# Patient Record
Sex: Male | Born: 1955 | Race: White | Hispanic: No | Smoking: Current every day smoker
Health system: Southern US, Community
[De-identification: ages and names within clinical notes are randomized; demographics above are authoritative.]

## PROBLEM LIST (undated history)

## (undated) DIAGNOSIS — G4730 Sleep apnea, unspecified: Secondary | ICD-10-CM

## (undated) DIAGNOSIS — J449 Chronic obstructive pulmonary disease, unspecified: Secondary | ICD-10-CM

## (undated) DIAGNOSIS — J45909 Unspecified asthma, uncomplicated: Secondary | ICD-10-CM

## (undated) DIAGNOSIS — R011 Cardiac murmur, unspecified: Secondary | ICD-10-CM

## (undated) DIAGNOSIS — I10 Essential (primary) hypertension: Secondary | ICD-10-CM

## (undated) HISTORY — PX: COLONOSCOPY: SHX174

## (undated) HISTORY — PX: KNEE SURGERY: SHX244

## (undated) HISTORY — PX: JOINT REPLACEMENT: SHX530

---

## 2004-12-03 ENCOUNTER — Ambulatory Visit: Payer: Self-pay | Admitting: Occupational Therapy

## 2006-08-26 ENCOUNTER — Encounter: Payer: Self-pay | Admitting: Nurse Practitioner

## 2006-09-12 ENCOUNTER — Encounter: Payer: Self-pay | Admitting: Nurse Practitioner

## 2007-03-01 ENCOUNTER — Ambulatory Visit: Payer: Self-pay | Admitting: Unknown Physician Specialty

## 2007-03-01 ENCOUNTER — Other Ambulatory Visit: Payer: Self-pay

## 2007-03-09 ENCOUNTER — Inpatient Hospital Stay: Payer: Self-pay | Admitting: Unknown Physician Specialty

## 2007-03-25 ENCOUNTER — Encounter: Payer: Self-pay | Admitting: Unknown Physician Specialty

## 2007-04-13 ENCOUNTER — Encounter: Payer: Self-pay | Admitting: Unknown Physician Specialty

## 2007-05-14 ENCOUNTER — Encounter: Payer: Self-pay | Admitting: Unknown Physician Specialty

## 2007-06-14 ENCOUNTER — Encounter: Payer: Self-pay | Admitting: Unknown Physician Specialty

## 2020-10-07 ENCOUNTER — Other Ambulatory Visit: Payer: Self-pay

## 2020-10-07 ENCOUNTER — Observation Stay (HOSPITAL_COMMUNITY)
Admission: EM | Admit: 2020-10-07 | Discharge: 2020-10-08 | Disposition: A | Discharge status: Home / Self Care | Payer: BC Managed Care – PPO | Attending: Internal Medicine | Admitting: Internal Medicine

## 2020-10-07 ENCOUNTER — Emergency Department (HOSPITAL_COMMUNITY): Payer: BC Managed Care – PPO

## 2020-10-07 ENCOUNTER — Encounter (HOSPITAL_COMMUNITY): Payer: Self-pay

## 2020-10-07 DIAGNOSIS — J441 Chronic obstructive pulmonary disease with (acute) exacerbation: Secondary | ICD-10-CM | POA: Diagnosis not present

## 2020-10-07 DIAGNOSIS — J9601 Acute respiratory failure with hypoxia: Secondary | ICD-10-CM | POA: Diagnosis not present

## 2020-10-07 DIAGNOSIS — J45909 Unspecified asthma, uncomplicated: Secondary | ICD-10-CM | POA: Insufficient documentation

## 2020-10-07 DIAGNOSIS — F17210 Nicotine dependence, cigarettes, uncomplicated: Secondary | ICD-10-CM | POA: Insufficient documentation

## 2020-10-07 DIAGNOSIS — J40 Bronchitis, not specified as acute or chronic: Secondary | ICD-10-CM

## 2020-10-07 DIAGNOSIS — J209 Acute bronchitis, unspecified: Secondary | ICD-10-CM

## 2020-10-07 DIAGNOSIS — I82409 Acute embolism and thrombosis of unspecified deep veins of unspecified lower extremity: Secondary | ICD-10-CM | POA: Diagnosis not present

## 2020-10-07 DIAGNOSIS — Z79899 Other long term (current) drug therapy: Secondary | ICD-10-CM | POA: Diagnosis not present

## 2020-10-07 DIAGNOSIS — R059 Cough, unspecified: Secondary | ICD-10-CM | POA: Diagnosis present

## 2020-10-07 DIAGNOSIS — J449 Chronic obstructive pulmonary disease, unspecified: Secondary | ICD-10-CM | POA: Diagnosis present

## 2020-10-07 DIAGNOSIS — J440 Chronic obstructive pulmonary disease with acute lower respiratory infection: Secondary | ICD-10-CM

## 2020-10-07 DIAGNOSIS — I2510 Atherosclerotic heart disease of native coronary artery without angina pectoris: Secondary | ICD-10-CM | POA: Diagnosis not present

## 2020-10-07 DIAGNOSIS — I10 Essential (primary) hypertension: Secondary | ICD-10-CM | POA: Insufficient documentation

## 2020-10-07 DIAGNOSIS — Z20822 Contact with and (suspected) exposure to covid-19: Secondary | ICD-10-CM | POA: Insufficient documentation

## 2020-10-07 HISTORY — DX: Unspecified asthma, uncomplicated: J45.909

## 2020-10-07 HISTORY — DX: Chronic obstructive pulmonary disease, unspecified: J44.9

## 2020-10-07 LAB — BASIC METABOLIC PANEL
Anion gap: 11 (ref 5–15)
BUN: 16 mg/dL (ref 8–23)
CO2: 26 mmol/L (ref 22–32)
Calcium: 9.3 mg/dL (ref 8.9–10.3)
Chloride: 100 mmol/L (ref 98–111)
Creatinine, Ser: 1.16 mg/dL (ref 0.61–1.24)
GFR, Estimated: >60 mL/min (ref >60)
Glucose, Bld: 106 mg/dL — ABNORMAL HIGH (ref 70–99)
Potassium: 4.6 mmol/L (ref 3.5–5.1)
Sodium: 137 mmol/L (ref 135–145)

## 2020-10-07 LAB — CBC WITH DIFFERENTIAL/PLATELET
Abs Immature Granulocytes: 0.15 10*3/uL — ABNORMAL HIGH (ref 0.00–0.07)
Basophils Absolute: 0.1 10*3/uL (ref 0.0–0.1)
Basophils Relative: 0 %
Eosinophils Absolute: 0 10*3/uL (ref 0.0–0.5)
Eosinophils Relative: 0 %
HCT: 43 % (ref 39.0–52.0)
Hemoglobin: 14.1 g/dL (ref 13.0–17.0)
Immature Granulocytes: 1 %
Lymphocytes Relative: 10 %
Lymphs Abs: 2 10*3/uL (ref 0.7–4.0)
MCH: 32.1 pg (ref 26.0–34.0)
MCHC: 32.8 g/dL (ref 30.0–36.0)
MCV: 97.9 fL (ref 80.0–100.0)
Monocytes Absolute: 2.3 10*3/uL — ABNORMAL HIGH (ref 0.1–1.0)
Monocytes Relative: 11 %
Neutro Abs: 15.7 10*3/uL — ABNORMAL HIGH (ref 1.7–7.7)
Neutrophils Relative %: 78 %
Platelets: 425 10*3/uL — ABNORMAL HIGH (ref 150–400)
RBC: 4.39 MIL/uL (ref 4.22–5.81)
RDW: 12.8 % (ref 11.5–15.5)
WBC: 20.2 10*3/uL — ABNORMAL HIGH (ref 4.0–10.5)
nRBC: 0 % (ref 0.0–0.2)

## 2020-10-07 LAB — RESP PANEL BY RT-PCR (FLU A&B, COVID) ARPGX2
Influenza A by PCR: NEGATIVE
Influenza B by PCR: NEGATIVE
SARS Coronavirus 2 by RT PCR: NEGATIVE

## 2020-10-07 LAB — TROPONIN I (HIGH SENSITIVITY)
Troponin I (High Sensitivity): 11 ng/L (ref <18)
Troponin I (High Sensitivity): 11 ng/L (ref <18)

## 2020-10-07 MED ORDER — GUAIFENESIN ER 600 MG PO TB12
1200.0000 mg | ORAL_TABLET | Freq: Two times a day (BID) | ORAL | Status: DC
  Administered 2020-10-07 – 2020-10-08 (×2): 1200 mg via ORAL
  Filled 2020-10-07 (×2): qty 2

## 2020-10-07 MED ORDER — BENAZEPRIL HCL 10 MG PO TABS
40.0000 mg | ORAL_TABLET | Freq: Every day | ORAL | Status: DC
  Administered 2020-10-08: 40 mg via ORAL
  Filled 2020-10-07 (×2): qty 2
  Filled 2020-10-07: qty 4
  Filled 2020-10-07: qty 2

## 2020-10-07 MED ORDER — HYDROCODONE-ACETAMINOPHEN 5-325 MG PO TABS
2.0000 | ORAL_TABLET | Freq: Four times a day (QID) | ORAL | Status: DC | PRN
  Administered 2020-10-07: 2 via ORAL
  Filled 2020-10-07: qty 2

## 2020-10-07 MED ORDER — ALBUTEROL SULFATE HFA 108 (90 BASE) MCG/ACT IN AERS
2.0000 | INHALATION_SPRAY | Freq: Once | RESPIRATORY_TRACT | Status: AC
  Administered 2020-10-07: 2 via RESPIRATORY_TRACT
  Filled 2020-10-07: qty 6.7

## 2020-10-07 MED ORDER — DIAZEPAM 2 MG PO TABS: 2.0000 mg | ORAL_TABLET | Freq: Two times a day (BID) | ORAL | Status: DC | PRN

## 2020-10-07 MED ORDER — PREDNISONE 20 MG PO TABS
40.0000 mg | ORAL_TABLET | Freq: Every day | ORAL | Status: DC
  Administered 2020-10-08: 40 mg via ORAL
  Filled 2020-10-07: qty 2

## 2020-10-07 MED ORDER — ROPINIROLE HCL 0.5 MG PO TABS: 0.5000 mg | ORAL_TABLET | Freq: Every day | ORAL | Status: DC

## 2020-10-07 MED ORDER — IPRATROPIUM-ALBUTEROL 0.5-2.5 (3) MG/3ML IN SOLN
3.0000 mL | Freq: Four times a day (QID) | RESPIRATORY_TRACT | Status: DC
  Administered 2020-10-08: 3 mL via RESPIRATORY_TRACT
  Filled 2020-10-07: qty 3

## 2020-10-07 MED ORDER — ALBUTEROL SULFATE (2.5 MG/3ML) 0.083% IN NEBU: 2.5000 mg | INHALATION_SOLUTION | RESPIRATORY_TRACT | Status: DC | PRN

## 2020-10-07 MED ORDER — DILTIAZEM HCL ER COATED BEADS 300 MG PO CP24
300.0000 mg | ORAL_CAPSULE | Freq: Every day | ORAL | Status: DC
  Administered 2020-10-08: 300 mg via ORAL
  Filled 2020-10-07 (×3): qty 1

## 2020-10-07 MED ORDER — METHYLPREDNISOLONE SODIUM SUCC 125 MG IJ SOLR
125.0000 mg | Freq: Once | INTRAMUSCULAR | Status: AC
  Administered 2020-10-07: 125 mg via INTRAVENOUS
  Filled 2020-10-07: qty 2

## 2020-10-07 MED ORDER — ATORVASTATIN CALCIUM 40 MG PO TABS
40.0000 mg | ORAL_TABLET | Freq: Every day | ORAL | Status: DC
  Administered 2020-10-08: 40 mg via ORAL
  Filled 2020-10-07: qty 1

## 2020-10-07 MED ORDER — NAPROXEN SODIUM 220 MG PO TABS: 440.0000 mg | ORAL_TABLET | Freq: Every day | ORAL | Status: DC | PRN

## 2020-10-07 MED ORDER — HYDROCHLOROTHIAZIDE 25 MG PO TABS
25.0000 mg | ORAL_TABLET | Freq: Every day | ORAL | Status: DC
  Administered 2020-10-08: 25 mg via ORAL
  Filled 2020-10-07: qty 1

## 2020-10-07 MED ORDER — IPRATROPIUM-ALBUTEROL 0.5-2.5 (3) MG/3ML IN SOLN
3.0000 mL | Freq: Once | RESPIRATORY_TRACT | Status: AC
  Administered 2020-10-07: 3 mL via RESPIRATORY_TRACT
  Filled 2020-10-07: qty 3

## 2020-10-07 MED ORDER — SODIUM CHLORIDE 0.9 % IV SOLN: 2.0000 g | INTRAVENOUS | Status: DC

## 2020-10-07 MED ORDER — SODIUM CHLORIDE 0.9 % IV SOLN
1.0000 g | Freq: Once | INTRAVENOUS | Status: DC
  Administered 2020-10-07: 1 g via INTRAVENOUS
  Filled 2020-10-07: qty 10

## 2020-10-07 MED ORDER — ENOXAPARIN SODIUM 60 MG/0.6ML ~~LOC~~ SOLN
50.0000 mg | SUBCUTANEOUS | Status: DC
  Administered 2020-10-07: 50 mg via SUBCUTANEOUS
  Filled 2020-10-07: qty 0.6

## 2020-10-07 MED ORDER — HYDROCORTISONE 2.5 % EX CREA
TOPICAL_CREAM | Freq: Two times a day (BID) | CUTANEOUS | Status: DC
  Filled 2020-10-07: qty 30

## 2020-10-07 MED ORDER — IOHEXOL 350 MG/ML SOLN
75.0000 mL | Freq: Once | INTRAVENOUS | Status: AC | PRN
  Administered 2020-10-07: 100 mL via INTRAVENOUS

## 2020-10-07 MED ORDER — CYCLOBENZAPRINE HCL 10 MG PO TABS: 5.0000 mg | ORAL_TABLET | Freq: Three times a day (TID) | ORAL | Status: DC | PRN

## 2020-10-07 MED ORDER — SODIUM CHLORIDE 0.9 % IV SOLN: 500.0000 mg | INTRAVENOUS | Status: DC

## 2020-10-07 MED ORDER — AZITHROMYCIN 250 MG PO TABS: 500.0000 mg | ORAL_TABLET | Freq: Every day | ORAL | Status: DC

## 2020-10-07 MED ORDER — FLUTICASONE FUROATE-VILANTEROL 100-25 MCG/INH IN AEPB
1.0000 | INHALATION_SPRAY | Freq: Every day | RESPIRATORY_TRACT | Status: DC
  Administered 2020-10-08: 1 via RESPIRATORY_TRACT
  Filled 2020-10-07: qty 28

## 2020-10-07 MED ORDER — SODIUM CHLORIDE 0.9 % IV SOLN
500.0000 mg | INTRAVENOUS | Status: DC
  Administered 2020-10-07: 500 mg via INTRAVENOUS
  Filled 2020-10-07: qty 500

## 2020-10-07 MED ORDER — TRAZODONE HCL 50 MG PO TABS: 50.0000 mg | ORAL_TABLET | Freq: Every day | ORAL | Status: DC

## 2020-10-07 MED ORDER — NAPROXEN 250 MG PO TABS: 500.0000 mg | ORAL_TABLET | Freq: Every day | ORAL | Status: DC | PRN

## 2020-10-07 MED ORDER — ROPINIROLE HCL 0.25 MG PO TABS
0.2500 mg | ORAL_TABLET | Freq: Every day | ORAL | Status: DC
  Administered 2020-10-07: 0.25 mg via ORAL
  Filled 2020-10-07: qty 1

## 2020-10-07 MED ORDER — DOXAZOSIN MESYLATE 2 MG PO TABS
2.0000 mg | ORAL_TABLET | Freq: Every day | ORAL | Status: DC
  Administered 2020-10-08: 2 mg via ORAL
  Filled 2020-10-07: qty 1

## 2020-10-07 MED ORDER — HYDRALAZINE HCL 25 MG PO TABS
25.0000 mg | ORAL_TABLET | Freq: Four times a day (QID) | ORAL | Status: DC | PRN
Start: 2020-10-07 — End: 2020-10-08

## 2020-10-07 NOTE — ED Notes (Signed)
Bed marked ready  Call for report  "that bed hasn't been approved yet" And person hung up

## 2020-10-07 NOTE — ED Provider Notes (Signed)
[facility] Provider Note   CSN: [id number] Arrival date & time: 10/07/20  1001     History Chief Complaint  Patient presents with  . Cough    David GamerDonald L Mian is a 64 y.o. male with past history significant for COPD who presents for evaluation of cough and feeling unwell.  Symptoms began on Wednesday.  States this was also when he got his Covid vaccine booster.  Has had productive cough.  He feels short of breath when he coughs.  Generalized myalgias.  Denies headache, lightheadedness, dizziness, chest pain, hemoptysis abdominal pain, diarrhea, dysuria, lateral leg swelling, redness or warmth.  Denies additional aggravating or alleviating factors.  History obtained from patient and past medical records.  No interpreter used  HPI     Past Medical History:  Diagnosis Date  . Asthma   . COPD (chronic obstructive pulmonary disease) [facility](HCC)     Patient Active Problem List   Diagnosis Date Noted  . COPD with acute bronchitis (HCC) 10/07/2020  . COPD (chronic obstructive pulmonary disease) (HCC) 10/07/2020  . Bronchitis   . Acute respiratory failure with hypoxia (HCC)     History reviewed. No pertinent surgical history.     History reviewed. No pertinent family history.  Social History   Tobacco Use  . Smoking status: Current Every Day Smoker    Packs/day: 2.00    Types: Cigarettes  . Smokeless tobacco: Never Used  Vaping Use  . Vaping Use: Never used  Substance Use Topics  . Alcohol use: Not Currently  . Drug use: Never    Home Medications Prior to Admission medications   Medication Sig Start Date End Date Taking? Authorizing Provider  albuterol (VENTOLIN HFA) 108 (90 Base) MCG/ACT inhaler Inhale 2 puffs into the lungs every 6 (six) hours as needed for shortness of breath.   Yes [provider]  atorvastatin (LIPITOR) 40 MG tablet Take 40 mg by mouth daily.   Yes [provider]  benazepril (LOTENSIN) 40 MG tablet Take 40 mg  by mouth daily. 09/18/20  Yes [provider]  budesonide-formoterol (SYMBICORT) 160-4.5 MCG/ACT inhaler Inhale 2 puffs into the lungs daily as needed (short of breath).   Yes [provider]  cyclobenzaprine (FLEXERIL) 10 MG tablet Take 10 mg by mouth 3 (three) times daily. 09/17/20  Yes [provider]  diltiazem (CARDIZEM CD) 300 MG 24 hr capsule Take 300 mg by mouth daily. 09/18/20  Yes [provider]  hydrochlorothiazide (HYDRODIURIL) 25 MG tablet Take 25 mg by mouth daily.   Yes [provider]  HYDROcodone-acetaminophen (NORCO/VICODIN) 5-325 MG tablet Take 2 tablets by mouth every 6 (six) hours as needed for moderate pain or severe pain. 08/17/20  Yes [provider]  hydrocortisone 2.5 % cream  03/09/18  Yes [provider]  naproxen sodium (ALEVE) 220 MG tablet Take 440 mg by mouth daily as needed.   Yes [provider]  rOPINIRole (REQUIP) 0.25 MG tablet Take 0.75 mg by mouth at bedtime. 09/18/20  Yes [provider]  terazosin (HYTRIN) 10 MG capsule Take 10 mg by mouth daily. 09/26/20  Yes [provider]  traZODone (DESYREL) 50 MG tablet Take 50 mg by mouth at bedtime. 09/26/20  Yes [provider]  Vitamin D, Ergocalciferol, (DRISDOL) 1.25 MG (50000 UNIT) CAPS capsule Take 50,000 Units by mouth 2 (two) times a week. 06/02/20  Yes [provider]  furosemide (LASIX) 20 MG tablet     [provider]  Allergies    Motrin [ibuprofen]  Review of Systems   Review of Systems  Constitutional: Positive for activity change, appetite change and fatigue.  HENT: Negative.   Respiratory: Positive for cough and shortness of breath. Negative for apnea, choking, chest tightness, wheezing and stridor.   Cardiovascular: Negative.   Gastrointestinal: Negative.   Genitourinary: Negative.   Musculoskeletal: Negative.   Skin: Negative.   Neurological: Negative.   All other systems  reviewed and are negative.   Physical Exam Updated Vital Signs BP (!) 164/94 (BP Location: Right Arm)   Pulse 93   Temp 100.1 F (37.8 C) (Oral)   Resp 20   Ht 5' 10.5" (1.791 m)   Wt 106.6 kg   SpO2 94%   BMI 33.24 kg/m   Physical Exam Vitals and nursing note reviewed.  Constitutional:      General: He is not in acute distress.    Appearance: He is not ill-appearing, toxic-appearing or diaphoretic.  HENT:     Head: Normocephalic and atraumatic.     Jaw: There is normal jaw occlusion.     Right Ear: Tympanic membrane, ear canal and external ear normal. There is no impacted cerumen. No hemotympanum. Tympanic membrane is not injected, scarred, perforated, erythematous, retracted or bulging.     Left Ear: Tympanic membrane, ear canal and external ear normal. There is no impacted cerumen. No hemotympanum. Tympanic membrane is not injected, scarred, perforated, erythematous, retracted or bulging.     Ears:     Comments: No Mastoid tenderness.    Nose:     Comments: Clear rhinorrhea and congestion to bilateral nares.  No sinus tenderness.    Mouth/Throat:     Comments: Posterior oropharynx clear.  Mucous membranes moist.  Tonsils without erythema or exudate.  Uvula midline without deviation.  No evidence of PTA or RPA.  No drooling, dysphasia or trismus.  Phonation normal. Neck:     Trachea: Trachea and phonation normal.     Comments: No Neck stiffness or neck rigidity.  No meningismus.  No cervical lymphadenopathy. Cardiovascular:     Rate and Rhythm: Normal rate.     Pulses: Normal pulses.          Radial pulses are 2+ on the right side and 2+ on the left side.     Heart sounds: Normal heart sounds.     Comments: No murmurs rubs or gallops. Pulmonary:     Effort: Pulmonary effort is normal. No accessory muscle usage.     Breath sounds: Wheezing and rhonchi present.     Comments: Coarse breath sounds bilaterally with rhonchi and wheeze no accessory muscle usage.  Able speak in  full sentences. Chest:     Comments: Equal rise and fall to chest wall Abdominal:     Comments: Soft, nontender without rebound or guarding.  No CVA tenderness.  Musculoskeletal:     Cervical back: Full passive range of motion without pain and normal range of motion.     Comments: Moves all 4 extremities without difficulty.  Lower extremities without edema, erythema or warmth.  Skin:    Comments: Brisk capillary refill.  No rashes or lesions.  Neurological:     Mental Status: He is alert.     Comments: Ambulatory in department without difficulty.  Cranial nerves II through XII grossly intact.  No facial droop.  No aphasia.     ED Results / Procedures / Treatments   Labs (all labs ordered are listed, but only abnormal  results are displayed) Labs Reviewed  CBC WITH DIFFERENTIAL/PLATELET - Abnormal; Notable for the following components:      Result Value   WBC 20.2 (*)    Platelets 425 (*)    Neutro Abs 15.7 (*)    Monocytes Absolute 2.3 (*)    Abs Immature Granulocytes 0.15 (*)    All other components within normal limits  BASIC METABOLIC PANEL - Abnormal; Notable for the following components:   Glucose, Bld 106 (*)    All other components within normal limits  RESP PANEL BY RT-PCR (FLU A&B, COVID) ARPGX2  EXPECTORATED SPUTUM ASSESSMENT W REFEX TO RESP CULTURE  BLOOD GAS, VENOUS  BASIC METABOLIC PANEL  CBC  LEGIONELLA PNEUMOPHILA SEROGP 1 UR AG  MYCOPLASMA PNEUMONIAE ANTIBODY, IGM  STREP PNEUMONIAE URINARY ANTIGEN  RAPID URINE DRUG SCREEN, HOSP PERFORMED  TROPONIN I (HIGH SENSITIVITY)  TROPONIN I (HIGH SENSITIVITY)    EKG EKG Interpretation  Date/Time:  Sunday October 07 2020 14:52:30 EST Ventricular Rate:  103 PR Interval:    QRS Duration: 94 QT Interval:  359 QTC Calculation: 425 R Axis:   60 Text Interpretation: Sinus tachycardia Multiple premature complexes, vent & supraven Anteroseptal infarct, old Borderline repolarization abnormality Confirmed by Kennis Carina [phone]) on 10/07/2020 3:00:00 PM   Radiology CT Angio Chest PE W/Cm &/Or Wo Cm  Result Date: 10/07/2020 CLINICAL DATA:  Cough, COPD EXAM: CT ANGIOGRAPHY CHEST WITH CONTRAST TECHNIQUE: Multidetector CT imaging of the chest was performed using the standard protocol during bolus administration of intravenous contrast. Multiplanar CT image reconstructions and MIPs were obtained to evaluate the vascular anatomy. CONTRAST:  OMNIPAQUE IOHEXOL 350 MG/ML SOLN COMPARISON:  None. FINDINGS: Cardiovascular: Examination for pulmonary embolism is somewhat limited by breath motion artifact throughout. Within this limitation, no evidence of pulmonary embolism to the proximal segmental pulmonary arterial level. Normal heart size. Three-vessel coronary artery calcifications. No pericardial effusion. Aortic atherosclerosis. Mediastinum/Nodes: No enlarged mediastinal, hilar, or axillary lymph nodes. Thyroid gland, trachea, and esophagus demonstrate no significant findings. Lungs/Pleura: Examination of the lungs is generally limited by breath motion artifact. Diffuse bilateral bronchial wall thickening. No pleural effusion or pneumothorax. Upper Abdomen: No acute abnormality. Musculoskeletal: No chest wall abnormality. No acute or significant osseous findings. Review of the MIP images confirms the above findings. IMPRESSION: 1. Examination for pulmonary embolism is somewhat limited by breath motion artifact throughout. Within this limitation, no evidence of pulmonary embolism to the proximal segmental pulmonary arterial level. 2. Diffuse bilateral bronchial wall thickening, consistent with nonspecific infectious or inflammatory bronchitis. 3. Coronary artery disease. Aortic Atherosclerosis (ICD10-I70.0). Electronically Signed   By: Lauralyn Primes M.D.   On: 10/07/2020 16:38   DG Chest Portable 1 View  Result Date: 10/07/2020 CLINICAL DATA:  Cough following COVID booster EXAM: PORTABLE CHEST 1 VIEW COMPARISON:   03/01/2007 FINDINGS: Cardiac shadow is within normal limits. Changes of prior granulomatous disease are seen. Lungs are well aerated without focal infiltrate or sizable effusion. No bony abnormality is seen. IMPRESSION: No active disease. Electronically Signed   By: Alcide Clever M.D.   On: 10/07/2020 13:35   Procedures Procedures (including critical care time)  Medications Ordered in ED Medications  azithromycin (ZITHROMAX) 500 mg in sodium chloride 0.9 % 250 mL IVPB (0 mg Intravenous Stopped 10/07/20 1946)  benazepril (LOTENSIN) tablet 40 mg (40 mg Oral Not Given 10/07/20 1829)  diazepam (VALIUM) tablet 2 mg (has no administration in time range)  cyclobenzaprine (FLEXERIL) tablet 5 mg (has no administration in time range)  doxazosin (CARDURA) tablet 2 mg (2 mg Oral Not Given 10/07/20 2102)  traZODone (DESYREL) tablet 50 mg (has no administration in time range)  atorvastatin (LIPITOR) tablet 40 mg (40 mg Oral Not Given 10/07/20 1830)  hydrochlorothiazide (HYDRODIURIL) tablet 25 mg (25 mg Oral Not Given 10/07/20 1830)  predniSONE (DELTASONE) tablet 40 mg (has no administration in time range)  fluticasone furoate-vilanterol (BREO ELLIPTA) 100-25 MCG/INH 1 puff (has no administration in time range)  ipratropium-albuterol (DUONEB) 0.5-2.5 (3) MG/3ML nebulizer solution 3 mL (has no administration in time range)  albuterol (PROVENTIL) (2.5 MG/3ML) 0.083% nebulizer solution 2.5 mg (has no administration in time range)  enoxaparin (LOVENOX) injection 50 mg (50 mg Subcutaneous Given 10/07/20 1912)  hydrALAZINE (APRESOLINE) tablet 25 mg (has no administration in time range)  guaiFENesin (MUCINEX) 12 hr tablet 1,200 mg (has no administration in time range)  cefTRIAXone (ROCEPHIN) 2 g in sodium chloride 0.9 % 100 mL IVPB (has no administration in time range)  rOPINIRole (REQUIP) tablet 0.25 mg (has no administration in time range)  HYDROcodone-acetaminophen (NORCO/VICODIN) 5-325 MG per tablet 2 tablet  (has no administration in time range)  naproxen sodium (ALEVE) tablet 440 mg (has no administration in time range)  diltiazem (CARDIZEM CD) 24 hr capsule 300 mg (has no administration in time range)  hydrocortisone 2.5 % cream (has no administration in time range)  albuterol (VENTOLIN HFA) 108 (90 Base) MCG/ACT inhaler 2 puff (2 puffs Inhalation Given 10/07/20 1459)  methylPREDNISolone sodium succinate (SOLU-MEDROL) 125 mg/2 mL injection 125 mg (125 mg Intravenous Given 10/07/20 1504)  iohexol (OMNIPAQUE) 350 MG/ML injection 75 mL (100 mLs Intravenous Contrast Given 10/07/20 1623)  ipratropium-albuterol (DUONEB) 0.5-2.5 (3) MG/3ML nebulizer solution 3 mL (3 mLs Nebulization Given 10/07/20 1744)    ED Course  I have reviewed the triage vital signs and the nursing notes.  Pertinent labs & imaging results that were available during my care of the patient were reviewed by me and considered in my medical decision making (see chart for details).  Patient presents for evaluation after feeling unwell after getting Covid booster on Wednesday.  On arrival he is afebrile, nonseptic, non-ill-appearing.  Coarse breath sounds with wheeze and rhonchi.  Heart clear.  Abdomen soft, nontender.  Appears well-hydrated.  No unilateral leg swelling, redness or warmth.  Does have remote history of tobacco use, history of COPD.  Plan on labs, imaging and reassess.  Will give steroids, albuterol  Labs and imaging personally reviewed and interpreted:  COVID, Flu negative Chest x-ray without any infiltrates, cardiomegaly, pulmonary edema, pneumothorax CBC with leukocytosis at 20.2 Trop 11>>11 BMP mild hyperglycemia 106 however no additional electrolyte, renal or liver abnormality  Patient reassessed.  Patient dropped to 86% on room air while ambulating short distances.  Has increasing dyspnea.  This is despite steroids as well as multiple albuterol treatments.  Placed DuoNeb orders as Covid is negative.  Given he does  have a white count of 20 and CT scan of his chest does show possible infectious bronchitis will start on antibiotics.  Discussed with patient family in room admission for continued management.  They are agreeable to this  CONSULT with Dr. Chipper Herb with [facility] who will evaluate patient for admission.  The patient appears reasonably stabilized for admission considering the current resources, flow, and capabilities available in the ED at this time, and I doubt any other [facility] requiring further screening and/or treatment in the ED prior to admission.     MDM Rules/Calculators/A&P  [facility] was evaluated in Emergency Department on 10/07/2020 for the symptoms described in the history of present illness. He was evaluated in the context of the global COVID-19 pandemic, which necessitated consideration that the patient might be at risk for infection with the SARS-CoV-2 virus that causes COVID-19. Institutional protocols and algorithms that pertain to the evaluation of patients at risk for COVID-19 are in a state of rapid change based on information released by regulatory bodies including the CDC and federal and state organizations. These policies and algorithms were followed during the patient's care in the ED. Final Clinical Impression(s) / ED Diagnoses Final diagnoses:  Bronchitis  Acute respiratory failure with hypoxia [facility])    Rx / DC Orders ED Discharge Orders    None       Michie Molnar A, PA-C 10/07/20 2144    Vanetta Mulders, MD 10/20/20 2354

## 2020-10-07 NOTE — ED Notes (Signed)
Cough since Weds after having covid vaccine

## 2020-10-07 NOTE — ED Notes (Signed)
Call to [facility] , Almira Coaster, RN  She reports that she will request that this pt be next assigned a bed since it has been requested that pt be moved to Zone A for hours due to FT acuity, covid patients in most rooms and inability to have pt moved to zone a in spite of repeated request

## 2020-10-07 NOTE — ED Notes (Signed)
Pt on O2/2L  He is not on O2 at home   Daughter at bedside

## 2020-10-07 NOTE — ED Notes (Signed)
Report to Bree, RN 

## 2020-10-07 NOTE — ED Notes (Signed)
Call to resp   No answer

## 2020-10-07 NOTE — ED Triage Notes (Signed)
Pt to er, pt states that he got his booster shot on Monday, states that since then he has felt poorly, states that he started having a cough wednesday.

## 2020-10-07 NOTE — ED Notes (Signed)
No meds evidently verified by pharm at this time   Awaiting 1800 meds

## 2020-10-07 NOTE — H&P (Signed)
History and Physical    David Callahan ZOX:[id number] DOB: Jul 12, 1956 DOA: 10/07/2020  PCP: [facility] (Confirm with patient/family/NH records and if not entered, this has to be entered at [facility] point of entry) Patient coming from: Home  I have personally briefly reviewed patient's old medical records in [facility]  Chief Complaint: Wheezing, shortness of breath  HPI: David Callahan is a 64 y.o. male with medical history significant of COPD/asthma, HTN, HLD, BPH, restless leg syndrome, anxiety, presented with increasing shortness of breath.  Patient went to have posterior injection of Covid vaccine on Monday, and then started on Wednesday, he started to feel increasing shortness breath and wheezing.  Occasional whitish sputum coughed up, no fever chills.  No chest pains.  Went to see his PCP on Wednesday, who did outpatient chest x-ray and patient was told negative for pneumonia, and patient sent home without further intervention.  Patient been using more more albuterol pump for last 2 days with minimum help.  Denied any sick contact, other family member healthy.  Also patient reported he quit smoke " cold [place]" 2 weeks ago. ED Course: Chest x-ray negative, CT angiogram negative for PE but diffuse bronchitis-like changes. WBC 20. Review of Systems: As per HPI otherwise 14 point review of systems negative.    Past Medical History:  Diagnosis Date  . Asthma   . COPD (chronic obstructive pulmonary disease) (HCC)     History reviewed. No pertinent surgical history.   reports that he has been smoking cigarettes. He has been smoking about 2.00 packs per day. He has never used smokeless tobacco. He reports previous alcohol use. He reports that he does not use drugs.  Allergies  Allergen Reactions  . Motrin [Ibuprofen] Swelling    History reviewed. No pertinent family history.   Prior to Admission medications   Not on File    Physical Exam: Vitals:    10/07/20 1600 10/07/20 1615 10/07/20 1630 10/07/20 1744  BP: (!) 159/100  (!) 195/101   Pulse: (!) 105 90 96   Resp: 16 (!) 25 19   Temp:      TempSrc:      SpO2: (!) 83% 92% 91% 90%  Weight:      Height:        Constitutional: NAD, calm, comfortable Vitals:   10/07/20 1600 10/07/20 1615 10/07/20 1630 10/07/20 1744  BP: (!) 159/100  (!) 195/101   Pulse: (!) 105 90 96   Resp: 16 (!) 25 19   Temp:      TempSrc:      SpO2: (!) 83% 92% 91% 90%  Weight:      Height:       Eyes: PERRL, lids and conjunctivae normal ENMT: Mucous membranes are moist. Posterior pharynx clear of any exudate or lesions.Normal dentition.  Neck: normal, supple, no masses, no thyromegaly Respiratory: Congested breathing sound bilaterally, diffused wheezing and occasional crackles.  Increasing respiratory effort, talking in broken sentences. No accessory muscle use.  Cardiovascular: Regular rate and rhythm, no murmurs / rubs / gallops. No extremity edema. 2+ pedal pulses. No carotid bruits.  Abdomen: no tenderness, no masses palpated. No hepatosplenomegaly. Bowel sounds positive.  Musculoskeletal: no clubbing / cyanosis. No joint deformity upper and lower extremities. Good ROM, no contractures. Normal muscle tone.  Skin: no rashes, lesions, ulcers. No induration Neurologic: CN 2-12 grossly intact. Sensation intact, DTR normal. Strength 5/5 in all 4.  Psychiatric: Normal judgment and insight. Alert and  oriented x 3. Normal mood.     Labs on Admission: I have personally reviewed following labs and imaging studies  CBC: Recent Labs  Lab 10/07/20 1430  WBC 20.2*  NEUTROABS 15.7*  HGB 14.1  HCT 43.0  MCV 97.9  PLT 425*   Basic Metabolic Panel: Recent Labs  Lab 10/07/20 1430  NA 137  K 4.6  CL 100  CO2 26  GLUCOSE 106*  BUN 16  CREATININE 1.16  CALCIUM 9.3   GFR: Estimated Creatinine Clearance: 80.4 mL/min (by C-G formula based on SCr of 1.16 mg/dL). Liver Function Tests: No results for  input(s): AST, ALT, ALKPHOS, BILITOT, PROT, ALBUMIN in the last 168 hours. No results for input(s): LIPASE, AMYLASE in the last 168 hours. No results for input(s): AMMONIA in the last 168 hours. Coagulation Profile: No results for input(s): INR, PROTIME in the last 168 hours. Cardiac Enzymes: No results for input(s): CKTOTAL, CKMB, CKMBINDEX, TROPONINI in the last 168 hours. BNP (last 3 results) No results for input(s): PROBNP in the last 8760 hours. HbA1C: No results for input(s): HGBA1C in the last 72 hours. CBG: No results for input(s): GLUCAP in the last 168 hours. Lipid Profile: No results for input(s): CHOL, HDL, LDLCALC, TRIG, CHOLHDL, LDLDIRECT in the last 72 hours. Thyroid Function Tests: No results for input(s): TSH, T4TOTAL, FREET4, T3FREE, THYROIDAB in the last 72 hours. Anemia Panel: No results for input(s): VITAMINB12, FOLATE, FERRITIN, TIBC, IRON, RETICCTPCT in the last 72 hours. Urine analysis: No results found for: COLORURINE, APPEARANCEUR, LABSPEC, PHURINE, GLUCOSEU, HGBUR, BILIRUBINUR, KETONESUR, PROTEINUR, UROBILINOGEN, NITRITE, LEUKOCYTESUR  Radiological Exams on Admission: CT Angio Chest PE W/Cm &/Or Wo Cm  Result Date: 10/07/2020 CLINICAL DATA:  Cough, COPD EXAM: CT ANGIOGRAPHY CHEST WITH CONTRAST TECHNIQUE: Multidetector CT imaging of the chest was performed using the standard protocol during bolus administration of intravenous contrast. Multiplanar CT image reconstructions and MIPs were obtained to evaluate the vascular anatomy. CONTRAST:  OMNIPAQUE IOHEXOL 350 MG/ML SOLN COMPARISON:  None. FINDINGS: Cardiovascular: Examination for pulmonary embolism is somewhat limited by breath motion artifact throughout. Within this limitation, no evidence of pulmonary embolism to the proximal segmental pulmonary arterial level. Normal heart size. Three-vessel coronary artery calcifications. No pericardial effusion. Aortic atherosclerosis. Mediastinum/Nodes: No enlarged  mediastinal, hilar, or axillary lymph nodes. Thyroid gland, trachea, and esophagus demonstrate no significant findings. Lungs/Pleura: Examination of the lungs is generally limited by breath motion artifact. Diffuse bilateral bronchial wall thickening. No pleural effusion or pneumothorax. Upper Abdomen: No acute abnormality. Musculoskeletal: No chest wall abnormality. No acute or significant osseous findings. Review of the MIP images confirms the above findings. IMPRESSION: 1. Examination for pulmonary embolism is somewhat limited by breath motion artifact throughout. Within this limitation, no evidence of pulmonary embolism to the proximal segmental pulmonary arterial level. 2. Diffuse bilateral bronchial wall thickening, consistent with nonspecific infectious or inflammatory bronchitis. 3. Coronary artery disease. Aortic Atherosclerosis (ICD10-I70.0). Electronically Signed   By: Lauralyn Primes M.D.   On: 10/07/2020 16:38   DG Chest Portable 1 View  Result Date: 10/07/2020 CLINICAL DATA:  Cough following COVID booster EXAM: PORTABLE CHEST 1 VIEW COMPARISON:  03/01/2007 FINDINGS: Cardiac shadow is within normal limits. Changes of prior granulomatous disease are seen. Lungs are well aerated without focal infiltrate or sizable effusion. No bony abnormality is seen. IMPRESSION: No active disease. Electronically Signed   By: Alcide Clever M.D.   On: 10/07/2020 13:35    EKG: Independently reviewed.  Sinus with frequent PACs  Assessment/Plan Active  Problems:   COPD with acute bronchitis (HCC)   COPD (chronic obstructive pulmonary disease) (HCC)  (please populate well all problems here in Problem List. (For example, if patient is on BP meds at home and you resume or decide to hold them, it is a problem that needs to be her. Same for CAD, COPD, HLD and so on)  CAP -Start ceftriaxone and Zithromax -Repeat CBC tomorrow, check strep antigen, mycoplasma and Legionella.  Acute COPD exacerbation -Antibiotics as  above -Inhaled steroid and short course p.o. steroid -Continue home breathing meds, plus DuoNeb and Advair -Outpatient lung function test.  Outpatient sleep study  Acute hypoxic respite failure -Secondary to CAP and COPD exacerbation -Repeat ambulatory pulse ox tomorrow if stable consider discharge home on p.o. antibiotics and additional breathing meds.  Uncontrolled hypertension -Restart home BP meds -As needed hydralazine -UDS  CAD -Accidental finding on CT angiogram today showed multiple calcification of coronary arteries, recommend outpatient stress test.  Depression/anxiety -Cut down Valium dosage, continue trazodone  DVT prophylaxis: Lovenox  code Status: Full code Family Communication: Daughter at bedside Disposition Plan: Expect less than 2 midnight hospital stay, once oxygenation stabilized, patient can be discharged home. Consults called: None Admission status: MedSurg observation   Emeline General MD [facility] Pager [phone]  10/07/2020, 6:19 PM

## 2020-10-07 NOTE — ED Notes (Signed)
Pt ambulated in hall   sats 86-88 percent RA while ambulating   Pt with increasing dyspnea

## 2020-10-07 NOTE — ED Notes (Signed)
To Rad 

## 2020-10-08 DIAGNOSIS — J449 Chronic obstructive pulmonary disease, unspecified: Secondary | ICD-10-CM | POA: Diagnosis not present

## 2020-10-08 LAB — BLOOD GAS, VENOUS
Acid-Base Excess: 2.6 mmol/L — ABNORMAL HIGH (ref 0.0–2.0)
Bicarbonate: 26.1 mmol/L (ref 20.0–28.0)
FIO2: 28
O2 Saturation: 88.6 %
Patient temperature: 37.8
pCO2, Ven: 46.8 mmHg (ref 44.0–60.0)
pH, Ven: 7.384 (ref 7.250–7.430)
pO2, Ven: 65.2 mmHg — ABNORMAL HIGH (ref 32.0–45.0)

## 2020-10-08 LAB — CBC
HCT: 39.3 % (ref 39.0–52.0)
Hemoglobin: 12.8 g/dL — ABNORMAL LOW (ref 13.0–17.0)
MCH: 32.1 pg (ref 26.0–34.0)
MCHC: 32.6 g/dL (ref 30.0–36.0)
MCV: 98.5 fL (ref 80.0–100.0)
Platelets: 435 10*3/uL — ABNORMAL HIGH (ref 150–400)
RBC: 3.99 MIL/uL — ABNORMAL LOW (ref 4.22–5.81)
RDW: 12.7 % (ref 11.5–15.5)
WBC: 17.5 10*3/uL — ABNORMAL HIGH (ref 4.0–10.5)
nRBC: 0 % (ref 0.0–0.2)

## 2020-10-08 LAB — BASIC METABOLIC PANEL
Anion gap: 11 (ref 5–15)
BUN: 20 mg/dL (ref 8–23)
CO2: 26 mmol/L (ref 22–32)
Calcium: 9.4 mg/dL (ref 8.9–10.3)
Chloride: 101 mmol/L (ref 98–111)
Creatinine, Ser: 1.11 mg/dL (ref 0.61–1.24)
GFR, Estimated: >60 mL/min (ref >60)
Glucose, Bld: 160 mg/dL — ABNORMAL HIGH (ref 70–99)
Potassium: 3.7 mmol/L (ref 3.5–5.1)
Sodium: 138 mmol/L (ref 135–145)

## 2020-10-08 MED ORDER — PREDNISONE 20 MG PO TABS: 40.0000 mg | ORAL_TABLET | Freq: Every day | ORAL | 0 refills | Status: AC

## 2020-10-08 MED ORDER — COMBIVENT RESPIMAT 20-100 MCG/ACT IN AERS: 1.0000 | INHALATION_SPRAY | Freq: Four times a day (QID) | RESPIRATORY_TRACT | 1 refills | Status: DC | PRN

## 2020-10-08 MED ORDER — IPRATROPIUM-ALBUTEROL 0.5-2.5 (3) MG/3ML IN SOLN
3.0000 mL | Freq: Two times a day (BID) | RESPIRATORY_TRACT | Status: DC
  Administered 2020-10-08: 3 mL via RESPIRATORY_TRACT
  Filled 2020-10-08: qty 3

## 2020-10-08 MED ORDER — GUAIFENESIN ER 600 MG PO TB12: 1200.0000 mg | ORAL_TABLET | Freq: Two times a day (BID) | ORAL | 0 refills | Status: AC

## 2020-10-08 MED ORDER — CEFDINIR 300 MG PO CAPS: 300.0000 mg | ORAL_CAPSULE | Freq: Two times a day (BID) | ORAL | 0 refills | Status: AC

## 2020-10-08 MED ORDER — AZITHROMYCIN 250 MG PO TABS: ORAL_TABLET | ORAL | 0 refills | Status: AC

## 2020-10-08 NOTE — Progress Notes (Signed)
Nsg Discharge Note  Admit Date:  10/07/2020 Discharge date: 10/08/2020   David Callahan to be D/C'd Home per MD order.  AVS completed.  Copy for chart, and copy for patient signed, and dated. Patient/caregiver able to verbalize understanding. IV removed. Discharge paper work given and reviewed with patient. Patient had no questions. Work note given. Patient stable upon discharge patient.   Discharge Medication: Allergies as of 10/08/2020      Reactions   Motrin [ibuprofen] Swelling      Medication List    TAKE these medications   albuterol 108 (90 Base) MCG/ACT inhaler Commonly known as: VENTOLIN HFA Inhale 2 puffs into the lungs every 6 (six) hours as needed for shortness of breath.   atorvastatin 40 MG tablet Commonly known as: LIPITOR Take 40 mg by mouth daily.   azithromycin 250 MG tablet Commonly known as: Zithromax Z-Pak Take 2 tablets (500 mg) on  Day 1,  followed by 1 tablet (250 mg) once daily on Days 2 through 5.   benazepril 40 MG tablet Commonly known as: LOTENSIN Take 40 mg by mouth daily.   budesonide-formoterol 160-4.5 MCG/ACT inhaler Commonly known as: SYMBICORT Inhale 2 puffs into the lungs daily as needed (short of breath).   cefdinir 300 MG capsule Commonly known as: OMNICEF Take 1 capsule (300 mg total) by mouth 2 (two) times daily for 5 days.   Combivent Respimat 20-100 MCG/ACT Aers respimat Generic drug: Ipratropium-Albuterol Inhale 1 puff into the lungs every 6 (six) hours as needed for wheezing.   cyclobenzaprine 10 MG tablet Commonly known as: FLEXERIL Take 10 mg by mouth 3 (three) times daily.   diltiazem 300 MG 24 hr capsule Commonly known as: CARDIZEM CD Take 300 mg by mouth daily.   furosemide 20 MG tablet Commonly known as: LASIX   guaiFENesin 600 MG 12 hr tablet Commonly known as: MUCINEX Take 2 tablets (1,200 mg total) by mouth 2 (two) times daily for 5 days.   hydrochlorothiazide 25 MG tablet Commonly known as:  HYDRODIURIL Take 25 mg by mouth daily.   HYDROcodone-acetaminophen 5-325 MG tablet Commonly known as: NORCO/VICODIN Take 2 tablets by mouth every 6 (six) hours as needed for moderate pain or severe pain.   hydrocortisone 2.5 % cream   naproxen sodium 220 MG tablet Commonly known as: ALEVE Take 440 mg by mouth daily as needed.   predniSONE 20 MG tablet Commonly known as: DELTASONE Take 2 tablets (40 mg total) by mouth daily with breakfast for 5 days. Start taking on: October 09, 2020   rOPINIRole 0.25 MG tablet Commonly known as: REQUIP Take 0.75 mg by mouth at bedtime.   terazosin 10 MG capsule Commonly known as: HYTRIN Take 10 mg by mouth daily.   traZODone 50 MG tablet Commonly known as: DESYREL Take 50 mg by mouth at bedtime.   Vitamin D (Ergocalciferol) 1.25 MG (50000 UNIT) Caps capsule Commonly known as: DRISDOL Take 50,000 Units by mouth 2 (two) times a week.       Discharge Assessment: Vitals:   10/08/20 0931 10/08/20 1016  BP: 138/74 139/80  Pulse: 80 94  Resp:  18  Temp:  (!) 97.5 F (36.4 C)  SpO2:  92%   Skin clean, dry and intact without evidence of skin break down, no evidence of skin tears noted. IV catheter discontinued intact. Site without signs and symptoms of complications - no redness or edema noted at insertion site, patient denies c/o pain - only slight tenderness at site.  Dressing with slight pressure applied.  D/c Instructions-Education: Discharge instructions given to patient/family with verbalized understanding. D/c education completed with patient/family including follow up instructions, medication list, d/c activities limitations if indicated, with other d/c instructions as indicated by MD - patient able to verbalize understanding, all questions fully answered. Patient instructed to return to ED, call 911, or call MD for any changes in condition.  Patient escorted via WC, and D/C home via private auto.  Lonn Georgia, RN 10/08/2020  11:25 AM

## 2020-10-08 NOTE — Discharge Summary (Addendum)
Physician Discharge Summary  David Callahan YTK:[id number] DOB: [birth year] DOA: 10/07/2020  PCP: [facility]  Admit date: 10/07/2020  Discharge date: 10/08/2020  Admitted From:Home  Disposition:  Home  Recommendations for Outpatient Follow-up:  1. Follow up with PCP in 1-2 weeks 2. Follow-up with pulmonology outpatient is recommended to evaluate COPD 3. Remain on prednisone as prescribed for several more days as well as antibiotics with azithromycin and Omnicef for treatment of pneumonia 4. Use home inhaler as needed for shortness of breath or wheezing  Home Health: None  Equipment/Devices: None  Discharge Condition:Stable  CODE STATUS: Full  Diet recommendation: Heart Healthy  Brief/Interim Summary: Per HPI: David Callahan is a 64 y.o. male with medical history significant of COPD/asthma, HTN, HLD, BPH, restless leg syndrome, anxiety, presented with increasing shortness of breath.  Patient went to have posterior injection of Covid vaccine on Monday, and then started on Wednesday, he started to feel increasing shortness breath and wheezing.  Occasional whitish sputum coughed up, no fever chills.  No chest pains.  Went to see his PCP on Wednesday, who did outpatient chest x-ray and patient was told negative for pneumonia, and patient sent home without further intervention.  Patient been using more more albuterol pump for last 2 days with minimum help.  Denied any sick contact, other family member healthy.  Also patient reported he quit smoke " cold [place]" 2 weeks ago.  -Patient was admitted with acute COPD exacerbation in the setting of community-acquired pneumonia and was started on breathing treatments as well as steroids and Rocephin and Zithromax.  He is doing much better this morning and denies any further shortness of breath, wheezing, or coughing.  He is stable for discharge and would like to go home today.  He has been ambulated and does not appear  to require any home oxygen.  He will remain on medications as prescribed below and follow-up with pulmonology in the near future as recommended.  Discharge Diagnoses:  Active Problems:   COPD with acute bronchitis (HCC)   COPD (chronic obstructive pulmonary disease) (HCC)  Principal discharge diagnosis: Acute COPD exacerbation in the setting of community-acquired pneumonia.  Discharge Instructions  Discharge Instructions    Ambulatory referral to Pulmonology   Complete by: As directed    Reason for referral: Asthma/COPD   Diet - low sodium heart healthy   Complete by: As directed    Increase activity slowly   Complete by: As directed      Allergies as of 10/08/2020      Reactions   Motrin [ibuprofen] Swelling      Medication List    TAKE these medications   albuterol 108 (90 Base) MCG/ACT inhaler Commonly known as: VENTOLIN HFA Inhale 2 puffs into the lungs every 6 (six) hours as needed for shortness of breath.   atorvastatin 40 MG tablet Commonly known as: LIPITOR Take 40 mg by mouth daily.   azithromycin 250 MG tablet Commonly known as: Zithromax Z-Pak Take 2 tablets (500 mg) on  Day 1,  followed by 1 tablet (250 mg) once daily on Days 2 through 5.   benazepril 40 MG tablet Commonly known as: LOTENSIN Take 40 mg by mouth daily.   budesonide-formoterol 160-4.5 MCG/ACT inhaler Commonly known as: SYMBICORT Inhale 2 puffs into the lungs daily as needed (short of breath).   cefdinir 300 MG capsule Commonly known as: OMNICEF Take 1 capsule (300 mg total) by mouth 2 (two) times daily for 5  days.   Combivent Respimat 20-100 MCG/ACT Aers respimat Generic drug: Ipratropium-Albuterol Inhale 1 puff into the lungs every 6 (six) hours as needed for wheezing.   cyclobenzaprine 10 MG tablet Commonly known as: FLEXERIL Take 10 mg by mouth 3 (three) times daily.   diltiazem 300 MG 24 hr capsule Commonly known as: CARDIZEM CD Take 300 mg by mouth daily.   furosemide 20  MG tablet Commonly known as: LASIX   guaiFENesin 600 MG 12 hr tablet Commonly known as: MUCINEX Take 2 tablets (1,200 mg total) by mouth 2 (two) times daily for 5 days.   hydrochlorothiazide 25 MG tablet Commonly known as: HYDRODIURIL Take 25 mg by mouth daily.   HYDROcodone-acetaminophen 5-325 MG tablet Commonly known as: NORCO/VICODIN Take 2 tablets by mouth every 6 (six) hours as needed for moderate pain or severe pain.   hydrocortisone 2.5 % cream   naproxen sodium 220 MG tablet Commonly known as: ALEVE Take 440 mg by mouth daily as needed.   predniSONE 20 MG tablet Commonly known as: DELTASONE Take 2 tablets (40 mg total) by mouth daily with breakfast for 5 days. Start taking on: October 09, 2020   rOPINIRole 0.25 MG tablet Commonly known as: REQUIP Take 0.75 mg by mouth at bedtime.   terazosin 10 MG capsule Commonly known as: HYTRIN Take 10 mg by mouth daily.   traZODone 50 MG tablet Commonly known as: DESYREL Take 50 mg by mouth at bedtime.   Vitamin D (Ergocalciferol) 1.25 MG (50000 UNIT) Caps capsule Commonly known as: DRISDOL Take 50,000 Units by mouth 2 (two) times a week.       Follow-up Information    [facility] Follow up in 1 week(s).   Contact information: [address] [place] [address] [phone]              Allergies  Allergen Reactions  . Motrin [Ibuprofen] Swelling    Consultations:  None   Procedures/Studies: CT Angio Chest PE W/Cm &/Or Wo Cm  Result Date: 10/07/2020 CLINICAL DATA:  Cough, COPD EXAM: CT ANGIOGRAPHY CHEST WITH CONTRAST TECHNIQUE: Multidetector CT imaging of the chest was performed using the standard protocol during bolus administration of intravenous contrast. Multiplanar CT image reconstructions and MIPs were obtained to evaluate the vascular anatomy. CONTRAST:  OMNIPAQUE IOHEXOL 350 MG/ML SOLN COMPARISON:  None. FINDINGS: Cardiovascular: Examination for pulmonary  embolism is somewhat limited by breath motion artifact throughout. Within this limitation, no evidence of pulmonary embolism to the proximal segmental pulmonary arterial level. Normal heart size. Three-vessel coronary artery calcifications. No pericardial effusion. Aortic atherosclerosis. Mediastinum/Nodes: No enlarged mediastinal, hilar, or axillary lymph nodes. Thyroid gland, trachea, and esophagus demonstrate no significant findings. Lungs/Pleura: Examination of the lungs is generally limited by breath motion artifact. Diffuse bilateral bronchial wall thickening. No pleural effusion or pneumothorax. Upper Abdomen: No acute abnormality. Musculoskeletal: No chest wall abnormality. No acute or significant osseous findings. Review of the MIP images confirms the above findings. IMPRESSION: 1. Examination for pulmonary embolism is somewhat limited by breath motion artifact throughout. Within this limitation, no evidence of pulmonary embolism to the proximal segmental pulmonary arterial level. 2. Diffuse bilateral bronchial wall thickening, consistent with nonspecific infectious or inflammatory bronchitis. 3. Coronary artery disease. Aortic Atherosclerosis (ICD10-I70.0). Electronically Signed   By: Lauralyn Primes M.D.   On: 10/07/2020 16:38   DG Chest Portable 1 View  Result Date: 10/07/2020 CLINICAL DATA:  Cough following COVID booster EXAM: PORTABLE CHEST 1 VIEW COMPARISON:  03/01/2007 FINDINGS: Cardiac shadow is within normal limits. Changes of prior granulomatous disease are seen. Lungs are well aerated without focal infiltrate or sizable effusion. No bony abnormality is seen. IMPRESSION: No active disease. Electronically Signed   By: Alcide Clever M.D.   On: 10/07/2020 13:35     Discharge Exam: Vitals:   10/08/20 0931 10/08/20 1016  BP: 138/74 139/80  Pulse: 80 94  Resp:  18  Temp:  (!) 97.5 F (36.4 C)  SpO2:  92%   Vitals:   10/08/20 0806 10/08/20 0922 10/08/20 0931 10/08/20 1016  BP:   138/74  139/80  Pulse:   80 94  Resp:    18  Temp:    (!) 97.5 F (36.4 C)  TempSrc:    Oral  SpO2: (!) 89% 91%  92%  Weight:      Height:        General: Pt is alert, awake, not in acute distress Cardiovascular: RRR, S1/S2 +, no rubs, no gallops Respiratory: CTA bilaterally, no wheezing, no rhonchi Abdominal: Soft, NT, ND, bowel sounds + Extremities: no edema, no cyanosis    The results of significant diagnostics from this hospitalization (including imaging, microbiology, ancillary and laboratory) are listed below for reference.     Microbiology: Recent Results (from the past 240 hour(s))  Resp Panel by RT-PCR (Flu A&B, Covid) Nasopharyngeal Swab     Status: None   Collection Time: 10/07/20 11:17 AM   Specimen: Nasopharyngeal Swab; Nasopharyngeal(NP) swabs in vial transport medium  Result Value Ref Range Status   SARS Coronavirus 2 by RT PCR NEGATIVE NEGATIVE Final    Comment: (NOTE) SARS-CoV-2 target nucleic acids are NOT DETECTED.  The SARS-CoV-2 RNA is generally detectable in upper respiratory specimens during the acute phase of infection. The lowest concentration of SARS-CoV-2 viral copies this assay can detect is 138 copies/mL. A negative result does not preclude SARS-Cov-2 infection and should not be used as the sole basis for treatment or other patient management decisions. A negative result may occur with  improper specimen collection/handling, submission of specimen other than nasopharyngeal swab, presence of viral mutation(s) within the areas targeted by this assay, and inadequate number of viral copies(<138 copies/mL). A negative result must be combined with clinical observations, patient history, and epidemiological information. The expected result is Negative.  Fact Sheet for Patients:  BloggerCourse.com  Fact Sheet for Healthcare Providers:  SeriousBroker.it  This test is no t yet approved or cleared by the  [place] FDA and  has been authorized for detection and/or diagnosis of SARS-CoV-2 by FDA under an Emergency Use Authorization (EUA). This EUA will remain  in effect (meaning this test can be used) for the duration of the COVID-19 declaration under Section 564(b)(1) of the Act, 21 U.S.C.section 360bbb-3(b)(1), unless the authorization is terminated  or revoked sooner.       Influenza A by PCR NEGATIVE NEGATIVE Final   Influenza B by PCR NEGATIVE NEGATIVE Final    Comment: (NOTE) The Xpert Xpress SARS-CoV-2/FLU/RSV plus assay is intended as an aid in the diagnosis of influenza from Nasopharyngeal swab specimens and should not be used as a sole basis for treatment. Nasal washings and aspirates are unacceptable for Xpert Xpress SARS-CoV-2/FLU/RSV testing.  Fact Sheet for Patients: BloggerCourse.com  Fact Sheet for Healthcare Providers: SeriousBroker.it  This test is not yet approved or cleared by the [place] FDA and has been authorized for detection and/or diagnosis of SARS-CoV-2 by FDA under an Emergency Use Authorization (EUA). This  EUA will remain in effect (meaning this test can be used) for the duration of the COVID-19 declaration under Section 564(b)(1) of the Act, 21 U.S.C. section 360bbb-3(b)(1), unless the authorization is terminated or revoked.  Performed at [facility], [address] [place], [place] [id number]      Labs: BNP (last 3 results) No results for input(s): BNP in the last 8760 hours. Basic Metabolic Panel: Recent Labs  Lab 10/07/20 1430 10/08/20 0411  NA 137 138  K 4.6 3.7  CL 100 101  CO2 26 26  GLUCOSE 106* 160*  BUN 16 20  CREATININE 1.16 1.11  CALCIUM 9.3 9.4   Liver Function Tests: No results for input(s): AST, ALT, ALKPHOS, BILITOT, PROT, ALBUMIN in the last 168 hours. No results for input(s): LIPASE, AMYLASE in the last 168 hours. No results for input(s): AMMONIA in the last  168 hours. CBC: Recent Labs  Lab 10/07/20 1430 10/08/20 0411  WBC 20.2* 17.5*  NEUTROABS 15.7*  --   HGB 14.1 12.8*  HCT 43.0 39.3  MCV 97.9 98.5  PLT 425* 435*   Cardiac Enzymes: No results for input(s): CKTOTAL, CKMB, CKMBINDEX, TROPONINI in the last 168 hours. BNP: Invalid input(s): POCBNP CBG: No results for input(s): GLUCAP in the last 168 hours. D-Dimer No results for input(s): DDIMER in the last 72 hours. Hgb A1c No results for input(s): HGBA1C in the last 72 hours. Lipid Profile No results for input(s): CHOL, HDL, LDLCALC, TRIG, CHOLHDL, LDLDIRECT in the last 72 hours. Thyroid function studies No results for input(s): TSH, T4TOTAL, T3FREE, THYROIDAB in the last 72 hours.  Invalid input(s): FREET3 Anemia work up No results for input(s): VITAMINB12, FOLATE, FERRITIN, TIBC, IRON, RETICCTPCT in the last 72 hours. Urinalysis No results found for: COLORURINE, APPEARANCEUR, LABSPEC, PHURINE, GLUCOSEU, HGBUR, BILIRUBINUR, KETONESUR, PROTEINUR, UROBILINOGEN, NITRITE, LEUKOCYTESUR Sepsis Labs Invalid input(s): PROCALCITONIN,  WBC,  LACTICIDVEN Microbiology Recent Results (from the past 240 hour(s))  Resp Panel by RT-PCR (Flu A&B, Covid) Nasopharyngeal Swab     Status: None   Collection Time: 10/07/20 11:17 AM   Specimen: Nasopharyngeal Swab; Nasopharyngeal(NP) swabs in vial transport medium  Result Value Ref Range Status   SARS Coronavirus 2 by RT PCR NEGATIVE NEGATIVE Final    Comment: (NOTE) SARS-CoV-2 target nucleic acids are NOT DETECTED.  The SARS-CoV-2 RNA is generally detectable in upper respiratory specimens during the acute phase of infection. The lowest concentration of SARS-CoV-2 viral copies this assay can detect is 138 copies/mL. A negative result does not preclude SARS-Cov-2 infection and should not be used as the sole basis for treatment or other patient management decisions. A negative result may occur with  improper specimen collection/handling,  submission of specimen other than nasopharyngeal swab, presence of viral mutation(s) within the areas targeted by this assay, and inadequate number of viral copies(<138 copies/mL). A negative result must be combined with clinical observations, patient history, and epidemiological information. The expected result is Negative.  Fact Sheet for Patients:  BloggerCourse.comhttps://www.fda.gov/media/152166/download  Fact Sheet for Healthcare Providers:  SeriousBroker.ithttps://www.fda.gov/media/152162/download  This test is no t yet approved or cleared by the [place] FDA and  has been authorized for detection and/or diagnosis of SARS-CoV-2 by FDA under an Emergency Use Authorization (EUA). This EUA will remain  in effect (meaning this test can be used) for the duration of the COVID-19 declaration under Section 564(b)(1) of the Act, 21 U.S.C.section 360bbb-3(b)(1), unless the authorization is terminated  or revoked sooner.       Influenza A by  PCR NEGATIVE NEGATIVE Final   Influenza B by PCR NEGATIVE NEGATIVE Final    Comment: (NOTE) The Xpert Xpress SARS-CoV-2/FLU/RSV plus assay is intended as an aid in the diagnosis of influenza from Nasopharyngeal swab specimens and should not be used as a sole basis for treatment. Nasal washings and aspirates are unacceptable for Xpert Xpress SARS-CoV-2/FLU/RSV testing.  Fact Sheet for Patients: BloggerCourse.com  Fact Sheet for Healthcare Providers: SeriousBroker.it  This test is not yet approved or cleared by the [place] FDA and has been authorized for detection and/or diagnosis of SARS-CoV-2 by FDA under an Emergency Use Authorization (EUA). This EUA will remain in effect (meaning this test can be used) for the duration of the COVID-19 declaration under Section 564(b)(1) of the Act, 21 U.S.C. section 360bbb-3(b)(1), unless the authorization is terminated or revoked.  Performed at [facility], [address], [place], [place] [address]      Time coordinating discharge: 35 minutes  SIGNED:   Erick Blinks, DO [facility] 10/08/2020, 11:10 AM  If 7PM-7AM, please contact night-coverage [email or web address]

## 2020-10-08 NOTE — Progress Notes (Signed)
OT Cancellation Note  Patient Details Name: David Callahan MRN: [id number] DOB: 1955-12-05   Cancelled Treatment:    Reason Eval/Treat Not Completed: OT screened, no needs identified, will sign off. Pt performing ADLs and mobility tasks independently. No further OT services required at this time.   Ezra Sites, OTR/L  [phone] 10/08/2020, 8:18 AM

## 2020-10-08 NOTE — Progress Notes (Signed)
SATURATION QUALIFICATIONS: (This note is used to comply with regulatory documentation for home oxygen)  Patient Saturations on Room Air at Rest = 94%  Patient Saturations on Room Air while Ambulating = 93%  Patient Saturations on 0 Liters of oxygen while Ambulating = 93%  Please briefly explain why patient needs home oxygen: 

## 2020-10-08 NOTE — Evaluation (Signed)
Physical Therapy Evaluation Patient Details Name: David Callahan MRN: [id number] DOB: [birth year] Today's Date: 10/08/2020   History of Present Illness  David Callahan is a 64 y.o. male with medical history significant of COPD/asthma, HTN, HLD, BPH, restless leg syndrome, anxiety, presented with increasing shortness of breath.  Patient went to have posterior injection of Covid vaccine on Monday, and then started on Wednesday, he started to feel increasing shortness breath and wheezing.  Occasional whitish sputum coughed up, no fever chills.  No chest pains.  Went to see his PCP on Wednesday, who did outpatient chest x-ray and patient was told negative for pneumonia, and patient sent home without further intervention.  Patient been using more more albuterol pump for last 2 days with minimum help.  Denied any sick contact, other family member healthy.  Also patient reported he quit smoke " cold [place]" 2 weeks ago.    Clinical Impression  Patient functioning near baseline for functional mobility and gait, demonstrates good return for ambulation in room and hallway without loss of balance while on room air with SpO2 at 94%.  Plan:  Patient discharged from physical therapy to care of nursing for ambulation daily as tolerated for length of stay.     Follow Up Recommendations No PT follow up    Equipment Recommendations  None recommended by PT    Recommendations for Other Services       Precautions / Restrictions Precautions Precautions: None Restrictions Weight Bearing Restrictions: No      Mobility  Bed Mobility Overal bed mobility: Independent                  Transfers Overall transfer level: Independent                  Ambulation/Gait Ambulation/Gait assistance: Modified independent (Device/Increase time) Gait Distance (Feet): 200 Feet   Gait Pattern/deviations: WFL(Within Functional Limits) Gait velocity: slightly decreased   General Gait Details:  demonstrates good return for ambulation in room/hallway without loss of balance while on room air with SpO2 at 94%  Stairs            Wheelchair Mobility    Modified Rankin (Stroke Patients Only)       Balance Overall balance assessment: No apparent balance deficits (not formally assessed)                                           Pertinent Vitals/Pain Pain Assessment: No/denies pain    Home Living Family/patient expects to be discharged to:: Private residence Living Arrangements: Children Available Help at Discharge: Family;Available PRN/intermittently Type of Home: House Home Access: Stairs to enter Entrance Stairs-Rails: Right;Left Entrance Stairs-Number of Steps: 3-4 Home Layout: One level;Laundry or work area in [employer]: None      Prior Function Level of Independence: Independent         Comments: Tourist information centre manager, drives, works     Higher education careers adviser        Extremity/Trunk Assessment   Upper Extremity Assessment Upper Extremity Assessment: Defer to OT evaluation    Lower Extremity Assessment Lower Extremity Assessment: Overall WFL for tasks assessed    Cervical / Trunk Assessment Cervical / Trunk Assessment: Normal  Communication   Communication: No difficulties  Cognition Arousal/Alertness: Awake/alert Behavior During Therapy: WFL for tasks assessed/performed Overall Cognitive Status: Within Functional Limits for tasks assessed  General Comments      Exercises     Assessment/Plan    PT Assessment Patent does not need any further PT services  PT Problem List         PT Treatment Interventions      PT Goals (Current goals can be found in the Care Plan section)  Acute Rehab PT Goals Patient Stated Goal: return home PT Goal Formulation: With patient Time For Goal Achievement: 10/08/20 Potential to Achieve Goals: Good    Frequency      Barriers to discharge        Co-evaluation               AM-PAC PT "6 Clicks" Mobility  Outcome Measure Help needed turning from your back to your side while in a flat bed without using bedrails?: None Help needed moving from lying on your back to sitting on the side of a flat bed without using bedrails?: None Help needed moving to and from a bed to a chair (including a wheelchair)?: None Help needed standing up from a chair using your arms (e.g., wheelchair or bedside chair)?: None Help needed to walk in hospital room?: None Help needed climbing 3-5 steps with a railing? : None 6 Click Score: 24    End of Session   Activity Tolerance: Patient tolerated treatment well Patient left: in bed;with call bell/phone within reach Nurse Communication: Mobility status PT Visit Diagnosis: Unsteadiness on feet (R26.81);Other abnormalities of gait and mobility (R26.89);Muscle weakness (generalized) (M62.81)    Time: [id number] PT Time Calculation (min) (ACUTE ONLY): 22 min   Charges:   PT Evaluation $PT Eval Moderate Complexity: 1 Mod PT Treatments $Therapeutic Activity: 8-22 mins        9:57 AM, 10/08/20 Ocie Bob, MPT Physical Therapist with [facility] [phone] office [phone] mobile phone

## 2020-10-09 LAB — MYCOPLASMA PNEUMONIAE ANTIBODY, IGM: Mycoplasma pneumo IgM: <770 U/mL (ref 0–769)

## 2022-03-06 ENCOUNTER — Encounter (HOSPITAL_COMMUNITY): Payer: BC Managed Care – PPO | Admitting: Hematology

## 2022-03-24 NOTE — Progress Notes (Signed)
David Callahan, [place] [address]   CLINIC:  Medical Oncology/Hematology  CONSULT NOTE  Patient Care Team: [facility] as PCP - General  CHIEF COMPLAINTS/PURPOSE OF CONSULTATION:  Evaluation of leukocytosis  HISTORY OF PRESENTING ILLNESS:  David Callahan 66 y.o. male is here at the request of his primary care provider (FNP Angelina Ok) for evaluation of leukocytosis.  Labs sent by PCP as follows: (07/16/2021): Normal CBC without leukocytosis (WBC 8.9) or thrombocytosis (10/24/2021): WBC 13.1, ANC 9.2, monocytes 1.0 (12/26/2021): WBC 13.6, ANC 9.1, monocytes 1.1 (01/28/2022): WBC 15.1, ANC 10.1, monocytes 1.0 + platelets 470  Previous labs available in electronic medical record significant for leukocytosis and thrombocytosis in December 2021, although this occurred during hospitalization for COPD exacerbation and in the setting of steroid use. Patient reports that he has had white blood cells that "go up and down" for the past 10+ years.  He was hospitalized [facility]) with pneumonia in January 2023, reports that he was on a steroid taper through early February 2023.  He reports that he continues to have ongoing productive cough (brown sputum) and "left lung pain" with coughing and breathing.  He has been on Trelegy (inhaled steroid) for the past 2 months, prior to that was taking Symbicort.  He denies any hydrocortisone cream or other steroids. He is smoking 0.5 PPD cigarettes, was previously smoking 2+ PPD cigarettes but cut back ever since he had pneumonia in January 2023. He reports degenerative arthritis, but denies any rheumatoid arthritis, lupus, connective tissue disorder, or autoimmune disease.  He denies any B symptoms such as fever, chills, night sweats, unintentional weight loss. Denies any nausea, vomiting, or diarrhea. No new masses or lymphadenopathy per his  report. Patient reports appetite  at 100% and energy level at 25% (which is lower than usual, although he does have some chronic fatigue). He is maintaining stable weight at this time.  Past medical history significant for COPD, tobacco use, prediabetes, hyperlipidemia, hypertension, restless legs, chronic pain, insomnia, and osteoarthritis.  Patient is partially retired, working part-time.  He previously worked as a Administrator.  He smokes 0.5 PPD cigarettes, but previously smoked 2+ PPD cigarettes.  He reports that he drinks a few shots of moonshine once or twice a week.  He denies any current drug use, but reports that in the early 1980s he did "locker room, preludes, quaaludes, black beauties, and yellow jackets" (butyl nitrate, phenmetrazine, methaqualone, amphetamine, pentobarbital).  He reports that his family medical history is largely unknown since he grew up in foster care.    MEDICAL HISTORY:  Past Medical History:  Diagnosis Date   Asthma    COPD (chronic obstructive pulmonary disease) ([place])     SURGICAL HISTORY: No past surgical history on file.  SOCIAL HISTORY: Social History   Socioeconomic History   Marital status: Married    Spouse name: Not on file   Number of children: Not on file   Years of education: Not on file   Highest education level: Not on file  Occupational History   Not on file  Tobacco Use   Smoking status: Every Day    Packs/day: 2.00    Types: Cigarettes   Smokeless tobacco: Never  Vaping Use   Vaping Use: Never used  Substance and Sexual Activity   Alcohol use: Not Currently   Drug use: Never   Sexual activity: Not on file  Other Topics Concern  Not on file  Social History Narrative   Not on file   Social Determinants of Health   Financial Resource Strain: Not on file  Food Insecurity: Not on file  Transportation Needs: Not on file  Physical Activity: Not on file  Stress: Not on file  Social Connections: Not on file  Intimate Partner Violence: Not  on file    FAMILY HISTORY: No family history on file.  ALLERGIES:  is allergic to motrin [ibuprofen].  MEDICATIONS:  Current Outpatient Medications  Medication Sig Dispense Refill   albuterol (VENTOLIN HFA) 108 (90 Base) MCG/ACT inhaler Inhale 2 puffs into the lungs every 6 (six) hours as needed for shortness of breath.     atorvastatin (LIPITOR) 40 MG tablet Take 40 mg by mouth daily.     benazepril (LOTENSIN) 40 MG tablet Take 40 mg by mouth daily.     budesonide-formoterol (SYMBICORT) 160-4.5 MCG/ACT inhaler Inhale 2 puffs into the lungs daily as needed (short of breath).     cyclobenzaprine (FLEXERIL) 10 MG tablet Take 10 mg by mouth 3 (three) times daily.     diltiazem (CARDIZEM CD) 300 MG 24 hr capsule Take 300 mg by mouth daily.     furosemide (LASIX) 20 MG tablet  (Patient not taking: No sig reported)     hydrochlorothiazide (HYDRODIURIL) 25 MG tablet Take 25 mg by mouth daily.     HYDROcodone-acetaminophen (NORCO/VICODIN) 5-325 MG tablet Take 2 tablets by mouth every 6 (six) hours as needed for moderate pain or severe pain.     hydrocortisone 2.5 % cream      Ipratropium-Albuterol (COMBIVENT RESPIMAT) 20-100 MCG/ACT AERS respimat Inhale 1 puff into the lungs every 6 (six) hours as needed for wheezing. 4 g 1   naproxen sodium (ALEVE) 220 MG tablet Take 440 mg by mouth daily as needed.     rOPINIRole (REQUIP) 0.25 MG tablet Take 0.75 mg by mouth at bedtime.     terazosin (HYTRIN) 10 MG capsule Take 10 mg by mouth daily.     traZODone (DESYREL) 50 MG tablet Take 50 mg by mouth at bedtime.     Vitamin D, Ergocalciferol, (DRISDOL) 1.25 MG (50000 UNIT) CAPS capsule Take 50,000 Units by mouth 2 (two) times a week.     No current facility-administered medications for this visit.    REVIEW OF SYSTEMS:   Review of Systems  Constitutional:  Positive for fatigue. Negative for appetite change, chills, diaphoresis, fever and unexpected weight change.  HENT:   Negative for lump/mass and  nosebleeds.   Eyes:  Negative for eye problems.  Respiratory:  Positive for cough and shortness of breath. Negative for hemoptysis.   Cardiovascular:  Negative for chest pain, leg swelling and palpitations.  Gastrointestinal:  Negative for abdominal pain, blood in stool, constipation, diarrhea, nausea and vomiting.  Genitourinary:  Negative for hematuria.   Musculoskeletal:  Positive for arthralgias (knees).  Skin: Negative.   Neurological:  Positive for numbness (fingers). Negative for dizziness, headaches and light-headedness.  Hematological:  Does not bruise/bleed easily.      PHYSICAL EXAMINATION: ECOG PERFORMANCE STATUS: 1 - Symptomatic but completely ambulatory  There were no vitals filed for this visit. There were no vitals filed for this visit.  Physical Exam Constitutional:      Appearance: Normal appearance. He is obese.  HENT:     Head: Normocephalic and atraumatic.     Mouth/Throat:     Mouth: Mucous membranes are moist.  Eyes:     Extraocular  Movements: Extraocular movements intact.     Pupils: Pupils are equal, round, and reactive to light.  Cardiovascular:     Rate and Rhythm: Normal rate and regular rhythm.     Pulses: Normal pulses.     Heart sounds: Normal heart sounds.  Pulmonary:     Effort: Pulmonary effort is normal.     Breath sounds: Decreased air movement present. Examination of the left-middle field reveals decreased breath sounds. Examination of the left-lower field reveals decreased breath sounds. Decreased breath sounds present.  Abdominal:     General: Bowel sounds are normal.     Palpations: Abdomen is soft.     Tenderness: There is no abdominal tenderness.  Musculoskeletal:        General: No swelling.     Right lower leg: No edema.     Left lower leg: No edema.  Lymphadenopathy:     Cervical: No cervical adenopathy.  Skin:    General: Skin is warm and dry.  Neurological:     General: No focal deficit present.     Mental Status: He is  alert and oriented to person, place, and time.  Psychiatric:        Mood and Affect: Mood normal.        Behavior: Behavior normal.       LABORATORY DATA:  I have reviewed the data as listed No results found for this or any previous visit (from the past 2160 hour(s)).  RADIOGRAPHIC STUDIES: I have personally reviewed the radiological images as listed and agreed with the findings in the report. No results found.  ASSESSMENT & PLAN: 1.  Leukocytosis - Patient is seen at the request of his primary care provider (FNP Angelina Ok) for evaluation of leukocytosis. - Labs sent by PCP as follows: (07/16/2021): Normal CBC without leukocytosis (WBC 8.9) or thrombocytosis (10/24/2021): WBC 13.1, ANC 9.2, monocytes 1.0 (12/26/2021): WBC 13.6, ANC 9.1, monocytes 1.1 (01/28/2022): WBC 15.1, ANC 10.1, monocytes 1.0 + platelets 470 - History of intermittent leukocytosis for 10 years, per patient report.  Previous labs available in electronic medical record significant for leukocytosis and thrombocytosis in December 2021, which occurred during hospitalization for COPD exacerbation and in the setting of steroid use. - Hospitalized [facility]) with pneumonia in January 2023, reports that he was on a steroid taper through early February 2023.  Continues to have ongoing productive cough (brown sputum) and "left lung pain" with coughing and breathing. - He has been on Trelegy (inhaled steroid) for the past 2 months, prior to that was taking Symbicort.  He denies any hydrocortisone cream or other steroids. - He is smoking 0.5 PPD cigarettes, was previously smoking 2+ PPD cigarettes but cut back ever since he had pneumonia in January 2023. - He reports degenerative arthritis, but denies any rheumatoid arthritis, lupus, connective tissue disorder, or autoimmune disease. - No B symptoms such as fever, chills, night sweats, unintentional weight loss. - No lymphadenopathy on exam. - PLAN: Differential  diagnosis favors reactive leukocytosis in the setting of tobacco use, use of inhaled steroids, and recurrent lung infections. - However, due to upward trend of WBC and platelets, we will rule out MPN with BCR/ABL FISH and JAK2 with reflex. - Will refer to pulmonology for ongoing lung issues. - Labs today.  Office visit in 3 weeks to discuss results.  2.  Other history - PMH:  COPD, tobacco use, prediabetes, hyperlipidemia, hypertension, restless legs, chronic pain, insomnia, and osteoarthritis. - SOCIAL: Partially retired, working part-time.  Previously worked as a Administrator. - SUBSTANCE: Smokes 0.5 PPD cigarettes, but previously smoked 2+ PPD cigarettes.  Drinks a few shots of moonshine once or twice a week.  He denies any current drug use, but reports that in the early 1980s he did "locker room, preludes, quaaludes, black beauties, and yellow jackets" (butyl nitrate, phenmetrazine, methaqualone, amphetamine, pentobarbital). - FAMILY: Family medical history is largely unknown since he grew up in foster care.   PLAN SUMMARY & DISPOSITION: Labs today RTC in 3 weeks to discuss results Referral to pulmonology  All questions were answered. The patient knows to call the clinic with any problems, questions or concerns.   Medical decision making: Moderate  Time spent on visit: I spent 25 minutes counseling the patient face to face. The total time spent in the appointment was 40 minutes and more than 50% was on counseling.  I, Tarri Abernethy PA-C, have seen this patient in conjunction with Dr. Derek Jack.  Greater than 50% of visit was performed by Dr. Delton Coombes.   Harriett Rush, PA-C 03/25/2022 10:27 AM  DR. Haru Shaff: I have independently evaluated this patient and formulated my assessment and plan.  I agree with HPI, assessment and plan written by Casey Burkitt, PA-C.  Mr. Centrella is being evaluated for intermittent leukocytosis of 10 years duration.   He also reported significant weight loss in the last 6 months.  Current active smoker.  He is on 2 different steroid inhalers but not on any oral systemic steroids.  Evaluation of white count shows predominantly neutrophilic leukocytosis.  Will evaluate for myeloproliferative disorders by checking BCR/ABL and JAK2 V6 71F mutations.  Will consider doing lung cancer screening CT of the chest.  If he continues to lose weight, will also consider imaging of abdomen and pelvis.  He will follow-up with [place] in 3 weeks.

## 2022-03-25 ENCOUNTER — Inpatient Hospital Stay (HOSPITAL_COMMUNITY): Payer: Medicare PPO | Attending: Hematology | Admitting: Hematology

## 2022-03-25 ENCOUNTER — Inpatient Hospital Stay (HOSPITAL_COMMUNITY): Payer: Medicare PPO

## 2022-03-25 ENCOUNTER — Encounter (HOSPITAL_COMMUNITY): Payer: Self-pay | Admitting: Hematology

## 2022-03-25 VITALS — BP 157/77 | HR 74 | Temp 98.1°F | Resp 18 | Ht 70.0 in | Wt 196.0 lb

## 2022-03-25 DIAGNOSIS — J449 Chronic obstructive pulmonary disease, unspecified: Secondary | ICD-10-CM | POA: Insufficient documentation

## 2022-03-25 DIAGNOSIS — G2581 Restless legs syndrome: Secondary | ICD-10-CM | POA: Insufficient documentation

## 2022-03-25 DIAGNOSIS — F17210 Nicotine dependence, cigarettes, uncomplicated: Secondary | ICD-10-CM | POA: Insufficient documentation

## 2022-03-25 DIAGNOSIS — M1990 Unspecified osteoarthritis, unspecified site: Secondary | ICD-10-CM | POA: Insufficient documentation

## 2022-03-25 DIAGNOSIS — D72829 Elevated white blood cell count, unspecified: Secondary | ICD-10-CM | POA: Diagnosis present

## 2022-03-25 DIAGNOSIS — D72825 Bandemia: Secondary | ICD-10-CM

## 2022-03-25 DIAGNOSIS — G8929 Other chronic pain: Secondary | ICD-10-CM | POA: Insufficient documentation

## 2022-03-25 DIAGNOSIS — I10 Essential (primary) hypertension: Secondary | ICD-10-CM | POA: Diagnosis not present

## 2022-03-25 DIAGNOSIS — E785 Hyperlipidemia, unspecified: Secondary | ICD-10-CM | POA: Diagnosis not present

## 2022-03-25 LAB — CBC WITH DIFFERENTIAL/PLATELET
Abs Immature Granulocytes: 0.07 10*3/uL (ref 0.00–0.07)
Basophils Absolute: 0.1 10*3/uL (ref 0.0–0.1)
Basophils Relative: 0 %
Eosinophils Absolute: 0.3 10*3/uL (ref 0.0–0.5)
Eosinophils Relative: 2 %
HCT: 36.6 % — ABNORMAL LOW (ref 39.0–52.0)
Hemoglobin: 11.6 g/dL — ABNORMAL LOW (ref 13.0–17.0)
Immature Granulocytes: 0 %
Lymphocytes Relative: 14 %
Lymphs Abs: 2.2 10*3/uL (ref 0.7–4.0)
MCH: 28.1 pg (ref 26.0–34.0)
MCHC: 31.7 g/dL (ref 30.0–36.0)
MCV: 88.6 fL (ref 80.0–100.0)
Monocytes Absolute: 1.1 10*3/uL — ABNORMAL HIGH (ref 0.1–1.0)
Monocytes Relative: 7 %
Neutro Abs: 12.1 10*3/uL — ABNORMAL HIGH (ref 1.7–7.7)
Neutrophils Relative %: 77 %
Platelets: 549 10*3/uL — ABNORMAL HIGH (ref 150–400)
RBC: 4.13 MIL/uL — ABNORMAL LOW (ref 4.22–5.81)
RDW: 14.5 % (ref 11.5–15.5)
WBC: 15.9 10*3/uL — ABNORMAL HIGH (ref 4.0–10.5)
nRBC: 0 % (ref 0.0–0.2)

## 2022-03-25 LAB — C-REACTIVE PROTEIN: CRP: 16 mg/dL — ABNORMAL HIGH (ref <1.0)

## 2022-03-25 LAB — SEDIMENTATION RATE: Sed Rate: 67 mm/hr — ABNORMAL HIGH (ref 0–16)

## 2022-03-25 NOTE — Patient Instructions (Signed)
[facility] at [facility] Discharge Instructions  You were seen today by Dr. Ellin Saba & Rojelio Brenner PA-C for your elevated white blood cells.  We do not yet know the cause of your white blood cells, but suspected it may be due to underlying lung disease and tobacco use.  We will check labs today to make sure you do not have any signs of underlying bone marrow or blood condition such as leukemia, although this is considered less likely.  We will also refer you to see a lung doctor (pulmonologist) for further treatment of your COPD and pneumonia.  LABS: Check labs today before leaving the hospital  OTHER: Referral to pulmonology (Dr. Vassie Loll)  MEDICATIONS: No changes to home medications  FOLLOW-UP APPOINTMENT: Office visit in 3 weeks to discuss results   Thank you for choosing  Cancer Center at [facility] to provide your oncology and hematology care.  To afford each patient quality time with our provider, please arrive at least 15 minutes before your scheduled appointment time.   If you have a lab appointment with the Cancer Center please come in thru the Main Entrance and check in at the main information desk.  You need to re-schedule your appointment should you arrive 10 or more minutes late.  We strive to give you quality time with our providers, and arriving late affects you and other patients whose appointments are after yours.  Also, if you no show three or more times for appointments you may be dismissed from the clinic at the providers discretion.     Again, thank you for choosing [facility].  Our hope is that these requests will decrease the amount of time that you wait before being seen by our physicians.       _____________________________________________________________  Should you have questions after your visit to [facility], please contact our office at [phone] and follow the prompts.  Our office  hours are 8:00 a.m. and 4:30 p.m. Monday - Friday.  Please note that voicemails left after 4:00 p.m. may not be returned until the following business day.  We are closed weekends and major holidays.  You do have access to a nurse 24-7, just call the main number to the clinic [phone] and do not press any options, hold on the line and a nurse will answer the phone.    For prescription refill requests, have your pharmacy contact our office and allow 72 hours.    Due to Covid, you will need to wear a mask upon entering the hospital. If you do not have a mask, a mask will be given to you at the Main Entrance upon arrival. For doctor visits, patients may have 1 support person age 65 or older with them. For treatment visits, patients can not have anyone with them due to social distancing guidelines and our immunocompromised population.

## 2022-03-28 LAB — BCR-ABL1 FISH
Cells Analyzed: 200
Cells Counted: 200

## 2022-04-02 LAB — JAK2 V617F RFX CALR/MPL/E12-15

## 2022-04-02 LAB — CALR +MPL + E12-E15  (REFLEX)

## 2022-04-16 NOTE — Progress Notes (Unsigned)
[address] [place], [place] [address]   CLINIC:  Medical Oncology/Hematology  PCP:  [facility] [address] [place] [address] [phone]   REASON FOR VISIT:  Follow-up for leukocytosis  PRIOR THERAPY: None  CURRENT THERAPY: Under work-up  INTERVAL HISTORY:  David Callahan 66 y.o. male returns for routine follow-up of leukocytosis.  He was seen for initial consultation by Dr. Delton Coombes on David Abernethy PA-C on 03/25/2022.  At today's visit, he reports feeling well.  He denies any changes in his symptoms or baseline health status since his initial visit 3 weeks ago.  He reports that his lung issues are much better.  He reportedly had a an extreme coughing fit 2 weeks ago and coughed up a peanut.  Since that time, he has had decreased cough, improved breathing, and resolution of his chest pain.  He continues to take inhaled steroids for his COPD.  Most recent oral steroids were February 2023 following his hospitalization for pneumonia.  He continues to smoke 0.5 PPD cigarettes, has cut back from 2 PPD cigarettes.  He denies any B symptoms such as fever, chills, night sweats.  No new masses or lymphadenopathy per his report.  He reports that he had lost about 60 pounds since January 2023.  Weight has been stable since his last visit, and he reports that he is starting to regain some weight and is "eating like a pig."  He has 75% energy and 100% appetite. He endorses that he is maintaining a stable weight.   REVIEW OF SYSTEMS:  Review of Systems  Constitutional:  Negative for appetite change, chills, diaphoresis, fatigue, fever and unexpected weight change.  HENT:   Negative for lump/mass and nosebleeds.   Eyes:  Negative for eye problems.  Respiratory:  Positive for cough and shortness of breath. Negative for hemoptysis.   Cardiovascular:  Negative for chest pain, leg swelling and palpitations.  Gastrointestinal:   Negative for abdominal pain, blood in stool, constipation, diarrhea, nausea and vomiting.  Genitourinary:  Negative for hematuria.   Musculoskeletal:  Positive for arthralgias.  Skin: Negative.   Neurological:  Negative for dizziness, headaches and light-headedness.  Hematological:  Does not bruise/bleed easily.      PAST MEDICAL/SURGICAL HISTORY:  Past Medical History:  Diagnosis Date   Asthma    COPD (chronic obstructive pulmonary disease) ([place])    No past surgical history on file.   SOCIAL HISTORY:  Social History   Socioeconomic History   Marital status: Married    Spouse name: Not on file   Number of children: Not on file   Years of education: Not on file   Highest education level: Not on file  Occupational History   Not on file  Tobacco Use   Smoking status: Every Day    Packs/day: 0.50    Types: Cigarettes   Smokeless tobacco: Never  Vaping Use   Vaping Use: Never used  Substance and Sexual Activity   Alcohol use: Not Currently   Drug use: Never   Sexual activity: Not on file  Other Topics Concern   Not on file  Social History Narrative   Not on file   Social Determinants of Health   Financial Resource Strain: Not on file  Food Insecurity: Not on file  Transportation Needs: Not on file  Physical Activity: Not on file  Stress: Not on file  Social Connections: Not on file  Intimate Partner Violence: Not on file  FAMILY HISTORY:  No family history on file.  CURRENT MEDICATIONS:  Outpatient Encounter Medications as of 04/17/2022  Medication Sig Note   albuterol (VENTOLIN HFA) 108 (90 Base) MCG/ACT inhaler Inhale 2 puffs into the lungs every 6 (six) hours as needed for shortness of breath.    atorvastatin (LIPITOR) 40 MG tablet Take 40 mg by mouth daily.    benazepril (LOTENSIN) 40 MG tablet Take 40 mg by mouth daily.    budesonide-formoterol (SYMBICORT) 160-4.5 MCG/ACT inhaler Inhale 2 puffs into the lungs daily as needed (short of breath).     cyclobenzaprine (FLEXERIL) 10 MG tablet Take 10 mg by mouth 3 (three) times daily.    diltiazem (CARDIZEM CD) 300 MG 24 hr capsule Take 300 mg by mouth daily.    furosemide (LASIX) 20 MG tablet     hydrochlorothiazide (HYDRODIURIL) 25 MG tablet Take 25 mg by mouth daily.    HYDROcodone-acetaminophen (NORCO/VICODIN) 5-325 MG tablet Take 2 tablets by mouth every 6 (six) hours as needed for moderate pain or severe pain.    hydrocortisone 2.5 % cream     Ipratropium-Albuterol (COMBIVENT RESPIMAT) 20-100 MCG/ACT AERS respimat Inhale 1 puff into the lungs every 6 (six) hours as needed for wheezing.    naproxen sodium (ALEVE) 220 MG tablet Take 440 mg by mouth daily as needed.    rOPINIRole (REQUIP) 0.25 MG tablet Take 0.75 mg by mouth at bedtime.    terazosin (HYTRIN) 10 MG capsule Take 10 mg by mouth daily.    traZODone (DESYREL) 50 MG tablet Take 50 mg by mouth at bedtime.    TRELEGY ELLIPTA 200-62.5-25 MCG/ACT AEPB Inhale 1 puff into the lungs daily.    Vitamin D, Ergocalciferol, (DRISDOL) 1.25 MG (50000 UNIT) CAPS capsule Take 50,000 Units by mouth 2 (two) times a week. 10/07/2020: Needs refill   No facility-administered encounter medications on file as of 04/17/2022.    ALLERGIES:  Allergies  Allergen Reactions   Motrin [Ibuprofen] Swelling    Anti-inflammatory analgesics- cause anaphylaxis     PHYSICAL EXAM:  ECOG PERFORMANCE STATUS: 0 - Asymptomatic  There were no vitals filed for this visit. There were no vitals filed for this visit. Physical Exam Constitutional:      Appearance: Normal appearance. He is obese.  HENT:     Head: Normocephalic and atraumatic.     Mouth/Throat:     Mouth: Mucous membranes are moist.  Eyes:     Extraocular Movements: Extraocular movements intact.     Pupils: Pupils are equal, round, and reactive to light.  Cardiovascular:     Rate and Rhythm: Normal rate and regular rhythm.     Pulses: Normal pulses.     Heart sounds: Normal heart sounds.   Pulmonary:     Effort: Pulmonary effort is normal.     Breath sounds: Normal breath sounds.  Abdominal:     General: Bowel sounds are normal.     Palpations: Abdomen is soft.     Tenderness: There is no abdominal tenderness.  Musculoskeletal:        General: No swelling.     Right lower leg: No edema.     Left lower leg: No edema.  Lymphadenopathy:     Cervical: No cervical adenopathy.  Skin:    General: Skin is warm and dry.  Neurological:     General: No focal deficit present.     Mental Status: He is alert and oriented to person, place, and time.  Psychiatric:  Mood and Affect: Mood normal.        Behavior: Behavior normal.      LABORATORY DATA:  I have reviewed the labs as listed.  CBC    Component Value Date/Time   WBC 15.9 (H) 03/25/2022 1006   RBC 4.13 (L) 03/25/2022 1006   HGB 11.6 (L) 03/25/2022 1006   HCT 36.6 (L) 03/25/2022 1006   PLT 549 (H) 03/25/2022 1006   MCV 88.6 03/25/2022 1006   MCH 28.1 03/25/2022 1006   MCHC 31.7 03/25/2022 1006   RDW 14.5 03/25/2022 1006   LYMPHSABS 2.2 03/25/2022 1006   MONOABS 1.1 (H) 03/25/2022 1006   EOSABS 0.3 03/25/2022 1006   BASOSABS 0.1 03/25/2022 1006      Latest Ref Rng & Units 10/08/2020    4:11 AM 10/07/2020    2:30 PM  CMP  Glucose 70 - 99 mg/dL 160  106   BUN 8 - 23 mg/dL 20  16   Creatinine 0.61 - 1.24 mg/dL 1.11  1.16   Sodium 135 - 145 mmol/L 138  137   Potassium 3.5 - 5.1 mmol/L 3.7  4.6   Chloride 98 - 111 mmol/L 101  100   CO2 22 - 32 mmol/L 26  26   Calcium 8.9 - 10.3 mg/dL 9.4  9.3     DIAGNOSTIC IMAGING:  I have independently reviewed the relevant imaging and discussed with the patient.  ASSESSMENT & PLAN: 1.  Leukocytosis & thrombocytosis - Patient is seen at the request of his primary care provider (FNP David Callahan) for evaluation of leukocytosis. - Labs sent by PCP as follows: (07/16/2021): Normal CBC without leukocytosis (WBC 8.9) or thrombocytosis (10/24/2021): WBC 13.1,  ANC 9.2, monocytes 1.0 (12/26/2021): WBC 13.6, ANC 9.1, monocytes 1.1 (01/28/2022): WBC 15.1, ANC 10.1, monocytes 1.0 + platelets 470 - History of intermittent leukocytosis for 10 years, per patient report.  Previous labs available in electronic medical record significant for leukocytosis and thrombocytosis in December 2021, which occurred during hospitalization for COPD exacerbation and in the setting of steroid use. - Hospitalized [facility]) with pneumonia in January 2023, reports that he was on a steroid taper through early February 2023.  He continued to have ongoing productive cough (brown sputum) and "left lung pain" with coughing and breathing for several months, which resolved after he "coughed up a peanut" in June 2023 - He has been on Trelegy (inhaled steroid) for the past 2 months, prior to that was taking Symbicort.  He denies any hydrocortisone cream or other steroids. - He is smoking 0.5 PPD cigarettes, was previously smoking 2+ PPD cigarettes but cut back ever since he had pneumonia in January 2023.   - He reports degenerative arthritis, but denies any rheumatoid arthritis, lupus, connective tissue disorder, or autoimmune disease. - No B symptoms such as fever, chills, night sweats. - No lymphadenopathy on exam. - Hematology work-up (03/24/2022): Negative BCR/ABL FISH. Negative JAK2 with reflex to CALR, MPL, and E12-15 Elevated CRP 16.0 with ESR 67 - Most recent CBC (03/25/2022): WBC 15.9/ANC 12.1/monocytes 1.1, platelets 549, Hgb 11.6/MCV 88.6. - Differential diagnosis favors reactive leukocytosis in the setting of recurrent lung infections, chronic tobacco use, and inhaled steroids. - PLAN: We will repeat CBC with RTC in 6 months.  If no worsening issues, we will plan to discharge from clinic at that time. - Continue following with pulmonology for ongoing lung issues.  2.  Weight loss - Patient reports that he lost significant weight after his hospitalization  for  pneumonia in January 2023, reportedly dropped from 242 pounds down to 180 pounds.  He is starting to regain weight, with weight today 194 pounds. - He reports good appetite.  Weight today is 194 pounds, down 1-1/2 pounds in the past 2 weeks. - PLAN:  We will see him for follow-up in 6 months.  If stable, we will discharge from clinic at that time.  3.  Tobacco abuse - Currently smoking 0.5 PPD cigarettes for the past 6 months.  Prior to that he was smoking 2-3 PPD for the past 50 years. - This patient meets criteria for low-dose CT lung cancer screening (age 31-80 with a 20+ pack year history,  current everyday smoker /OR/ quit < 15 years ago, no current signs or symptoms of lung cancer) - The shared decision making visit discussion included risks and benefits of screening, potential for follow-up, diagnostic testing for abnormal scans, potential for false positive tests, overdiagnosis, discussion about total radiation exposure - Patient stated willingness to undergo diagnostics and treatment as needed - Patient was counseled on smoking cessation to decrease the  risk of lung cancer, pulmonary disease, heart disease, and stroke - Patient has been referred to Lung Cancer Screening Nurse Coordinator for further scheduling of LDCT and for further resources regarding free nicotine replacement therapy and information about smoking cessation classes  - PLAN: We will order LDCT scan and schedule patient for initial LCS imaging study.  4.  Other history - PMH:  COPD, tobacco use, prediabetes, hyperlipidemia, hypertension, restless legs, chronic pain, insomnia, and osteoarthritis. - SOCIAL: Partially retired, working part-time.  Previously worked as a Administrator. - SUBSTANCE: Smokes 0.5 PPD cigarettes, but previously smoked 2+ PPD cigarettes.  Drinks a few shots of moonshine once or twice a week.  He denies any current drug use, but reports that in the early 1980s he did "locker room, preludes,  quaaludes, black beauties, and yellow jackets" (butyl nitrate, phenmetrazine, methaqualone, amphetamine, pentobarbital). - FAMILY: Family medical history is largely unknown since he grew up in foster care.   PLAN SUMMARY & DISPOSITION: LDCT scan of chest with referral to nurse coordinator for annual LCS program Same-day CBC and office visit in 6 months  All questions were answered. The patient knows to call the clinic with any problems, questions or concerns.  Medical decision making: Moderate  Time spent on visit: I spent 20 minutes counseling the patient face to face. The total time spent in the appointment was 30 minutes and more than 50% was on counseling.   Harriett Rush, PA-C  04/17/2022 10:43 AM

## 2022-04-17 ENCOUNTER — Inpatient Hospital Stay (HOSPITAL_COMMUNITY): Payer: Medicare PPO | Attending: Hematology | Admitting: Physician Assistant

## 2022-04-17 VITALS — Ht 70.0 in | Wt 194.2 lb

## 2022-04-17 DIAGNOSIS — D72825 Bandemia: Secondary | ICD-10-CM | POA: Diagnosis not present

## 2022-04-17 DIAGNOSIS — F17210 Nicotine dependence, cigarettes, uncomplicated: Secondary | ICD-10-CM | POA: Insufficient documentation

## 2022-04-17 DIAGNOSIS — G4700 Insomnia, unspecified: Secondary | ICD-10-CM | POA: Insufficient documentation

## 2022-04-17 DIAGNOSIS — D72829 Elevated white blood cell count, unspecified: Secondary | ICD-10-CM | POA: Insufficient documentation

## 2022-04-17 DIAGNOSIS — Z87891 Personal history of nicotine dependence: Secondary | ICD-10-CM

## 2022-04-17 DIAGNOSIS — E785 Hyperlipidemia, unspecified: Secondary | ICD-10-CM | POA: Diagnosis not present

## 2022-04-17 DIAGNOSIS — J449 Chronic obstructive pulmonary disease, unspecified: Secondary | ICD-10-CM | POA: Insufficient documentation

## 2022-04-17 DIAGNOSIS — G8929 Other chronic pain: Secondary | ICD-10-CM | POA: Insufficient documentation

## 2022-04-17 DIAGNOSIS — R634 Abnormal weight loss: Secondary | ICD-10-CM | POA: Insufficient documentation

## 2022-04-17 DIAGNOSIS — M1990 Unspecified osteoarthritis, unspecified site: Secondary | ICD-10-CM | POA: Insufficient documentation

## 2022-04-17 DIAGNOSIS — I10 Essential (primary) hypertension: Secondary | ICD-10-CM | POA: Insufficient documentation

## 2022-04-17 DIAGNOSIS — G2581 Restless legs syndrome: Secondary | ICD-10-CM | POA: Insufficient documentation

## 2022-04-17 NOTE — Patient Instructions (Signed)
[facility] Discharge Instructions  You were seen today by Rojelio Brenner PA-C for your elevated white blood cells.  As we discussed, your blood test did not show any signs of blood cancer or genetic mutations causing high white blood cells.  I suspect that your white blood cells were elevated related to your recent lung issues and use of steroid inhalers.  We will repeat your labs again in 6 months, and if you do not have any major changes we will discharge you from the clinic at that time.  Due to your history of tobacco use, we will schedule you for a screening CT scan of your chest, which would be repeated annually to screen for lung cancer.  Please continue to work on smoking cessation!  FOLLOW-UP APPOINTMENT: Office visit in 6 months, same day as labs   Thank you for choosing [facility] to provide your oncology and hematology care.  To afford each patient quality time with our provider, please arrive at least 15 minutes before your scheduled appointment time.   If you have a lab appointment with the Cancer Center please come in thru the Main Entrance and check in at the main information desk.  You need to re-schedule your appointment should you arrive 10 or more minutes late.  We strive to give you quality time with our providers, and arriving late affects you and other patients whose appointments are after yours.  Also, if you no show three or more times for appointments you may be dismissed from the clinic at the providers discretion.     Again, thank you for choosing [facility].  Our hope is that these requests will decrease the amount of time that you wait before being seen by our physicians.       _____________________________________________________________  Should you have questions after your visit to [facility], please contact our office at [phone] and follow the  prompts.  Our office hours are 8:00 a.m. and 4:30 p.m. Monday - Friday.  Please note that voicemails left after 4:00 p.m. may not be returned until the following business day.  We are closed weekends and major holidays.  You do have access to a nurse 24-7, just call the main number to the clinic [phone] and do not press any options, hold on the line and a nurse will answer the phone.    For prescription refill requests, have your pharmacy contact our office and allow 72 hours.    Due to Covid, you will need to wear a mask upon entering the hospital. If you do not have a mask, a mask will be given to you at the Main Entrance upon arrival. For doctor visits, patients may have 1 support person age 41 or older with them. For treatment visits, patients can not have anyone with them due to social distancing guidelines and our immunocompromised population.

## 2022-05-15 ENCOUNTER — Ambulatory Visit (HOSPITAL_COMMUNITY): Admission: RE | Admit: 2022-05-15 | Payer: Medicare PPO | Source: Ambulatory Visit

## 2022-06-04 ENCOUNTER — Inpatient Hospital Stay (HOSPITAL_COMMUNITY)
Admission: EM | Admit: 2022-06-04 | Discharge: 2022-06-07 | DRG: 194 | Disposition: A | Discharge status: Home / Self Care | Payer: Medicare PPO | Attending: Internal Medicine | Admitting: Internal Medicine

## 2022-06-04 ENCOUNTER — Other Ambulatory Visit: Payer: Self-pay

## 2022-06-04 ENCOUNTER — Encounter (HOSPITAL_COMMUNITY): Payer: Self-pay | Admitting: Emergency Medicine

## 2022-06-04 ENCOUNTER — Emergency Department (HOSPITAL_COMMUNITY): Payer: Medicare PPO

## 2022-06-04 DIAGNOSIS — F17210 Nicotine dependence, cigarettes, uncomplicated: Secondary | ICD-10-CM | POA: Diagnosis present

## 2022-06-04 DIAGNOSIS — D75839 Thrombocytosis, unspecified: Secondary | ICD-10-CM | POA: Diagnosis present

## 2022-06-04 DIAGNOSIS — E871 Hypo-osmolality and hyponatremia: Secondary | ICD-10-CM | POA: Diagnosis present

## 2022-06-04 DIAGNOSIS — Z7951 Long term (current) use of inhaled steroids: Secondary | ICD-10-CM

## 2022-06-04 DIAGNOSIS — N179 Acute kidney failure, unspecified: Secondary | ICD-10-CM | POA: Diagnosis present

## 2022-06-04 DIAGNOSIS — I10 Essential (primary) hypertension: Secondary | ICD-10-CM | POA: Diagnosis present

## 2022-06-04 DIAGNOSIS — G2581 Restless legs syndrome: Secondary | ICD-10-CM | POA: Diagnosis present

## 2022-06-04 DIAGNOSIS — J189 Pneumonia, unspecified organism: Principal | ICD-10-CM | POA: Diagnosis present

## 2022-06-04 DIAGNOSIS — J449 Chronic obstructive pulmonary disease, unspecified: Secondary | ICD-10-CM | POA: Diagnosis present

## 2022-06-04 DIAGNOSIS — E782 Mixed hyperlipidemia: Secondary | ICD-10-CM

## 2022-06-04 DIAGNOSIS — D72829 Elevated white blood cell count, unspecified: Secondary | ICD-10-CM | POA: Diagnosis present

## 2022-06-04 DIAGNOSIS — R739 Hyperglycemia, unspecified: Secondary | ICD-10-CM

## 2022-06-04 DIAGNOSIS — E876 Hypokalemia: Secondary | ICD-10-CM | POA: Diagnosis present

## 2022-06-04 DIAGNOSIS — E872 Acidosis, unspecified: Secondary | ICD-10-CM | POA: Diagnosis present

## 2022-06-04 DIAGNOSIS — R0602 Shortness of breath: Secondary | ICD-10-CM | POA: Diagnosis present

## 2022-06-04 DIAGNOSIS — R748 Abnormal levels of other serum enzymes: Secondary | ICD-10-CM | POA: Diagnosis present

## 2022-06-04 DIAGNOSIS — Z886 Allergy status to analgesic agent status: Secondary | ICD-10-CM | POA: Diagnosis not present

## 2022-06-04 DIAGNOSIS — Z79899 Other long term (current) drug therapy: Secondary | ICD-10-CM | POA: Diagnosis not present

## 2022-06-04 DIAGNOSIS — A419 Sepsis, unspecified organism: Secondary | ICD-10-CM

## 2022-06-04 LAB — CBC WITH DIFFERENTIAL/PLATELET
Abs Immature Granulocytes: 0 10*3/uL (ref 0.00–0.07)
Basophils Absolute: 0 10*3/uL (ref 0.0–0.1)
Basophils Relative: 0 %
Eosinophils Absolute: 0 10*3/uL (ref 0.0–0.5)
Eosinophils Relative: 0 %
HCT: 35.1 % — ABNORMAL LOW (ref 39.0–52.0)
Hemoglobin: 11.6 g/dL — ABNORMAL LOW (ref 13.0–17.0)
Lymphocytes Relative: 2 %
Lymphs Abs: 0.6 10*3/uL — ABNORMAL LOW (ref 0.7–4.0)
MCH: 28.4 pg (ref 26.0–34.0)
MCHC: 33 g/dL (ref 30.0–36.0)
MCV: 85.8 fL (ref 80.0–100.0)
Monocytes Absolute: 0.3 10*3/uL (ref 0.1–1.0)
Monocytes Relative: 1 %
Neutro Abs: 28.3 10*3/uL — ABNORMAL HIGH (ref 1.7–7.7)
Neutrophils Relative %: 97 %
Platelets: 622 10*3/uL — ABNORMAL HIGH (ref 150–400)
RBC: 4.09 MIL/uL — ABNORMAL LOW (ref 4.22–5.81)
RDW: 17.7 % — ABNORMAL HIGH (ref 11.5–15.5)
WBC: 29.2 10*3/uL — ABNORMAL HIGH (ref 4.0–10.5)
nRBC: 0 % (ref 0.0–0.2)

## 2022-06-04 LAB — BASIC METABOLIC PANEL
Anion gap: 13 (ref 5–15)
BUN: 38 mg/dL — ABNORMAL HIGH (ref 8–23)
CO2: 28 mmol/L (ref 22–32)
Calcium: 8.6 mg/dL — ABNORMAL LOW (ref 8.9–10.3)
Chloride: 93 mmol/L — ABNORMAL LOW (ref 98–111)
Creatinine, Ser: 2.05 mg/dL — ABNORMAL HIGH (ref 0.61–1.24)
GFR, Estimated: 35 mL/min — ABNORMAL LOW (ref >60)
Glucose, Bld: 232 mg/dL — ABNORMAL HIGH (ref 70–99)
Potassium: 2.7 mmol/L — CL (ref 3.5–5.1)
Sodium: 134 mmol/L — ABNORMAL LOW (ref 135–145)

## 2022-06-04 LAB — PROTIME-INR
INR: 1.4 — ABNORMAL HIGH (ref 0.8–1.2)
Prothrombin Time: 17 seconds — ABNORMAL HIGH (ref 11.4–15.2)

## 2022-06-04 LAB — BLOOD GAS, VENOUS
Acid-Base Excess: 4.8 mmol/L — ABNORMAL HIGH (ref 0.0–2.0)
Bicarbonate: 31.6 mmol/L — ABNORMAL HIGH (ref 20.0–28.0)
Drawn by: 27160
FIO2: 28 %
O2 Saturation: 30.9 %
Patient temperature: 37.1
pCO2, Ven: 56 mmHg (ref 44–60)
pH, Ven: 7.36 (ref 7.25–7.43)
pO2, Ven: <31 mmHg — CL (ref 32–45)

## 2022-06-04 LAB — LACTIC ACID, PLASMA
Lactic Acid, Venous: 2.3 mmol/L (ref 0.5–1.9)
Lactic Acid, Venous: 1.8 mmol/L (ref 0.5–1.9)

## 2022-06-04 MED ORDER — LACTATED RINGERS IV BOLUS
500.0000 mL | Freq: Once | INTRAVENOUS | Status: AC
  Administered 2022-06-04: 500 mL via INTRAVENOUS

## 2022-06-04 MED ORDER — POTASSIUM CHLORIDE 10 MEQ/100ML IV SOLN
10.0000 meq | INTRAVENOUS | Status: AC
  Administered 2022-06-04 (×4): 10 meq via INTRAVENOUS
  Filled 2022-06-04 (×4): qty 100

## 2022-06-04 MED ORDER — SODIUM CHLORIDE 0.9 % IV SOLN
2.0000 g | INTRAVENOUS | Status: DC
  Administered 2022-06-04 – 2022-06-06 (×3): 2 g via INTRAVENOUS
  Filled 2022-06-04 (×3): qty 20

## 2022-06-04 MED ORDER — SODIUM CHLORIDE 0.9 % IV SOLN
500.0000 mg | INTRAVENOUS | Status: DC
  Administered 2022-06-04 – 2022-06-06 (×3): 500 mg via INTRAVENOUS
  Filled 2022-06-04 (×3): qty 5

## 2022-06-04 NOTE — ED Notes (Signed)
ED Provider at bedside. 

## 2022-06-04 NOTE — ED Notes (Addendum)
HR irregular  40s-70s Pt placed on 02 2l [place] in triage.

## 2022-06-04 NOTE — H&P (Incomplete)
History and Physical    Patient: David Callahan LPF:[id number] DOB: 08/27/1956 DOA: 06/04/2022 DOS: the patient was seen and examined on 06/05/2022 PCP: Patient, No Pcp Per  Patient coming from: Home  Chief Complaint:  Chief Complaint  Patient presents with   Cough   Shortness of Breath   HPI: ELIJAH PHOMMACHANH is a 66 y.o. male with medical history significant of COPD, hyperlipidemia, hypertension, RLS who presents to the emergency department after being sent from PCPs office.  Patient complains of 4-day onset of worsening shortness of breath which was associated with productive cough of clear phlegm.  He denies fever, chest pain, nausea or vomiting.  He went to [facility] due to cough and chest congestion.  COVID and flu test done was negative and patient was treated with steroids for possible acute exacerbation of COPD.  Chest x-ray done was concerning for infectious process/malignancy and was referred to ED for further evaluation.  ED Course:  In the emergency department, he was initially tachypneic and intermittently bradycardic, but other vital signs were within normal range.  Work-up in the ED showed leukocytosis, normocytic anemia, thrombocytosis, hyponatremia, hypokalemia, hyperglycemia, BUN/creatinine 30/2.05 (baseline creatinine at 1.1-1.2).  Lactic acid 2.3 > 1.8.  Blood culture pending CT chest without contrast showed moderate loculated left pleural effusion. Airspace disease in the left lower lobe concerning for pneumonia. Lingular opacity likely reflects atelectasis. Patient was treated with IV ceftriaxone and azithromycin, IV hydration was provided, potassium was replenished.  Hospitalist was asked to admit patient for further evaluation and management.   Review of Systems: Review of systems as noted in the HPI. All other systems reviewed and are negative.   Past Medical History:  Diagnosis Date   Asthma    COPD (chronic obstructive pulmonary disease) (HCC)     Past Surgical History:  Procedure Laterality Date   KNEE SURGERY      Social History:  reports that he has been smoking cigarettes. He has been smoking an average of .5 packs per day. He has never used smokeless tobacco. He reports that he does not currently use alcohol. He reports that he does not use drugs.   Allergies  Allergen Reactions   Motrin [Ibuprofen] Swelling    Anti-inflammatory analgesics- cause anaphylaxis    History reviewed. No pertinent family history.   Prior to Admission medications   Medication Sig Start Date End Date Taking? Authorizing Provider  albuterol (VENTOLIN HFA) 108 (90 Base) MCG/ACT inhaler Inhale 2 puffs into the lungs every 6 (six) hours as needed for shortness of breath.    [provider]  atorvastatin (LIPITOR) 40 MG tablet Take 40 mg by mouth daily.    [provider]  benazepril (LOTENSIN) 40 MG tablet Take 40 mg by mouth daily. 09/18/20   [provider]  budesonide-formoterol (SYMBICORT) 160-4.5 MCG/ACT inhaler Inhale 2 puffs into the lungs daily as needed (short of breath).    [provider]  cyclobenzaprine (FLEXERIL) 10 MG tablet Take 10 mg by mouth 3 (three) times daily. 09/17/20   [provider]  diltiazem (CARDIZEM CD) 300 MG 24 hr capsule Take 300 mg by mouth daily. 09/18/20   [provider]  furosemide (LASIX) 20 MG tablet     [provider]  hydrochlorothiazide (HYDRODIURIL) 25 MG tablet Take 25 mg by mouth daily.    [provider]  HYDROcodone-acetaminophen (NORCO/VICODIN) 5-325 MG tablet Take 2 tablets by mouth every 6 (six) hours as needed for moderate pain  or severe pain. 08/17/20   [provider]  hydrocortisone 2.5 % cream  03/09/18   [provider]  Ipratropium-Albuterol (COMBIVENT RESPIMAT) 20-100 MCG/ACT AERS respimat Inhale 1 puff into the lungs every 6 (six) hours as needed for wheezing. 10/08/20   Manuella Ghazi, Pratik D, DO  naproxen sodium  (ALEVE) 220 MG tablet Take 440 mg by mouth daily as needed.    [provider]  rOPINIRole (REQUIP) 0.25 MG tablet Take 0.75 mg by mouth at bedtime. 09/18/20   [provider]  terazosin (HYTRIN) 10 MG capsule Take 10 mg by mouth daily. 09/26/20   [provider]  traZODone (DESYREL) 50 MG tablet Take 50 mg by mouth at bedtime. 09/26/20   [provider]  Donnal Debar 200-62.5-25 MCG/ACT AEPB Inhale 1 puff into the lungs daily. 01/03/22   [provider]  Vitamin D, Ergocalciferol, (DRISDOL) 1.25 MG (50000 UNIT) CAPS capsule Take 50,000 Units by mouth 2 (two) times a week. 06/02/20   [provider]    Physical Exam: BP 133/84   Pulse 60   Temp 98.7 F (37.1 C) (Oral)   Resp 16   Ht 5\' 10"  (1.778 m)   Wt 86.2 kg   SpO2 100%   BMI 27.26 kg/m   General: 66 y.o. year-old male well developed well nourished in no acute distress.  Alert and oriented x3. HEENT: NCAT, EOMI Neck: Supple, trachea medial Cardiovascular: Intermittent bradycardia.  Irregular rate and rhythm with no rubs or gallops.  No thyromegaly or JVD noted.  No lower extremity edema. 2/4 pulses in all 4 extremities. Respiratory: Tachypnea.  Decreased breath sounds bilaterally with faint expiratory wheezes.  Abdomen: Soft, nontender nondistended with normal bowel sounds x4 quadrants. Muskuloskeletal: No cyanosis, clubbing or edema noted bilaterally Neuro: CN II-XII intact, strength 5/5 x 4, sensation, reflexes intact Skin: No ulcerative lesions noted or rashes Psychiatry: Judgement and insight appear normal. Mood is appropriate for condition and setting          Labs on Admission:  Basic Metabolic Panel: Recent Labs  Lab 06/04/22 1915  NA 134*  K 2.7*  CL 93*  CO2 28  GLUCOSE 232*  BUN 38*  CREATININE 2.05*  CALCIUM 8.6*   Liver Function Tests: No results for input(s): "AST", "ALT", "ALKPHOS", "BILITOT", "PROT", "ALBUMIN" in the last 168 hours. No results  for input(s): "LIPASE", "AMYLASE" in the last 168 hours. No results for input(s): "AMMONIA" in the last 168 hours. CBC: Recent Labs  Lab 06/04/22 1915  WBC 29.2*  NEUTROABS 28.3*  HGB 11.6*  HCT 35.1*  MCV 85.8  PLT 622*   Cardiac Enzymes: No results for input(s): "CKTOTAL", "CKMB", "CKMBINDEX", "TROPONINI" in the last 168 hours.  BNP (last 3 results) No results for input(s): "BNP" in the last 8760 hours.  ProBNP (last 3 results) No results for input(s): "PROBNP" in the last 8760 hours.  CBG: No results for input(s): "GLUCAP" in the last 168 hours.  Radiological Exams on Admission: CT Chest Wo Contrast  Result Date: 06/04/2022 CLINICAL DATA:  Pneumonia, complication suspected, xray done EXAM: CT CHEST WITHOUT CONTRAST TECHNIQUE: Multidetector CT imaging of the chest was performed following the standard protocol without IV contrast. RADIATION DOSE REDUCTION: This exam was performed according to the departmental dose-optimization program which includes automated exposure control, adjustment of the mA and/or kV according to patient size and/or use of iterative reconstruction technique. COMPARISON:  12/10/2021. FINDINGS: Cardiovascular: Moderate coronary artery and aortic calcifications. Heart is normal size. Aorta  is normal caliber. Mediastinum/Nodes: No mediastinal, hilar, or axillary adenopathy. Trachea and esophagus are unremarkable. Thyroid unremarkable. Lungs/Pleura: Moderate loculated left pleural effusion. Airspace disease in the left lower lobe and to a lesser extent lingula which could reflect atelectasis or pneumonia. Right lung clear. Upper Abdomen: Calcifications in the liver and spleen compatible with old granulomatous disease. Musculoskeletal: Chest wall soft tissues are unremarkable. No acute bony abnormality. IMPRESSION: Moderate loculated left pleural effusion. Airspace disease in the left lower lobe concerning for pneumonia. Lingular opacity likely reflects atelectasis. Old  granulomatous disease. Coronary artery disease. Aortic Atherosclerosis (ICD10-I70.0). Electronically Signed   By: Charlett Nose M.D.   On: 06/04/2022 21:54    EKG: I independently viewed the EKG done and my findings are as followed: Possible ectopic atrial rhythm with undetermined rhythm irregularity at rate of 90 bpm  Assessment/Plan Present on Admission:  CAP (community acquired pneumonia)  COPD (chronic obstructive pulmonary disease) (HCC)  Leukocytosis  Principal Problem:   CAP (community acquired pneumonia) Active Problems:   COPD (chronic obstructive pulmonary disease) (HCC)   Leukocytosis   Thrombocytosis   AKI (acute kidney injury) (HCC)   Hyperglycemia   Hyponatremia   Hypokalemia   Restless leg syndrome   Mixed hyperlipidemia   CAP POA CT was suggestive of left lower lobe pneumonia Patient was started on ceftriaxone and azithromycin, we shall continue same at this time with plan to de-escalate/discontinue based on blood culture, sputum culture, urine Legionella, strep pneumo and procalcitonin Continue Tylenol as needed Continue Mucinex, incentive spirometry, flutter valve   Left-sided loculated pleural effusion CT chest without contrast showed moderate loculated left pleural effusion IR will be consulted for possible thoracentesis in the morning  Acute kidney injury BUN/creatinine 30/2.05 (baseline creatinine at 1.1-1.2) Renally adjust medications, avoid nephrotoxic agents/dehydration/hypotension  Lactic acidosis-resolved Lactic acid 2.3 > 1.8  Hyponatremia Na 134, IV hydration was provided  Hypokalemia K+ is 2.7 K+ will be replenished Please monitor for AM K+ for further replenishmemnt  Chronic leukocytosis/thrombocytosis WBC 29.2 stable Platelets 622, stable  Hyperglycemia Blood sugar at 232, no history of T2DM Hemoglobin A1c will be checked  RLS Continue ropinirole  COPD Continue albuterol, Combivent, Dulera  Essential hypertension Continue  Cardizem, benazepril  Mixed hyperlipidemia Continue Lipitor  DVT prophylaxis: SCDs (consider starting chemoprophylaxis s/p possible thoracentesis)  Code Status: Full code  Consults: None  Family Communication: None at bedside  Severity of Illness: The appropriate patient status for this patient is INPATIENT. Inpatient status is judged to be reasonable and necessary in order to provide the required intensity of service to ensure the patient's safety. The patient's presenting symptoms, physical exam findings, and initial radiographic and laboratory data in the context of their chronic comorbidities is felt to place them at high risk for further clinical deterioration. Furthermore, it is not anticipated that the patient will be medically stable for discharge from the hospital within 2 midnights of admission.   * I certify that at the point of admission it is my clinical judgment that the patient will require inpatient hospital care spanning beyond 2 midnights from the point of admission due to high intensity of service, high risk for further deterioration and high frequency of surveillance required.*  Author: Frankey Shown, DO 06/05/2022 3:48 AM  For on call review [email or web address].

## 2022-06-04 NOTE — ED Triage Notes (Addendum)
Cough/sob x 4 days. Decreased to left [place]/rubs to right Pyle noted with ausculation. Mild labored breathing noted. Sent from [facility] Unable to complete long sentences. Pt c/o left side and back pain. Diaphoretic in triage Covid and flu test today was negative. CXRAY stated left opacity may represent acute infectious process. EDp aware

## 2022-06-04 NOTE — ED Provider Notes (Addendum)
[facility] EMERGENCY DEPARTMENT Provider Note   CSN: [id number] Arrival date & time: 06/04/22  1812     History  Chief Complaint  Patient presents with   Cough   Shortness of Breath    David Callahan is a 66 y.o. male w/ COPD who is sent from clinic p/w by personal vehicle for SOB, cough.   Patient presents with a daughter at bedside complaining of cough and shortness of breath for 4 days that is worsening.  No history of similar.  Denies any chest pain, palpitations, abdominal pain, nausea vomiting diarrhea constipation, or fever but does state that he was diaphoretic earlier.  Patient was seen today at [facility] for cough, chest congestion. Had negative covid/flu swabs. Was given steroids for possible COPD exacerbation but CXR "concerning for infectious process or malignancy" and was sent to ED for further evaluation.  No history of MI/stent placement or malignancy.  Does not take any anticoagulation.    Cough Associated symptoms: shortness of breath   Shortness of Breath Associated symptoms: cough        Home Medications Prior to Admission medications   Medication Sig Start Date End Date Taking? Authorizing Provider  albuterol (VENTOLIN HFA) 108 (90 Base) MCG/ACT inhaler Inhale 2 puffs into the lungs every 6 (six) hours as needed for shortness of breath.    [provider]  atorvastatin (LIPITOR) 40 MG tablet Take 40 mg by mouth daily.    [provider]  benazepril (LOTENSIN) 40 MG tablet Take 40 mg by mouth daily. 09/18/20   [provider]  budesonide-formoterol (SYMBICORT) 160-4.5 MCG/ACT inhaler Inhale 2 puffs into the lungs daily as needed (short of breath).    [provider]  cyclobenzaprine (FLEXERIL) 10 MG tablet Take 10 mg by mouth 3 (three) times daily. 09/17/20   [provider]  diltiazem (CARDIZEM CD) 300 MG 24 hr capsule Take 300 mg by mouth daily. 09/18/20   [provider]  furosemide  (LASIX) 20 MG tablet     [provider]  hydrochlorothiazide (HYDRODIURIL) 25 MG tablet Take 25 mg by mouth daily.    [provider]  HYDROcodone-acetaminophen (NORCO/VICODIN) 5-325 MG tablet Take 2 tablets by mouth every 6 (six) hours as needed for moderate pain or severe pain. 08/17/20   [provider]  hydrocortisone 2.5 % cream  03/09/18   [provider]  Ipratropium-Albuterol (COMBIVENT RESPIMAT) 20-100 MCG/ACT AERS respimat Inhale 1 puff into the lungs every 6 (six) hours as needed for wheezing. 10/08/20   Sherryll Burger, Pratik D, DO  naproxen sodium (ALEVE) 220 MG tablet Take 440 mg by mouth daily as needed.    [provider]  rOPINIRole (REQUIP) 0.25 MG tablet Take 0.75 mg by mouth at bedtime. 09/18/20   [provider]  terazosin (HYTRIN) 10 MG capsule Take 10 mg by mouth daily. 09/26/20   [provider]  traZODone (DESYREL) 50 MG tablet Take 50 mg by mouth at bedtime. 09/26/20   [provider]  Dwyane Luo 200-62.5-25 MCG/ACT AEPB Inhale 1 puff into the lungs daily. 01/03/22   [provider]  Vitamin D, Ergocalciferol, (DRISDOL) 1.25 MG (50000 UNIT) CAPS capsule Take 50,000 Units by mouth 2 (two) times a week. 06/02/20   [provider]      Allergies    Motrin [ibuprofen]    Review of Systems   Review of Systems  Respiratory:  Positive for cough and shortness of breath.  Physical Exam Updated Vital Signs BP 118/71   Pulse (!) 28   Temp 98.7 F (37.1 C) (Oral)   Resp (!) 22   Ht 5\' 10"  (1.778 m)   Wt 86.2 kg   SpO2 94%   BMI 27.26 kg/m  Physical Exam General: Alert male, lying in bed, older than stated age HEENT: Sclera anicteric, MMM, trachea midline, no JVD, poor dentition. Cardiology: tachycardic irregular rhythm, no murmurs/rubs/gallops. BL radial and DP pulses equal bilaterally.  Resp: Normal respiratory effort, slightly tachypneic. Expiratory wheezing on R [place]. Poor air  movement in L [place].   Abd: Soft, non-tender, non-distended. No rebound tenderness or guarding.  GU: Deferred. MSK: No peripheral edema or signs of trauma. Extremities without deformity or TTP. No cyanosis or clubbing. Skin: warm, dry. No rashes or lesions. Neuro: A&Ox4, CNs II-XII grossly intact. MAEs. Sensation grossly intact.  Psych: Normal mood and affect.   ED Results / Procedures / Treatments   Labs (all labs ordered are listed, but only abnormal results are displayed) Labs Reviewed  CBC WITH DIFFERENTIAL/PLATELET - Abnormal; Notable for the following components:      Result Value   WBC 29.2 (*)    RBC 4.09 (*)    Hemoglobin 11.6 (*)    HCT 35.1 (*)    RDW 17.7 (*)    Platelets 622 (*)    Neutro Abs 28.3 (*)    Lymphs Abs 0.6 (*)    All other components within normal limits  BASIC METABOLIC PANEL - Abnormal; Notable for the following components:   Sodium 134 (*)    Potassium 2.7 (*)    Chloride 93 (*)    Glucose, Bld 232 (*)    BUN 38 (*)    Creatinine, Ser 2.05 (*)    Calcium 8.6 (*)    GFR, Estimated 35 (*)    All other components within normal limits  LACTIC ACID, PLASMA - Abnormal; Notable for the following components:   Lactic Acid, Venous 2.3 (*)    All other components within normal limits  PROTIME-INR - Abnormal; Notable for the following components:   Prothrombin Time 17.0 (*)    INR 1.4 (*)    All other components within normal limits  BLOOD GAS, VENOUS - Abnormal; Notable for the following components:   pO2, Ven <31 (*)    Bicarbonate 31.6 (*)    Acid-Base Excess 4.8 (*)    All other components within normal limits  CULTURE, BLOOD (ROUTINE X 2)  CULTURE, BLOOD (ROUTINE X 2)  LACTIC ACID, PLASMA    EKG EKG Interpretation  Date/Time:  Wednesday June 04 2022 18:47:22 EDT Ventricular Rate:  90 PR Interval:  152 QRS Duration: 92 QT Interval:  350 QTC Calculation: 428 R Axis:   62 Text Interpretation: Unusual P axis, possible ectopic atrial  rhythm with undetermined rhythm irregularity Nonspecific ST abnormality Abnormal ECG When compared with ECG of 07-Oct-2020 14:52, PREVIOUS ECG IS PRESENT Confirmed by 09-Oct-2020 [phone]) on 06/04/2022 10:34:34 PM    Radiology CT Chest Wo Contrast  Result Date: 06/04/2022 CLINICAL DATA:  Pneumonia, complication suspected, xray done EXAM: CT CHEST WITHOUT CONTRAST TECHNIQUE: Multidetector CT imaging of the chest was performed following the standard protocol without IV contrast. RADIATION DOSE REDUCTION: This exam was performed according to the departmental dose-optimization program which includes automated exposure control, adjustment of the mA and/or kV according to patient size and/or use of iterative reconstruction technique. COMPARISON:  12/10/2021. FINDINGS: Cardiovascular: Moderate coronary artery and aortic calcifications.  Heart is normal size. Aorta is normal caliber. Mediastinum/Nodes: No mediastinal, hilar, or axillary adenopathy. Trachea and esophagus are unremarkable. Thyroid unremarkable. Lungs/Pleura: Moderate loculated left pleural effusion. Airspace disease in the left lower lobe and to a lesser extent lingula which could reflect atelectasis or pneumonia. Right lung clear. Upper Abdomen: Calcifications in the liver and spleen compatible with old granulomatous disease. Musculoskeletal: Chest wall soft tissues are unremarkable. No acute bony abnormality. IMPRESSION: Moderate loculated left pleural effusion. Airspace disease in the left lower lobe concerning for pneumonia. Lingular opacity likely reflects atelectasis. Old granulomatous disease. Coronary artery disease. Aortic Atherosclerosis (ICD10-I70.0). Electronically Signed   By: Charlett Nose M.D.   On: 06/04/2022 21:54    Procedures .Critical Care  Performed by: Loetta Rough, MD Authorized by: Loetta Rough, MD   Critical care provider statement:    Critical care time (minutes):  30   Critical care was necessary to treat or  prevent imminent or life-threatening deterioration of the following conditions:  Sepsis   Critical care was time spent personally by me on the following activities:  Development of treatment plan with patient or surrogate, discussions with consultants, evaluation of patient's response to treatment, examination of patient, ordering and review of laboratory studies, ordering and review of radiographic studies, ordering and performing treatments and interventions, pulse oximetry, re-evaluation of patient's condition and review of old charts   Care discussed with: admitting provider       Medications Ordered in ED Medications  cefTRIAXone (ROCEPHIN) 2 g in sodium chloride 0.9 % 100 mL IVPB (0 g Intravenous Stopped 06/04/22 2146)  azithromycin (ZITHROMAX) 500 mg in sodium chloride 0.9 % 250 mL IVPB (0 mg Intravenous Stopped 06/04/22 2146)  potassium chloride 10 mEq in 100 mL IVPB (10 mEq Intravenous New Bag/Given 06/04/22 2134)  lactated ringers bolus 500 mL (500 mLs Intravenous New Bag/Given 06/04/22 2145)    ED Course/ Medical Decision Making/ A&P                          Medical Decision Making Amount and/or Complexity of Data Reviewed Labs: ordered. Decision-making details documented in ED Course. Radiology: ordered. Decision-making details documented in ED Course.  Risk Prescription drug management. Decision regarding hospitalization.   Ddx for acute dyspnea is COPD exacerbation, pneumonia, pleural effusion, empyema. Poor air movement noted on L but CXR from clinic did not demonstrate e/o PTX.  Consider ACS or arrhythmia though patient denies CP and in s/o resp auscultation findings, etiology of symptoms more likely to be respiratory in nature. EKG does not demonstrate any STEs/Ds or e/o acute ischemia.    Clinical Course as of 06/04/22 2238  Wed Jun 04, 2022  2011 Potassium(!!): 2.7 Hypokalemia, will replete.  [HN]  2012 WBC(!): 29.2 Has received steroids recently but significant  leukocytosis [HN]  2012 Creatinine(!): 2.05 Acute AKI [HN]  2012 NEUT#(!): 28.3 Left shift [HN]  2112 pO2, Ven(!!): <31 On O2 satting 97%  [HN]  2112 Lactic Acid, Venous(!!): 2.3 Receiving fluid resuscitation [HN]  2113 Blood cultures drawn [HN]  2113 Receiving abx  [HN]  2216 CT Chest Wo Contrast Moderate loculated left pleural effusion. Airspace disease in the left lower lobe concerning for pneumonia. Lingular opacity likely reflects atelectasis.  Old granulomatous disease.  Coronary artery disease.  Aortic Atherosclerosis (ICD10-I70.0).   [HN]    Clinical Course User Index [HN] Loetta Rough, MD    Lab w/u significant for WBC 29.2 w/ left shift,  Hgb 11.6. K 2.7, repleting, with Cr 2.05 and lactate 2.3, treating with fluid resuscitation. Patient intermittently tachycardic to >100 bpm, stable on room air satting 94%. CT demonstrates LLL PNA w/ loculated parapneumonic effusion. Patient with sepsis likely from respiratory source, given ceftriaxone and azithromycin IV. Will need diagnostic thoracentesis in the AM. Consulted to hospitalist and admitted. HDS at time of admission.  Dispo: Admit         Final Clinical Impression(s) / ED Diagnoses Final diagnoses:  Community acquired pneumonia of left lung, unspecified part of lung  Parapneumonic effusion  Sepsis with acute organ dysfunction without septic shock, due to unspecified organism, unspecified type (HCC)  AKI (acute kidney injury) (HCC)  Hypokalemia    Rx / DC Orders ED Discharge Orders     None         Loetta Rough, MD 06/04/22 2238    Loetta Rough, MD 06/04/22 [phone]

## 2022-06-05 ENCOUNTER — Encounter (HOSPITAL_COMMUNITY): Payer: Self-pay | Admitting: Internal Medicine

## 2022-06-05 ENCOUNTER — Inpatient Hospital Stay (HOSPITAL_COMMUNITY): Payer: Medicare PPO

## 2022-06-05 DIAGNOSIS — G2581 Restless legs syndrome: Secondary | ICD-10-CM

## 2022-06-05 DIAGNOSIS — N179 Acute kidney failure, unspecified: Secondary | ICD-10-CM

## 2022-06-05 DIAGNOSIS — E876 Hypokalemia: Secondary | ICD-10-CM | POA: Diagnosis not present

## 2022-06-05 DIAGNOSIS — J189 Pneumonia, unspecified organism: Secondary | ICD-10-CM | POA: Diagnosis not present

## 2022-06-05 DIAGNOSIS — R739 Hyperglycemia, unspecified: Secondary | ICD-10-CM

## 2022-06-05 DIAGNOSIS — E782 Mixed hyperlipidemia: Secondary | ICD-10-CM

## 2022-06-05 DIAGNOSIS — D75839 Thrombocytosis, unspecified: Secondary | ICD-10-CM

## 2022-06-05 DIAGNOSIS — E871 Hypo-osmolality and hyponatremia: Secondary | ICD-10-CM

## 2022-06-05 DIAGNOSIS — J449 Chronic obstructive pulmonary disease, unspecified: Secondary | ICD-10-CM | POA: Diagnosis not present

## 2022-06-05 LAB — LACTATE DEHYDROGENASE, PLEURAL OR PERITONEAL FLUID: LD, Fluid: 612 U/L — ABNORMAL HIGH (ref 3–23)

## 2022-06-05 LAB — BODY FLUID CELL COUNT WITH DIFFERENTIAL
Eos, Fluid: 0 %
Lymphs, Fluid: 20 %
Monocyte-Macrophage-Serous Fluid: 13 % — ABNORMAL LOW (ref 50–90)
Neutrophil Count, Fluid: 67 % — ABNORMAL HIGH (ref 0–25)
Total Nucleated Cell Count, Fluid: 1319 cu mm — ABNORMAL HIGH (ref 0–1000)

## 2022-06-05 LAB — PROTEIN, PLEURAL OR PERITONEAL FLUID: Total protein, fluid: 4.9 g/dL

## 2022-06-05 LAB — COMPREHENSIVE METABOLIC PANEL
ALT: 24 U/L (ref 0–44)
AST: 35 U/L (ref 15–41)
Albumin: 2.2 g/dL — ABNORMAL LOW (ref 3.5–5.0)
Alkaline Phosphatase: 83 U/L (ref 38–126)
Anion gap: 9 (ref 5–15)
BUN: 35 mg/dL — ABNORMAL HIGH (ref 8–23)
CO2: 27 mmol/L (ref 22–32)
Calcium: 8.5 mg/dL — ABNORMAL LOW (ref 8.9–10.3)
Chloride: 98 mmol/L (ref 98–111)
Creatinine, Ser: 1.72 mg/dL — ABNORMAL HIGH (ref 0.61–1.24)
GFR, Estimated: 44 mL/min — ABNORMAL LOW (ref >60)
Glucose, Bld: 150 mg/dL — ABNORMAL HIGH (ref 70–99)
Potassium: 3.7 mmol/L (ref 3.5–5.1)
Sodium: 134 mmol/L — ABNORMAL LOW (ref 135–145)
Total Bilirubin: 0.8 mg/dL (ref 0.3–1.2)
Total Protein: 6.6 g/dL (ref 6.5–8.1)

## 2022-06-05 LAB — CBC
HCT: 32 % — ABNORMAL LOW (ref 39.0–52.0)
Hemoglobin: 10.5 g/dL — ABNORMAL LOW (ref 13.0–17.0)
MCH: 28.7 pg (ref 26.0–34.0)
MCHC: 32.8 g/dL (ref 30.0–36.0)
MCV: 87.4 fL (ref 80.0–100.0)
Platelets: 538 10*3/uL — ABNORMAL HIGH (ref 150–400)
RBC: 3.66 MIL/uL — ABNORMAL LOW (ref 4.22–5.81)
RDW: 17.7 % — ABNORMAL HIGH (ref 11.5–15.5)
WBC: 28 10*3/uL — ABNORMAL HIGH (ref 4.0–10.5)
nRBC: 0 % (ref 0.0–0.2)

## 2022-06-05 LAB — GRAM STAIN

## 2022-06-05 LAB — GLUCOSE, PLEURAL OR PERITONEAL FLUID: Glucose, Fluid: 128 mg/dL

## 2022-06-05 LAB — STREP PNEUMONIAE URINARY ANTIGEN: Strep Pneumo Urinary Antigen: NEGATIVE

## 2022-06-05 LAB — HEMOGLOBIN A1C
Hgb A1c MFr Bld: 5.4 % (ref 4.8–5.6)
Mean Plasma Glucose: 108.28 mg/dL

## 2022-06-05 LAB — HIV ANTIBODY (ROUTINE TESTING W REFLEX): HIV Screen 4th Generation wRfx: NONREACTIVE

## 2022-06-05 MED ORDER — DM-GUAIFENESIN ER 30-600 MG PO TB12
1.0000 | ORAL_TABLET | Freq: Two times a day (BID) | ORAL | Status: DC
  Administered 2022-06-05 – 2022-06-07 (×5): 1 via ORAL
  Filled 2022-06-05 (×5): qty 1

## 2022-06-05 MED ORDER — LIDOCAINE HCL (PF) 2 % IJ SOLN
INTRAMUSCULAR | Status: AC
  Filled 2022-06-05: qty 10

## 2022-06-05 MED ORDER — ROPINIROLE HCL 0.25 MG PO TABS
0.7500 mg | ORAL_TABLET | Freq: Every day | ORAL | Status: DC
  Administered 2022-06-05 – 2022-06-06 (×2): 0.75 mg via ORAL
  Filled 2022-06-05 (×2): qty 3

## 2022-06-05 MED ORDER — BENAZEPRIL HCL 20 MG PO TABS
40.0000 mg | ORAL_TABLET | Freq: Every day | ORAL | Status: DC
  Administered 2022-06-05 – 2022-06-07 (×3): 40 mg via ORAL
  Filled 2022-06-05 (×3): qty 2

## 2022-06-05 MED ORDER — ACETAMINOPHEN 325 MG PO TABS
650.0000 mg | ORAL_TABLET | Freq: Four times a day (QID) | ORAL | Status: DC | PRN
  Administered 2022-06-06: 650 mg via ORAL
  Filled 2022-06-05: qty 2

## 2022-06-05 MED ORDER — IPRATROPIUM-ALBUTEROL 20-100 MCG/ACT IN AERS: 1.0000 | INHALATION_SPRAY | Freq: Four times a day (QID) | RESPIRATORY_TRACT | Status: DC | PRN

## 2022-06-05 MED ORDER — DILTIAZEM HCL ER COATED BEADS 180 MG PO CP24
300.0000 mg | ORAL_CAPSULE | Freq: Every day | ORAL | Status: DC
  Administered 2022-06-05 – 2022-06-07 (×3): 300 mg via ORAL
  Filled 2022-06-05 (×3): qty 1

## 2022-06-05 MED ORDER — ATORVASTATIN CALCIUM 40 MG PO TABS
40.0000 mg | ORAL_TABLET | Freq: Every day | ORAL | Status: DC
  Administered 2022-06-05 – 2022-06-07 (×3): 40 mg via ORAL
  Filled 2022-06-05 (×3): qty 1

## 2022-06-05 MED ORDER — ALBUTEROL SULFATE HFA 108 (90 BASE) MCG/ACT IN AERS: 2.0000 | INHALATION_SPRAY | Freq: Four times a day (QID) | RESPIRATORY_TRACT | Status: DC | PRN

## 2022-06-05 MED ORDER — ENOXAPARIN SODIUM 40 MG/0.4ML IJ SOSY: 40.0000 mg | PREFILLED_SYRINGE | INTRAMUSCULAR | Status: DC

## 2022-06-05 MED ORDER — MOMETASONE FURO-FORMOTEROL FUM 200-5 MCG/ACT IN AERO
2.0000 | INHALATION_SPRAY | Freq: Two times a day (BID) | RESPIRATORY_TRACT | Status: DC
  Administered 2022-06-05 – 2022-06-07 (×5): 2 via RESPIRATORY_TRACT
  Filled 2022-06-05: qty 8.8

## 2022-06-05 MED ORDER — ACETAMINOPHEN 650 MG RE SUPP: 650.0000 mg | Freq: Four times a day (QID) | RECTAL | Status: DC | PRN

## 2022-06-05 NOTE — Progress Notes (Signed)
PROGRESS NOTE    David Callahan  WUJ:[id number] DOB: March 17, 1956 DOA: 06/04/2022 PCP: Patient, No Pcp Per    Chief Complaint  Patient presents with   Cough   Shortness of Breath    Brief Narrative:  ELL TISO is a 66 y.o. male with medical history significant of COPD, hyperlipidemia, hypertension, RLS who presents to the emergency department after being sent from PCPs office.  Patient complains of 4-day onset of worsening shortness of breath which was associated with productive cough of clear phlegm.  He denies fever, chest pain, nausea or vomiting.  He went to [facility] due to cough and chest congestion.  COVID and flu test done was negative and patient was treated with steroids for possible acute exacerbation of COPD.  Chest x-ray done was concerning for infectious process/malignancy and was referred to ED for further evaluation.  CT chest without contrast showed moderate loculated left pleural effusion. Airspace disease in the left lower lobe concerning for pneumonia. Lingular opacity likely reflects atelectasis. Patient was treated with IV ceftriaxone and azithromycin, IV hydration was provided, potassium was replenished.  Hospitalist was asked to admit patient for further evaluation and management.   Assessment & Plan:   Principal Problem:   CAP (community acquired pneumonia) Active Problems:   COPD (chronic obstructive pulmonary disease) (HCC)   Leukocytosis   Thrombocytosis   AKI (acute kidney injury) (HCC)   Hyperglycemia   Hyponatremia   Hypokalemia   Restless leg syndrome   Mixed hyperlipidemia   Community-acquired pneumonia present on admission with associated left-sided loculated pleural effusion. CT of the chest without contrast shows moderate loculated left pleural effusion. Patient was started on IV Rocephin and Zithromax. Follow blood cultures, sputum cultures.  Urine Legionella and strep pneumonia antigen are pending. IR consulted for  possible thoracentesis today. Lactic acid has normalized, mild improvement in leukocytosis.    Acute kidney injury Patient's baseline creatinine around 1.2 admitted with a creatinine of 2.05 After adequate hydration creatinine has improved to 1.7.   Normocytic anemia Continue to follow.   Thrombocytosis  probably secondary to infection.    Hypokalemia Replaced.   COPD No wheezing heard on exam continue with bronchodilators as needed.   Essential hypertension  blood pressure parameters appear to be optimal  continue with Cardizem and benazepril.   Restless leg syndrome On Requip .   Hyperlipidemia Continue with Lipitor.   DVT prophylaxis: (Lovenox Code Status: (Full code) Family Communication: (none at bedside.  Disposition:   Status is: Inpatient Remains inpatient appropriate because: IV antibiotics.    Level of care: Med-Surg Consultants:  None.   Procedures: CT chest.   Antimicrobials: IV antibiotics for pneumonia.    Subjective: Coughing , some relief since yesterday.   Objective: Vitals:   06/05/22 0803 06/05/22 0900 06/05/22 0916 06/05/22 0920  BP:  129/83 (!) 147/89 (!) 155/71  Pulse:  70 97 68  Resp:  18 (!) 21 17  Temp:      TempSrc:      SpO2: 98% 97% 99% 99%  Weight:      Height:        Intake/Output Summary (Last 24 hours) at 06/05/2022 0928 Last data filed at 06/05/2022 0212 Gross per 24 hour  Intake 1250 ml  Output --  Net 1250 ml   Filed Weights   06/04/22 1831  Weight: 86.2 kg    Examination:  General exam: Appears calm and comfortable  Respiratory system: diminished air entry at bases. On  2 lit of [place] oxygen.  Cardiovascular system: S1 & S2 heard, RRR. No JVD, No pedal edema. Gastrointestinal system: Abdomen is nondistended, soft and nontender. Normal bowel sounds heard. Central nervous system: Alert and oriented. No focal neurological deficits. Extremities: Symmetric 5 x 5 power. Skin: No rashes, lesions or  ulcers Psychiatry: Mood & affect appropriate.     Data Reviewed: I have personally reviewed following labs and imaging studies  CBC: Recent Labs  Lab 06/04/22 1915 06/05/22 0503  WBC 29.2* 28.0*  NEUTROABS 28.3*  --   HGB 11.6* 10.5*  HCT 35.1* 32.0*  MCV 85.8 87.4  PLT 622* 538*    Basic Metabolic Panel: Recent Labs  Lab 06/04/22 1915 06/05/22 0503  NA 134* 134*  K 2.7* 3.7  CL 93* 98  CO2 28 27  GLUCOSE 232* 150*  BUN 38* 35*  CREATININE 2.05* 1.72*  CALCIUM 8.6* 8.5*    GFR: Estimated Creatinine Clearance: 44.2 mL/min (A) (by C-G formula based on SCr of 1.72 mg/dL (H)).  Liver Function Tests: Recent Labs  Lab 06/05/22 0503  AST 35  ALT 24  ALKPHOS 83  BILITOT 0.8  PROT 6.6  ALBUMIN 2.2*    CBG: No results for input(s): "GLUCAP" in the last 168 hours.   Recent Results (from the past 240 hour(s))  Blood culture (routine x 2)     Status: None (Preliminary result)   Collection Time: 06/04/22  8:15 PM   Specimen: BLOOD  Result Value Ref Range Status   Specimen Description BLOOD RIGHT ANTECUBITAL  Final   Special Requests   Final    BOTTLES DRAWN AEROBIC AND ANAEROBIC Blood Culture results may not be optimal due to an excessive volume of blood received in culture bottles   Culture   Final    NO GROWTH < 12 HOURS Performed at [facility], [address], [place], [place] [address]    Report Status PENDING  Incomplete  Blood culture (routine x 2)     Status: None (Preliminary result)   Collection Time: 06/04/22  8:32 PM   Specimen: BLOOD  Result Value Ref Range Status   Specimen Description BLOOD RIGHT ANTECUBITAL  Final   Special Requests   Final    BOTTLES DRAWN AEROBIC AND ANAEROBIC Blood Culture results may not be optimal due to an excessive volume of blood received in culture bottles   Culture   Final    NO GROWTH < 12 HOURS Performed at [facility], [address], [place], [place] [address]    Report Status PENDING  Incomplete          Radiology Studies: CT Chest Wo Contrast  Result Date: 06/04/2022 CLINICAL DATA:  Pneumonia, complication suspected, xray done EXAM: CT CHEST WITHOUT CONTRAST TECHNIQUE: Multidetector CT imaging of the chest was performed following the standard protocol without IV contrast. RADIATION DOSE REDUCTION: This exam was performed according to the departmental dose-optimization program which includes automated exposure control, adjustment of the mA and/or kV according to patient size and/or use of iterative reconstruction technique. COMPARISON:  12/10/2021. FINDINGS: Cardiovascular: Moderate coronary artery and aortic calcifications. Heart is normal size. Aorta is normal caliber. Mediastinum/Nodes: No mediastinal, hilar, or axillary adenopathy. Trachea and esophagus are unremarkable. Thyroid unremarkable. Lungs/Pleura: Moderate loculated left pleural effusion. Airspace disease in the left lower lobe and to a lesser extent lingula which could reflect atelectasis or pneumonia. Right lung clear. Upper Abdomen: Calcifications in the liver and spleen compatible with old granulomatous disease. Musculoskeletal: Chest wall soft  tissues are unremarkable. No acute bony abnormality. IMPRESSION: Moderate loculated left pleural effusion. Airspace disease in the left lower lobe concerning for pneumonia. Lingular opacity likely reflects atelectasis. Old granulomatous disease. Coronary artery disease. Aortic Atherosclerosis (ICD10-I70.0). Electronically Signed   By: Charlett Nose M.D.   On: 06/04/2022 21:54        Scheduled Meds:  atorvastatin  40 mg Oral Daily   benazepril  40 mg Oral Daily   dextromethorphan-guaiFENesin  1 tablet Oral BID   diltiazem  300 mg Oral Daily   mometasone-formoterol  2 puff Inhalation BID   rOPINIRole  0.75 mg Oral QHS   Continuous Infusions:  azithromycin Stopped (06/04/22 2146)   cefTRIAXone (ROCEPHIN)  IV Stopped (06/04/22 2146)     LOS: 1 day    Time spent: 39 minutes.     Kathlen Mody, MD [facility]   To contact the attending provider between 7A-7P or the covering provider during after hours 7P-7A, please log into the web site [email or web address] and access using universal [place] password for that web site. If you do not have the password, please call the hospital operator.  06/05/2022, 9:28 AM

## 2022-06-05 NOTE — TOC Progression Note (Signed)
  Transition of Care [facility]) Screening Note   Patient Details  Name: David Callahan Date of Birth: Apr 05, 1956   Transition of Care [facility]) CM/SW Contact:    Elliot Gault, LCSW Phone Number: 06/05/2022, 12:09 PM    Transition of Care Department [facility]) has reviewed patient and no TOC needs have been identified at this time. We will continue to monitor patient advancement through interdisciplinary progression rounds. If new patient transition needs arise, please place a TOC consult.

## 2022-06-05 NOTE — Procedures (Signed)
   US guided Left thoracentesis Loculated effusion  650 cc yellow fluid Sent for labs per MD  Tolerated well EBL: None  Post CXR:  No PTX per Dr Tyron Russell

## 2022-06-05 NOTE — Progress Notes (Signed)
PT tolerated left sided thoracentesis procedure well today and 650 mL of clear yellow pleural fluid removed and sent to lab for processing. After post chest xray completed and read by radiologist pt transported back to ED bed assignment via stretcher with no acute distress noted. PT verbalized understanding of post procedure instructions.

## 2022-06-06 DIAGNOSIS — E876 Hypokalemia: Secondary | ICD-10-CM | POA: Diagnosis not present

## 2022-06-06 DIAGNOSIS — J189 Pneumonia, unspecified organism: Secondary | ICD-10-CM | POA: Diagnosis not present

## 2022-06-06 DIAGNOSIS — J449 Chronic obstructive pulmonary disease, unspecified: Secondary | ICD-10-CM | POA: Diagnosis not present

## 2022-06-06 DIAGNOSIS — N179 Acute kidney failure, unspecified: Secondary | ICD-10-CM | POA: Diagnosis not present

## 2022-06-06 LAB — COMPREHENSIVE METABOLIC PANEL
ALT: 73 U/L — ABNORMAL HIGH (ref 0–44)
AST: 149 U/L — ABNORMAL HIGH (ref 15–41)
Albumin: 2 g/dL — ABNORMAL LOW (ref 3.5–5.0)
Alkaline Phosphatase: 79 U/L (ref 38–126)
Anion gap: 11 (ref 5–15)
BUN: 30 mg/dL — ABNORMAL HIGH (ref 8–23)
CO2: 28 mmol/L (ref 22–32)
Calcium: 8.4 mg/dL — ABNORMAL LOW (ref 8.9–10.3)
Chloride: 100 mmol/L (ref 98–111)
Creatinine, Ser: 1.31 mg/dL — ABNORMAL HIGH (ref 0.61–1.24)
GFR, Estimated: >60 mL/min (ref >60)
Glucose, Bld: 180 mg/dL — ABNORMAL HIGH (ref 70–99)
Potassium: 3.1 mmol/L — ABNORMAL LOW (ref 3.5–5.1)
Sodium: 139 mmol/L (ref 135–145)
Total Bilirubin: 0.6 mg/dL (ref 0.3–1.2)
Total Protein: 6.2 g/dL — ABNORMAL LOW (ref 6.5–8.1)

## 2022-06-06 LAB — CBC WITH DIFFERENTIAL/PLATELET
Abs Immature Granulocytes: 0.4 10*3/uL — ABNORMAL HIGH (ref 0.00–0.07)
Basophils Absolute: 0.1 10*3/uL (ref 0.0–0.1)
Basophils Relative: 0 %
Eosinophils Absolute: 0 10*3/uL (ref 0.0–0.5)
Eosinophils Relative: 0 %
HCT: 30.6 % — ABNORMAL LOW (ref 39.0–52.0)
Hemoglobin: 9.9 g/dL — ABNORMAL LOW (ref 13.0–17.0)
Immature Granulocytes: 2 %
Lymphocytes Relative: 9 %
Lymphs Abs: 2.4 10*3/uL (ref 0.7–4.0)
MCH: 28.6 pg (ref 26.0–34.0)
MCHC: 32.4 g/dL (ref 30.0–36.0)
MCV: 88.4 fL (ref 80.0–100.0)
Monocytes Absolute: 1.6 10*3/uL — ABNORMAL HIGH (ref 0.1–1.0)
Monocytes Relative: 6 %
Neutro Abs: 23.1 10*3/uL — ABNORMAL HIGH (ref 1.7–7.7)
Neutrophils Relative %: 83 %
Platelets: 554 10*3/uL — ABNORMAL HIGH (ref 150–400)
RBC: 3.46 MIL/uL — ABNORMAL LOW (ref 4.22–5.81)
RDW: 17.8 % — ABNORMAL HIGH (ref 11.5–15.5)
WBC: 27.6 10*3/uL — ABNORMAL HIGH (ref 4.0–10.5)
nRBC: 0 % (ref 0.0–0.2)

## 2022-06-06 LAB — LACTATE DEHYDROGENASE: LDH: 198 U/L — ABNORMAL HIGH (ref 98–192)

## 2022-06-06 LAB — LEGIONELLA PNEUMOPHILA SEROGP 1 UR AG: L. pneumophila Serogp 1 Ur Ag: NEGATIVE

## 2022-06-06 MED ORDER — POTASSIUM CHLORIDE CRYS ER 20 MEQ PO TBCR
40.0000 meq | EXTENDED_RELEASE_TABLET | Freq: Once | ORAL | Status: AC
  Administered 2022-06-06: 40 meq via ORAL
  Filled 2022-06-06: qty 2

## 2022-06-06 NOTE — Care Management Important Message (Signed)
Important Message  Patient Details  Name: David Callahan MRN: [id number] Date of Birth: 1956-10-07   Medicare Important Message Given:  Yes     Corey Harold 06/06/2022, 11:35 AM

## 2022-06-06 NOTE — Progress Notes (Signed)
PROGRESS NOTE    David Callahan  PJK:[id number] DOB: 08-24-1956 DOA: 06/04/2022 PCP: Patient, No Pcp Per    Chief Complaint  Patient presents with   Cough   Shortness of Breath    Brief Narrative:  David Callahan is a 66 y.o. male with medical history significant of COPD, hyperlipidemia, hypertension, RLS who presents to the emergency department after being sent from PCPs office.  Patient complains of 4-day onset of worsening shortness of breath which was associated with productive cough of clear phlegm.  He denies fever, chest pain, nausea or vomiting.  He went to [facility] due to cough and chest congestion.  COVID and flu test done was negative and patient was treated with steroids for possible acute exacerbation of COPD.  Chest x-ray done was concerning for infectious process/malignancy and was referred to ED for further evaluation.  CT chest without contrast showed moderate loculated left pleural effusion. Airspace disease in the left lower lobe concerning for pneumonia. Lingular opacity likely reflects atelectasis. Patient was treated with IV ceftriaxone and azithromycin, IV hydration was provided, potassium was replenished.  Hospitalist was asked to admit patient for further evaluation and management.   Assessment & Plan:   Principal Problem:   CAP (community acquired pneumonia) Active Problems:   COPD (chronic obstructive pulmonary disease) (HCC)   Leukocytosis   Thrombocytosis   AKI (acute kidney injury) (HCC)   Hyperglycemia   Hyponatremia   Hypokalemia   Restless leg syndrome   Mixed hyperlipidemia   Community-acquired pneumonia present on admission with associated left-sided loculated pleural effusion. CT of the chest without contrast shows moderate loculated left pleural effusion. Patient was started on IV Rocephin and Zithromax. Follow blood cultures, sputum cultures.  Urine Legionella pending.  and strep pneumonia antigen  is negative.  IR  consulted for  thoracentesis and 650 ml fluid taken out. Its exudative by Light's Criteria.  Lactic acid has normalized, mild improvement in leukocytosis. Leukocytosis is improving.     Acute kidney injury Patient's baseline creatinine around 1.2 admitted with a creatinine of 2.05 After adequate hydration creatinine has improved to 1.7 to 1.3.   Normocytic anemia Continue to follow.   Thrombocytosis  probably secondary to infection.    Hypokalemia Replaced. Recheck in am.    COPD No wheezing heard on exam continue with bronchodilators as needed. No wheezing or rhonchi.   Essential hypertension  blood pressure parameters appear to be optimal  continue with Cardizem and benazepril.   Restless leg syndrome On Requip .   Hyperlipidemia Continue with Lipitor.   DVT prophylaxis: (Lovenox Code Status: (Full code) Family Communication: (none at bedside.  Disposition:   Status is: Inpatient Remains inpatient appropriate because: IV antibiotics.    Level of care: Telemetry Consultants:  None.   Procedures: CT chest.   Antimicrobials: IV antibiotics for pneumonia.    Subjective: Improving, but not ready for discharge.   Objective: Vitals:   06/06/22 0620 06/06/22 0804 06/06/22 0926 06/06/22 1218  BP: (!) 144/77 129/60  115/63  Pulse: (!) 54   (!) 59  Resp: 17   18  Temp: 97.9 F (36.6 C)   97.9 F (36.6 C)  TempSrc: Oral   Oral  SpO2: 98%  97% 96%  Weight:      Height:        Intake/Output Summary (Last 24 hours) at 06/06/2022 1232 Last data filed at 06/06/2022 0900 Gross per 24 hour  Intake 360 ml  Output --  Net 360 ml    Filed Weights   06/04/22 1831  Weight: 86.2 kg    Examination:  General exam: Appears calm and comfortable  Respiratory system: Clear to auscultation. Respiratory effort normal. Cardiovascular system: S1 & S2 heard, RRR. No JVD, murmurs, rubs, gallops or clicks. No pedal edema. Gastrointestinal system: Abdomen is  nondistended, soft and nontender. No organomegaly or masses felt. Normal bowel sounds heard. Central nervous system: Alert and oriented. No focal neurological deficits. Extremities: Symmetric 5 x 5 power. Skin: No rashes, lesions or ulcers Psychiatry: Judgement and insight appear normal. Mood & affect appropriate.     Data Reviewed: I have personally reviewed following labs and imaging studies  CBC: Recent Labs  Lab 06/04/22 1915 06/05/22 0503 06/06/22 1200  WBC 29.2* 28.0* 27.6*  NEUTROABS 28.3*  --  PENDING  HGB 11.6* 10.5* 9.9*  HCT 35.1* 32.0* 30.6*  MCV 85.8 87.4 88.4  PLT 622* 538* 554*     Basic Metabolic Panel: Recent Labs  Lab 06/04/22 1915 06/05/22 0503 06/06/22 1200  NA 134* 134* 139  K 2.7* 3.7 3.1*  CL 93* 98 100  CO2 28 27 28   GLUCOSE 232* 150* 180*  BUN 38* 35* 30*  CREATININE 2.05* 1.72* 1.31*  CALCIUM 8.6* 8.5* 8.4*     GFR: Estimated Creatinine Clearance: 58 mL/min (A) (by C-G formula based on SCr of 1.31 mg/dL (H)).  Liver Function Tests: Recent Labs  Lab 06/05/22 0503 06/06/22 1200  AST 35 149*  ALT 24 73*  ALKPHOS 83 79  BILITOT 0.8 0.6  PROT 6.6 6.2*  ALBUMIN 2.2* 2.0*     CBG: No results for input(s): "GLUCAP" in the last 168 hours.   Recent Results (from the past 240 hour(s))  Blood culture (routine x 2)     Status: None (Preliminary result)   Collection Time: 06/04/22  8:15 PM   Specimen: BLOOD  Result Value Ref Range Status   Specimen Description BLOOD RIGHT ANTECUBITAL  Final   Special Requests   Final    BOTTLES DRAWN AEROBIC AND ANAEROBIC Blood Culture results may not be optimal due to an excessive volume of blood received in culture bottles   Culture   Final    NO GROWTH 2 DAYS Performed at [facility], [address], [place], [place]    Report Status PENDING  Incomplete  Blood culture (routine x 2)     Status: None (Preliminary result)   Collection Time: 06/04/22  8:32 PM   Specimen: BLOOD  Result  Value Ref Range Status   Specimen Description BLOOD RIGHT ANTECUBITAL  Final   Special Requests   Final    BOTTLES DRAWN AEROBIC AND ANAEROBIC Blood Culture results may not be optimal due to an excessive volume of blood received in culture bottles   Culture   Final    NO GROWTH 2 DAYS Performed at [facility], [address], [place], [place]    Report Status PENDING  Incomplete  Gram stain     Status: None   Collection Time: 06/05/22  9:20 AM   Specimen: Pleura  Result Value Ref Range Status   Specimen Description PLEURAL LEFT  Final   Special Requests NONE  Final   Gram Stain   Final    NO ORGANISMS SEEN WBC PRESENT,BOTH PMN AND MONONUCLEAR CYTOSPIN SMEAR Performed at [facility], [address], [place], [place]    Report Status 06/05/2022 FINAL  Final  Culture, body fluid w  Gram Stain-bottle     Status: None (Preliminary result)   Collection Time: 06/05/22  9:20 AM   Specimen: Pleura  Result Value Ref Range Status   Specimen Description PLEURAL LEFT  Final   Special Requests BOTTLES DRAWN AEROBIC AND ANAEROBIC 10CC  Final   Culture   Final    NO GROWTH < 24 HOURS Performed at [facility], [address] [place], [place] [address]    Report Status PENDING  Incomplete         Radiology Studies: DG Chest 1 View  Result Date: 06/05/2022 CLINICAL DATA:  Partially loculated LEFT pleural effusion post thoracentesis EXAM: CHEST  1 VIEW COMPARISON:  12/10/2021 FINDINGS: Normal heart size, mediastinal contours, and pulmonary vascularity. Atherosclerotic calcification aorta. Small to moderate LEFT pleural effusion and basilar atelectasis. No pneumothorax following thoracentesis. RIGHT lung clear. Advanced RIGHT glenohumeral degenerative changes. IMPRESSION: No pneumothorax following LEFT thoracentesis. Small to moderate LEFT pleural effusion and basilar atelectasis. Aortic Atherosclerosis (ICD10-I70.0). Electronically Signed   By: Ulyses Southward M.D.   On:  06/05/2022 09:59   US THORACENTESIS ASP PLEURAL SPACE W/IMG GUIDE  Result Date: 06/05/2022 INDICATION: Left pleural effusion EXAM: ULTRASOUND GUIDED LEFT THORACENTESIS MEDICATIONS: 9 cc 1% lidocaine. COMPLICATIONS: None immediate. PROCEDURE: An ultrasound guided thoracentesis was thoroughly discussed with the patient and questions answered. The benefits, risks, alternatives and complications were also discussed. The patient understands and wishes to proceed with the procedure. Written consent was obtained. Ultrasound was performed to localize and mark an adequate pocket of fluid in the Left chest. The area was then prepped and draped in the normal sterile fashion. 1% Lidocaine was used for local anesthesia. Under ultrasound guidance a Yueh catheter was introduced. Thoracentesis was performed. The catheter was removed and a dressing applied. FINDINGS: A total of approximately 650 cc of yellow fluid was removed. Samples were sent to the laboratory as requested by the clinical team. IMPRESSION: Successful ultrasound guided left thoracentesis yielding 650 cc of pleural fluid. Post CXR: No PTX per Dr Tyron Russell Read by Robet Leu [facility] Electronically Signed   By: Ulyses Southward M.D.   On: 06/05/2022 09:52   CT Chest Wo Contrast  Result Date: 06/04/2022 CLINICAL DATA:  Pneumonia, complication suspected, xray done EXAM: CT CHEST WITHOUT CONTRAST TECHNIQUE: Multidetector CT imaging of the chest was performed following the standard protocol without IV contrast. RADIATION DOSE REDUCTION: This exam was performed according to the departmental dose-optimization program which includes automated exposure control, adjustment of the mA and/or kV according to patient size and/or use of iterative reconstruction technique. COMPARISON:  12/10/2021. FINDINGS: Cardiovascular: Moderate coronary artery and aortic calcifications. Heart is normal size. Aorta is normal caliber. Mediastinum/Nodes: No mediastinal, hilar, or axillary  adenopathy. Trachea and esophagus are unremarkable. Thyroid unremarkable. Lungs/Pleura: Moderate loculated left pleural effusion. Airspace disease in the left lower lobe and to a lesser extent lingula which could reflect atelectasis or pneumonia. Right lung clear. Upper Abdomen: Calcifications in the liver and spleen compatible with old granulomatous disease. Musculoskeletal: Chest wall soft tissues are unremarkable. No acute bony abnormality. IMPRESSION: Moderate loculated left pleural effusion. Airspace disease in the left lower lobe concerning for pneumonia. Lingular opacity likely reflects atelectasis. Old granulomatous disease. Coronary artery disease. Aortic Atherosclerosis (ICD10-I70.0). Electronically Signed   By: Charlett Nose M.D.   On: 06/04/2022 21:54        Scheduled Meds:  atorvastatin  40 mg Oral Daily   benazepril  40 mg Oral Daily   dextromethorphan-guaiFENesin  1 tablet  Oral BID   diltiazem  300 mg Oral Daily   mometasone-formoterol  2 puff Inhalation BID   potassium chloride  40 mEq Oral Once   rOPINIRole  0.75 mg Oral QHS   Continuous Infusions:  azithromycin 500 mg (06/05/22 2205)   cefTRIAXone (ROCEPHIN)  IV 2 g (06/05/22 2125)     LOS: 2 days    Time spent: 39 minutes.    Kathlen Mody, MD [facility]   To contact the attending provider between 7A-7P or the covering provider during after hours 7P-7A, please log into the web site [email or web address] and access using universal [place] password for that web site. If you do not have the password, please call the hospital operator.  06/06/2022, 12:32 PM

## 2022-06-07 DIAGNOSIS — N179 Acute kidney failure, unspecified: Secondary | ICD-10-CM | POA: Diagnosis not present

## 2022-06-07 DIAGNOSIS — J449 Chronic obstructive pulmonary disease, unspecified: Secondary | ICD-10-CM | POA: Diagnosis not present

## 2022-06-07 DIAGNOSIS — J189 Pneumonia, unspecified organism: Secondary | ICD-10-CM | POA: Diagnosis not present

## 2022-06-07 DIAGNOSIS — E876 Hypokalemia: Secondary | ICD-10-CM | POA: Diagnosis not present

## 2022-06-07 LAB — BASIC METABOLIC PANEL
Anion gap: 8 (ref 5–15)
BUN: 27 mg/dL — ABNORMAL HIGH (ref 8–23)
CO2: 27 mmol/L (ref 22–32)
Calcium: 8.3 mg/dL — ABNORMAL LOW (ref 8.9–10.3)
Chloride: 102 mmol/L (ref 98–111)
Creatinine, Ser: 1.27 mg/dL — ABNORMAL HIGH (ref 0.61–1.24)
GFR, Estimated: >60 mL/min (ref >60)
Glucose, Bld: 102 mg/dL — ABNORMAL HIGH (ref 70–99)
Potassium: 3.4 mmol/L — ABNORMAL LOW (ref 3.5–5.1)
Sodium: 137 mmol/L (ref 135–145)

## 2022-06-07 LAB — CBC WITH DIFFERENTIAL/PLATELET
Abs Immature Granulocytes: 0.28 10*3/uL — ABNORMAL HIGH (ref 0.00–0.07)
Basophils Absolute: 0.1 10*3/uL (ref 0.0–0.1)
Basophils Relative: 0 %
Eosinophils Absolute: 0 10*3/uL (ref 0.0–0.5)
Eosinophils Relative: 0 %
HCT: 31.6 % — ABNORMAL LOW (ref 39.0–52.0)
Hemoglobin: 10 g/dL — ABNORMAL LOW (ref 13.0–17.0)
Immature Granulocytes: 1 %
Lymphocytes Relative: 14 %
Lymphs Abs: 3.3 10*3/uL (ref 0.7–4.0)
MCH: 28.4 pg (ref 26.0–34.0)
MCHC: 31.6 g/dL (ref 30.0–36.0)
MCV: 89.8 fL (ref 80.0–100.0)
Monocytes Absolute: 1.9 10*3/uL — ABNORMAL HIGH (ref 0.1–1.0)
Monocytes Relative: 8 %
Neutro Abs: 17.9 10*3/uL — ABNORMAL HIGH (ref 1.7–7.7)
Neutrophils Relative %: 77 %
Platelets: 591 10*3/uL — ABNORMAL HIGH (ref 150–400)
RBC: 3.52 MIL/uL — ABNORMAL LOW (ref 4.22–5.81)
RDW: 18.4 % — ABNORMAL HIGH (ref 11.5–15.5)
WBC: 23.4 10*3/uL — ABNORMAL HIGH (ref 4.0–10.5)
nRBC: 0 % (ref 0.0–0.2)

## 2022-06-07 MED ORDER — AMOXICILLIN-POT CLAVULANATE 875-125 MG PO TABS: 1.0000 | ORAL_TABLET | Freq: Two times a day (BID) | ORAL | 0 refills | Status: AC

## 2022-06-07 MED ORDER — DM-GUAIFENESIN ER 30-600 MG PO TB12: 1.0000 | ORAL_TABLET | Freq: Two times a day (BID) | ORAL | 1 refills | Status: DC

## 2022-06-07 MED ORDER — POTASSIUM CHLORIDE CRYS ER 20 MEQ PO TBCR
40.0000 meq | EXTENDED_RELEASE_TABLET | Freq: Once | ORAL | Status: AC
  Administered 2022-06-07: 40 meq via ORAL
  Filled 2022-06-07: qty 2

## 2022-06-07 NOTE — Progress Notes (Signed)
Explained discharge paperwork and medications to be picked up from pharmacy. Patient understood instructions and did not have any questions. Walked patient to discharge area.

## 2022-06-09 LAB — CULTURE, BLOOD (ROUTINE X 2)
Culture: NO GROWTH
Culture: NO GROWTH

## 2022-06-09 LAB — CYTOLOGY - NON PAP

## 2022-06-09 NOTE — Discharge Summary (Signed)
Physician Discharge Summary   Patient: David Callahan MRN: [id number] DOB: 1956-08-26  Admit date:     06/04/2022  Discharge date: 06/07/2022  Discharge Physician: Kathlen Mody   PCP: Patient, No Pcp Per   Recommendations at discharge:  Please follow up with PCP in one week.  Please follow up with a repeat CXR in 4 weeks.   Discharge Diagnoses: Principal Problem:   CAP (community acquired pneumonia) Active Problems:   COPD (chronic obstructive pulmonary disease) (HCC)   Leukocytosis   Thrombocytosis   AKI (acute kidney injury) (HCC)   Hyperglycemia   Hyponatremia   Hypokalemia   Restless leg syndrome   Mixed hyperlipidemia    Hospital Course: David Callahan is a 66 y.o. male with medical history significant of COPD, hyperlipidemia, hypertension, RLS who presents to the emergency department after being sent from PCPs office.  Patient complains of 4-day onset of worsening shortness of breath which was associated with productive cough of clear phlegm.  He denies fever, chest pain, nausea or vomiting.  He went to [facility] due to cough and chest congestion.  COVID and flu test done was negative and patient was treated with steroids for possible acute exacerbation of COPD.  Chest x-ray done was concerning for infectious process/malignancy and was referred to ED for further evaluation.   CT chest without contrast showed moderate loculated left pleural effusion. Airspace disease in the left lower lobe concerning for pneumonia. Lingular opacity likely reflects atelectasis. Patient was treated with IV ceftriaxone and azithromycin, IV hydration was provided, potassium was replenished.  Hospitalist was asked to admit patient for further evaluation and management  Assessment and Plan: Community-acquired pneumonia present on admission with associated left-sided loculated pleural effusion. CT of the chest without contrast shows moderate loculated left pleural effusion. Patient  was started on IV Rocephin and Zithromax. Follow blood cultures, sputum cultures.  Urine Legionella negative. and strep pneumonia antigen  is negative.  Recommend follow up with PCP with a repeat CXR and follow the results of the thoracentesis.  IR consulted for  thoracentesis and 650 ml fluid taken out. Its exudative by Light's Criteria.  Lactic acid has normalized, mild improvement in leukocytosis. Leukocytosis is improving.        Acute kidney injury Patient's baseline creatinine around 1.2 admitted with a creatinine of 2.05 After adequate hydration creatinine has improved to 1.7 to 1.3.     Normocytic anemia Continue to follow.     Thrombocytosis  probably secondary to infection.       Hypokalemia Replaced.      COPD No wheezing heard on exam continue with bronchodilators as needed. No wheezing or rhonchi.    Essential hypertension  blood pressure parameters appear to be optimal  continue with Cardizem and benazepril.     Restless leg syndrome On Requip .    Hyperlipidemia Continue with Lipitor.  Mildly elevated liver enzymes  Recheck liver enzymes in one week.      Consultants: none.  Procedures performed: thoracentesis.   Disposition: Home Diet recommendation:  Discharge Diet Orders (From admission, onward)     Start     Ordered   06/07/22 0000  Diet - low sodium heart healthy        06/07/22 0909           Regular diet DISCHARGE MEDICATION: Allergies as of 06/07/2022       Reactions   Motrin [ibuprofen] Swelling   Anti-inflammatory analgesics- cause anaphylaxis  Medication List     TAKE these medications    albuterol 108 (90 Base) MCG/ACT inhaler Commonly known as: VENTOLIN HFA Inhale 2 puffs into the lungs every 6 (six) hours as needed for shortness of breath.   amoxicillin-clavulanate 875-125 MG tablet Commonly known as: AUGMENTIN Take 1 tablet by mouth 2 (two) times daily for 5 days.   atorvastatin 40 MG  tablet Commonly known as: LIPITOR Take 40 mg by mouth daily.   benazepril 40 MG tablet Commonly known as: LOTENSIN Take 40 mg by mouth daily.   budesonide-formoterol 160-4.5 MCG/ACT inhaler Commonly known as: SYMBICORT Inhale 2 puffs into the lungs daily as needed (short of breath).   Combivent Respimat 20-100 MCG/ACT Aers respimat Generic drug: Ipratropium-Albuterol Inhale 1 puff into the lungs every 6 (six) hours as needed for wheezing.   cyclobenzaprine 10 MG tablet Commonly known as: FLEXERIL Take 10 mg by mouth at bedtime.   dextromethorphan-guaiFENesin 30-600 MG 12hr tablet Commonly known as: MUCINEX DM Take 1 tablet by mouth 2 (two) times daily.   diltiazem 300 MG 24 hr capsule Commonly known as: CARDIZEM CD Take 300 mg by mouth daily.   hydrochlorothiazide 25 MG tablet Commonly known as: HYDRODIURIL Take 25 mg by mouth daily.   HYDROcodone-acetaminophen 5-325 MG tablet Commonly known as: NORCO/VICODIN Take 2 tablets by mouth every 6 (six) hours as needed for moderate pain or severe pain.   rOPINIRole 0.25 MG tablet Commonly known as: REQUIP Take 0.75 mg by mouth at bedtime.   traZODone 50 MG tablet Commonly known as: DESYREL Take 50 mg by mouth at bedtime.   Trelegy Ellipta 200-62.5-25 MCG/ACT Aepb Generic drug: Fluticasone-Umeclidin-Vilant Inhale 1 puff into the lungs daily.        Discharge Exam: Filed Weights   06/04/22 1831  Weight: 86.2 kg   General exam: Appears calm and comfortable  Respiratory system: Clear to auscultation. Respiratory effort normal. Cardiovascular system: S1 & S2 heard, RRR. No JVD, murmurs, rubs, gallops or clicks. No pedal edema. Gastrointestinal system: Abdomen is nondistended, soft and nontender. No organomegaly or masses felt. Normal bowel sounds heard. Central nervous system: Alert and oriented. No focal neurological deficits. Extremities: Symmetric 5 x 5 power. Skin: No rashes, lesions or ulcers Psychiatry:  Judgement and insight appear normal. Mood & affect appropriate.    Condition at discharge: fair  The results of significant diagnostics from this hospitalization (including imaging, microbiology, ancillary and laboratory) are listed below for reference.   Imaging Studies: DG Chest 1 View  Result Date: 06/05/2022 CLINICAL DATA:  Partially loculated LEFT pleural effusion post thoracentesis EXAM: CHEST  1 VIEW COMPARISON:  12/10/2021 FINDINGS: Normal heart size, mediastinal contours, and pulmonary vascularity. Atherosclerotic calcification aorta. Small to moderate LEFT pleural effusion and basilar atelectasis. No pneumothorax following thoracentesis. RIGHT lung clear. Advanced RIGHT glenohumeral degenerative changes. IMPRESSION: No pneumothorax following LEFT thoracentesis. Small to moderate LEFT pleural effusion and basilar atelectasis. Aortic Atherosclerosis (ICD10-I70.0). Electronically Signed   By: Ulyses Southward M.D.   On: 06/05/2022 09:59   US THORACENTESIS ASP PLEURAL SPACE W/IMG GUIDE  Result Date: 06/05/2022 INDICATION: Left pleural effusion EXAM: ULTRASOUND GUIDED LEFT THORACENTESIS MEDICATIONS: 9 cc 1% lidocaine. COMPLICATIONS: None immediate. PROCEDURE: An ultrasound guided thoracentesis was thoroughly discussed with the patient and questions answered. The benefits, risks, alternatives and complications were also discussed. The patient understands and wishes to proceed with the procedure. Written consent was obtained. Ultrasound was performed to localize and mark an adequate pocket of fluid in the Left chest.  The area was then prepped and draped in the normal sterile fashion. 1% Lidocaine was used for local anesthesia. Under ultrasound guidance a Yueh catheter was introduced. Thoracentesis was performed. The catheter was removed and a dressing applied. FINDINGS: A total of approximately 650 cc of yellow fluid was removed. Samples were sent to the laboratory as requested by the clinical team.  IMPRESSION: Successful ultrasound guided left thoracentesis yielding 650 cc of pleural fluid. Post CXR: No PTX per Dr Tyron Russell Read by Robet Leu [facility] Electronically Signed   By: Ulyses Southward M.D.   On: 06/05/2022 09:52   CT Chest Wo Contrast  Result Date: 06/04/2022 CLINICAL DATA:  Pneumonia, complication suspected, xray done EXAM: CT CHEST WITHOUT CONTRAST TECHNIQUE: Multidetector CT imaging of the chest was performed following the standard protocol without IV contrast. RADIATION DOSE REDUCTION: This exam was performed according to the departmental dose-optimization program which includes automated exposure control, adjustment of the mA and/or kV according to patient size and/or use of iterative reconstruction technique. COMPARISON:  12/10/2021. FINDINGS: Cardiovascular: Moderate coronary artery and aortic calcifications. Heart is normal size. Aorta is normal caliber. Mediastinum/Nodes: No mediastinal, hilar, or axillary adenopathy. Trachea and esophagus are unremarkable. Thyroid unremarkable. Lungs/Pleura: Moderate loculated left pleural effusion. Airspace disease in the left lower lobe and to a lesser extent lingula which could reflect atelectasis or pneumonia. Right lung clear. Upper Abdomen: Calcifications in the liver and spleen compatible with old granulomatous disease. Musculoskeletal: Chest wall soft tissues are unremarkable. No acute bony abnormality. IMPRESSION: Moderate loculated left pleural effusion. Airspace disease in the left lower lobe concerning for pneumonia. Lingular opacity likely reflects atelectasis. Old granulomatous disease. Coronary artery disease. Aortic Atherosclerosis (ICD10-I70.0). Electronically Signed   By: Charlett Nose M.D.   On: 06/04/2022 21:54    Microbiology: Results for orders placed or performed during the hospital encounter of 06/04/22  Blood culture (routine x 2)     Status: None (Preliminary result)   Collection Time: 06/04/22  8:15 PM   Specimen: BLOOD  Result  Value Ref Range Status   Specimen Description BLOOD RIGHT ANTECUBITAL  Final   Special Requests   Final    BOTTLES DRAWN AEROBIC AND ANAEROBIC Blood Culture results may not be optimal due to an excessive volume of blood received in culture bottles   Culture   Final    NO GROWTH 4 DAYS Performed at [facility], [address], [place], [place] [address]    Report Status PENDING  Incomplete  Blood culture (routine x 2)     Status: None (Preliminary result)   Collection Time: 06/04/22  8:32 PM   Specimen: BLOOD  Result Value Ref Range Status   Specimen Description BLOOD RIGHT ANTECUBITAL  Final   Special Requests   Final    BOTTLES DRAWN AEROBIC AND ANAEROBIC Blood Culture results may not be optimal due to an excessive volume of blood received in culture bottles   Culture   Final    NO GROWTH 4 DAYS Performed at [facility], [address], [place], [place] [address]    Report Status PENDING  Incomplete  Gram stain     Status: None   Collection Time: 06/05/22  9:20 AM   Specimen: Pleura  Result Value Ref Range Status   Specimen Description PLEURAL LEFT  Final   Special Requests NONE  Final   Gram Stain   Final    NO ORGANISMS SEEN WBC PRESENT,BOTH PMN AND MONONUCLEAR CYTOSPIN SMEAR Performed at [facility],  [address], [place], [place] [address]    Report Status 06/05/2022 FINAL  Final  Culture, body fluid w Gram Stain-bottle     Status: None (Preliminary result)   Collection Time: 06/05/22  9:20 AM   Specimen: Pleura  Result Value Ref Range Status   Specimen Description PLEURAL LEFT  Final   Special Requests BOTTLES DRAWN AEROBIC AND ANAEROBIC 10CC  Final   Culture   Final    NO GROWTH 3 DAYS Performed at [facility], [address] [place], [place] [address]    Report Status PENDING  Incomplete    Labs: CBC: Recent Labs  Lab 06/04/22 1915 06/05/22 0503 06/06/22 1200 06/07/22 0419  WBC 29.2* 28.0* 27.6* 23.4*  NEUTROABS 28.3*  --  23.1* 17.9*  HGB 11.6* 10.5*  9.9* 10.0*  HCT 35.1* 32.0* 30.6* 31.6*  MCV 85.8 87.4 88.4 89.8  PLT 622* 538* 554* 591*   Basic Metabolic Panel: Recent Labs  Lab 06/04/22 1915 06/05/22 0503 06/06/22 1200 06/07/22 0419  NA 134* 134* 139 137  K 2.7* 3.7 3.1* 3.4*  CL 93* 98 100 102  CO2 28 27 28 27   GLUCOSE 232* 150* 180* 102*  BUN 38* 35* 30* 27*  CREATININE 2.05* 1.72* 1.31* 1.27*  CALCIUM 8.6* 8.5* 8.4* 8.3*   Liver Function Tests: Recent Labs  Lab 06/05/22 0503 06/06/22 1200  AST 35 149*  ALT 24 73*  ALKPHOS 83 79  BILITOT 0.8 0.6  PROT 6.6 6.2*  ALBUMIN 2.2* 2.0*   CBG: No results for input(s): "GLUCAP" in the last 168 hours.  Discharge time spent: 40  minutes.   Signed: 06/08/22, MD [facility] 06/09/2022

## 2022-06-10 LAB — CULTURE, BODY FLUID W GRAM STAIN -BOTTLE: Culture: NO GROWTH

## 2022-06-11 LAB — MISC LABCORP TEST (SEND OUT): Labcorp test code: 9985

## 2022-06-14 ENCOUNTER — Other Ambulatory Visit: Payer: Self-pay

## 2022-06-14 ENCOUNTER — Encounter (HOSPITAL_COMMUNITY): Payer: Self-pay

## 2022-06-14 ENCOUNTER — Inpatient Hospital Stay (HOSPITAL_COMMUNITY)
Admission: EM | Admit: 2022-06-14 | Discharge: 2022-06-23 | DRG: 177 | Disposition: A | Discharge status: Home / Self Care | Payer: Medicare PPO | Attending: Family Medicine | Admitting: Family Medicine

## 2022-06-14 ENCOUNTER — Emergency Department (HOSPITAL_COMMUNITY): Payer: Medicare PPO

## 2022-06-14 DIAGNOSIS — Z8249 Family history of ischemic heart disease and other diseases of the circulatory system: Secondary | ICD-10-CM

## 2022-06-14 DIAGNOSIS — D509 Iron deficiency anemia, unspecified: Secondary | ICD-10-CM | POA: Diagnosis present

## 2022-06-14 DIAGNOSIS — A419 Sepsis, unspecified organism: Principal | ICD-10-CM

## 2022-06-14 DIAGNOSIS — J918 Pleural effusion in other conditions classified elsewhere: Secondary | ICD-10-CM | POA: Diagnosis present

## 2022-06-14 DIAGNOSIS — J869 Pyothorax without fistula: Secondary | ICD-10-CM | POA: Diagnosis present

## 2022-06-14 DIAGNOSIS — F17210 Nicotine dependence, cigarettes, uncomplicated: Secondary | ICD-10-CM | POA: Diagnosis present

## 2022-06-14 DIAGNOSIS — E860 Dehydration: Secondary | ICD-10-CM | POA: Diagnosis present

## 2022-06-14 DIAGNOSIS — J189 Pneumonia, unspecified organism: Secondary | ICD-10-CM | POA: Diagnosis present

## 2022-06-14 DIAGNOSIS — Z7951 Long term (current) use of inhaled steroids: Secondary | ICD-10-CM | POA: Diagnosis not present

## 2022-06-14 DIAGNOSIS — I959 Hypotension, unspecified: Secondary | ICD-10-CM | POA: Diagnosis present

## 2022-06-14 DIAGNOSIS — Z8701 Personal history of pneumonia (recurrent): Secondary | ICD-10-CM

## 2022-06-14 DIAGNOSIS — D638 Anemia in other chronic diseases classified elsewhere: Secondary | ICD-10-CM | POA: Diagnosis present

## 2022-06-14 DIAGNOSIS — E872 Acidosis, unspecified: Secondary | ICD-10-CM | POA: Diagnosis present

## 2022-06-14 DIAGNOSIS — E785 Hyperlipidemia, unspecified: Secondary | ICD-10-CM | POA: Diagnosis present

## 2022-06-14 DIAGNOSIS — D72825 Bandemia: Secondary | ICD-10-CM | POA: Diagnosis not present

## 2022-06-14 DIAGNOSIS — N179 Acute kidney failure, unspecified: Secondary | ICD-10-CM | POA: Diagnosis present

## 2022-06-14 DIAGNOSIS — J15212 Pneumonia due to Methicillin resistant Staphylococcus aureus: Principal | ICD-10-CM | POA: Diagnosis present

## 2022-06-14 DIAGNOSIS — Z716 Tobacco abuse counseling: Secondary | ICD-10-CM | POA: Diagnosis not present

## 2022-06-14 DIAGNOSIS — Z886 Allergy status to analgesic agent status: Secondary | ICD-10-CM

## 2022-06-14 DIAGNOSIS — J449 Chronic obstructive pulmonary disease, unspecified: Secondary | ICD-10-CM | POA: Diagnosis not present

## 2022-06-14 DIAGNOSIS — J440 Chronic obstructive pulmonary disease with acute lower respiratory infection: Secondary | ICD-10-CM | POA: Diagnosis present

## 2022-06-14 DIAGNOSIS — F17200 Nicotine dependence, unspecified, uncomplicated: Secondary | ICD-10-CM | POA: Diagnosis not present

## 2022-06-14 DIAGNOSIS — D75839 Thrombocytosis, unspecified: Secondary | ICD-10-CM | POA: Diagnosis present

## 2022-06-14 DIAGNOSIS — G2581 Restless legs syndrome: Secondary | ICD-10-CM | POA: Diagnosis present

## 2022-06-14 DIAGNOSIS — J90 Pleural effusion, not elsewhere classified: Secondary | ICD-10-CM | POA: Diagnosis present

## 2022-06-14 DIAGNOSIS — Z79899 Other long term (current) drug therapy: Secondary | ICD-10-CM | POA: Diagnosis not present

## 2022-06-14 DIAGNOSIS — Z87892 Personal history of anaphylaxis: Secondary | ICD-10-CM | POA: Diagnosis not present

## 2022-06-14 DIAGNOSIS — Z720 Tobacco use: Secondary | ICD-10-CM | POA: Diagnosis not present

## 2022-06-14 DIAGNOSIS — Z20822 Contact with and (suspected) exposure to covid-19: Secondary | ICD-10-CM | POA: Diagnosis present

## 2022-06-14 DIAGNOSIS — D72829 Elevated white blood cell count, unspecified: Secondary | ICD-10-CM | POA: Diagnosis not present

## 2022-06-14 DIAGNOSIS — I10 Essential (primary) hypertension: Secondary | ICD-10-CM | POA: Diagnosis present

## 2022-06-14 DIAGNOSIS — B9562 Methicillin resistant Staphylococcus aureus infection as the cause of diseases classified elsewhere: Secondary | ICD-10-CM | POA: Diagnosis present

## 2022-06-14 LAB — CBC WITH DIFFERENTIAL/PLATELET
Abs Immature Granulocytes: 0.39 10*3/uL — ABNORMAL HIGH (ref 0.00–0.07)
Basophils Absolute: 0.1 10*3/uL (ref 0.0–0.1)
Basophils Relative: 0 %
Eosinophils Absolute: 0 10*3/uL (ref 0.0–0.5)
Eosinophils Relative: 0 %
HCT: 29.6 % — ABNORMAL LOW (ref 39.0–52.0)
Hemoglobin: 9.5 g/dL — ABNORMAL LOW (ref 13.0–17.0)
Immature Granulocytes: 2 %
Lymphocytes Relative: 8 %
Lymphs Abs: 2 10*3/uL (ref 0.7–4.0)
MCH: 28.7 pg (ref 26.0–34.0)
MCHC: 32.1 g/dL (ref 30.0–36.0)
MCV: 89.4 fL (ref 80.0–100.0)
Monocytes Absolute: 2.2 10*3/uL — ABNORMAL HIGH (ref 0.1–1.0)
Monocytes Relative: 9 %
Neutro Abs: 20.1 10*3/uL — ABNORMAL HIGH (ref 1.7–7.7)
Neutrophils Relative %: 81 %
Platelets: 670 10*3/uL — ABNORMAL HIGH (ref 150–400)
RBC: 3.31 MIL/uL — ABNORMAL LOW (ref 4.22–5.81)
RDW: 18.4 % — ABNORMAL HIGH (ref 11.5–15.5)
WBC: 24.8 10*3/uL — ABNORMAL HIGH (ref 4.0–10.5)
nRBC: 0 % (ref 0.0–0.2)

## 2022-06-14 LAB — URINALYSIS, ROUTINE W REFLEX MICROSCOPIC
Bacteria, UA: NONE SEEN
Bilirubin Urine: NEGATIVE
Glucose, UA: NEGATIVE mg/dL
Ketones, ur: NEGATIVE mg/dL
Leukocytes,Ua: NEGATIVE
Nitrite: NEGATIVE
Protein, ur: NEGATIVE mg/dL
Specific Gravity, Urine: 1.01 (ref 1.005–1.030)
pH: 6 (ref 5.0–8.0)

## 2022-06-14 LAB — STREP PNEUMONIAE URINARY ANTIGEN: Strep Pneumo Urinary Antigen: NEGATIVE

## 2022-06-14 LAB — COMPREHENSIVE METABOLIC PANEL
ALT: 28 U/L (ref 0–44)
AST: 25 U/L (ref 15–41)
Albumin: 2.2 g/dL — ABNORMAL LOW (ref 3.5–5.0)
Alkaline Phosphatase: 85 U/L (ref 38–126)
Anion gap: 12 (ref 5–15)
BUN: 30 mg/dL — ABNORMAL HIGH (ref 8–23)
CO2: 25 mmol/L (ref 22–32)
Calcium: 8.2 mg/dL — ABNORMAL LOW (ref 8.9–10.3)
Chloride: 99 mmol/L (ref 98–111)
Creatinine, Ser: 2.4 mg/dL — ABNORMAL HIGH (ref 0.61–1.24)
GFR, Estimated: 29 mL/min — ABNORMAL LOW (ref >60)
Glucose, Bld: 118 mg/dL — ABNORMAL HIGH (ref 70–99)
Potassium: 3.6 mmol/L (ref 3.5–5.1)
Sodium: 136 mmol/L (ref 135–145)
Total Bilirubin: 1 mg/dL (ref 0.3–1.2)
Total Protein: 6.5 g/dL (ref 6.5–8.1)

## 2022-06-14 LAB — LIPASE, BLOOD: Lipase: 22 U/L (ref 11–51)

## 2022-06-14 LAB — LACTIC ACID, PLASMA
Lactic Acid, Venous: 2.5 mmol/L (ref 0.5–1.9)
Lactic Acid, Venous: 2 mmol/L (ref 0.5–1.9)
Lactic Acid, Venous: 1.4 mmol/L (ref 0.5–1.9)

## 2022-06-14 LAB — SARS CORONAVIRUS 2 BY RT PCR: SARS Coronavirus 2 by RT PCR: NEGATIVE

## 2022-06-14 LAB — CBG MONITORING, ED: Glucose-Capillary: 131 mg/dL — ABNORMAL HIGH (ref 70–99)

## 2022-06-14 LAB — PROCALCITONIN: Procalcitonin: 0.48 ng/mL

## 2022-06-14 LAB — MRSA NEXT GEN BY PCR, NASAL: MRSA by PCR Next Gen: DETECTED — AB

## 2022-06-14 LAB — IRON AND TIBC
Iron: 12 ug/dL — ABNORMAL LOW (ref 45–182)
Saturation Ratios: 7 % — ABNORMAL LOW (ref 17.9–39.5)
TIBC: 164 ug/dL — ABNORMAL LOW (ref 250–450)
UIBC: 152 ug/dL

## 2022-06-14 LAB — TROPONIN I (HIGH SENSITIVITY): Troponin I (High Sensitivity): 8 ng/L (ref <18)

## 2022-06-14 MED ORDER — SODIUM CHLORIDE 0.9% FLUSH: 3.0000 mL | INTRAVENOUS | Status: DC | PRN

## 2022-06-14 MED ORDER — TRAZODONE HCL 50 MG PO TABS
50.0000 mg | ORAL_TABLET | Freq: Every day | ORAL | Status: DC
  Administered 2022-06-14 – 2022-06-22 (×8): 50 mg via ORAL
  Filled 2022-06-14 (×9): qty 1

## 2022-06-14 MED ORDER — HEPARIN SODIUM (PORCINE) 5000 UNIT/ML IJ SOLN: 5000.0000 [IU] | Freq: Three times a day (TID) | INTRAMUSCULAR | Status: DC

## 2022-06-14 MED ORDER — ACETAMINOPHEN 325 MG PO TABS: 650.0000 mg | ORAL_TABLET | Freq: Four times a day (QID) | ORAL | Status: DC | PRN

## 2022-06-14 MED ORDER — SODIUM CHLORIDE 0.9% FLUSH
3.0000 mL | Freq: Two times a day (BID) | INTRAVENOUS | Status: DC
  Administered 2022-06-14 – 2022-06-15 (×2): 3 mL via INTRAVENOUS

## 2022-06-14 MED ORDER — SODIUM CHLORIDE 0.9% FLUSH: 3.0000 mL | Freq: Two times a day (BID) | INTRAVENOUS | Status: DC

## 2022-06-14 MED ORDER — DM-GUAIFENESIN ER 30-600 MG PO TB12
1.0000 | ORAL_TABLET | Freq: Two times a day (BID) | ORAL | Status: DC
  Administered 2022-06-14 – 2022-06-23 (×18): 1 via ORAL
  Filled 2022-06-14 (×19): qty 1

## 2022-06-14 MED ORDER — SODIUM CHLORIDE 0.9 % IV BOLUS
1000.0000 mL | Freq: Once | INTRAVENOUS | Status: AC
  Administered 2022-06-14: 1000 mL via INTRAVENOUS

## 2022-06-14 MED ORDER — MUPIROCIN 2 % EX OINT
1.0000 | TOPICAL_OINTMENT | Freq: Two times a day (BID) | CUTANEOUS | Status: AC
  Administered 2022-06-15 – 2022-06-19 (×10): 1 via NASAL
  Filled 2022-06-14 (×2): qty 22

## 2022-06-14 MED ORDER — ADULT MULTIVITAMIN W/MINERALS CH
1.0000 | ORAL_TABLET | Freq: Every day | ORAL | Status: DC
  Administered 2022-06-14 – 2022-06-23 (×10): 1 via ORAL
  Filled 2022-06-14 (×10): qty 1

## 2022-06-14 MED ORDER — LACTATED RINGERS IV BOLUS (SEPSIS)
1000.0000 mL | Freq: Once | INTRAVENOUS | Status: AC
Start: 2022-06-14 — End: 2022-06-14
  Administered 2022-06-14: 1000 mL via INTRAVENOUS

## 2022-06-14 MED ORDER — HYDROCODONE-ACETAMINOPHEN 5-325 MG PO TABS
2.0000 | ORAL_TABLET | Freq: Four times a day (QID) | ORAL | Status: DC | PRN
  Administered 2022-06-15 – 2022-06-23 (×20): 2 via ORAL
  Filled 2022-06-14 (×20): qty 2

## 2022-06-14 MED ORDER — CHLORHEXIDINE GLUCONATE CLOTH 2 % EX PADS
6.0000 | MEDICATED_PAD | Freq: Every day | CUTANEOUS | Status: AC
  Administered 2022-06-15 – 2022-06-19 (×5): 6 via TOPICAL

## 2022-06-14 MED ORDER — LACTATED RINGERS IV SOLN: INTRAVENOUS | Status: DC

## 2022-06-14 MED ORDER — HEPARIN SODIUM (PORCINE) 5000 UNIT/ML IJ SOLN
5000.0000 [IU] | Freq: Three times a day (TID) | INTRAMUSCULAR | Status: DC
  Administered 2022-06-14 – 2022-06-23 (×24): 5000 [IU] via SUBCUTANEOUS
  Filled 2022-06-14 (×23): qty 1

## 2022-06-14 MED ORDER — DEXTROSE IN LACTATED RINGERS 5 % IV SOLN: INTRAVENOUS | Status: DC

## 2022-06-14 MED ORDER — NICOTINE 21 MG/24HR TD PT24
21.0000 mg | MEDICATED_PATCH | Freq: Every day | TRANSDERMAL | Status: DC
  Administered 2022-06-14 – 2022-06-23 (×10): 21 mg via TRANSDERMAL
  Filled 2022-06-14 (×10): qty 1

## 2022-06-14 MED ORDER — SODIUM CHLORIDE 0.9 % IV SOLN
2.0000 g | Freq: Once | INTRAVENOUS | Status: AC
  Administered 2022-06-14: 2 g via INTRAVENOUS
  Filled 2022-06-14: qty 12.5

## 2022-06-14 MED ORDER — ACETAMINOPHEN 650 MG RE SUPP: 650.0000 mg | Freq: Four times a day (QID) | RECTAL | Status: DC | PRN

## 2022-06-14 MED ORDER — SODIUM CHLORIDE 0.9 % IV SOLN: INTRAVENOUS | Status: DC

## 2022-06-14 MED ORDER — TRAZODONE HCL 50 MG PO TABS
50.0000 mg | ORAL_TABLET | Freq: Every evening | ORAL | Status: DC | PRN
  Administered 2022-06-16: 50 mg via ORAL

## 2022-06-14 MED ORDER — FLUTICASONE FUROATE-VILANTEROL 200-25 MCG/ACT IN AEPB
1.0000 | INHALATION_SPRAY | Freq: Every day | RESPIRATORY_TRACT | Status: DC
  Administered 2022-06-15 – 2022-06-23 (×7): 1 via RESPIRATORY_TRACT
  Filled 2022-06-14: qty 28

## 2022-06-14 MED ORDER — LACTATED RINGERS IV BOLUS (SEPSIS)
1000.0000 mL | Freq: Once | INTRAVENOUS | Status: AC
  Administered 2022-06-14: 1000 mL via INTRAVENOUS

## 2022-06-14 MED ORDER — VANCOMYCIN HCL IN DEXTROSE 1-5 GM/200ML-% IV SOLN
1000.0000 mg | INTRAVENOUS | Status: DC
  Administered 2022-06-15: 1000 mg via INTRAVENOUS
  Filled 2022-06-14 (×2): qty 200

## 2022-06-14 MED ORDER — ONDANSETRON HCL 4 MG/2ML IJ SOLN: 4.0000 mg | Freq: Four times a day (QID) | INTRAMUSCULAR | Status: DC | PRN

## 2022-06-14 MED ORDER — BISACODYL 10 MG RE SUPP: 10.0000 mg | Freq: Every day | RECTAL | Status: DC | PRN

## 2022-06-14 MED ORDER — ROPINIROLE HCL 0.5 MG PO TABS
0.7500 mg | ORAL_TABLET | Freq: Every day | ORAL | Status: DC
  Administered 2022-06-14 – 2022-06-22 (×9): 0.75 mg via ORAL
  Filled 2022-06-14 (×9): qty 2

## 2022-06-14 MED ORDER — UMECLIDINIUM BROMIDE 62.5 MCG/ACT IN AEPB
1.0000 | INHALATION_SPRAY | Freq: Every day | RESPIRATORY_TRACT | Status: DC
  Administered 2022-06-15 – 2022-06-23 (×8): 1 via RESPIRATORY_TRACT
  Filled 2022-06-14 (×2): qty 7

## 2022-06-14 MED ORDER — ALBUTEROL SULFATE (2.5 MG/3ML) 0.083% IN NEBU: 2.5000 mg | INHALATION_SOLUTION | RESPIRATORY_TRACT | Status: DC | PRN

## 2022-06-14 MED ORDER — SODIUM CHLORIDE 0.9 % IV SOLN: INTRAVENOUS | Status: DC | PRN

## 2022-06-14 MED ORDER — VANCOMYCIN HCL IN DEXTROSE 1-5 GM/200ML-% IV SOLN
1000.0000 mg | Freq: Once | INTRAVENOUS | Status: DC
  Filled 2022-06-14: qty 200

## 2022-06-14 MED ORDER — SODIUM CHLORIDE 0.9% FLUSH
3.0000 mL | Freq: Two times a day (BID) | INTRAVENOUS | Status: DC
  Administered 2022-06-14: 3 mL via INTRAVENOUS

## 2022-06-14 MED ORDER — VANCOMYCIN HCL 2000 MG/400ML IV SOLN
2000.0000 mg | Freq: Once | INTRAVENOUS | Status: AC
  Administered 2022-06-14: 2000 mg via INTRAVENOUS
  Filled 2022-06-14: qty 400

## 2022-06-14 MED ORDER — SODIUM CHLORIDE 0.9 % IV SOLN
2.0000 g | Freq: Two times a day (BID) | INTRAVENOUS | Status: DC
  Administered 2022-06-14 – 2022-06-18 (×8): 2 g via INTRAVENOUS
  Filled 2022-06-14 (×8): qty 12.5

## 2022-06-14 MED ORDER — SODIUM CHLORIDE 0.9% FLUSH
3.0000 mL | Freq: Two times a day (BID) | INTRAVENOUS | Status: DC
  Administered 2022-06-16 – 2022-06-22 (×12): 3 mL via INTRAVENOUS

## 2022-06-14 MED ORDER — CYCLOBENZAPRINE HCL 10 MG PO TABS
10.0000 mg | ORAL_TABLET | Freq: Every day | ORAL | Status: DC
  Administered 2022-06-14 – 2022-06-22 (×9): 10 mg via ORAL
  Filled 2022-06-14 (×9): qty 1

## 2022-06-14 MED ORDER — ONDANSETRON HCL 4 MG PO TABS: 4.0000 mg | ORAL_TABLET | Freq: Four times a day (QID) | ORAL | Status: DC | PRN

## 2022-06-14 MED ORDER — POLYETHYLENE GLYCOL 3350 17 G PO PACK: 17.0000 g | PACK | Freq: Every day | ORAL | Status: DC | PRN

## 2022-06-14 MED ORDER — MOMETASONE FURO-FORMOTEROL FUM 200-5 MCG/ACT IN AERO: 2.0000 | INHALATION_SPRAY | Freq: Two times a day (BID) | RESPIRATORY_TRACT | Status: DC

## 2022-06-14 MED ORDER — ATORVASTATIN CALCIUM 40 MG PO TABS
40.0000 mg | ORAL_TABLET | Freq: Every day | ORAL | Status: DC
  Administered 2022-06-14 – 2022-06-23 (×10): 40 mg via ORAL
  Filled 2022-06-14 (×10): qty 1

## 2022-06-14 NOTE — ED Triage Notes (Signed)
Patient reports onset of generalized weakness, dizziness (worse when standing) that started yesterday morning when he got to work at 0500. States that he feels like a "drunk person trying to walk" and can barely hold up his head. Drove to ED. Complaining of chronic back pain.

## 2022-06-14 NOTE — ED Notes (Signed)
Patient transported to CT 

## 2022-06-14 NOTE — ED Provider Notes (Signed)
[facility] EMERGENCY DEPARTMENT Provider Note   CSN: [id number] Arrival date & time: 06/14/22  0802     History  Chief Complaint  Patient presents with   Weakness   Dizziness    David Callahan is a 66 y.o. male.  Patient drove himself to the emergency department feeling lightheaded.  Upon arrival had blood pressures in the mid 60s systolic.  No fever.  Denied any pain or discomfort or any shortness of breath oxygen saturations were 95%.  Patient was admitted August 23 through August 26 for similar findings.  That we found today.  But he was not hypotensive at that time.  He had a loculated left pleural effusion had a thoracentesis done was concern for pneumonia concern for neoplasm but it came back as exudate.  Treated with antibiotics discharged on Augmentin.  Also had acute kidney injury at the time.  Patient back with similar complaints.  Despite the low blood pressure patient is remarkably well appearing.       Home Medications Prior to Admission medications   Medication Sig Start Date End Date Taking? Authorizing Provider  albuterol (VENTOLIN HFA) 108 (90 Base) MCG/ACT inhaler Inhale 2 puffs into the lungs every 6 (six) hours as needed for shortness of breath.   Yes [provider]  atorvastatin (LIPITOR) 40 MG tablet Take 40 mg by mouth daily.   Yes [provider]  benazepril (LOTENSIN) 40 MG tablet Take 40 mg by mouth daily. 09/18/20  Yes [provider]  budesonide-formoterol (SYMBICORT) 160-4.5 MCG/ACT inhaler Inhale 2 puffs into the lungs daily as needed (short of breath).   Yes [provider]  cyclobenzaprine (FLEXERIL) 10 MG tablet Take 10 mg by mouth at bedtime. 09/17/20  Yes [provider]  dextromethorphan-guaiFENesin (MUCINEX DM) 30-600 MG 12hr tablet Take 1 tablet by mouth 2 (two) times daily. 06/07/22  Yes Kathlen Mody, MD  diltiazem (CARDIZEM CD) 300 MG 24 hr capsule Take 300 mg by mouth daily. 09/18/20  Yes [provider]  HYDROcodone-acetaminophen (NORCO/VICODIN) 5-325 MG tablet Take 2 tablets by mouth every 6 (six) hours as needed for moderate pain or severe pain. 08/17/20  Yes [provider]  Ipratropium-Albuterol (COMBIVENT RESPIMAT) 20-100 MCG/ACT AERS respimat Inhale 1 puff into the lungs every 6 (six) hours as needed for wheezing. 10/08/20  Yes Shah, Pratik D, DO  levofloxacin (LEVAQUIN) 750 MG tablet Take 750 mg by mouth daily. Starting 08.23.23 x 7 days. 06/04/22  Yes [provider]  rOPINIRole (REQUIP) 0.25 MG tablet Take 0.75 mg by mouth at bedtime. 09/18/20  Yes [provider]  traZODone (DESYREL) 50 MG tablet Take 50 mg by mouth at bedtime. 09/26/20  Yes [provider]  Dwyane Luo 200-62.5-25 MCG/ACT AEPB Inhale 1 puff into the lungs daily. 01/03/22  Yes [provider]      Allergies    Motrin [ibuprofen]    Review of Systems   Review of Systems  Constitutional:  Negative for chills and fever.  HENT:  Negative for ear pain and sore throat.   Eyes:  Negative for pain and visual disturbance.  Respiratory:  Negative for cough and shortness of breath.   Cardiovascular:  Negative for chest pain and palpitations.  Gastrointestinal:  Negative for abdominal pain and vomiting.  Genitourinary:  Negative for dysuria and hematuria.  Musculoskeletal:  Negative for arthralgias and back pain.  Skin:  Negative for color change and rash.  Neurological:  Positive for light-headedness. Negative for seizures and  syncope.  All other systems reviewed and are negative.   Physical Exam Updated Vital Signs BP (!) 125/96   Pulse 62   Temp 97.8 F (36.6 C) (Oral)   Resp 20   Ht 1.778 m (5\' 10" )   Wt 86.2 kg   SpO2 98%   BMI 27.26 kg/m  Physical Exam Vitals and nursing note reviewed.  Constitutional:      General: He is not in acute distress.    Appearance: Normal appearance. He is well-developed.  HENT:     Head: Normocephalic and  atraumatic.  Eyes:     Extraocular Movements: Extraocular movements intact.     Conjunctiva/sclera: Conjunctivae normal.     Pupils: Pupils are equal, round, and reactive to light.  Cardiovascular:     Rate and Rhythm: Normal rate and regular rhythm.     Heart sounds: No murmur heard. Pulmonary:     Effort: Pulmonary effort is normal. No respiratory distress.     Breath sounds: Normal breath sounds.     Comments: Decreased breath sounds predominantly on the left.  No wheezing. Abdominal:     Palpations: Abdomen is soft.     Tenderness: There is no abdominal tenderness.  Musculoskeletal:        General: No swelling.     Cervical back: Normal range of motion and neck supple.  Skin:    General: Skin is warm and dry.     Capillary Refill: Capillary refill takes less than 2 seconds.  Neurological:     General: No focal deficit present.     Mental Status: He is alert and oriented to person, place, and time.     Cranial Nerves: No cranial nerve deficit.     Sensory: No sensory deficit.  Psychiatric:        Mood and Affect: Mood normal.     ED Results / Procedures / Treatments   Labs (all labs ordered are listed, but only abnormal results are displayed) Labs Reviewed  MRSA NEXT GEN BY PCR, NASAL - Abnormal; Notable for the following components:      Result Value   MRSA by PCR Next Gen DETECTED (*)    All other components within normal limits  URINALYSIS, ROUTINE W REFLEX MICROSCOPIC - Abnormal; Notable for the following components:   Hgb urine dipstick SMALL (*)    All other components within normal limits  CBC WITH DIFFERENTIAL/PLATELET - Abnormal; Notable for the following components:   WBC 24.8 (*)    RBC 3.31 (*)    Hemoglobin 9.5 (*)    HCT 29.6 (*)    RDW 18.4 (*)    Platelets 670 (*)    Neutro Abs 20.1 (*)    Monocytes Absolute 2.2 (*)    Abs Immature Granulocytes 0.39 (*)    All other components within normal limits  COMPREHENSIVE METABOLIC PANEL - Abnormal;  Notable for the following components:   Glucose, Bld 118 (*)    BUN 30 (*)    Creatinine, Ser 2.40 (*)    Calcium 8.2 (*)    Albumin 2.2 (*)    GFR, Estimated 29 (*)    All other components within normal limits  LACTIC ACID, PLASMA - Abnormal; Notable for the following components:   Lactic Acid, Venous 2.5 (*)    All other components within normal limits  LACTIC ACID, PLASMA - Abnormal; Notable for the following components:   Lactic Acid, Venous 2.0 (*)    All other components within normal limits  CBG MONITORING, ED - Abnormal; Notable for the following components:   Glucose-Capillary 131 (*)    All other components within normal limits  CULTURE, BLOOD (ROUTINE X 2)  CULTURE, BLOOD (ROUTINE X 2)  SARS CORONAVIRUS 2 BY RT PCR  LIPASE, BLOOD    EKG EKG Interpretation  Date/Time:  Saturday June 14 2022 08:20:13 EDT Ventricular Rate:  85 PR Interval:    QRS Duration: 88 QT Interval:  382 QTC Calculation: 455 R Axis:   61 Text Interpretation: Accelerated junctional rhythm Anteroseptal infarct, old Minimal ST depression, lateral leads No significant change since last tracing Confirmed by Vanetta Mulders [phone]) on 06/14/2022 8:45:00 AM  Radiology CT Chest Wo Contrast  Result Date: 06/14/2022 CLINICAL DATA:  Pneumonia. EXAM: CT CHEST WITHOUT CONTRAST TECHNIQUE: Multidetector CT imaging of the chest was performed following the standard protocol without IV contrast. RADIATION DOSE REDUCTION: This exam was performed according to the departmental dose-optimization program which includes automated exposure control, adjustment of the mA and/or kV according to patient size and/or use of iterative reconstruction technique. COMPARISON:  June 04, 2022. FINDINGS: Cardiovascular: Atherosclerosis of thoracic aorta is noted without aneurysm formation. Normal cardiac size. No pericardial effusion. Mild coronary artery calcifications are noted. Mediastinum/Nodes: Thyroid gland and esophagus are  unremarkable. Stable calcified adenopathy is noted most consistent with prior granulomatous disease. Lungs/Pleura: Right lung is clear. No definite pneumothorax is noted. Moderate size loculated left pleural effusion is again noted; there does appear to be the interval development of several foci of gas within this fluid collection which may represent either infection or attempted intervention. Mild associated atelectasis of the left upper and lower lobes is noted. Upper Abdomen: Calcified splenic granulomas are again noted. Musculoskeletal: No chest wall mass or suspicious bone lesions identified. IMPRESSION: Moderate size loculated left pleural effusion is again noted and not significantly changed in size. There does appear to be the interval development of several foci of gas within the fluid collection which may represent either infection or attempted iatrogenic intervention Mild coronary artery calcifications are noted. Aortic Atherosclerosis (ICD10-I70.0). Electronically Signed   By: Lupita Raider M.D.   On: 06/14/2022 09:59   DG Chest Port 1 View  Result Date: 06/14/2022 CLINICAL DATA:  Hypotension with generalized weakness. EXAM: PORTABLE CHEST 1 VIEW COMPARISON:  06/05/2022 FINDINGS: Right lung clear. Loculated left pleural effusion is progressive in the interval, now moderate to large in size with associated left base collapse/consolidation. Cardiopericardial silhouette is at upper limits of normal for size. The visualized bony structures of the thorax are unremarkable. Telemetry leads overlie the chest. IMPRESSION: Interval progression of loculated left pleural effusion, now moderate to large in size. Left base collapse/consolidation. Electronically Signed   By: Kennith Center M.D.   On: 06/14/2022 09:04    Procedures Procedures    Medications Ordered in ED Medications  0.9 %  sodium chloride infusion ( Intravenous Not Given 06/14/22 0947)  lactated ringers infusion ( Intravenous New Bag/Given  06/14/22 1251)  ceFEPIme (MAXIPIME) 2 g in sodium chloride 0.9 % 100 mL IVPB (has no administration in time range)  vancomycin (VANCOCIN) IVPB 1000 mg/200 mL premix (has no administration in time range)  sodium chloride 0.9 % bolus 1,000 mL (0 mLs Intravenous Stopped 06/14/22 0941)  lactated ringers bolus 1,000 mL (0 mLs Intravenous Stopped 06/14/22 1113)    And  lactated ringers bolus 1,000 mL (0 mLs Intravenous Stopped 06/14/22 1313)    And  lactated ringers bolus 1,000 mL (0 mLs Intravenous Stopped  06/14/22 1313)  ceFEPIme (MAXIPIME) 2 g in sodium chloride 0.9 % 100 mL IVPB (0 g Intravenous Stopped 06/14/22 1017)  vancomycin (VANCOREADY) IVPB 2000 mg/400 mL (0 mg Intravenous Stopped 06/14/22 1212)  sodium chloride 0.9 % bolus 1,000 mL (1,000 mLs Intravenous Bolus 06/14/22 1410)    ED Course/ Medical Decision Making/ A&P                           Medical Decision Making Amount and/or Complexity of Data Reviewed Labs: ordered. Radiology: ordered.  Risk Prescription drug management. Decision regarding hospitalization.   CRITICAL CARE Performed by: Vanetta Mulders Total critical care time: 60 minutes Critical care time was exclusive of separately billable procedures and treating other patients. Critical care was necessary to treat or prevent imminent or life-threatening deterioration. Critical care was time spent personally by me on the following activities: development of treatment plan with patient and/or surrogate as well as nursing, discussions with consultants, evaluation of patient's response to treatment, examination of patient, obtaining history from patient or surrogate, ordering and performing treatments and interventions, ordering and review of laboratory studies, ordering and review of radiographic studies, pulse oximetry and re-evaluation of patient's condition.  Patient initially treated with 1 L of fluid.  Blood pressure improved was not up to normal.  Patient's white count came back  very high.  Chest x-ray raised concerns about a worsening pleural effusion.  Lactic acid came back elevated at 2.5.  Based on this in the recent admission and the treatment for pneumonia sepsis order set was begun.  Patient therefore received 30 cc/kg of lactated Ringer's then went on to receive 1 more liter of fluid blood pressures are now stabilized out and mean arterial pressures are improved.  Patient did have a white count of 24,000.  Platelets were elevated at 670.  These are somewhat similar to what he was discharged from the hospital on August 26.  Complete metabolic panel significant for acute kidney injury with GFR now 29 he was discharged with GFR greater than 60 but he was admitted with acute kidney injury during the last admission.  Lipase was normal.  Blood sugars good CT chest just showed moderate size loculated left pleural effusion noted and then on CT not significantly changed in size but I thought it was bigger before there is several foci of gas within the fluid collection which may represent either infection or could be iatrogenic intervention from when the thoracentesis was done.  Patient's pressures did improve some and contacted hospitalist initially but then he dropped his pressures again so we decided that talk to critical care.  Spoke with critical care and since he was a little bit marginally recommend another liter of fluid pressures came up and now seem to be a little more stable.  Critical care said that they would consult from a pulmonary standpoint for the concerns of empyema or potential infection.  Patient is on the broad-spectrum antibiotics.  Plan was to admit to hospitalist service Lennette Bihari contacted hospitalist here patient will be admitted to hospital service [facility] with consultation by pulmonary medicine.   Final Clinical Impression(s) / ED Diagnoses Final diagnoses:  Sepsis, due to unspecified organism, unspecified whether acute organ dysfunction present (HCC)  AKI (acute  kidney injury) (HCC)  Pleural effusion    Rx / DC Orders ED Discharge Orders     None         Vanetta Mulders, MD 06/14/22 1549

## 2022-06-14 NOTE — ED Notes (Signed)
MD, Vanetta Mulders, made aware of pt's low BP

## 2022-06-14 NOTE — ED Notes (Addendum)
Lab called and stated pt MRSA was positive, pt placed on droplet/contact precautions, MD made aware

## 2022-06-14 NOTE — Progress Notes (Signed)
Elink following for sepsis protocol. 

## 2022-06-14 NOTE — ED Notes (Signed)
Urinal given to pt and informed of need for a urine sample

## 2022-06-14 NOTE — Consult Note (Signed)
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  Originally presented to [facility] admission on 06/04/2022 with 4 days of shortness of breath cough and clear sputum without any fever.  COVID and flu PCR were negative.  He was found to have lactic acidosis 2.3 and acute kidney injury 2 mg percent [baseline 1.1 mg percent] and CT chest with ordered a large loculated left pleural effusion on 06/04/2022 [personally visualized by PCCM MD].  The following day on 06/05/2022 underwent thoracentesis and drained 650 mL which was exudative LDH 610 [cytology negative, culture negative] he was treated with Antibiotics and then discharged home on 06/07/2022.  Chest x-ray postthoracentesis did show residual left pleural effusion.  He represented to [facility] emergency department 06/14/2022 with lightheadedness and hypotension that was corrected with fluids along with mild lactic acidosis [lactic greater than 2].  Chest x-ray showed persistence of the left loculated pleural effusion.  Pulmonary has been consulted.    MRSA PCR positivr  -this admission but denies having cats or any IV drug use of substance abuse.  He also denies any vomiting or aspiration or syncope.  Denies any sick contacts.  Rt thora 06/05/22 - LDH 612. 67% PMN wfor 1300 cells. Ciulture negative He is known to have chronic anemia  Past Medical History:    has a past medical history of Asthma and COPD (chronic obstructive pulmonary disease) (HCC).   reports that he has been smoking cigarettes. He has been smoking an average of .5 packs per day. He has never used smokeless tobacco.  Past Surgical History:   Procedure Laterality Date   KNEE SURGERY      Allergies  Allergen Reactions   Motrin [Ibuprofen] Swelling    Anti-inflammatory analgesics- cause anaphylaxis    There is no immunization history for the selected administration types on file for this patient.  History reviewed. No pertinent family history.   Current Facility-Administered Medications:    0.9 %  sodium chloride infusion, , Intravenous, Continuous, Emokpae, Courage, MD   0.9 %  sodium chloride infusion, , Intravenous, PRN, Mariea Clonts, Courage, MD   acetaminophen (TYLENOL) tablet 650 mg, 650 mg, Oral, Q6H PRN **OR** acetaminophen (TYLENOL) suppository 650 mg, 650 mg, Rectal, Q6H PRN, Emokpae, Courage, MD   albuterol (PROVENTIL) (2.5 MG/3ML) 0.083% nebulizer solution 2.5 mg, 2.5 mg, Nebulization, Q2H PRN, Emokpae, Courage, MD   atorvastatin (LIPITOR) tablet 40 mg, 40 mg, Oral, Daily, Emokpae, Courage, MD   bisacodyl (DULCOLAX) suppository 10 mg, 10 mg, Rectal, Daily PRN, Emokpae, Courage, MD   ceFEPIme (MAXIPIME) 2 g in sodium chloride 0.9 % 100 mL IVPB, 2 g, Intravenous, Q12H, Emokpae, Courage, MD   cyclobenzaprine (FLEXERIL) tablet 10 mg, 10 mg, Oral, QHS, Emokpae, Courage, MD   dextromethorphan-guaiFENesin (MUCINEX DM) 30-600 MG per 12 hr tablet 1 tablet, 1 tablet, Oral, BID, Emokpae, Courage, MD   [START ON 06/15/2022] fluticasone furoate-vilanterol (BREO ELLIPTA) 200-25 MCG/ACT 1 puff, 1 puff, Inhalation, Daily **AND** [START ON 06/15/2022] umeclidinium bromide (INCRUSE ELLIPTA) 62.5 MCG/ACT 1 puff, 1 puff, Inhalation, Daily, Emokpae, Courage, MD   heparin injection 5,000 Units, 5,000 Units, Subcutaneous, Q8H, Emokpae, Courage, MD   HYDROcodone-acetaminophen (  NORCO/VICODIN) 5-325 MG per tablet 2 tablet, 2 tablet, Oral, Q6H PRN, Emokpae, Courage, MD   multivitamin with minerals tablet 1 tablet, 1 tablet, Oral, Daily, Emokpae, Courage, MD   nicotine (NICODERM CQ - dosed in mg/24 hours) patch 21 mg, 21 mg, Transdermal, Daily,  Emokpae, Courage, MD   ondansetron (ZOFRAN) tablet 4 mg, 4 mg, Oral, Q6H PRN **OR** ondansetron (ZOFRAN) injection 4 mg, 4 mg, Intravenous, Q6H PRN, Emokpae, Courage, MD   polyethylene glycol (MIRALAX / GLYCOLAX) packet 17 g, 17 g, Oral, Daily PRN, Emokpae, Courage, MD   rOPINIRole (REQUIP) tablet 0.75 mg, 0.75 mg, Oral, QHS, Emokpae, Courage, MD   sodium chloride flush (NS) 0.9 % injection 3 mL, 3 mL, Intravenous, Q12H, Emokpae, Courage, MD   sodium chloride flush (NS) 0.9 % injection 3 mL, 3 mL, Intravenous, Q12H, Emokpae, Courage, MD   sodium chloride flush (NS) 0.9 % injection 3 mL, 3 mL, Intravenous, PRN, Emokpae, Courage, MD   traZODone (DESYREL) tablet 50 mg, 50 mg, Oral, QHS, Emokpae, Courage, MD   [START ON 06/15/2022] vancomycin (VANCOCIN) IVPB 1000 mg/200 mL premix, 1,000 mg, Intravenous, Q24H, Emokpae, Courage, MD      Significant Hospital Events:  06/14/2022 - admit   Interim History / Subjective:   06/14/2022 - seen in bed 6 [address]. 29 at [facility]  Objective   Blood pressure 122/63, pulse (!) 57, temperature 98.3 F (36.8 C), temperature source Oral, resp. rate 20, height 5\' 10"  (1.778 m), weight 86.2 kg, SpO2 100 %.       No intake or output data in the 24 hours ending 06/14/22 1954 Filed Weights   06/14/22 0811  Weight: 86.2 kg    Examination: General: Mildly barrel chested male.  Sitting in the bed.  Just finished dinner HENT: No elevated JVP no neck nodes. Lungs: Mildly barrel chested no wheeze.  Prolonged expiration.  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 -Present on Admit Ongoing smoking - Present on  Admit   06/14/2022 -> clinically not in exacerbation  P:   Continue home Incruse+ BReo Albuterol as needed Nicotine patch    Left loculated effusion ( empyema) - onset 06/04/22 - Present on Admit; clinically infectious MRSA PCR +ve - Present on Admit   P:   AGree with Vanc and Cefepime Check urine strep and urine leg Await RVP Needs Left Chest Tube Pig tail (IR consult ordered) with diagnostic thora -> then needs Intrapleural fibrinolytics    AKI - Present on Admit ;; creat 2.4 (baselnie 1.1)    P:  D5 LR fluids 75cc/h and monitor Rest per triad   Lactic acidosis - Present on Admit   06/14/2022 - 2.5 at admit -> 2.0  P Monitor with fluids     Anemia - Present on Admit ; chronic unchagned   P:  - check Iron panel - PRBC for hgb </= 6.9gm%    - exceptions are   -  if ACS susepcted/confirmed then transfuse for hgb </= 8.0gm%,  or    -  active bleeding with hemodynamic instability, then transfuse regardless of hemoglobin value   At at all times try to transfuse 1 unit prbc as possible with  exception of active hemorrhage    Thrombocytosis -Present on Admit, likeluyt due to empyema   P monitor    Best practice (daily eval):  Diet: per tirad Pain/Anxiety/Delirium protocol (if indicated): per triad VAP protocol (if indicated): x DVT prophylaxis: Heparin SQ per triad GI prophylaxis: per triad Glucose control: per triad Mobility: per triad Code Status: full Family Communication: bedside to patient Disposition: in floor   D.w Dr Theodoro Clock    Dr. Kalman Shan, M.D., F.C.C.P,  Pulmonary and Critical Care Medicine Staff Physician, [facility] Center Director - Interstitial Lung Disease  Program  Pulmonary Fibrosis [facility] [place], [place], [address]  NPI Number:  NPI #[id number]  Pager: [phone], If no answer  -> Check AMION or Try [phone] Telephone (clinical  office): [phone] Telephone (research): [phone]  7:54 PM 06/14/2022     LABS    PULMONARY No results for input(s): "PHART", "PCO2ART", "PO2ART", "HCO3", "TCO2", "O2SAT" in the last 168 hours.  Invalid input(s): "PCO2", "PO2"  CBC Recent Labs  Lab 06/14/22 0830  HGB 9.5*  HCT 29.6*  WBC 24.8*  PLT 670*    COAGULATION No results for input(s): "INR" in the last 168 hours.  CARDIAC  No results for input(s): "TROPONINI" in the last 168 hours. No results for input(s): "PROBNP" in the last 168 hours.  CHEMISTRY Recent Labs  Lab 06/14/22 0830  NA 136  K 3.6  CL 99  CO2 25  GLUCOSE 118*  BUN 30*  CREATININE 2.40*  CALCIUM 8.2*   Estimated Creatinine Clearance: 31.7 mL/min (A) (by C-G formula based on SCr of 2.4 mg/dL (H)).   LIVER Recent Labs  Lab 06/14/22 0830  AST 25  ALT 28  ALKPHOS 85  BILITOT 1.0  PROT 6.5  ALBUMIN 2.2*     INFECTIOUS Recent Labs  Lab 06/14/22 0850 06/14/22 1059  LATICACIDVEN 2.5* 2.0*     ENDOCRINE CBG (last 3)  Recent Labs    06/14/22 0817  GLUCAP 131*         IMAGING x48h  - image(s) personally visualized  -   highlighted in bold CT Chest Wo Contrast  Result Date: 06/14/2022 CLINICAL DATA:  Pneumonia. EXAM: CT CHEST WITHOUT CONTRAST TECHNIQUE: Multidetector CT imaging of the chest was performed following the standard protocol without IV contrast. RADIATION DOSE REDUCTION: This exam was performed according to the departmental dose-optimization program which includes automated exposure control, adjustment of the mA and/or kV according to patient size and/or use of iterative reconstruction technique. COMPARISON:  June 04, 2022. FINDINGS: Cardiovascular: Atherosclerosis of thoracic aorta is noted without aneurysm formation. Normal cardiac size. No pericardial effusion. Mild coronary artery calcifications are noted. Mediastinum/Nodes: Thyroid gland and esophagus are unremarkable. Stable calcified adenopathy is  noted most consistent with prior granulomatous disease. Lungs/Pleura: Right lung is clear. No definite pneumothorax is noted. Moderate size loculated left pleural effusion is again noted; there does appear to be the interval development of several foci of gas within this fluid collection which may represent either infection or attempted intervention. Mild associated atelectasis of the left upper and lower lobes is noted. Upper Abdomen: Calcified splenic granulomas are again noted. Musculoskeletal: No chest wall mass or suspicious bone lesions identified. IMPRESSION: Moderate size loculated left pleural effusion is again noted and not significantly changed in size. There does appear to be the interval development of several foci of gas within  the fluid collection which may represent either infection or attempted iatrogenic intervention Mild coronary artery calcifications are noted. Aortic Atherosclerosis (ICD10-I70.0). Electronically Signed   By: Lupita Raider M.D.   On: 06/14/2022 09:59   DG Chest Port 1 View  Result Date: 06/14/2022 CLINICAL DATA:  Hypotension with generalized weakness. EXAM: PORTABLE CHEST 1 VIEW COMPARISON:  06/05/2022 FINDINGS: Right lung clear. Loculated left pleural effusion is progressive in the interval, now moderate to large in size with associated left base collapse/consolidation. Cardiopericardial silhouette is at upper limits of normal for size. The visualized bony structures of the thorax are unremarkable. Telemetry leads overlie the chest. IMPRESSION: Interval progression of loculated left pleural effusion, now moderate to large in size. Left base collapse/consolidation. Electronically Signed   By: Kennith Center M.D.   On: 06/14/2022 09:04

## 2022-06-14 NOTE — Progress Notes (Signed)
Pharmacy Antibiotic Note  David Callahan is a 66 y.o. male admitted on 06/14/2022 with pneumonia.  Pharmacy has been consulted for vancomycin and cefepime dosing.  Plan: Vancomycin 2000 mg IV x 1 dose. Vancomycin 1000 mg IV every 24 hours. Cefepime 2000 mg IV every 12 hours. Monitor labs, c/s, and vanco level as indicated.  Height: 5\' 10"  (177.8 cm) Weight: 86.2 kg (190 lb) IBW/kg (Calculated) : 73  Temp (24hrs), Avg:98.1 F (36.7 C), Min:98.1 F (36.7 C), Max:98.1 F (36.7 C)  Recent Labs  Lab 06/14/22 0830 06/14/22 0850  WBC 24.8*  --   CREATININE 2.40*  --   LATICACIDVEN  --  2.5*    Estimated Creatinine Clearance: 31.7 mL/min (A) (by C-G formula based on SCr of 2.4 mg/dL (H)).    Allergies  Allergen Reactions   Motrin [Ibuprofen] Swelling    Anti-inflammatory analgesics- cause anaphylaxis    Antimicrobials this admission: Vanco 9/2 >> Cefepime 9/2 >>  Microbiology results: 9/2 BCx: pending  9/2 MRSA PCR: pending  Thank you for allowing pharmacy to be a part of this patient's care.  11/2, PharmD Clinical Pharmacist 06/14/2022 10:12 AM

## 2022-06-14 NOTE — ED Notes (Signed)
Both sets of cultures drawn before antibiotic administration  

## 2022-06-14 NOTE — ED Notes (Signed)
MD made aware by the charge RN of pt's low BP--MD will assess shortly

## 2022-06-14 NOTE — H&P (Addendum)
Patient Demographics:    David Callahan, is a 66 y.o. male  MRN: [id number]   DOB - September 27, 1956  Admit Date - 06/14/2022  Outpatient Primary MD for the patient is Patient, No Pcp Per   Assessment & Plan:   Assessment and Plan:  1)Recurrent exudative left-sided pleural effusion which is loculated---CAP.??? empyema thoracentesis fluid analysis from 06/05/2022 was consistent with an exudate, Gram stain and cultures were negative.  Cytology was negative --CT chest on 06/14/2022 showed moderate-sized loculated left pleural effusion with interval development of several foci of gas within the fluid collection --WBC 24.8 it was 23.4 at the time of discharge on 06/07/2022 --Lactic acid 2.5 repeat 2.0 after IV fluids -COVID-negative -MRSA nasal swab is positive --Discussed with on-call pulmonologist Dr. Chase Caller--- antibiotics initiated, cultures pending, patient will need IR to place a pigtail catheter with thoracentesis and then PCCM will proceed with lytics -Empiric treatment with vancomycin and cefepime pending further culture data from blood and pleural fluid analysis to be obtained at time of thoracentesis hopefully on 06/15/2022 -Strep pneumo and Legionella antigens requested,, sputum culture requested -QuantiFERON TB Gold requested -Respiratory viral panel pending -Patient was discharged on 06/09/2022 with Levaquin which he has been taking until today  2)COPD/tobacco abuse--- continue bronchodilators and nicotine patch -Hold off on systemic steroids  3)HTN-patient currently #hypotension in the setting of sepsis secondary #1 -Hold benazepril and Cardizem  4) sepsis secondary to #1 above--POA--patient met sepsis criteria on admission with tachypnea, leukocytosis and finding of respiratory infection as above #1 --Hypotension  improved with IV fluids -Lactic acidosis improved with IV fluids -Management as above #1  5)AKI----acute kidney injury--- in the setting of hypotension and dehydration compounded by benazepril use --Creatinine is up to 2.4 baseline around 1.3 -Hold benazepril -Should improve with improvement in hemodynamics and hydration , renally adjust medications, avoid nephrotoxic agents / dehydration  / hypotension  6)HLD--continue atorvastatin, watch LFTs as they were recently elevated but not elevated at this time  7)RLS--continue Requip  8) acute on chronic anemia-- -Hemoglobin is 9.5 baseline usually above 11,  -H&H may drop further due to hydration/IV fluids   9) thrombocytosis ---platelets elevated at 670, suspect this is reactive in the setting of acute infection   Disposition/Need for in-Hospital Stay- patient unable to be discharged at this time due to -recurrent exudative pleural effusion requiring pulmonology and IR consults and interventions -Sepsis with transient hypotension requiring IV fluids and IV antibiotics  Status is: Inpatient  Remains inpatient appropriate because:   Dispo: The patient is from: Home              Anticipated d/c is to: Home              Anticipated d/c date is: 3 days              Patient currently is not medically stable to d/c. Barriers: Not Clinically Stable-    With History of - Reviewed  by me  Past Medical History:  Diagnosis Date   Asthma    COPD (chronic obstructive pulmonary disease) ([place])       Past Surgical History:  Procedure Laterality Date   KNEE SURGERY        Chief Complaint  Patient presents with   Weakness   Dizziness      HPI:    David Callahan  is a 66 y.o. male 66 year old smoker with past medical history relevant for HTN, HLD, COPD, presents with increasing shortness of breath after being discharged on 06/07/2022. -Patient was recently admitted on 06/04/2022 and discharged on 06/07/2022 after treatment for pleural  effusion, each thoracentesis fluid was consistent with an exudate he was treated with antibiotics and IV fluids for presumed community-acquired pneumonia and AKI respectively and discharged home---- --Patient was discharged on 06/09/2022 with Levaquin which he has been taking until today -Patient returns to the ED on 06/14/2022 with shortness of breath, dizziness and hypotension - BP improved with IV fluids... No chest pains palpitations no pleuritic symptoms -CT chest on 06/14/2022 showed moderate-sized loculated left pleural effusion with interval development of several foci of gas within the fluid collection -WBC 24.8 it was 23.4 at the time of discharge on 06/07/2022 -Hemoglobin is 9.5 baseline usually above 11, platelets elevated at 670 -Creatinine is up to 2.4 baseline around 1.3, LFTs are not elevated -Lactic acid 2.5 repeat 2.0 after IV fluids -UA is not suggestive of UTI -COVID-negative -MRSA nasal swab is positive -Discussed with on-call pulmonologist Dr. Chase Caller--- antibiotics initiated, cultures pending, patient will need IR to place a pigtail catheter with thoracentesis and then PCCM will proceed with lytics   Review of systems:    In addition to the HPI above,   A full Review of  Systems was done, all other systems reviewed are negative except as noted above in HPI , .    Social History:  Reviewed by me    Social History   Tobacco Use   Smoking status: Some Days    Packs/day: 0.50    Types: Cigarettes   Smokeless tobacco: Never  Substance Use Topics   Alcohol use: Not Currently       Family History :  Reviewed by me  HTN   Home Medications:   Prior to Admission medications   Medication Sig Start Date End Date Taking? Authorizing Provider  albuterol (VENTOLIN HFA) 108 (90 Base) MCG/ACT inhaler Inhale 2 puffs into the lungs every 6 (six) hours as needed for shortness of breath.   Yes [provider]  atorvastatin (LIPITOR) 40 MG tablet Take 40 mg by  mouth daily.   Yes [provider]  benazepril (LOTENSIN) 40 MG tablet Take 40 mg by mouth daily. 09/18/20  Yes [provider]  budesonide-formoterol (SYMBICORT) 160-4.5 MCG/ACT inhaler Inhale 2 puffs into the lungs daily as needed (short of breath).   Yes [provider]  cyclobenzaprine (FLEXERIL) 10 MG tablet Take 10 mg by mouth at bedtime. 09/17/20  Yes [provider]  dextromethorphan-guaiFENesin (MUCINEX DM) 30-600 MG 12hr tablet Take 1 tablet by mouth 2 (two) times daily. 06/07/22  Yes Hosie Poisson, MD  diltiazem (CARDIZEM CD) 300 MG 24 hr capsule Take 300 mg by mouth daily. 09/18/20  Yes [provider]  HYDROcodone-acetaminophen (NORCO/VICODIN) 5-325 MG tablet Take 2 tablets by mouth every 6 (six) hours as needed for moderate pain or severe pain. 08/17/20  Yes [provider]  Ipratropium-Albuterol (COMBIVENT RESPIMAT) 20-100 MCG/ACT AERS respimat Inhale  1 puff into the lungs every 6 (six) hours as needed for wheezing. 10/08/20  Yes Shah, Pratik D, DO  levofloxacin (LEVAQUIN) 750 MG tablet Take 750 mg by mouth daily. Starting 08.23.23 x 7 days. 06/04/22  Yes [provider]  rOPINIRole (REQUIP) 0.25 MG tablet Take 0.75 mg by mouth at bedtime. 09/18/20  Yes [provider]  traZODone (DESYREL) 50 MG tablet Take 50 mg by mouth at bedtime. 09/26/20  Yes [provider]  Donnal Debar 200-62.5-25 MCG/ACT AEPB Inhale 1 puff into the lungs daily. 01/03/22  Yes [provider]     Allergies:     Allergies  Allergen Reactions   Motrin [Ibuprofen] Swelling    Anti-inflammatory analgesics- cause anaphylaxis     Physical Exam:   Vitals  Blood pressure 130/84, pulse (!) 57, temperature 98.3 F (36.8 C), temperature source Oral, resp. rate 19, height $RemoveBe'5\' 10"'CNelyfsRj$  (1.778 m), weight 86.2 kg, SpO2 100 %.  Physical Examination: General appearance - alert,  in no distress  Mental status - alert, oriented to person,  place, and time,  Eyes - sclera anicteric Neck - supple, no JVD elevation , Chest -diminished  on the left, no wheezing Heart - S1 and S2 normal, regular  Abdomen - soft, nontender, nondistended, +BS Neurological - screening mental status exam normal, neck supple without rigidity, cranial nerves II through XII intact, DTR's normal and symmetric Extremities - no pedal edema noted, intact peripheral pulses  Skin - warm, dry   Data Review:    CBC Recent Labs  Lab 06/14/22 0830  WBC 24.8*  HGB 9.5*  HCT 29.6*  PLT 670*  MCV 89.4  MCH 28.7  MCHC 32.1  RDW 18.4*  LYMPHSABS 2.0  MONOABS 2.2*  EOSABS 0.0  BASOSABS 0.1   ------------------------------------------------------------------------------------------------------------------  Chemistries  Recent Labs  Lab 06/14/22 0830  NA 136  K 3.6  CL 99  CO2 25  GLUCOSE 118*  BUN 30*  CREATININE 2.40*  CALCIUM 8.2*  AST 25  ALT 28  ALKPHOS 85  BILITOT 1.0   ------------------------------------------------------------------------------------------------------------------ estimated creatinine clearance is 31.7 mL/min (A) (by C-G formula based on SCr of 2.4 mg/dL (H)). ------------------------------------------------------------------------------------------------------------------ ---------------------------------------------------------------------------------------------------------------  Urinalysis    Component Value Date/Time   COLORURINE YELLOW 06/14/2022 1210   APPEARANCEUR CLEAR 06/14/2022 1210   LABSPEC 1.010 06/14/2022 1210   PHURINE 6.0 06/14/2022 1210   GLUCOSEU NEGATIVE 06/14/2022 1210   HGBUR SMALL (A) 06/14/2022 1210   BILIRUBINUR NEGATIVE 06/14/2022 1210   KETONESUR NEGATIVE 06/14/2022 1210   PROTEINUR NEGATIVE 06/14/2022 1210   NITRITE NEGATIVE 06/14/2022 1210   LEUKOCYTESUR NEGATIVE 06/14/2022 1210    Imaging Results:    CT Chest Wo Contrast  Result Date: 06/14/2022 CLINICAL DATA:  Pneumonia.  EXAM: CT CHEST WITHOUT CONTRAST TECHNIQUE: Multidetector CT imaging of the chest was performed following the standard protocol without IV contrast. RADIATION DOSE REDUCTION: This exam was performed according to the departmental dose-optimization program which includes automated exposure control, adjustment of the mA and/or kV according to patient size and/or use of iterative reconstruction technique. COMPARISON:  June 04, 2022. FINDINGS: Cardiovascular: Atherosclerosis of thoracic aorta is noted without aneurysm formation. Normal cardiac size. No pericardial effusion. Mild coronary artery calcifications are noted. Mediastinum/Nodes: Thyroid gland and esophagus are unremarkable. Stable calcified adenopathy is noted most consistent with prior granulomatous disease. Lungs/Pleura: Right lung is clear. No definite pneumothorax is noted. Moderate size loculated left pleural effusion is again noted; there does appear to be the interval development of  several foci of gas within this fluid collection which may represent either infection or attempted intervention. Mild associated atelectasis of the left upper and lower lobes is noted. Upper Abdomen: Calcified splenic granulomas are again noted. Musculoskeletal: No chest wall mass or suspicious bone lesions identified. IMPRESSION: Moderate size loculated left pleural effusion is again noted and not significantly changed in size. There does appear to be the interval development of several foci of gas within the fluid collection which may represent either infection or attempted iatrogenic intervention Mild coronary artery calcifications are noted. Aortic Atherosclerosis (ICD10-I70.0). Electronically Signed   By: Marijo Conception M.D.   On: 06/14/2022 09:59   DG Chest Port 1 View  Result Date: 06/14/2022 CLINICAL DATA:  Hypotension with generalized weakness. EXAM: PORTABLE CHEST 1 VIEW COMPARISON:  06/05/2022 FINDINGS: Right lung clear. Loculated left pleural effusion is  progressive in the interval, now moderate to large in size with associated left base collapse/consolidation. Cardiopericardial silhouette is at upper limits of normal for size. The visualized bony structures of the thorax are unremarkable. Telemetry leads overlie the chest. IMPRESSION: Interval progression of loculated left pleural effusion, now moderate to large in size. Left base collapse/consolidation. Electronically Signed   By: Misty Stanley M.D.   On: 06/14/2022 09:04    Radiological Exams on Admission: CT Chest Wo Contrast  Result Date: 06/14/2022 CLINICAL DATA:  Pneumonia. EXAM: CT CHEST WITHOUT CONTRAST TECHNIQUE: Multidetector CT imaging of the chest was performed following the standard protocol without IV contrast. RADIATION DOSE REDUCTION: This exam was performed according to the departmental dose-optimization program which includes automated exposure control, adjustment of the mA and/or kV according to patient size and/or use of iterative reconstruction technique. COMPARISON:  June 04, 2022. FINDINGS: Cardiovascular: Atherosclerosis of thoracic aorta is noted without aneurysm formation. Normal cardiac size. No pericardial effusion. Mild coronary artery calcifications are noted. Mediastinum/Nodes: Thyroid gland and esophagus are unremarkable. Stable calcified adenopathy is noted most consistent with prior granulomatous disease. Lungs/Pleura: Right lung is clear. No definite pneumothorax is noted. Moderate size loculated left pleural effusion is again noted; there does appear to be the interval development of several foci of gas within this fluid collection which may represent either infection or attempted intervention. Mild associated atelectasis of the left upper and lower lobes is noted. Upper Abdomen: Calcified splenic granulomas are again noted. Musculoskeletal: No chest wall mass or suspicious bone lesions identified. IMPRESSION: Moderate size loculated left pleural effusion is again noted and  not significantly changed in size. There does appear to be the interval development of several foci of gas within the fluid collection which may represent either infection or attempted iatrogenic intervention Mild coronary artery calcifications are noted. Aortic Atherosclerosis (ICD10-I70.0). Electronically Signed   By: Marijo Conception M.D.   On: 06/14/2022 09:59   DG Chest Port 1 View  Result Date: 06/14/2022 CLINICAL DATA:  Hypotension with generalized weakness. EXAM: PORTABLE CHEST 1 VIEW COMPARISON:  06/05/2022 FINDINGS: Right lung clear. Loculated left pleural effusion is progressive in the interval, now moderate to large in size with associated left base collapse/consolidation. Cardiopericardial silhouette is at upper limits of normal for size. The visualized bony structures of the thorax are unremarkable. Telemetry leads overlie the chest. IMPRESSION: Interval progression of loculated left pleural effusion, now moderate to large in size. Left base collapse/consolidation. Electronically Signed   By: Misty Stanley M.D.   On: 06/14/2022 09:04    DVT Prophylaxis -SCD/heparin AM Labs Ordered, also please review Full Orders  Family  Communication: Admission, patients condition and plan of care including tests being ordered have been discussed with the patient who indicate understanding and agree with the plan   Condition  -stable  Roxan Hockey M.D on 06/14/2022 at 9:20 PM Go to [email or web address] -  for contact info  [facility] - Office  [phone]

## 2022-06-14 NOTE — ED Notes (Signed)
Patient given a urinal. Patient is aware that we need a urine sample

## 2022-06-15 ENCOUNTER — Inpatient Hospital Stay (HOSPITAL_COMMUNITY): Payer: Medicare PPO

## 2022-06-15 ENCOUNTER — Encounter (HOSPITAL_COMMUNITY): Payer: Self-pay | Admitting: Family Medicine

## 2022-06-15 DIAGNOSIS — N179 Acute kidney failure, unspecified: Secondary | ICD-10-CM

## 2022-06-15 DIAGNOSIS — D72825 Bandemia: Secondary | ICD-10-CM | POA: Diagnosis not present

## 2022-06-15 DIAGNOSIS — J189 Pneumonia, unspecified organism: Secondary | ICD-10-CM | POA: Diagnosis not present

## 2022-06-15 DIAGNOSIS — J90 Pleural effusion, not elsewhere classified: Secondary | ICD-10-CM | POA: Diagnosis not present

## 2022-06-15 DIAGNOSIS — I959 Hypotension, unspecified: Secondary | ICD-10-CM

## 2022-06-15 DIAGNOSIS — F17200 Nicotine dependence, unspecified, uncomplicated: Secondary | ICD-10-CM

## 2022-06-15 DIAGNOSIS — E872 Acidosis, unspecified: Secondary | ICD-10-CM

## 2022-06-15 DIAGNOSIS — J449 Chronic obstructive pulmonary disease, unspecified: Secondary | ICD-10-CM

## 2022-06-15 LAB — CBC WITH DIFFERENTIAL/PLATELET
Abs Immature Granulocytes: 0.18 10*3/uL — ABNORMAL HIGH (ref 0.00–0.07)
Basophils Absolute: 0.1 10*3/uL (ref 0.0–0.1)
Basophils Relative: 0 %
Eosinophils Absolute: 0 10*3/uL (ref 0.0–0.5)
Eosinophils Relative: 0 %
HCT: 27.2 % — ABNORMAL LOW (ref 39.0–52.0)
Hemoglobin: 9.1 g/dL — ABNORMAL LOW (ref 13.0–17.0)
Immature Granulocytes: 1 %
Lymphocytes Relative: 18 %
Lymphs Abs: 3 10*3/uL (ref 0.7–4.0)
MCH: 29.2 pg (ref 26.0–34.0)
MCHC: 33.5 g/dL (ref 30.0–36.0)
MCV: 87.2 fL (ref 80.0–100.0)
Monocytes Absolute: 1.3 10*3/uL — ABNORMAL HIGH (ref 0.1–1.0)
Monocytes Relative: 8 %
Neutro Abs: 12.3 10*3/uL — ABNORMAL HIGH (ref 1.7–7.7)
Neutrophils Relative %: 73 %
Platelets: 630 10*3/uL — ABNORMAL HIGH (ref 150–400)
RBC: 3.12 MIL/uL — ABNORMAL LOW (ref 4.22–5.81)
RDW: 18.1 % — ABNORMAL HIGH (ref 11.5–15.5)
WBC: 16.8 10*3/uL — ABNORMAL HIGH (ref 4.0–10.5)
nRBC: 0 % (ref 0.0–0.2)

## 2022-06-15 LAB — COMPREHENSIVE METABOLIC PANEL
ALT: 28 U/L (ref 0–44)
ALT: 24 U/L (ref 0–44)
AST: 28 U/L (ref 15–41)
AST: 35 U/L (ref 15–41)
Albumin: 1.7 g/dL — ABNORMAL LOW (ref 3.5–5.0)
Albumin: 1.8 g/dL — ABNORMAL LOW (ref 3.5–5.0)
Alkaline Phosphatase: 70 U/L (ref 38–126)
Alkaline Phosphatase: 79 U/L (ref 38–126)
Anion gap: 9 (ref 5–15)
Anion gap: 9 (ref 5–15)
BUN: 18 mg/dL (ref 8–23)
BUN: 18 mg/dL (ref 8–23)
CO2: 24 mmol/L (ref 22–32)
CO2: 22 mmol/L (ref 22–32)
Calcium: 8.1 mg/dL — ABNORMAL LOW (ref 8.9–10.3)
Calcium: 8.2 mg/dL — ABNORMAL LOW (ref 8.9–10.3)
Chloride: 103 mmol/L (ref 98–111)
Chloride: 104 mmol/L (ref 98–111)
Creatinine, Ser: 1.65 mg/dL — ABNORMAL HIGH (ref 0.61–1.24)
Creatinine, Ser: 1.55 mg/dL — ABNORMAL HIGH (ref 0.61–1.24)
GFR, Estimated: 46 mL/min — ABNORMAL LOW (ref >60)
GFR, Estimated: 49 mL/min — ABNORMAL LOW (ref >60)
Glucose, Bld: 83 mg/dL (ref 70–99)
Glucose, Bld: 89 mg/dL (ref 70–99)
Potassium: 4.2 mmol/L (ref 3.5–5.1)
Potassium: 4.3 mmol/L (ref 3.5–5.1)
Sodium: 136 mmol/L (ref 135–145)
Sodium: 135 mmol/L (ref 135–145)
Total Bilirubin: 0.6 mg/dL (ref 0.3–1.2)
Total Bilirubin: 0.9 mg/dL (ref 0.3–1.2)
Total Protein: 5.5 g/dL — ABNORMAL LOW (ref 6.5–8.1)
Total Protein: 5.5 g/dL — ABNORMAL LOW (ref 6.5–8.1)

## 2022-06-15 LAB — CBC
HCT: 26.9 % — ABNORMAL LOW (ref 39.0–52.0)
Hemoglobin: 8.7 g/dL — ABNORMAL LOW (ref 13.0–17.0)
MCH: 28.8 pg (ref 26.0–34.0)
MCHC: 32.3 g/dL (ref 30.0–36.0)
MCV: 89.1 fL (ref 80.0–100.0)
Platelets: 652 10*3/uL — ABNORMAL HIGH (ref 150–400)
RBC: 3.02 MIL/uL — ABNORMAL LOW (ref 4.22–5.81)
RDW: 18.3 % — ABNORMAL HIGH (ref 11.5–15.5)
WBC: 18.2 10*3/uL — ABNORMAL HIGH (ref 4.0–10.5)
nRBC: 0 % (ref 0.0–0.2)

## 2022-06-15 LAB — RESPIRATORY PANEL BY PCR

## 2022-06-15 LAB — LACTATE DEHYDROGENASE: LDH: 95 U/L — ABNORMAL LOW (ref 98–192)

## 2022-06-15 LAB — STREP PNEUMONIAE URINARY ANTIGEN: Strep Pneumo Urinary Antigen: NEGATIVE

## 2022-06-15 MED ORDER — LIDOCAINE HCL (PF) 1 % IJ SOLN
INTRAMUSCULAR | Status: AC
  Filled 2022-06-15: qty 30

## 2022-06-15 MED ORDER — FENTANYL CITRATE (PF) 100 MCG/2ML IJ SOLN
INTRAMUSCULAR | Status: AC
  Filled 2022-06-15: qty 4

## 2022-06-15 MED ORDER — CEFAZOLIN SODIUM-DEXTROSE 2-4 GM/100ML-% IV SOLN
2.0000 g | INTRAVENOUS | Status: AC
  Filled 2022-06-15: qty 100

## 2022-06-15 MED ORDER — MIDAZOLAM HCL 2 MG/2ML IJ SOLN
INTRAMUSCULAR | Status: AC
  Filled 2022-06-15: qty 4

## 2022-06-15 NOTE — Progress Notes (Signed)
NAME:  David Callahan, MRN:  [id number], DOB:  11/16/1955, LOS: 1 ADMISSION DATE:  06/14/2022, CONSULTATION DATE:  06/14/2022 REFERRING MD:  Almon Hercules, MD, CHIEF COMPLAINT:  pleural effusion  History of Present Illness:  80 y9o man with history of COPD and ongoing tobacco use disorder. Recent hospitalization for COPD exacerbation and left pleural effusion in August 2023. This was drained by IR, but not fully drained - was exudative. He was discharged home on abx 8/26 and represented to the ED at [facility] 9/2 for worsening shortness of breath. Found to have persistent let pleural effusion,   Pertinent  Medical History  Copd Tobacco use disorder  Significant Hospital Events: Including procedures, antibiotic start and stop dates in addition to other pertinent events     Interim History / Subjective:  Short of breath. Hungry.   Objective   Blood pressure 109/65, pulse 73, temperature 98.9 F (37.2 C), temperature source Oral, resp. rate 18, height 5\' 10"  (1.778 m), weight 85.4 kg, SpO2 92 %.        Intake/Output Summary (Last 24 hours) at 06/15/2022 1446 Last data filed at 06/15/2022 0946 Gross per 24 hour  Intake 1163.01 ml  Output 2925 ml  Net -1761.99 ml   Filed Weights   06/14/22 0811 06/15/22 0500  Weight: 86.2 kg 85.4 kg    Examination: General: sitting up in bed on nasal cannula HENT: mmm Lungs: breath sounds diminished bilaterally, worse in the left lung base Cardiovascular: RRR no mrg Abdomen: soft, nontender Extremities: no edema Neuro: normal speech no focal asymmetry   Resolved Hospital Problem list     Assessment & Plan:    Persistent left pleural effusion, exudative, suspected parapneumonic vs empyema COPD with ongoing tobacco use disorder  Broad spectrum abx Continue home inahlers Plan for chest tube drainage with probable intrapleural fibrinolytic therapy Nicotine patch  Rest per primary team.  08/15/22, MD Pulmonary and Critical Care  Medicine [facility] 06/15/2022 2:53 PM Pager: see AMION  If no response to pager, please call critical care on call (see AMION) until 7pm After 7:00 pm call Elink     Labs   CBC: Recent Labs  Lab 06/14/22 0830 06/15/22 0854  WBC 24.8* 16.8*  NEUTROABS 20.1* 12.3*  HGB 9.5* 9.1*  HCT 29.6* 27.2*  MCV 89.4 87.2  PLT 670* 630*    Basic Metabolic Panel: Recent Labs  Lab 06/14/22 0830 06/15/22 0854  NA 136 135  K 3.6 4.3  CL 99 104  CO2 25 22  GLUCOSE 118* 89  BUN 30* 18  CREATININE 2.40* 1.55*  CALCIUM 8.2* 8.2*   GFR: Estimated Creatinine Clearance: 49.1 mL/min (A) (by C-G formula based on SCr of 1.55 mg/dL (H)). Recent Labs  Lab 06/14/22 0830 06/14/22 0850 06/14/22 1059 06/14/22 2052 06/15/22 0854  PROCALCITON  --   --   --  0.48  --   WBC 24.8*  --   --   --  16.8*  LATICACIDVEN  --  2.5* 2.0* 1.4  --     Liver Function Tests: Recent Labs  Lab 06/14/22 0830 06/15/22 0854  AST 25 35  ALT 28 24  ALKPHOS 85 79  BILITOT 1.0 0.9  PROT 6.5 5.5*  ALBUMIN 2.2* 1.8*   Recent Labs  Lab 06/14/22 0830  LIPASE 22   No results for input(s): "AMMONIA" in the last 168 hours.  ABG    Component Value Date/Time   HCO3 31.6 (H) 06/04/2022 2032  O2SAT 30.9 06/04/2022 2032     Coagulation Profile: No results for input(s): "INR", "PROTIME" in the last 168 hours.  Cardiac Enzymes: No results for input(s): "CKTOTAL", "CKMB", "CKMBINDEX", "TROPONINI" in the last 168 hours.  HbA1C: Hgb A1c MFr Bld  Date/Time Value Ref Range Status  06/05/2022 05:03 AM 5.4 4.8 - 5.6 % Final    Comment:    (NOTE) Pre diabetes:          5.7%-6.4%  Diabetes:              >6.4%  Glycemic control for   <7.0% adults with diabetes     CBG: Recent Labs  Lab 06/14/22 0817  GLUCAP 131*

## 2022-06-15 NOTE — Consult Note (Signed)
Chief Complaint: Patient was seen in consultation today for pleural effusion at the request of Critical Care  Referring Physician(s): Dr. Kalman Shan  Supervising Physician: Simonne Come  Patient Status: [facility] - In-pt  History of Present Illness: David Callahan is a 65 y.o. male who lives alone with COPD, ongoing smoking.  Originally presented to [facility] admission on 06/04/2022 with 4 days of shortness of breath cough and clear sputum without any fever.  He was found to have lactic acidosis 2.3 and acute kidney injury 2 mg percent and CT chest revealed a large loculated left pleural effusion on 06/04/2022. He underwent thoracentesis and drained 650 mL which was exudative LDH 610 [cytology negative, culture negative] he was treated with Antibiotics and then discharged home on 06/07/2022.    He represented to [facility] emergency department 06/14/2022 with lightheadedness and hypotension that was corrected with fluids along with mild lactic acidosis.  Chest x-ray showed persistence of the left loculated pleural effusion (empyema).  Determined to be clinically infectious (MRSA PCR+).  IR asked to see for placement of left chest tube pig tail catheter for fibrinolytic therapy.    Past Medical History:  Diagnosis Date   Asthma    COPD (chronic obstructive pulmonary disease) (HCC)     Past Surgical History:  Procedure Laterality Date   KNEE SURGERY      Allergies: Motrin [ibuprofen]  Medications: Prior to Admission medications   Medication Sig Start Date End Date Taking? Authorizing Provider  albuterol (VENTOLIN HFA) 108 (90 Base) MCG/ACT inhaler Inhale 2 puffs into the lungs every 6 (six) hours as needed for shortness of breath.   Yes [provider]  atorvastatin (LIPITOR) 40 MG tablet Take 40 mg by mouth daily.   Yes [provider]  benazepril (LOTENSIN) 40 MG tablet Take 40 mg by mouth daily. 09/18/20  Yes [provider]   budesonide-formoterol (SYMBICORT) 160-4.5 MCG/ACT inhaler Inhale 2 puffs into the lungs daily as needed (short of breath).   Yes [provider]  cyclobenzaprine (FLEXERIL) 10 MG tablet Take 10 mg by mouth at bedtime. 09/17/20  Yes [provider]  dextromethorphan-guaiFENesin (MUCINEX DM) 30-600 MG 12hr tablet Take 1 tablet by mouth 2 (two) times daily. 06/07/22  Yes Kathlen Mody, MD  diltiazem (CARDIZEM CD) 300 MG 24 hr capsule Take 300 mg by mouth daily. 09/18/20  Yes [provider]  HYDROcodone-acetaminophen (NORCO/VICODIN) 5-325 MG tablet Take 2 tablets by mouth every 6 (six) hours as needed for moderate pain or severe pain. 08/17/20  Yes [provider]  Ipratropium-Albuterol (COMBIVENT RESPIMAT) 20-100 MCG/ACT AERS respimat Inhale 1 puff into the lungs every 6 (six) hours as needed for wheezing. 10/08/20  Yes Shah, Pratik D, DO  levofloxacin (LEVAQUIN) 750 MG tablet Take 750 mg by mouth daily. Starting 08.23.23 x 7 days. 06/04/22  Yes [provider]  rOPINIRole (REQUIP) 0.25 MG tablet Take 0.75 mg by mouth at bedtime. 09/18/20  Yes [provider]  traZODone (DESYREL) 50 MG tablet Take 50 mg by mouth at bedtime. 09/26/20  Yes [provider]  Dwyane Luo 200-62.5-25 MCG/ACT AEPB Inhale 1 puff into the lungs daily. 01/03/22  Yes [provider]     History reviewed. No pertinent family history.  Social History   Socioeconomic History   Marital status: Divorced    Spouse name: Not on file   Number of children: Not on file   Years of education: Not on file   Highest education  level: Not on file  Occupational History   Not on file  Tobacco Use   Smoking status: Some Days    Packs/day: 0.50    Types: Cigarettes   Smokeless tobacco: Never  Vaping Use   Vaping Use: Never used  Substance and Sexual Activity   Alcohol use: Yes    Comment: ocassional homemade moonshine per pt   Drug use: Never   Sexual activity:  Not on file  Other Topics Concern   Not on file  Social History Narrative   Not on file   Social Determinants of Health   Financial Resource Strain: Not on file  Food Insecurity: Not on file  Transportation Needs: Not on file  Physical Activity: Not on file  Stress: Not on file  Social Connections: Not on file    Review of Systems  Constitutional:  Positive for activity change.  HENT: Negative.    Eyes: Negative.   Respiratory:  Positive for cough and shortness of breath.   Cardiovascular: Negative.   Gastrointestinal: Negative.   Endocrine: Negative.   Genitourinary: Negative.   Musculoskeletal: Negative.   Skin: Negative.   Allergic/Immunologic: Negative.   Neurological:  Positive for dizziness and weakness.  Hematological:  Bruises/bleeds easily.  Psychiatric/Behavioral: Negative.      Vital Signs: BP 114/78 (BP Location: Left Arm)   Pulse 69   Temp 98 F (36.7 C) (Axillary)   Resp 20   Ht 5\' 10"  (1.778 m)   Wt 188 lb 4.4 oz (85.4 kg)   SpO2 93%   BMI 27.01 kg/m   Physical Exam Constitutional:      General: He is not in acute distress. HENT:     Head: Normocephalic and atraumatic.     Mouth/Throat:     Mouth: Mucous membranes are moist.     Pharynx: Oropharynx is clear.  Eyes:     Extraocular Movements: Extraocular movements intact.     Conjunctiva/sclera: Conjunctivae normal.  Cardiovascular:     Rate and Rhythm: Normal rate.     Pulses: Normal pulses.  Pulmonary:     Comments: Mildly SOB when speaking. Decreased lung sounds on left Skin:    General: Skin is warm and dry.  Neurological:     General: No focal deficit present.     Mental Status: He is alert and oriented to person, place, and time.  Psychiatric:        Mood and Affect: Mood normal.        Behavior: Behavior normal.     Imaging: CT Chest Wo Contrast  Result Date: 06/14/2022 CLINICAL DATA:  Pneumonia. EXAM: CT CHEST WITHOUT CONTRAST TECHNIQUE: Multidetector CT imaging of the  chest was performed following the standard protocol without IV contrast. RADIATION DOSE REDUCTION: This exam was performed according to the departmental dose-optimization program which includes automated exposure control, adjustment of the mA and/or kV according to patient size and/or use of iterative reconstruction technique. COMPARISON:  June 04, 2022. FINDINGS: Cardiovascular: Atherosclerosis of thoracic aorta is noted without aneurysm formation. Normal cardiac size. No pericardial effusion. Mild coronary artery calcifications are noted. Mediastinum/Nodes: Thyroid gland and esophagus are unremarkable. Stable calcified adenopathy is noted most consistent with prior granulomatous disease. Lungs/Pleura: Right lung is clear. No definite pneumothorax is noted. Moderate size loculated left pleural effusion is again noted; there does appear to be the interval development of several foci of gas within this fluid collection which may represent either infection or attempted intervention. Mild associated atelectasis of the left upper  and lower lobes is noted. Upper Abdomen: Calcified splenic granulomas are again noted. Musculoskeletal: No chest wall mass or suspicious bone lesions identified. IMPRESSION: Moderate size loculated left pleural effusion is again noted and not significantly changed in size. There does appear to be the interval development of several foci of gas within the fluid collection which may represent either infection or attempted iatrogenic intervention Mild coronary artery calcifications are noted. Aortic Atherosclerosis (ICD10-I70.0). Electronically Signed   By: Lupita Raider M.D.   On: 06/14/2022 09:59   DG Chest Port 1 View  Result Date: 06/14/2022 CLINICAL DATA:  Hypotension with generalized weakness. EXAM: PORTABLE CHEST 1 VIEW COMPARISON:  06/05/2022 FINDINGS: Right lung clear. Loculated left pleural effusion is progressive in the interval, now moderate to large in size with associated left  base collapse/consolidation. Cardiopericardial silhouette is at upper limits of normal for size. The visualized bony structures of the thorax are unremarkable. Telemetry leads overlie the chest. IMPRESSION: Interval progression of loculated left pleural effusion, now moderate to large in size. Left base collapse/consolidation. Electronically Signed   By: Kennith Center M.D.   On: 06/14/2022 09:04   DG Chest 1 View  Result Date: 06/05/2022 CLINICAL DATA:  Partially loculated LEFT pleural effusion post thoracentesis EXAM: CHEST  1 VIEW COMPARISON:  12/10/2021 FINDINGS: Normal heart size, mediastinal contours, and pulmonary vascularity. Atherosclerotic calcification aorta. Small to moderate LEFT pleural effusion and basilar atelectasis. No pneumothorax following thoracentesis. RIGHT lung clear. Advanced RIGHT glenohumeral degenerative changes. IMPRESSION: No pneumothorax following LEFT thoracentesis. Small to moderate LEFT pleural effusion and basilar atelectasis. Aortic Atherosclerosis (ICD10-I70.0). Electronically Signed   By: Ulyses Southward M.D.   On: 06/05/2022 09:59   US THORACENTESIS ASP PLEURAL SPACE W/IMG GUIDE  Result Date: 06/05/2022 INDICATION: Left pleural effusion EXAM: ULTRASOUND GUIDED LEFT THORACENTESIS MEDICATIONS: 9 cc 1% lidocaine. COMPLICATIONS: None immediate. PROCEDURE: An ultrasound guided thoracentesis was thoroughly discussed with the patient and questions answered. The benefits, risks, alternatives and complications were also discussed. The patient understands and wishes to proceed with the procedure. Written consent was obtained. Ultrasound was performed to localize and mark an adequate pocket of fluid in the Left chest. The area was then prepped and draped in the normal sterile fashion. 1% Lidocaine was used for local anesthesia. Under ultrasound guidance a Yueh catheter was introduced. Thoracentesis was performed. The catheter was removed and a dressing applied. FINDINGS: A total of  approximately 650 cc of yellow fluid was removed. Samples were sent to the laboratory as requested by the clinical team. IMPRESSION: Successful ultrasound guided left thoracentesis yielding 650 cc of pleural fluid. Post CXR: No PTX per Dr Tyron Russell Read by Robet Leu [facility] Electronically Signed   By: Ulyses Southward M.D.   On: 06/05/2022 09:52   CT Chest Wo Contrast  Result Date: 06/04/2022 CLINICAL DATA:  Pneumonia, complication suspected, xray done EXAM: CT CHEST WITHOUT CONTRAST TECHNIQUE: Multidetector CT imaging of the chest was performed following the standard protocol without IV contrast. RADIATION DOSE REDUCTION: This exam was performed according to the departmental dose-optimization program which includes automated exposure control, adjustment of the mA and/or kV according to patient size and/or use of iterative reconstruction technique. COMPARISON:  12/10/2021. FINDINGS: Cardiovascular: Moderate coronary artery and aortic calcifications. Heart is normal size. Aorta is normal caliber. Mediastinum/Nodes: No mediastinal, hilar, or axillary adenopathy. Trachea and esophagus are unremarkable. Thyroid unremarkable. Lungs/Pleura: Moderate loculated left pleural effusion. Airspace disease in the left lower lobe and to a lesser extent lingula which could  reflect atelectasis or pneumonia. Right lung clear. Upper Abdomen: Calcifications in the liver and spleen compatible with old granulomatous disease. Musculoskeletal: Chest wall soft tissues are unremarkable. No acute bony abnormality. IMPRESSION: Moderate loculated left pleural effusion. Airspace disease in the left lower lobe concerning for pneumonia. Lingular opacity likely reflects atelectasis. Old granulomatous disease. Coronary artery disease. Aortic Atherosclerosis (ICD10-I70.0). Electronically Signed   By: Charlett Nose M.D.   On: 06/04/2022 21:54    Labs:  CBC: Recent Labs    06/06/22 1200 06/07/22 0419 06/14/22 0830 06/15/22 0854  WBC 27.6* 23.4*  24.8* 16.8*  HGB 9.9* 10.0* 9.5* 9.1*  HCT 30.6* 31.6* 29.6* 27.2*  PLT 554* 591* 670* 630*    COAGS: Recent Labs    06/04/22 2012  INR 1.4*    BMP: Recent Labs    06/06/22 1200 06/07/22 0419 06/14/22 0830 06/15/22 0854  NA 139 137 136 135  K 3.1* 3.4* 3.6 4.3  CL 100 102 99 104  CO2 28 27 25 22   GLUCOSE 180* 102* 118* 89  BUN 30* 27* 30* 18  CALCIUM 8.4* 8.3* 8.2* 8.2*  CREATININE 1.31* 1.27* 2.40* 1.55*  GFRNONAA >60 >60 29* 49*    LIVER FUNCTION TESTS: Recent Labs    06/05/22 0503 06/06/22 1200 06/14/22 0830 06/15/22 0854  BILITOT 0.8 0.6 1.0 0.9  AST 35 149* 25 35  ALT 24 73* 28 24  ALKPHOS 83 79 85 79  PROT 6.6 6.2* 6.5 5.5*  ALBUMIN 2.2* 2.0* 2.2* 1.8*    Assessment and Plan:  Left Empyema --Case reviewed and approved by Dr. 08/15/22 for left pig tail catheter chest tube placement --Pt is NPO and Ancef ordered  Risks and benefits discussed with the patient including bleeding, infection, damage to adjacent structures, and sepsis.  All of the patient's questions were answered, patient is agreeable to proceed. Consent signed and in IR.   Thank you for this interesting consult.  I greatly enjoyed meeting David Callahan and look forward to participating in their care.  A copy of this report was sent to the requesting provider on this date.  Electronically Signed: Leana Gamer, PA 06/15/2022, 11:08 AM   I spent a total of 40 Minutes in face to face in clinical consultation, greater than 50% of which was counseling/coordinating care for empyema

## 2022-06-15 NOTE — Progress Notes (Signed)
PROGRESS NOTE  David Callahan RUE:[id number] DOB: 1956/07/08   PCP: Patient, No Pcp Per  Patient is from: Home.  Lives alone.  Independently ambulates at baseline.  DOA: 06/14/2022 LOS: 1  Chief complaints Chief Complaint  Patient presents with   Weakness   Dizziness     Brief Narrative / Interim history: 66 year old M with PMH of COPD, HTN, tobacco use disorder, recurrent pleural effusion, recent hospitalization from 8/23-28 for CAP/exudative pleural effusion s/p thoracocentesis, and AKI returning to [facility] with increased shortness of breath, dizziness and low blood pressure, and admitted for recurrent exudative left pleural effusion.  CT chest showed moderate left loculated pleural effusion with gas.  He has WBC of 25 with left shift.  Creatinine 2.4 (1.3 on discharge recently).  Lactic acid 2.5.  Patient was started on vancomycin and cefepime.  Pulmonology consulted.  Patient was transferred to [facility] for further evaluation and management.  Subjective: Seen and examined earlier this morning.  No major events overnight of this morning.  No complaints.  Some pain when he lies down on his left side.  Denies shortness of breath.  Denies GI or UTI symptoms.  Objective: Vitals:   06/14/22 2008 06/15/22 0004 06/15/22 0500 06/15/22 0831  BP: 130/84 114/63 114/78   Pulse:  74 73 69  Resp: 19 18 17 20   Temp: 98.3 F (36.8 C) 97.9 F (36.6 C) 98 F (36.7 C)   TempSrc: Oral Oral Axillary   SpO2: 100% 94% 100% 93%  Weight:   85.4 kg   Height:        Examination:  GENERAL: No apparent distress.  Nontoxic. HEENT: MMM.  Vision and hearing grossly intact.  NECK: Supple.  No apparent JVD.  RESP:  No IWOB.  Fair aeration bilaterally.  Rhonchi bilaterally. CVS:  RRR. Heart sounds normal.  ABD/GI/GU: BS+. Abd soft, NTND.  MSK/EXT:  Moves extremities. No apparent deformity. No edema.  SKIN: no apparent skin lesion or wound NEURO: Awake, alert and oriented appropriately.   No apparent focal neuro deficit. PSYCH: Calm. Normal affect.   Procedures:  None  Microbiology summarized: COVID-19 PCR nonreactive. Full RVP nonreactive. Blood cultures NGTD. MRSA PCR screen positive.  Assessment and plan: Principal Problem:   Pleural effusion Active Problems:   COPD (chronic obstructive pulmonary disease) (HCC)   Leukocytosis   CAP (community acquired pneumonia)   AKI (acute kidney injury) (HCC)  Community-acquired pneumonia with recurrent pleural effusion-POA.  Hospitalized 8/23-28 and had thoracocentesis with removal of exudative and cultures negative fluid.  He was discharged on Levaquin.  Returns with shortness of breath, hypotension and near syncope.  Had leukocytosis to 25 with left shift.  Lactic acidosis of 2.5.  CT chest as above.  MRSA PCR screen positive.  RVP and COVID-19 PCR negative.  Blood cultures NGTD. -Continue vancomycin and cefepime -Plan for chest tube placement by pulmonology today -Follow QuantiFERON gold    Chronic COPD/tobacco abuse- -continue bronchodilators and nicotine patch -Encouraged smoking cessation.   Hypotension/history of hypertension: Hypotension resolved.   -Continue holding home antihypertensive meds  AKI: Likely prerenal in the setting of sepsis and hypotension.  Resolving. Recent Labs    06/04/22 1915 06/05/22 0503 06/06/22 1200 06/07/22 0419 06/14/22 0830 06/15/22 0854  BUN 38* 35* 30* 27* 30* 18  CREATININE 2.05* 1.72* 1.31* 1.27* 2.40* 1.55*  -Continue monitoring -Avoid nephrotoxic meds    Sepsis ruled out.  Restless leg syndrome -continue Requip   Anemia of chronic disease with iron deficiency:  Slight drop in Hgb likely dilutional.  No evidence of bleeding. Recent Labs    03/25/22 1006 06/04/22 1915 06/05/22 0503 06/06/22 1200 06/07/22 0419 06/14/22 0830 06/15/22 0854  HGB 11.6* 11.6* 10.5* 9.9* 10.0* 9.5* 9.1*  -Monitor H&H -Hold off IV iron for now.  Thrombocytosis: Likely  reactive. -Monitor.  Bandemia: Likely due to #1.  Improving. -Continue monitoring  Lactic acidosis: Likely due to #1.  Resolved.  Body mass index is 27.01 kg/m.           DVT prophylaxis:  heparin injection 5,000 Units Start: 06/14/22 2200 SCDs Start: 06/14/22 2116 Place TED hose Start: 06/14/22 2116 SCDs Start: 06/14/22 1740 Place TED hose Start: 06/14/22 1740  Code Status: Full code Family Communication: None at bedside Level of care: Telemetry Medical Status is: Inpatient Remains inpatient appropriate because: Community-acquired pneumonia with recurrent exudative pleural effusion   Final disposition: TBD Consultants:  Pulmonology  Sch Meds:  Scheduled Meds:  atorvastatin  40 mg Oral Daily   Chlorhexidine Gluconate Cloth  6 each Topical Q0600   cyclobenzaprine  10 mg Oral QHS   dextromethorphan-guaiFENesin  1 tablet Oral BID   fluticasone furoate-vilanterol  1 puff Inhalation Daily   And   umeclidinium bromide  1 puff Inhalation Daily   heparin  5,000 Units Subcutaneous Q8H   multivitamin with minerals  1 tablet Oral Daily   mupirocin ointment  1 Application Nasal BID   nicotine  21 mg Transdermal Daily   rOPINIRole  0.75 mg Oral QHS   sodium chloride flush  3 mL Intravenous Q12H   sodium chloride flush  3 mL Intravenous Q12H   sodium chloride flush  3 mL Intravenous Q12H   sodium chloride flush  3 mL Intravenous Q12H   traZODone  50 mg Oral QHS   Continuous Infusions:  sodium chloride     sodium chloride     sodium chloride 125 mL/hr at 06/15/22 0943   ceFEPime (MAXIPIME) IV 2 g (06/15/22 0944)   vancomycin 1,000 mg (06/15/22 1144)   PRN Meds:.sodium chloride, sodium chloride, acetaminophen **OR** acetaminophen, albuterol, bisacodyl, HYDROcodone-acetaminophen, ondansetron **OR** ondansetron (ZOFRAN) IV, polyethylene glycol, sodium chloride flush, sodium chloride flush, traZODone  Antimicrobials: Anti-infectives (From admission, onward)    Start      Dose/Rate Route Frequency Ordered Stop   06/15/22 1100  vancomycin (VANCOCIN) IVPB 1000 mg/200 mL premix        1,000 mg 200 mL/hr over 60 Minutes Intravenous Every 24 hours 06/14/22 1011     06/14/22 2200  ceFEPIme (MAXIPIME) 2 g in sodium chloride 0.9 % 100 mL IVPB        2 g 200 mL/hr over 30 Minutes Intravenous Every 12 hours 06/14/22 0943     06/14/22 0945  vancomycin (VANCOREADY) IVPB 2000 mg/400 mL        2,000 mg 200 mL/hr over 120 Minutes Intravenous  Once 06/14/22 0934 06/14/22 1212   06/14/22 0930  vancomycin (VANCOCIN) IVPB 1000 mg/200 mL premix  Status:  Discontinued        1,000 mg 200 mL/hr over 60 Minutes Intravenous  Once 06/14/22 0925 06/14/22 0934   06/14/22 0930  ceFEPIme (MAXIPIME) 2 g in sodium chloride 0.9 % 100 mL IVPB        2 g 200 mL/hr over 30 Minutes Intravenous  Once 06/14/22 0925 06/14/22 1017        I have personally reviewed the following labs and images: CBC: Recent Labs  Lab 06/14/22 0830 06/15/22 0854  WBC 24.8* 16.8*  NEUTROABS 20.1* 12.3*  HGB 9.5* 9.1*  HCT 29.6* 27.2*  MCV 89.4 87.2  PLT 670* 630*   BMP &GFR Recent Labs  Lab 06/14/22 0830 06/15/22 0854  NA 136 135  K 3.6 4.3  CL 99 104  CO2 25 22  GLUCOSE 118* 89  BUN 30* 18  CREATININE 2.40* 1.55*  CALCIUM 8.2* 8.2*   Estimated Creatinine Clearance: 49.1 mL/min (A) (by C-G formula based on SCr of 1.55 mg/dL (H)). Liver & Pancreas: Recent Labs  Lab 06/14/22 0830 06/15/22 0854  AST 25 35  ALT 28 24  ALKPHOS 85 79  BILITOT 1.0 0.9  PROT 6.5 5.5*  ALBUMIN 2.2* 1.8*   Recent Labs  Lab 06/14/22 0830  LIPASE 22   No results for input(s): "AMMONIA" in the last 168 hours. Diabetic: No results for input(s): "HGBA1C" in the last 72 hours. Recent Labs  Lab 06/14/22 0817  GLUCAP 131*   Cardiac Enzymes: No results for input(s): "CKTOTAL", "CKMB", "CKMBINDEX", "TROPONINI" in the last 168 hours. No results for input(s): "PROBNP" in the last 8760 hours. Coagulation  Profile: No results for input(s): "INR", "PROTIME" in the last 168 hours. Thyroid Function Tests: No results for input(s): "TSH", "T4TOTAL", "FREET4", "T3FREE", "THYROIDAB" in the last 72 hours. Lipid Profile: No results for input(s): "CHOL", "HDL", "LDLCALC", "TRIG", "CHOLHDL", "LDLDIRECT" in the last 72 hours. Anemia Panel: Recent Labs    06/14/22 2052  TIBC 164*  IRON 12*   Urine analysis:    Component Value Date/Time   COLORURINE YELLOW 06/14/2022 1210   APPEARANCEUR CLEAR 06/14/2022 1210   LABSPEC 1.010 06/14/2022 1210   PHURINE 6.0 06/14/2022 1210   GLUCOSEU NEGATIVE 06/14/2022 1210   HGBUR SMALL (A) 06/14/2022 1210   BILIRUBINUR NEGATIVE 06/14/2022 1210   KETONESUR NEGATIVE 06/14/2022 1210   PROTEINUR NEGATIVE 06/14/2022 1210   NITRITE NEGATIVE 06/14/2022 1210   LEUKOCYTESUR NEGATIVE 06/14/2022 1210   Sepsis Labs: Invalid input(s): "PROCALCITONIN", "LACTICIDVEN"  Microbiology: Recent Results (from the past 240 hour(s))  Culture, blood (Routine X 2) w Reflex to ID Panel     Status: None (Preliminary result)   Collection Time: 06/14/22  8:50 AM   Specimen: Left Antecubital; Blood  Result Value Ref Range Status   Specimen Description   Final    LEFT ANTECUBITAL BOTTLES DRAWN AEROBIC AND ANAEROBIC   Special Requests Blood Culture adequate volume  Final   Culture   Final    NO GROWTH < 24 HOURS Performed at [facility], [address], [place], [place] [address]    Report Status PENDING  Incomplete  SARS Coronavirus 2 by RT PCR (hospital order, performed in [facility] hospital lab) *cepheid single result test* Anterior Nasal Swab     Status: None   Collection Time: 06/14/22  8:58 AM   Specimen: Anterior Nasal Swab  Result Value Ref Range Status   SARS Coronavirus 2 by RT PCR NEGATIVE NEGATIVE Final    Comment: (NOTE) SARS-CoV-2 target nucleic acids are NOT DETECTED.  The SARS-CoV-2 RNA is generally detectable in upper and lower respiratory specimens during the  acute phase of infection. The lowest concentration of SARS-CoV-2 viral copies this assay can detect is 250 copies / mL. A negative result does not preclude SARS-CoV-2 infection and should not be used as the sole basis for treatment or other patient management decisions.  A negative result may occur with improper specimen collection / handling, submission of specimen other than nasopharyngeal swab, presence of viral mutation(s)  within the areas targeted by this assay, and inadequate number of viral copies (<250 copies / mL). A negative result must be combined with clinical observations, patient history, and epidemiological information.  Fact Sheet for Patients:   RoadLapTop.co.zahttps://www.fda.gov/media/158405/download  Fact Sheet for Healthcare Providers: http://kim-miller.com/https://www.fda.gov/media/158404/download  This test is not yet approved or  cleared by the [place] FDA and has been authorized for detection and/or diagnosis of SARS-CoV-2 by FDA under an Emergency Use Authorization (EUA).  This EUA will remain in effect (meaning this test can be used) for the duration of the COVID-19 declaration under Section 564(b)(1) of the Act, 21 U.S.C. section 360bbb-3(b)(1), unless the authorization is terminated or revoked sooner.  Performed at [facility], [address] [place], [place] [address]   Culture, blood (Routine X 2) w Reflex to ID Panel     Status: None (Preliminary result)   Collection Time: 06/14/22  9:09 AM   Specimen: BLOOD RIGHT HAND  Result Value Ref Range Status   Specimen Description   Final    BLOOD RIGHT HAND BOTTLES DRAWN AEROBIC AND ANAEROBIC   Special Requests   Final    Blood Culture results may not be optimal due to an excessive volume of blood received in culture bottles   Culture   Final    NO GROWTH < 24 HOURS Performed at [facility], [address] [place], [place] [address]    Report Status PENDING  Incomplete  MRSA Next Gen by PCR, Nasal     Status: Abnormal   Collection  Time: 06/14/22  9:45 AM   Specimen: Nasal Mucosa; Nasal Swab  Result Value Ref Range Status   MRSA by PCR Next Gen DETECTED (A) NOT DETECTED Final    Comment: RESULT CALLED TO, READ BACK BY AND VERIFIED WITH: EANES M @ 1138 ON [id number] BY HENDERSON L (NOTE) The GeneXpert MRSA Assay (FDA approved for NASAL specimens only), is one component of a comprehensive MRSA colonization surveillance program. It is not intended to diagnose MRSA infection nor to guide or monitor treatment for MRSA infections. Test performance is not FDA approved in patients less than 65 years old. Performed at [facility], [address] [place], [place] [address]   Respiratory (~20 pathogens) panel by PCR     Status: None   Collection Time: 06/14/22  9:30 PM   Specimen: Nasopharyngeal Swab; Respiratory  Result Value Ref Range Status   Adenovirus NOT DETECTED NOT DETECTED Final   Coronavirus 229E NOT DETECTED NOT DETECTED Final    Comment: (NOTE) The Coronavirus on the Respiratory Panel, DOES NOT test for the novel  Coronavirus (2019 nCoV)    Coronavirus HKU1 NOT DETECTED NOT DETECTED Final   Coronavirus NL63 NOT DETECTED NOT DETECTED Final   Coronavirus OC43 NOT DETECTED NOT DETECTED Final   Metapneumovirus NOT DETECTED NOT DETECTED Final   Rhinovirus / Enterovirus NOT DETECTED NOT DETECTED Final   Influenza A NOT DETECTED NOT DETECTED Final   Influenza B NOT DETECTED NOT DETECTED Final   Parainfluenza Virus 1 NOT DETECTED NOT DETECTED Final   Parainfluenza Virus 2 NOT DETECTED NOT DETECTED Final   Parainfluenza Virus 3 NOT DETECTED NOT DETECTED Final   Parainfluenza Virus 4 NOT DETECTED NOT DETECTED Final   Respiratory Syncytial Virus NOT DETECTED NOT DETECTED Final   Bordetella pertussis NOT DETECTED NOT DETECTED Final   Bordetella Parapertussis NOT DETECTED NOT DETECTED Final   Chlamydophila pneumoniae NOT DETECTED NOT DETECTED Final   Mycoplasma pneumoniae NOT DETECTED NOT DETECTED  Final    Comment:  Performed at [facility], [address], [place], [place] [address]    Radiology Studies: No results found.    AmeLie Hollars T. Yasseen Salls [facility]  If 7PM-7AM, please contact night-coverage [email or web address] 06/15/2022, 11:55 AM

## 2022-06-15 NOTE — Progress Notes (Deleted)
PHARMACY NOTE:  ANTIMICROBIAL RENAL DOSAGE ADJUSTMENT  Current antimicrobial regimen includes a mismatch between antimicrobial dosage and estimated renal function.  As per policy approved by the Pharmacy & Therapeutics and Medical Executive Committees, the antimicrobial dosage will be adjusted accordingly.  Current antimicrobial dosage:  Vancomycin 1000mg  IV q24h   Indication: Community Acquired pneumonia  Renal Function: CrCl 49.1--> Cr trending down from 2.01 to 1.55  Estimated Creatinine Clearance: 49.1 mL/min (A) (by C-G formula based on SCr of 1.55 mg/dL (H)).     Antimicrobial dosage has been changed to:  Vancomycin 100mg  IV q24--> 1250mg  IV q24  Additional comments: eAUC 450, ePeak 31, eTrough 11   Thank you for allowing pharmacy to be a part of this patient's care.  , PharmD. [facility] Acute [facility] PGY-1 06/15/2022 12:36 PM

## 2022-06-15 NOTE — Plan of Care (Signed)
  Problem: Clinical Measurements: Goal: Respiratory complications will improve Outcome: Progressing Goal: Cardiovascular complication will be avoided Outcome: Progressing   Problem: Activity: Goal: Risk for activity intolerance will decrease Outcome: Progressing   Problem: Elimination: Goal: Will not experience complications related to urinary retention Outcome: Progressing   Problem: Pain Managment: Goal: General experience of comfort will improve Outcome: Progressing   Problem: Safety: Goal: Ability to remain free from injury will improve Outcome: Progressing   

## 2022-06-15 NOTE — Procedures (Addendum)
Insertion of Chest Tube Procedure Note  REYNOL ARNONE  [id number]  06-17-1956  Date:06/15/22  Time:5:06 PM    Provider Performing: Charlott Holler   Procedure: Chest Tube Insertion [id number])  Indication(s) Effusion  Consent Risks of the procedure as well as the alternatives and risks of each were explained to the patient and/or caregiver.  Consent for the procedure was obtained and is signed in the bedside chart  Anesthesia Topical only with 1% lidocaine    Time Out Verified patient identification, verified procedure, site/side was marked, verified correct patient position, special equipment/implants available, medications/allergies/relevant history reviewed, required imaging and test results available.   Sterile Technique Maximal sterile technique including full sterile barrier drape, hand hygiene, sterile gown, sterile gloves, mask, hair covering, sterile ultrasound probe cover (if used).   Procedure Description Ultrasound used to identify appropriate pleural anatomy for placement and overlying skin marked. Area of placement cleaned and draped in sterile fashion.  A 14 French pigtail pleural catheter was attempted in the left pleural space. Was unable to aspirate back air or fluid despite attempting in three distinct thoracic spaces.   Complications/Tolerance Post-chest xray obtained - no pneumothorax.    EBL Minimal  Specimen(s) none  Discussed with IR. Plan will be for attempt with CT guided drain placement on 5/4. If unsuccessful will need to call [facility]. I am concerned he will need decortication.   Durel Salts, MD Pulmonary and Critical Care Medicine [facility] 06/15/2022 5:07 PM Pager: see AMION  If no response to pager, please call critical care on call (see AMION) until 7pm After 7:00 pm call Elink     Left pleural space

## 2022-06-16 ENCOUNTER — Inpatient Hospital Stay (HOSPITAL_COMMUNITY): Payer: Medicare PPO

## 2022-06-16 DIAGNOSIS — D72825 Bandemia: Secondary | ICD-10-CM | POA: Diagnosis not present

## 2022-06-16 DIAGNOSIS — N179 Acute kidney failure, unspecified: Secondary | ICD-10-CM | POA: Diagnosis not present

## 2022-06-16 DIAGNOSIS — J90 Pleural effusion, not elsewhere classified: Secondary | ICD-10-CM | POA: Diagnosis not present

## 2022-06-16 DIAGNOSIS — J189 Pneumonia, unspecified organism: Secondary | ICD-10-CM | POA: Diagnosis not present

## 2022-06-16 LAB — CBC WITH DIFFERENTIAL/PLATELET
Abs Immature Granulocytes: 0.12 10*3/uL — ABNORMAL HIGH (ref 0.00–0.07)
Basophils Absolute: 0.1 10*3/uL (ref 0.0–0.1)
Basophils Relative: 1 %
Eosinophils Absolute: 0 10*3/uL (ref 0.0–0.5)
Eosinophils Relative: 0 %
HCT: 26.4 % — ABNORMAL LOW (ref 39.0–52.0)
Hemoglobin: 8.6 g/dL — ABNORMAL LOW (ref 13.0–17.0)
Immature Granulocytes: 1 %
Lymphocytes Relative: 18 %
Lymphs Abs: 2.9 10*3/uL (ref 0.7–4.0)
MCH: 28.5 pg (ref 26.0–34.0)
MCHC: 32.6 g/dL (ref 30.0–36.0)
MCV: 87.4 fL (ref 80.0–100.0)
Monocytes Absolute: 1.5 10*3/uL — ABNORMAL HIGH (ref 0.1–1.0)
Monocytes Relative: 9 %
Neutro Abs: 11.9 10*3/uL — ABNORMAL HIGH (ref 1.7–7.7)
Neutrophils Relative %: 71 %
Platelets: 624 10*3/uL — ABNORMAL HIGH (ref 150–400)
RBC: 3.02 MIL/uL — ABNORMAL LOW (ref 4.22–5.81)
RDW: 18.1 % — ABNORMAL HIGH (ref 11.5–15.5)
WBC: 16.5 10*3/uL — ABNORMAL HIGH (ref 4.0–10.5)
nRBC: 0 % (ref 0.0–0.2)

## 2022-06-16 LAB — RENAL FUNCTION PANEL
Albumin: 1.8 g/dL — ABNORMAL LOW (ref 3.5–5.0)
Anion gap: 12 (ref 5–15)
BUN: 17 mg/dL (ref 8–23)
CO2: 21 mmol/L — ABNORMAL LOW (ref 22–32)
Calcium: 8.1 mg/dL — ABNORMAL LOW (ref 8.9–10.3)
Chloride: 104 mmol/L (ref 98–111)
Creatinine, Ser: 1.38 mg/dL — ABNORMAL HIGH (ref 0.61–1.24)
GFR, Estimated: 57 mL/min — ABNORMAL LOW (ref >60)
Glucose, Bld: 80 mg/dL (ref 70–99)
Phosphorus: 3.6 mg/dL (ref 2.5–4.6)
Potassium: 4.5 mmol/L (ref 3.5–5.1)
Sodium: 137 mmol/L (ref 135–145)

## 2022-06-16 LAB — PROTEIN, PLEURAL OR PERITONEAL FLUID: Total protein, fluid: 4 g/dL

## 2022-06-16 LAB — BODY FLUID CELL COUNT WITH DIFFERENTIAL
Eos, Fluid: 0 %
Lymphs, Fluid: 28 %
Monocyte-Macrophage-Serous Fluid: 1 % — ABNORMAL LOW (ref 50–90)
Neutrophil Count, Fluid: 71 % — ABNORMAL HIGH (ref 0–25)
Total Nucleated Cell Count, Fluid: 488 cu mm (ref 0–1000)

## 2022-06-16 LAB — CREATININE, SERUM
Creatinine, Ser: 1.4 mg/dL — ABNORMAL HIGH (ref 0.61–1.24)
GFR, Estimated: 56 mL/min — ABNORMAL LOW (ref >60)

## 2022-06-16 LAB — GLUCOSE, PLEURAL OR PERITONEAL FLUID: Glucose, Fluid: 39 mg/dL

## 2022-06-16 LAB — LACTATE DEHYDROGENASE, PLEURAL OR PERITONEAL FLUID: LD, Fluid: 577 U/L — ABNORMAL HIGH (ref 3–23)

## 2022-06-16 LAB — MAGNESIUM: Magnesium: 1.7 mg/dL (ref 1.7–2.4)

## 2022-06-16 MED ORDER — FENTANYL CITRATE (PF) 100 MCG/2ML IJ SOLN
INTRAMUSCULAR | Status: AC
  Filled 2022-06-16: qty 2

## 2022-06-16 MED ORDER — VANCOMYCIN HCL 1500 MG/300ML IV SOLN
1500.0000 mg | INTRAVENOUS | Status: DC
  Administered 2022-06-16 – 2022-06-17 (×2): 1500 mg via INTRAVENOUS
  Filled 2022-06-16 (×3): qty 300

## 2022-06-16 MED ORDER — AMLODIPINE BESYLATE 5 MG PO TABS
5.0000 mg | ORAL_TABLET | Freq: Every day | ORAL | Status: DC
  Administered 2022-06-16 – 2022-06-23 (×8): 5 mg via ORAL
  Filled 2022-06-16 (×8): qty 1

## 2022-06-16 MED ORDER — MIDAZOLAM HCL 2 MG/2ML IJ SOLN
INTRAMUSCULAR | Status: AC | PRN
  Administered 2022-06-16 (×2): 1 mg via INTRAVENOUS

## 2022-06-16 MED ORDER — FENTANYL CITRATE (PF) 100 MCG/2ML IJ SOLN
INTRAMUSCULAR | Status: AC | PRN
  Administered 2022-06-16 (×2): 50 ug via INTRAVENOUS

## 2022-06-16 MED ORDER — METRONIDAZOLE 500 MG/100ML IV SOLN
500.0000 mg | Freq: Two times a day (BID) | INTRAVENOUS | Status: DC
  Administered 2022-06-16 – 2022-06-19 (×8): 500 mg via INTRAVENOUS
  Filled 2022-06-16 (×9): qty 100

## 2022-06-16 MED ORDER — MIDAZOLAM HCL 2 MG/2ML IJ SOLN
INTRAMUSCULAR | Status: AC
  Filled 2022-06-16: qty 2

## 2022-06-16 NOTE — Progress Notes (Signed)
Orders for TED hose noted-pt states he does not want to wear them and states he has some at home and does not want to be charged for something he is not going to use/wear. Will d/c order for now.

## 2022-06-16 NOTE — Sedation Documentation (Signed)
Vital signs stable. 

## 2022-06-16 NOTE — Sedation Documentation (Signed)
Report given to floor RN sara

## 2022-06-16 NOTE — Progress Notes (Signed)
PROGRESS NOTE  David Callahan ITG:[id number] DOB: 17-Nov-1955   PCP: Patient, No Pcp Per  Patient is from: Home.  Lives alone.  Independently ambulates at baseline.  DOA: 06/14/2022 LOS: 2  Chief complaints Chief Complaint  Patient presents with   Weakness   Dizziness     Brief Narrative / Interim history: 66 year old M with PMH of COPD, HTN, tobacco use disorder, recurrent pleural effusion, recent hospitalization from 8/23-28 for CAP/exudative pleural effusion s/p thoracocentesis, and AKI returning to [facility] with increased shortness of breath, dizziness and low blood pressure, and admitted for recurrent exudative left pleural effusion.  CT chest showed moderate left loculated pleural effusion with gas.  He has WBC of 25 with left shift.  Creatinine 2.4 (1.3 on discharge recently).  Lactic acid 2.5.  Patient was started on vancomycin and cefepime.  Pulmonology consulted.  Patient was transferred to [facility] for further evaluation and management.  IR consulted and had CT-guided chest tube placement on 9/4 after unsuccessful attempt by pulmonology.  Subjective: Seen and examined earlier this morning.  No major events overnight of this morning.  No complaints other than left posterolateral chest pain.  Objective: Vitals:   06/16/22 1318 06/16/22 1322 06/16/22 1325 06/16/22 1330  BP: (!) 140/70 (!) 156/89 136/87 (!) 145/96  Pulse: 65 80 71 65  Resp: 12 15 16 16   Temp:      TempSrc:      SpO2: 97% 96% 96% 95%  Weight:      Height:        Examination:  GENERAL: No apparent distress.  Nontoxic. HEENT: MMM.  Vision and hearing grossly intact.  NECK: Supple.  No apparent JVD.  RESP:  No IWOB.  Diminished aeration over left lower lung CVS:  RRR. Heart sounds normal.  ABD/GI/GU: BS+. Abd soft, NTND.  MSK/EXT:  Moves extremities. No apparent deformity. No edema.  SKIN: no apparent skin lesion or wound NEURO: Awake and alert. Oriented appropriately.  No apparent focal  neuro deficit. PSYCH: Calm. Normal affect.   Procedures:  None  Microbiology summarized: COVID-19 PCR nonreactive. Full RVP nonreactive. Blood cultures NGTD. MRSA PCR screen positive.  Assessment and plan: Principal Problem:   Pleural effusion Active Problems:   COPD (chronic obstructive pulmonary disease) (HCC)   Leukocytosis   CAP (community acquired pneumonia)   AKI (acute kidney injury) (HCC)  Community-acquired pneumonia with recurrent pleural effusion-POA.  Hospitalized 8/23-28 and had thoracocentesis with removal of exudative and cultures negative fluid.  He was discharged on Levaquin.  Returns with shortness of breath, hypotension and near syncope.  Had leukocytosis to 25 with left shift.  Lactic acidosis of 2.5.  CT chest as above.  MRSA PCR screen positive.  RVP and COVID-19 PCR negative.  Blood cultures NGTD. -CT-guided chest tube by IR on 9/4.  Follow fluid analysis -Continue vancomycin and cefepime -Added IV Flagyl for anaerobic coverage -Fibrinolysis per pulmonology -Follow QuantiFERON gold  Chronic COPD/tobacco abuse- -Continue bronchodilators and nicotine patch -Encouraged smoking cessation.   Hypotension/history of hypertension: Hypotension resolved.   -Continue holding home benazepril in the setting of AKI and Cardizem in the setting of borderline heart rate -Start low-dose amlodipine  AKI: Likely prerenal in the setting of sepsis and hypotension.  Resolving. Recent Labs    06/04/22 1915 06/05/22 0503 06/06/22 1200 06/07/22 0419 06/14/22 0830 06/15/22 0854 06/16/22 0153  BUN 38* 35* 30* 27* 30* 18 17  CREATININE 2.05* 1.72* 1.31* 1.27* 2.40* 1.55* 1.38*  1.40*  -Continue  monitoring -Avoid nephrotoxic meds  Anemia of chronic disease with iron deficiency: Drop in Hgb likely dilutional from IVF.  Iron saturation 7%. Recent Labs    03/25/22 1006 06/04/22 1915 06/05/22 0503 06/06/22 1200 06/07/22 0419 06/14/22 0830 06/15/22 0854 06/16/22 0153   HGB 11.6* 11.6* 10.5* 9.9* 10.0* 9.5* 9.1* 8.6*  -Monitor H&H -Check ferritin level in the morning  Thrombocytosis: Likely reactive. -Monitor.  Bandemia: Likely due to #1.  Improving. -Continue monitoring  Lactic acidosis: Likely due to #1.  Resolved.  Restless leg syndrome -continue Requip  Sepsis ruled out.    Body mass index is 28.05 kg/m.           DVT prophylaxis:  heparin injection 5,000 Units Start: 06/14/22 2200 SCDs Start: 06/14/22 2116  Code Status: Full code Family Communication: None at bedside Level of care: Telemetry Medical Status is: Inpatient Remains inpatient appropriate because: Community-acquired pneumonia with recurrent exudative pleural effusion   Final disposition: TBD Consultants:  Pulmonology Interventional radiology  Sch Meds:  Scheduled Meds:  amLODipine  5 mg Oral Daily   atorvastatin  40 mg Oral Daily   Chlorhexidine Gluconate Cloth  6 each Topical Q0600   cyclobenzaprine  10 mg Oral QHS   dextromethorphan-guaiFENesin  1 tablet Oral BID   fentaNYL       fluticasone furoate-vilanterol  1 puff Inhalation Daily   And   umeclidinium bromide  1 puff Inhalation Daily   heparin  5,000 Units Subcutaneous Q8H   multivitamin with minerals  1 tablet Oral Daily   mupirocin ointment  1 Application Nasal BID   nicotine  21 mg Transdermal Daily   rOPINIRole  0.75 mg Oral QHS   sodium chloride flush  3 mL Intravenous Q12H   traZODone  50 mg Oral QHS   Continuous Infusions:  sodium chloride 125 mL/hr at 06/16/22 0646   ceFEPime (MAXIPIME) IV 2 g (06/16/22 1104)   metronidazole 500 mg (06/16/22 0846)   vancomycin 1,500 mg (06/16/22 1111)   PRN Meds:.acetaminophen **OR** acetaminophen, albuterol, bisacodyl, fentaNYL, HYDROcodone-acetaminophen, ondansetron **OR** ondansetron (ZOFRAN) IV, polyethylene glycol, traZODone  Antimicrobials: Anti-infectives (From admission, onward)    Start     Dose/Rate Route Frequency Ordered Stop    06/16/22 1100  vancomycin (VANCOREADY) IVPB 1500 mg/300 mL        1,500 mg 150 mL/hr over 120 Minutes Intravenous Every 24 hours 06/16/22 0814     06/16/22 0845  metroNIDAZOLE (FLAGYL) IVPB 500 mg        500 mg 100 mL/hr over 60 Minutes Intravenous Every 12 hours 06/16/22 0745 06/26/22 0800   06/15/22 1345  ceFAZolin (ANCEF) IVPB 2g/100 mL premix        2 g 200 mL/hr over 30 Minutes Intravenous To Radiology 06/15/22 1248 06/16/22 1345   06/15/22 1100  vancomycin (VANCOCIN) IVPB 1000 mg/200 mL premix  Status:  Discontinued        1,000 mg 200 mL/hr over 60 Minutes Intravenous Every 24 hours 06/14/22 1011 06/16/22 0814   06/14/22 2200  ceFEPIme (MAXIPIME) 2 g in sodium chloride 0.9 % 100 mL IVPB        2 g 200 mL/hr over 30 Minutes Intravenous Every 12 hours 06/14/22 0943     06/14/22 0945  vancomycin (VANCOREADY) IVPB 2000 mg/400 mL        2,000 mg 200 mL/hr over 120 Minutes Intravenous  Once 06/14/22 0934 06/14/22 1212   06/14/22 0930  vancomycin (VANCOCIN) IVPB 1000 mg/200 mL premix  Status:  Discontinued        1,000 mg 200 mL/hr over 60 Minutes Intravenous  Once 06/14/22 0925 06/14/22 0934   06/14/22 0930  ceFEPIme (MAXIPIME) 2 g in sodium chloride 0.9 % 100 mL IVPB        2 g 200 mL/hr over 30 Minutes Intravenous  Once 06/14/22 0925 06/14/22 1017        I have personally reviewed the following labs and images: CBC: Recent Labs  Lab 06/14/22 0830 06/15/22 0854 06/16/22 0153  WBC 24.8* 16.8* 16.5*  NEUTROABS 20.1* 12.3* 11.9*  HGB 9.5* 9.1* 8.6*  HCT 29.6* 27.2* 26.4*  MCV 89.4 87.2 87.4  PLT 670* 630* 624*   BMP &GFR Recent Labs  Lab 06/14/22 0830 06/15/22 0854 06/16/22 0153  NA 136 135 137  K 3.6 4.3 4.5  CL 99 104 104  CO2 25 22 21*  GLUCOSE 118* 89 80  BUN 30* 18 17  CREATININE 2.40* 1.55* 1.38*  1.40*  CALCIUM 8.2* 8.2* 8.1*  MG  --   --  1.7  PHOS  --   --  3.6   Estimated Creatinine Clearance: 59 mL/min (A) (by C-G formula based on SCr of 1.4  mg/dL (H)). Liver & Pancreas: Recent Labs  Lab 06/14/22 0830 06/15/22 0854 06/16/22 0153  AST 25 35  --   ALT 28 24  --   ALKPHOS 85 79  --   BILITOT 1.0 0.9  --   PROT 6.5 5.5*  --   ALBUMIN 2.2* 1.8* 1.8*   Recent Labs  Lab 06/14/22 0830  LIPASE 22   No results for input(s): "AMMONIA" in the last 168 hours. Diabetic: No results for input(s): "HGBA1C" in the last 72 hours. Recent Labs  Lab 06/14/22 0817  GLUCAP 131*   Cardiac Enzymes: No results for input(s): "CKTOTAL", "CKMB", "CKMBINDEX", "TROPONINI" in the last 168 hours. No results for input(s): "PROBNP" in the last 8760 hours. Coagulation Profile: No results for input(s): "INR", "PROTIME" in the last 168 hours. Thyroid Function Tests: No results for input(s): "TSH", "T4TOTAL", "FREET4", "T3FREE", "THYROIDAB" in the last 72 hours. Lipid Profile: No results for input(s): "CHOL", "HDL", "LDLCALC", "TRIG", "CHOLHDL", "LDLDIRECT" in the last 72 hours. Anemia Panel: Recent Labs    06/14/22 2052  TIBC 164*  IRON 12*   Urine analysis:    Component Value Date/Time   COLORURINE YELLOW 06/14/2022 1210   APPEARANCEUR CLEAR 06/14/2022 1210   LABSPEC 1.010 06/14/2022 1210   PHURINE 6.0 06/14/2022 1210   GLUCOSEU NEGATIVE 06/14/2022 1210   HGBUR SMALL (A) 06/14/2022 1210   BILIRUBINUR NEGATIVE 06/14/2022 1210   KETONESUR NEGATIVE 06/14/2022 1210   PROTEINUR NEGATIVE 06/14/2022 1210   NITRITE NEGATIVE 06/14/2022 1210   LEUKOCYTESUR NEGATIVE 06/14/2022 1210   Sepsis Labs: Invalid input(s): "PROCALCITONIN", "LACTICIDVEN"  Microbiology: Recent Results (from the past 240 hour(s))  Culture, blood (Routine X 2) w Reflex to ID Panel     Status: None (Preliminary result)   Collection Time: 06/14/22  8:50 AM   Specimen: Left Antecubital; Blood  Result Value Ref Range Status   Specimen Description   Final    LEFT ANTECUBITAL BOTTLES DRAWN AEROBIC AND ANAEROBIC   Special Requests Blood Culture adequate volume  Final    Culture   Final    NO GROWTH < 24 HOURS Performed at [facility], [address] [place], [place] [address]    Report Status PENDING  Incomplete  SARS Coronavirus 2 by RT PCR (hospital order, performed in  [facility] hospital lab) *cepheid single result test* Anterior Nasal Swab     Status: None   Collection Time: 06/14/22  8:58 AM   Specimen: Anterior Nasal Swab  Result Value Ref Range Status   SARS Coronavirus 2 by RT PCR NEGATIVE NEGATIVE Final    Comment: (NOTE) SARS-CoV-2 target nucleic acids are NOT DETECTED.  The SARS-CoV-2 RNA is generally detectable in upper and lower respiratory specimens during the acute phase of infection. The lowest concentration of SARS-CoV-2 viral copies this assay can detect is 250 copies / mL. A negative result does not preclude SARS-CoV-2 infection and should not be used as the sole basis for treatment or other patient management decisions.  A negative result may occur with improper specimen collection / handling, submission of specimen other than nasopharyngeal swab, presence of viral mutation(s) within the areas targeted by this assay, and inadequate number of viral copies (<250 copies / mL). A negative result must be combined with clinical observations, patient history, and epidemiological information.  Fact Sheet for Patients:   RoadLapTop.co.za  Fact Sheet for Healthcare Providers: http://kim-miller.com/  This test is not yet approved or  cleared by the [place] FDA and has been authorized for detection and/or diagnosis of SARS-CoV-2 by FDA under an Emergency Use Authorization (EUA).  This EUA will remain in effect (meaning this test can be used) for the duration of the COVID-19 declaration under Section 564(b)(1) of the Act, 21 U.S.C. section 360bbb-3(b)(1), unless the authorization is terminated or revoked sooner.  Performed at [facility], [address], [place], [place] [address]    Culture, blood (Routine X 2) w Reflex to ID Panel     Status: None (Preliminary result)   Collection Time: 06/14/22  9:09 AM   Specimen: BLOOD RIGHT HAND  Result Value Ref Range Status   Specimen Description   Final    BLOOD RIGHT HAND BOTTLES DRAWN AEROBIC AND ANAEROBIC   Special Requests   Final    Blood Culture results may not be optimal due to an excessive volume of blood received in culture bottles   Culture   Final    NO GROWTH < 24 HOURS Performed at [facility], [address] [place], [place] [address]    Report Status PENDING  Incomplete  MRSA Next Gen by PCR, Nasal     Status: Abnormal   Collection Time: 06/14/22  9:45 AM   Specimen: Nasal Mucosa; Nasal Swab  Result Value Ref Range Status   MRSA by PCR Next Gen DETECTED (A) NOT DETECTED Final    Comment: RESULT CALLED TO, READ BACK BY AND VERIFIED WITH: EANES M @ 1138 ON [id number] BY HENDERSON L (NOTE) The GeneXpert MRSA Assay (FDA approved for NASAL specimens only), is one component of a comprehensive MRSA colonization surveillance program. It is not intended to diagnose MRSA infection nor to guide or monitor treatment for MRSA infections. Test performance is not FDA approved in patients less than 40 years old. Performed at [facility], [address], [place], [place] [address]   Respiratory (~20 pathogens) panel by PCR     Status: None   Collection Time: 06/14/22  9:30 PM   Specimen: Nasopharyngeal Swab; Respiratory  Result Value Ref Range Status   Adenovirus NOT DETECTED NOT DETECTED Final   Coronavirus 229E NOT DETECTED NOT DETECTED Final    Comment: (NOTE) The Coronavirus on the Respiratory Panel, DOES NOT test for the novel  Coronavirus (2019 nCoV)    Coronavirus HKU1 NOT DETECTED  NOT DETECTED Final   Coronavirus NL63 NOT DETECTED NOT DETECTED Final   Coronavirus OC43 NOT DETECTED NOT DETECTED Final   Metapneumovirus NOT DETECTED NOT DETECTED Final   Rhinovirus / Enterovirus NOT DETECTED NOT DETECTED Final    Influenza A NOT DETECTED NOT DETECTED Final   Influenza B NOT DETECTED NOT DETECTED Final   Parainfluenza Virus 1 NOT DETECTED NOT DETECTED Final   Parainfluenza Virus 2 NOT DETECTED NOT DETECTED Final   Parainfluenza Virus 3 NOT DETECTED NOT DETECTED Final   Parainfluenza Virus 4 NOT DETECTED NOT DETECTED Final   Respiratory Syncytial Virus NOT DETECTED NOT DETECTED Final   Bordetella pertussis NOT DETECTED NOT DETECTED Final   Bordetella Parapertussis NOT DETECTED NOT DETECTED Final   Chlamydophila pneumoniae NOT DETECTED NOT DETECTED Final   Mycoplasma pneumoniae NOT DETECTED NOT DETECTED Final    Comment: Performed at [facility], [address], [place], [place] [address]    Radiology Studies: DG Chest Port 1 View  Result Date: 06/15/2022 CLINICAL DATA:  Left pleural effusion.  Attempted thoracentesis. EXAM: PORTABLE CHEST 1 VIEW COMPARISON:  06/14/2022 FINDINGS: Stable loculated left pleural effusion. Left base collapse/consolidation similar to prior. No evidence for pneumothorax. Right lung clear. The cardiopericardial silhouette is within normal limits for size. Telemetry leads overlie the chest. IMPRESSION: No evidence for pneumothorax status post attempted left thoracentesis. Electronically Signed   By: Kennith Center M.D.   On: 06/15/2022 16:46      Jarika Robben T. Ebbie Cherry [facility]  If 7PM-7AM, please contact night-coverage [email or web address] 06/16/2022, 1:54 PM

## 2022-06-16 NOTE — Procedures (Signed)
Vascular and Interventional Radiology Procedure Note  Patient: David Callahan DOB: 1956/09/03 Medical Record Number: [id number] Note Date/Time: 06/16/22 1:39 PM   Performing Physician: Roanna Banning, MD Assistant(s): None  Diagnosis: Loculated L pleural effusion  Procedure:  LEFT THORACOSTOMY DRAINAGE CATHETER PLACEMENT for fibrinolysis  Anesthesia: Conscious Sedation Complications: None Estimated Blood Loss: Minimal Specimens: Sent for Gram Stain, Aerobe Culture, and Anerobe Culture  Findings:  Successful CT-guided placement of 14 F catheter into LEFT chest.  Plan:  - Fibrinolysis per Pulmonary team. - L chest tube to -20 cm H20 suction. Additional management per Primary.  See detailed procedure note with images in PACS. The patient tolerated the procedure well without incident or complication and was returned to Floor Bed in stable condition.    Roanna Banning, MD Vascular and Interventional Radiology Specialists [facility] Radiology   Pager. [phone] Clinic. [phone]

## 2022-06-16 NOTE — Progress Notes (Signed)
Mobility Specialist Progress Note:   06/16/22 0950  Mobility  Activity Ambulated with assistance in hallway  Level of Assistance Standby assist, set-up cues, supervision of patient - no hands on  Assistive Device  (Iv pole)  Distance Ambulated (ft) 100 ft  Activity Response Tolerated well  $Mobility charge 1 Mobility   Pt received in bed willing to participate in mobility. Complaints of L side pain d/t failed chest tube insertions. Left in bed with call bell in reach and all needs met.   [facility] Samanatha Brammer Mobility Specialist

## 2022-06-16 NOTE — Progress Notes (Signed)
Pharmacy Antibiotic Note-Follow Up  David Callahan is a 66 y.o. male admitted on 06/14/2022 with pneumonia.  Pharmacy has been consulted for vancomycin and cefepime dosing. Patient's Cr trending down closer to baseline. Cr 1.4 (BL~1.1). Will adjust vancomycin dosing to reflect CrCl.  Plan: Vancomycin 1500 mg IV every 24 hours. (eAUC 474). Used IBW and Vd 0.72, Cr 1.4 Cefepime 2000 mg IV every 12 hours. Monitor labs, c/s, and vanco level as indicated.  Height: 5\' 10"  (177.8 cm) Weight: 88.7 kg (195 lb 8 oz) IBW/kg (Calculated) : 73  Temp (24hrs), Avg:99 F (37.2 C), Min:98.6 F (37 C), Max:99.4 F (37.4 C)  Recent Labs  Lab 06/14/22 0830 06/14/22 0850 06/14/22 1059 06/14/22 2052 06/15/22 0854 06/16/22 0153  WBC 24.8*  --   --   --  16.8* 16.5*  CREATININE 2.40*  --   --   --  1.55* 1.38*  1.40*  LATICACIDVEN  --  2.5* 2.0* 1.4  --   --      Estimated Creatinine Clearance: 59 mL/min (A) (by C-G formula based on SCr of 1.4 mg/dL (H)).    Allergies  Allergen Reactions   Motrin [Ibuprofen] Swelling    Anti-inflammatory analgesics- cause anaphylaxis    Antimicrobials this admission: Vanco 9/2 >> Cefepime 9/2 >>  Microbiology results: 9/2 BCx: NG<24h 9/2 MRSA PCR: + 9/2 Sputum Cx: pending Covid and Resp Viral panel: negative  Thank you for allowing pharmacy to be a part of this patient's care.  11/2, PharmD. [facility] Acute Care PGY-1  06/16/2022 8:11 AM

## 2022-06-17 ENCOUNTER — Inpatient Hospital Stay (HOSPITAL_COMMUNITY): Payer: Medicare PPO

## 2022-06-17 DIAGNOSIS — N179 Acute kidney failure, unspecified: Secondary | ICD-10-CM | POA: Diagnosis not present

## 2022-06-17 DIAGNOSIS — J869 Pyothorax without fistula: Secondary | ICD-10-CM

## 2022-06-17 DIAGNOSIS — J189 Pneumonia, unspecified organism: Secondary | ICD-10-CM | POA: Diagnosis not present

## 2022-06-17 DIAGNOSIS — D72825 Bandemia: Secondary | ICD-10-CM | POA: Diagnosis not present

## 2022-06-17 DIAGNOSIS — J90 Pleural effusion, not elsewhere classified: Secondary | ICD-10-CM | POA: Diagnosis not present

## 2022-06-17 LAB — CBC WITH DIFFERENTIAL/PLATELET
Abs Immature Granulocytes: 0.07 10*3/uL (ref 0.00–0.07)
Basophils Absolute: 0.1 10*3/uL (ref 0.0–0.1)
Basophils Relative: 0 %
Eosinophils Absolute: 0.1 10*3/uL (ref 0.0–0.5)
Eosinophils Relative: 0 %
HCT: 26.6 % — ABNORMAL LOW (ref 39.0–52.0)
Hemoglobin: 8.7 g/dL — ABNORMAL LOW (ref 13.0–17.0)
Immature Granulocytes: 1 %
Lymphocytes Relative: 21 %
Lymphs Abs: 2.9 10*3/uL (ref 0.7–4.0)
MCH: 28.4 pg (ref 26.0–34.0)
MCHC: 32.7 g/dL (ref 30.0–36.0)
MCV: 86.9 fL (ref 80.0–100.0)
Monocytes Absolute: 1.2 10*3/uL — ABNORMAL HIGH (ref 0.1–1.0)
Monocytes Relative: 9 %
Neutro Abs: 9.6 10*3/uL — ABNORMAL HIGH (ref 1.7–7.7)
Neutrophils Relative %: 69 %
Platelets: 551 10*3/uL — ABNORMAL HIGH (ref 150–400)
RBC: 3.06 MIL/uL — ABNORMAL LOW (ref 4.22–5.81)
RDW: 18 % — ABNORMAL HIGH (ref 11.5–15.5)
WBC: 14 10*3/uL — ABNORMAL HIGH (ref 4.0–10.5)
nRBC: 0 % (ref 0.0–0.2)

## 2022-06-17 LAB — RENAL FUNCTION PANEL
Albumin: 1.7 g/dL — ABNORMAL LOW (ref 3.5–5.0)
Anion gap: 11 (ref 5–15)
BUN: 17 mg/dL (ref 8–23)
CO2: 25 mmol/L (ref 22–32)
Calcium: 8.2 mg/dL — ABNORMAL LOW (ref 8.9–10.3)
Chloride: 101 mmol/L (ref 98–111)
Creatinine, Ser: 1.29 mg/dL — ABNORMAL HIGH (ref 0.61–1.24)
GFR, Estimated: >60 mL/min (ref >60)
Glucose, Bld: 89 mg/dL (ref 70–99)
Phosphorus: 2.9 mg/dL (ref 2.5–4.6)
Potassium: 4.2 mmol/L (ref 3.5–5.1)
Sodium: 137 mmol/L (ref 135–145)

## 2022-06-17 LAB — MAGNESIUM: Magnesium: 1.7 mg/dL (ref 1.7–2.4)

## 2022-06-17 LAB — LEGIONELLA PNEUMOPHILA SEROGP 1 UR AG: L. pneumophila Serogp 1 Ur Ag: NEGATIVE

## 2022-06-17 LAB — FERRITIN: Ferritin: 233 ng/mL (ref 24–336)

## 2022-06-17 MED ORDER — STERILE WATER FOR INJECTION IJ SOLN
5.0000 mg | Freq: Once | RESPIRATORY_TRACT | Status: DC
  Filled 2022-06-17: qty 5

## 2022-06-17 MED ORDER — SODIUM CHLORIDE 0.9% FLUSH
10.0000 mL | Freq: Three times a day (TID) | INTRAVENOUS | Status: DC
  Administered 2022-06-17 – 2022-06-23 (×19): 10 mL via INTRAPLEURAL

## 2022-06-17 MED ORDER — SODIUM CHLORIDE (PF) 0.9 % IJ SOLN
10.0000 mg | Freq: Once | INTRAMUSCULAR | Status: DC
  Filled 2022-06-17: qty 10

## 2022-06-17 NOTE — Progress Notes (Signed)
Unclamped the pleural catheter at 0950.  Hinton Dyer, RN

## 2022-06-17 NOTE — Progress Notes (Signed)
Informed by CCMD that Pt. Had a run of Bigeminy PVC's. Pt was asleep and asymptomatic when I checked on him. Strip saved.

## 2022-06-17 NOTE — Progress Notes (Signed)
PROGRESS NOTE  David Callahan [id number] DOB: 1956-08-27   PCP: Patient, No Pcp Per  Patient is from: Home.  Lives alone.  Independently ambulates at baseline.  DOA: 06/14/2022 LOS: 3  Chief complaints Chief Complaint  Patient presents with   Weakness   Dizziness     Brief Narrative / Interim history: 66 year old M with PMH of COPD, HTN, tobacco use disorder, recurrent pleural effusion, recent hospitalization from 8/23-28 for CAP/exudative pleural effusion s/p thoracocentesis, and AKI returning to [facility] with increased shortness of breath, dizziness and low blood pressure, and admitted for recurrent exudative left pleural effusion.  CT chest showed moderate left loculated pleural effusion with gas.  He has WBC of 25 with left shift.  Creatinine 2.4 (1.3 on discharge recently).  Lactic acid 2.5.  Patient was started on vancomycin and cefepime.  Pulmonology consulted.  Patient was transferred to [facility] for further evaluation and management.  IR consulted and had CT-guided chest tube placed on 9/4 with removal of 700 cc exudative and cultures negative fluid.  Intrapleural lytics administered on 9/5.  IR and pulmonology following.  Subjective: Seen and examined earlier this morning.  No major events overnight of this morning.  No complaints.  Objective: Vitals:   06/16/22 2008 06/17/22 0746 06/17/22 0836 06/17/22 1104  BP: 138/75  124/72 121/75  Pulse: 67  74 94  Resp: 16  17   Temp: 98.3 F (36.8 C)  98.3 F (36.8 C)   TempSrc: Oral  Oral   SpO2: 96% 100% 99% 97%  Weight:      Height:        Examination:  GENERAL: No apparent distress.  Nontoxic. HEENT: MMM.  Vision and hearing grossly intact.  NECK: Supple.  No apparent JVD.  RESP:  No IWOB.  Diminished aeration over LLL. CVS:  RRR. Heart sounds normal.  ABD/GI/GU: BS+. Abd soft, NTND.  MSK/EXT:  Moves extremities. No apparent deformity. No edema.  SKIN: no apparent skin lesion or wound NEURO: Awake  and alert. Oriented appropriately.  No apparent focal neuro deficit. PSYCH: Calm. Normal affect.   Procedures:  9/4-image guided left chest tube placement  Microbiology summarized: COVID-19 PCR nonreactive. Full RVP nonreactive. Blood cultures NGTD. MRSA PCR screen positive. Pleural fluid culture NGTD.  Assessment and plan: Principal Problem:   Pleural effusion Active Problems:   COPD (chronic obstructive pulmonary disease) (HCC)   Leukocytosis   CAP (community acquired pneumonia)   AKI (acute kidney injury) ([place])  Community-acquired pneumonia with recurrent pleural effusion concerning for parapneumonic effusion-POA.  Hospitalized 8/23-28 and had thoracocentesis with removal of exudative and cultures negative fluid.  He was discharged on Levaquin.  Returns with shortness of breath, hypotension and near syncope.  Had leukocytosis to 25 with left shift.  Lactic acidosis of 2.5.  CT chest as above.  MRSA PCR screen positive.  RVP and COVID-19 PCR negative.  Blood cultures NGTD.  Leukocytosis improved. -CT-guided chest tube by IR on 9/4 with removal of 700 cc exudative fluid.  Fluid culture NGTD. -Continue vancomycin, cefepime and Flagyl -Follow-up fluid cytology, urine Legionella and QuantiFERON gold -Lasix per pulmonology -Pulmonary toilet  Chronic COPD/tobacco abuse- -Continue bronchodilators and nicotine patch -Encouraged smoking cessation.   Hypotension/history of hypertension: Hypotension resolved.   -Continue holding home benazepril in the setting of AKI and Cardizem in the setting of borderline heart rate -Start low-dose amlodipine  AKI: Likely prerenal in the setting of sepsis and hypotension.  Resolved. Recent Labs  06/04/22 1915 06/05/22 0503 06/06/22 1200 06/07/22 0419 06/14/22 0830 06/15/22 0621 06/15/22 0854 06/16/22 0153 06/17/22 0349  BUN 38* 35* 30* 27* 30* 18 18 17 17   CREATININE 2.05* 1.72* 1.31* 1.27* 2.40* 1.65* 1.55* 1.38*  1.40* 1.29*   -Continue monitoring -Avoid nephrotoxic meds  Anemia of chronic disease with iron deficiency: Drop in Hgb likely dilutional from IVF.  Iron saturation 7%. Recent Labs    03/25/22 1006 06/04/22 1915 06/05/22 0503 06/06/22 1200 06/07/22 0419 06/14/22 0830 06/15/22 0621 06/15/22 0854 06/16/22 0153 06/17/22 0349  HGB 11.6* 11.6* 10.5* 9.9* 10.0* 9.5* 8.7* 9.1* 8.6* 8.7*  -Monitor H&H  Thrombocytosis: Likely reactive. -Monitor.  Bandemia: Likely due to #1.  Improving. -Continue monitoring  Lactic acidosis: Likely due to #1.  Resolved.  Restless leg syndrome -continue Requip  Sepsis ruled out.   Body mass index is 28.05 kg/m.           DVT prophylaxis:  heparin injection 5,000 Units Start: 06/14/22 2200 SCDs Start: 06/14/22 2116  Code Status: Full code Family Communication: None at bedside Level of care: Telemetry Medical Status is: Inpatient Remains inpatient appropriate because: Parapneumonic effusion   Final disposition: Likely home once medically cleared Consultants:  Pulmonology Interventional radiology  Sch Meds:  Scheduled Meds:  alteplase (CATHFLO ACTIVASE) 10 mg in sodium chloride (PF) 0.9 % 30 mL  10 mg Intrapleural Once   And   dornase alfa (PULMOZYME) 5 mg in sterile water (preservative free) 30 mL  5 mg Intrapleural Once   amLODipine  5 mg Oral Daily   atorvastatin  40 mg Oral Daily   Chlorhexidine Gluconate Cloth  6 each Topical Q0600   cyclobenzaprine  10 mg Oral QHS   dextromethorphan-guaiFENesin  1 tablet Oral BID   fluticasone furoate-vilanterol  1 puff Inhalation Daily   And   umeclidinium bromide  1 puff Inhalation Daily   heparin  5,000 Units Subcutaneous Q8H   multivitamin with minerals  1 tablet Oral Daily   mupirocin ointment  1 Application Nasal BID   nicotine  21 mg Transdermal Daily   rOPINIRole  0.75 mg Oral QHS   sodium chloride flush  10 mL Intrapleural Q8H   sodium chloride flush  3 mL Intravenous Q12H   traZODone   50 mg Oral QHS   Continuous Infusions:  sodium chloride 125 mL/hr at 06/17/22 0441   ceFEPime (MAXIPIME) IV 2 g (06/17/22 1100)   metronidazole 500 mg (06/17/22 0814)   vancomycin 1,500 mg (06/17/22 1108)   PRN Meds:.acetaminophen **OR** acetaminophen, albuterol, bisacodyl, HYDROcodone-acetaminophen, ondansetron **OR** ondansetron (ZOFRAN) IV, polyethylene glycol, traZODone  Antimicrobials: Anti-infectives (From admission, onward)    Start     Dose/Rate Route Frequency Ordered Stop   06/16/22 1100  vancomycin (VANCOREADY) IVPB 1500 mg/300 mL        1,500 mg 150 mL/hr over 120 Minutes Intravenous Every 24 hours 06/16/22 0814     06/16/22 0845  metroNIDAZOLE (FLAGYL) IVPB 500 mg        500 mg 100 mL/hr over 60 Minutes Intravenous Every 12 hours 06/16/22 0745 06/26/22 0800   06/15/22 1345  ceFAZolin (ANCEF) IVPB 2g/100 mL premix        2 g 200 mL/hr over 30 Minutes Intravenous To Radiology 06/15/22 1248 06/16/22 1345   06/15/22 1100  vancomycin (VANCOCIN) IVPB 1000 mg/200 mL premix  Status:  Discontinued        1,000 mg 200 mL/hr over 60 Minutes Intravenous Every 24 hours 06/14/22 1011 06/16/22 0814  06/14/22 2200  ceFEPIme (MAXIPIME) 2 g in sodium chloride 0.9 % 100 mL IVPB        2 g 200 mL/hr over 30 Minutes Intravenous Every 12 hours 06/14/22 0943     06/14/22 0945  vancomycin (VANCOREADY) IVPB 2000 mg/400 mL        2,000 mg 200 mL/hr over 120 Minutes Intravenous  Once 06/14/22 0934 06/14/22 1212   06/14/22 0930  vancomycin (VANCOCIN) IVPB 1000 mg/200 mL premix  Status:  Discontinued        1,000 mg 200 mL/hr over 60 Minutes Intravenous  Once 06/14/22 0925 06/14/22 0934   06/14/22 0930  ceFEPIme (MAXIPIME) 2 g in sodium chloride 0.9 % 100 mL IVPB        2 g 200 mL/hr over 30 Minutes Intravenous  Once 06/14/22 0925 06/14/22 1017        I have personally reviewed the following labs and images: CBC: Recent Labs  Lab 06/14/22 0830 06/15/22 0621 06/15/22 0854  06/16/22 0153 06/17/22 0349  WBC 24.8* 18.2* 16.8* 16.5* 14.0*  NEUTROABS 20.1*  --  12.3* 11.9* 9.6*  HGB 9.5* 8.7* 9.1* 8.6* 8.7*  HCT 29.6* 26.9* 27.2* 26.4* 26.6*  MCV 89.4 89.1 87.2 87.4 86.9  PLT 670* 652* 630* 624* 551*   BMP &GFR Recent Labs  Lab 06/14/22 0830 06/15/22 0621 06/15/22 0854 06/16/22 0153 06/17/22 0349  NA 136 136 135 137 137  K 3.6 4.2 4.3 4.5 4.2  CL 99 103 104 104 101  CO2 25 24 22  21* 25  GLUCOSE 118* 83 89 80 89  BUN 30* 18 18 17 17   CREATININE 2.40* 1.65* 1.55* 1.38*  1.40* 1.29*  CALCIUM 8.2* 8.1* 8.2* 8.1* 8.2*  MG  --   --   --  1.7 1.7  PHOS  --   --   --  3.6 2.9   Estimated Creatinine Clearance: 64 mL/min (A) (by C-G formula based on SCr of 1.29 mg/dL (H)). Liver & Pancreas: Recent Labs  Lab 06/14/22 0830 06/15/22 0621 06/15/22 0854 06/16/22 0153 06/17/22 0349  AST 25 28 35  --   --   ALT 28 28 24   --   --   ALKPHOS 85 70 79  --   --   BILITOT 1.0 0.6 0.9  --   --   PROT 6.5 5.5* 5.5*  --   --   ALBUMIN 2.2* 1.7* 1.8* 1.8* 1.7*   Recent Labs  Lab 06/14/22 0830  LIPASE 22   No results for input(s): "AMMONIA" in the last 168 hours. Diabetic: No results for input(s): "HGBA1C" in the last 72 hours. Recent Labs  Lab 06/14/22 0817  GLUCAP 131*   Cardiac Enzymes: No results for input(s): "CKTOTAL", "CKMB", "CKMBINDEX", "TROPONINI" in the last 168 hours. No results for input(s): "PROBNP" in the last 8760 hours. Coagulation Profile: No results for input(s): "INR", "PROTIME" in the last 168 hours. Thyroid Function Tests: No results for input(s): "TSH", "T4TOTAL", "FREET4", "T3FREE", "THYROIDAB" in the last 72 hours. Lipid Profile: No results for input(s): "CHOL", "HDL", "LDLCALC", "TRIG", "CHOLHDL", "LDLDIRECT" in the last 72 hours. Anemia Panel: Recent Labs    06/14/22 2052 06/17/22 0349  FERRITIN  --  233  TIBC 164*  --   IRON 12*  --    Urine analysis:    Component Value Date/Time   COLORURINE YELLOW 06/14/2022  1210   APPEARANCEUR CLEAR 06/14/2022 1210   LABSPEC 1.010 06/14/2022 1210   PHURINE 6.0 06/14/2022 1210  GLUCOSEU NEGATIVE 06/14/2022 1210   HGBUR SMALL (A) 06/14/2022 1210   BILIRUBINUR NEGATIVE 06/14/2022 1210   KETONESUR NEGATIVE 06/14/2022 1210   PROTEINUR NEGATIVE 06/14/2022 1210   NITRITE NEGATIVE 06/14/2022 1210   LEUKOCYTESUR NEGATIVE 06/14/2022 1210   Sepsis Labs: Invalid input(s): "PROCALCITONIN", "LACTICIDVEN"  Microbiology: Recent Results (from the past 240 hour(s))  Culture, blood (Routine X 2) w Reflex to ID Panel     Status: None (Preliminary result)   Collection Time: 06/14/22  8:50 AM   Specimen: Left Antecubital; Blood  Result Value Ref Range Status   Specimen Description   Final    LEFT ANTECUBITAL BOTTLES DRAWN AEROBIC AND ANAEROBIC   Special Requests Blood Culture adequate volume  Final   Culture   Final    NO GROWTH 3 DAYS Performed at [facility], [address], [place], [place] [address]    Report Status PENDING  Incomplete  SARS Coronavirus 2 by RT PCR (hospital order, performed in [facility] hospital lab) *cepheid single result test* Anterior Nasal Swab     Status: None   Collection Time: 06/14/22  8:58 AM   Specimen: Anterior Nasal Swab  Result Value Ref Range Status   SARS Coronavirus 2 by RT PCR NEGATIVE NEGATIVE Final    Comment: (NOTE) SARS-CoV-2 target nucleic acids are NOT DETECTED.  The SARS-CoV-2 RNA is generally detectable in upper and lower respiratory specimens during the acute phase of infection. The lowest concentration of SARS-CoV-2 viral copies this assay can detect is 250 copies / mL. A negative result does not preclude SARS-CoV-2 infection and should not be used as the sole basis for treatment or other patient management decisions.  A negative result may occur with improper specimen collection / handling, submission of specimen other than nasopharyngeal swab, presence of viral mutation(s) within the areas targeted by this  assay, and inadequate number of viral copies (<250 copies / mL). A negative result must be combined with clinical observations, patient history, and epidemiological information.  Fact Sheet for Patients:   RoadLapTop.co.za  Fact Sheet for Healthcare Providers: http://kim-miller.com/  This test is not yet approved or  cleared by the [place] FDA and has been authorized for detection and/or diagnosis of SARS-CoV-2 by FDA under an Emergency Use Authorization (EUA).  This EUA will remain in effect (meaning this test can be used) for the duration of the COVID-19 declaration under Section 564(b)(1) of the Act, 21 U.S.C. section 360bbb-3(b)(1), unless the authorization is terminated or revoked sooner.  Performed at [facility], [address] [place], [place] [address]   Culture, blood (Routine X 2) w Reflex to ID Panel     Status: None (Preliminary result)   Collection Time: 06/14/22  9:09 AM   Specimen: BLOOD RIGHT HAND  Result Value Ref Range Status   Specimen Description   Final    BLOOD RIGHT HAND BOTTLES DRAWN AEROBIC AND ANAEROBIC   Special Requests   Final    Blood Culture results may not be optimal due to an excessive volume of blood received in culture bottles   Culture   Final    NO GROWTH 3 DAYS Performed at [facility], [address] [place], [place] [address]    Report Status PENDING  Incomplete  MRSA Next Gen by PCR, Nasal     Status: Abnormal   Collection Time: 06/14/22  9:45 AM   Specimen: Nasal Mucosa; Nasal Swab  Result Value Ref Range Status   MRSA by PCR Next Gen DETECTED (A)  NOT DETECTED Final    Comment: RESULT CALLED TO, READ BACK BY AND VERIFIED WITH: EANES M @ 76 ON [id number] BY HENDERSON L (NOTE) The GeneXpert MRSA Assay (FDA approved for NASAL specimens only), is one component of a comprehensive MRSA colonization surveillance program. It is not intended to diagnose MRSA infection nor to guide or  monitor treatment for MRSA infections. Test performance is not FDA approved in patients less than 24 years old. Performed at [facility], [address] [place], [place] [address]   Respiratory (~20 pathogens) panel by PCR     Status: None   Collection Time: 06/14/22  9:30 PM   Specimen: Nasopharyngeal Swab; Respiratory  Result Value Ref Range Status   Adenovirus NOT DETECTED NOT DETECTED Final   Coronavirus 229E NOT DETECTED NOT DETECTED Final    Comment: (NOTE) The Coronavirus on the Respiratory Panel, DOES NOT test for the novel  Coronavirus (2019 nCoV)    Coronavirus HKU1 NOT DETECTED NOT DETECTED Final   Coronavirus NL63 NOT DETECTED NOT DETECTED Final   Coronavirus OC43 NOT DETECTED NOT DETECTED Final   Metapneumovirus NOT DETECTED NOT DETECTED Final   Rhinovirus / Enterovirus NOT DETECTED NOT DETECTED Final   Influenza A NOT DETECTED NOT DETECTED Final   Influenza B NOT DETECTED NOT DETECTED Final   Parainfluenza Virus 1 NOT DETECTED NOT DETECTED Final   Parainfluenza Virus 2 NOT DETECTED NOT DETECTED Final   Parainfluenza Virus 3 NOT DETECTED NOT DETECTED Final   Parainfluenza Virus 4 NOT DETECTED NOT DETECTED Final   Respiratory Syncytial Virus NOT DETECTED NOT DETECTED Final   Bordetella pertussis NOT DETECTED NOT DETECTED Final   Bordetella Parapertussis NOT DETECTED NOT DETECTED Final   Chlamydophila pneumoniae NOT DETECTED NOT DETECTED Final   Mycoplasma pneumoniae NOT DETECTED NOT DETECTED Final    Comment: Performed at [facility], [place] [address] [place], [place] [address]  Aerobic/Anaerobic Culture w Gram Stain (surgical/deep wound)     Status: None (Preliminary result)   Collection Time: 06/16/22  1:14 PM   Specimen: Lung  Result Value Ref Range Status   Specimen Description LUNG  Final   Special Requests NONE  Final   Gram Stain   Final    RARE WBC PRESENT,BOTH PMN AND MONONUCLEAR NO ORGANISMS SEEN    Culture   Final    NO GROWTH < 24  HOURS Performed at [facility], [place] [address] [place], [place] [address]    Report Status PENDING  Incomplete  Body fluid culture w Gram Stain     Status: None (Preliminary result)   Collection Time: 06/16/22  4:55 PM   Specimen: Pleural Fluid  Result Value Ref Range Status   Specimen Description PLEURAL  Final   Special Requests NONE  Final   Gram Stain NO WBC SEEN NO ORGANISMS SEEN   Final   Culture   Final    NO GROWTH < 12 HOURS Performed at [facility], [place] [address], [place], [place] [address]    Report Status PENDING  Incomplete    Radiology Studies: DG Chest Port 1 View  Result Date: 06/17/2022 CLINICAL DATA:  Left chest tube EXAM: PORTABLE CHEST 1 VIEW COMPARISON:  Radiograph 06/15/2022 FINDINGS: Unchanged cardiomediastinal silhouette. Slightly decreased left pleural effusion and adjacent basilar opacities with left basilar chest tube in place. No pneumothorax. Right lung is clear. Bones are unchanged including severe right glenohumeral osteoarthritis. IMPRESSION: Slightly decreased left pleural effusion with adjacent basilar opacities  with basilar pigtail chest tube in place Electronically Signed   By: Caprice Renshaw M.D.   On: 06/17/2022 08:17      Kailin Principato T. Ariyah Sedlack [facility]  If 7PM-7AM, please contact night-coverage [email or web address] 06/17/2022, 2:13 PM

## 2022-06-17 NOTE — Progress Notes (Signed)
NAME:  David Callahan, MRN:  [id number], DOB:  [birth year], LOS: 3 ADMISSION DATE:  06/14/2022, CONSULTATION DATE:  06/14/2022 REFERRING MD:  Almon Hercules, MD, CHIEF COMPLAINT:  pleural effusion  History of Present Illness:  89 y9o man with history of COPD and ongoing tobacco use disorder. Recent hospitalization for COPD exacerbation and left pleural effusion in August 2023. This was drained by IR, but not fully drained - was exudative. He was discharged home on abx 8/26 and represented to the ED at [facility] 9/2 for worsening shortness of breath. Found to have persistent let pleural effusion,   Pertinent  Medical History  Copd Tobacco use disorder  Significant Hospital Events: Including procedures, antibiotic start and stop dates in addition to other pertinent events   9/4 CT guided pigtail by IR  Interim History / Subjective:  850 cc drainage Feels improved, no dyspnea or chest pain  Objective   Blood pressure 124/72, pulse 74, temperature 98.3 F (36.8 C), temperature source Oral, resp. rate 17, height 5\' 10"  (1.778 m), weight 88.7 kg, SpO2 99 %.        Intake/Output Summary (Last 24 hours) at 06/17/2022 08/17/2022 Last data filed at 06/17/2022 0700 Gross per 24 hour  Intake 2060.14 ml  Output 4015 ml  Net -1954.86 ml    Filed Weights   06/14/22 0811 06/15/22 0500 06/16/22 0421  Weight: 86.2 kg 85.4 kg 88.7 kg    Examination: General: sitting up in bed , no distress HENT: No pallor, JVD or lymphadenopathy Lungs: Decreased breath sounds left base, no rhonchi, no accessory muscle use Cardiovascular: RRR no mrg Abdomen: soft, nontender Extremities: no edema Neuro: Alert, interactive, nonfocal  Chest x-ray independently reviewed shows decrease left effusion, left pigtail in place Labs show decreased leukocytosis, stable anemia, creatinine improving to 1.3, normal electrolytes, albumin 1.7  Pleural fluid results show predominant neutrophilic exudate Pleural fluid culture no  growth  Resolved Hospital Problem list     Assessment & Plan:    Persistent left pleural effusion, exudative, suspected parapneumonic vs empyema COPD with ongoing tobacco use disorder  Intrapleural lytics administered, initial dosing today , gust with nursing Continue cefepime vancomycin until pleural fluid culture clarified Continue home inahlers Repeat chest x-ray in a.m. Nicotine patch  Rest per primary team.     Labs   CBC: Recent Labs  Lab 06/14/22 0830 06/15/22 0621 06/15/22 0854 06/16/22 0153 06/17/22 0349  WBC 24.8* 18.2* 16.8* 16.5* 14.0*  NEUTROABS 20.1*  --  12.3* 11.9* 9.6*  HGB 9.5* 8.7* 9.1* 8.6* 8.7*  HCT 29.6* 26.9* 27.2* 26.4* 26.6*  MCV 89.4 89.1 87.2 87.4 86.9  PLT 670* 652* 630* 624* 551*     Basic Metabolic Panel: Recent Labs  Lab 06/14/22 0830 06/15/22 0621 06/15/22 0854 06/16/22 0153 06/17/22 0349  NA 136 136 135 137 137  K 3.6 4.2 4.3 4.5 4.2  CL 99 103 104 104 101  CO2 25 24 22  21* 25  GLUCOSE 118* 83 89 80 89  BUN 30* 18 18 17 17   CREATININE 2.40* 1.65* 1.55* 1.38*  1.40* 1.29*  CALCIUM 8.2* 8.1* 8.2* 8.1* 8.2*  MG  --   --   --  1.7 1.7  PHOS  --   --   --  3.6 2.9    GFR: Estimated Creatinine Clearance: 64 mL/min (A) (by C-G formula based on SCr of 1.29 mg/dL (H)). Recent Labs  Lab 06/14/22 06/14/22 1059 06/14/22 2052 06/15/22 08/14/22 06/15/22 08/15/22 06/16/22 0153  06/17/22 0349  PROCALCITON  --   --  0.48  --   --   --   --   WBC  --   --   --  18.2* 16.8* 16.5* 14.0*  LATICACIDVEN 2.5* 2.0* 1.4  --   --   --   --      Liver Function Tests: Recent Labs  Lab 06/14/22 0830 06/15/22 0621 06/15/22 0854 06/16/22 0153 06/17/22 0349  AST 25 28 35  --   --   ALT 28 28 24   --   --   ALKPHOS 85 70 79  --   --   BILITOT 1.0 0.6 0.9  --   --   PROT 6.5 5.5* 5.5*  --   --   ALBUMIN 2.2* 1.7* 1.8* 1.8* 1.7*    Recent Labs  Lab 06/14/22 0830  LIPASE 22    No results for input(s): "AMMONIA" in the last 168  hours.  ABG    Component Value Date/Time   HCO3 31.6 (H) 06/04/2022 2032   O2SAT 30.9 06/04/2022 2032     Coagulation Profile: No results for input(s): "INR", "PROTIME" in the last 168 hours.  Cardiac Enzymes: No results for input(s): "CKTOTAL", "CKMB", "CKMBINDEX", "TROPONINI" in the last 168 hours.  HbA1C: Hgb A1c MFr Bld  Date/Time Value Ref Range Status  06/05/2022 05:03 AM 5.4 4.8 - 5.6 % Final    Comment:    (NOTE) Pre diabetes:          5.7%-6.4%  Diabetes:              >6.4%  Glycemic control for   <7.0% adults with diabetes     CBG: Recent Labs  Lab 06/14/22 0817  GLUCAP 131*    08/14/22 MD. Cyril Mourning. [facility] Pager : [phone]  If no response to pager , please call [phone] until 7 pm After 7:00 pm call Elink  [phone]   06/17/2022

## 2022-06-17 NOTE — Progress Notes (Signed)
Mobility Specialist Progress Note:   06/17/22 1000  Mobility  Activity Ambulated with assistance in hallway  Level of Assistance Independent after set-up  Assistive Device None  Distance Ambulated (ft) 280 ft  Activity Response Tolerated well  $Mobility charge 1 Mobility   Pt received in chair willing to participate in mobility. Complaints of pain at chest tube site. Left in chair with call bell in reach and all needs met.   [facility] Frankie Zito Mobility Specialist

## 2022-06-17 NOTE — Progress Notes (Signed)
Referring Physician(s): Dr Marchelle Gearing  Supervising Physician: Irish Lack  Patient Status:  [facility] - In-pt  Chief Complaint:  Loculated left pleural effusion Left chest tube placement in IR 9/4   Subjective:  Successful CT-guided placement of 14 F catheter into LEFT chest. Plan:  - Fibrinolysis per Pulmonary team. - L chest tube to -20 cm H20 suction. Additional management per Primary  Up in bed and eating reg diet Doing well Breathing better for sure  CXR today IMPRESSION: Slightly decreased left pleural effusion with adjacent basilar opacities with basilar pigtail chest tube in place  Allergies: Motrin [ibuprofen]  Medications: Prior to Admission medications   Medication Sig Start Date End Date Taking? Authorizing Provider  albuterol (VENTOLIN HFA) 108 (90 Base) MCG/ACT inhaler Inhale 2 puffs into the lungs every 6 (six) hours as needed for shortness of breath.   Yes [provider]  atorvastatin (LIPITOR) 40 MG tablet Take 40 mg by mouth daily.   Yes [provider]  benazepril (LOTENSIN) 40 MG tablet Take 40 mg by mouth daily. 09/18/20  Yes [provider]  budesonide-formoterol (SYMBICORT) 160-4.5 MCG/ACT inhaler Inhale 2 puffs into the lungs daily as needed (short of breath).   Yes [provider]  cyclobenzaprine (FLEXERIL) 10 MG tablet Take 10 mg by mouth at bedtime. 09/17/20  Yes [provider]  dextromethorphan-guaiFENesin (MUCINEX DM) 30-600 MG 12hr tablet Take 1 tablet by mouth 2 (two) times daily. 06/07/22  Yes Kathlen Mody, MD  diltiazem (CARDIZEM CD) 300 MG 24 hr capsule Take 300 mg by mouth daily. 09/18/20  Yes [provider]  HYDROcodone-acetaminophen (NORCO/VICODIN) 5-325 MG tablet Take 2 tablets by mouth every 6 (six) hours as needed for moderate pain or severe pain. 08/17/20  Yes [provider]  Ipratropium-Albuterol (COMBIVENT RESPIMAT) 20-100 MCG/ACT AERS respimat Inhale 1 puff into  the lungs every 6 (six) hours as needed for wheezing. 10/08/20  Yes Shah, Pratik D, DO  levofloxacin (LEVAQUIN) 750 MG tablet Take 750 mg by mouth daily. Starting 08.23.23 x 7 days. 06/04/22  Yes [provider]  rOPINIRole (REQUIP) 0.25 MG tablet Take 0.75 mg by mouth at bedtime. 09/18/20  Yes [provider]  traZODone (DESYREL) 50 MG tablet Take 50 mg by mouth at bedtime. 09/26/20  Yes [provider]  Dwyane Luo 200-62.5-25 MCG/ACT AEPB Inhale 1 puff into the lungs daily. 01/03/22  Yes [provider]     Vital Signs: BP 124/72 (BP Location: Left Arm)   Pulse 74   Temp 98.3 F (36.8 C) (Oral)   Resp 17   Ht 5\' 10"  (1.778 m)   Wt 195 lb 8 oz (88.7 kg)   SpO2 99%   BMI 28.05 kg/m   Physical Exam Vitals reviewed.  Pulmonary:     Effort: Pulmonary effort is normal.     Breath sounds: Wheezing present.  Skin:    General: Skin is warm.     Comments: Site is clean and dry NT No bleeding No sign of infection OP serous color: 850 cc in Pleurvac No air leak    Neurological:     Mental Status: He is alert.     Imaging: DG Chest Port 1 View  Result Date: 06/17/2022 CLINICAL DATA:  Left chest tube EXAM: PORTABLE CHEST 1 VIEW COMPARISON:  Radiograph 06/15/2022 FINDINGS: Unchanged cardiomediastinal silhouette. Slightly decreased left pleural effusion and adjacent basilar opacities with left basilar chest tube in place. No pneumothorax. Right lung is clear. Bones are unchanged  including severe right glenohumeral osteoarthritis. IMPRESSION: Slightly decreased left pleural effusion with adjacent basilar opacities with basilar pigtail chest tube in place Electronically Signed   By: Caprice Renshaw M.D.   On: 06/17/2022 08:17   CT [facility] PLEURAL DRAIN W/INDWELL CATH W/IMG GUIDE  Result Date: 06/17/2022 INDICATION: Loculated LEFT pleural effusion EXAM: CT-GUIDED LEFT PIGTAIL THORACOSTOMY CATHETER PLACEMENT COMPARISON:  None Available. MEDICATIONS: The  patient is currently admitted to the hospital and receiving intravenous antibiotics. The antibiotics were administered within an appropriate time frame prior to the initiation of the procedure. ANESTHESIA/SEDATION: Moderate (conscious) sedation was employed during this procedure. A total of Versed 2 mg and Fentanyl 100 mcg was administered intravenously. Moderate Sedation Time: 20 minutes. The patient's level of consciousness and vital signs were monitored continuously by radiology nursing throughout the procedure under my direct supervision. CONTRAST:  None COMPLICATIONS: None immediate. PROCEDURE: RADIATION DOSE REDUCTION: This exam was performed according to the departmental dose-optimization program which includes automated exposure control, adjustment of the mA and/or kV according to patient size and/or use of iterative reconstruction technique. Informed written consent was obtained from the patient and/or patient's representative after a discussion of the risks, benefits and alternatives to treatment. The patient was placed wedged supine on the CT gantry and a pre procedural CT was performed re-demonstrating the known fluid collection within the LEFT chest. The procedure was planned. A timeout was performed prior to the initiation of the procedure. The lateral LEFT chest was prepped and draped in the usual sterile fashion. The overlying soft tissues were anesthetized with 1% lidocaine with epinephrine. Appropriate trajectory was planned with the use of a 22 gauge spinal needle. An 18 gauge trocar needle was advanced into the fluid collection and a short Amplatz super stiff wire was coiled within the collection. Appropriate positioning was confirmed with a limited CT scan. The tract was serially dilated allowing placement of a 14 Fr drainage catheter. Appropriate positioning was confirmed with a limited postprocedural CT scan. 40 mL ml of serous pleural fluid was aspirated. The tube was connected to a pleura  vac suction and sutured in place. A dressing was placed. The patient tolerated the procedure well without immediate post procedural complication. IMPRESSION: Successful CT guided placement of a 14 Fr LEFT pigtail thoracostomy drainage catheter for fibrinolytic therapy of a loculated LEFT pleural effusion. Samples were sent to the laboratory as requested by the ordering clinical team. Roanna Banning, MD [facility] Electronically Signed   By: Roanna Banning M.D.   On: 06/17/2022 08:06   DG Chest Port 1 View  Result Date: 06/15/2022 CLINICAL DATA:  Left pleural effusion.  Attempted thoracentesis. EXAM: PORTABLE CHEST 1 VIEW COMPARISON:  06/14/2022 FINDINGS: Stable loculated left pleural effusion. Left base collapse/consolidation similar to prior. No evidence for pneumothorax. Right lung clear. The cardiopericardial silhouette is within normal limits for size. Telemetry leads overlie the chest. IMPRESSION: No evidence for pneumothorax status post attempted left thoracentesis. Electronically Signed   By: Kennith Center M.D.   On: 06/15/2022 16:46   CT Chest Wo Contrast  Result Date: 06/14/2022 CLINICAL DATA:  Pneumonia. EXAM: CT CHEST WITHOUT CONTRAST TECHNIQUE: Multidetector CT imaging of the chest was performed following the standard protocol without IV contrast. RADIATION DOSE REDUCTION: This exam was performed according to the departmental dose-optimization program which includes automated exposure control, adjustment of the mA and/or kV according to patient size and/or use of iterative reconstruction technique. COMPARISON:  June 04, 2022. FINDINGS: Cardiovascular: Atherosclerosis  of thoracic aorta is noted without aneurysm formation. Normal cardiac size. No pericardial effusion. Mild coronary artery calcifications are noted. Mediastinum/Nodes: Thyroid gland and esophagus are unremarkable. Stable calcified adenopathy is noted most consistent with prior  granulomatous disease. Lungs/Pleura: Right lung is clear. No definite pneumothorax is noted. Moderate size loculated left pleural effusion is again noted; there does appear to be the interval development of several foci of gas within this fluid collection which may represent either infection or attempted intervention. Mild associated atelectasis of the left upper and lower lobes is noted. Upper Abdomen: Calcified splenic granulomas are again noted. Musculoskeletal: No chest wall mass or suspicious bone lesions identified. IMPRESSION: Moderate size loculated left pleural effusion is again noted and not significantly changed in size. There does appear to be the interval development of several foci of gas within the fluid collection which may represent either infection or attempted iatrogenic intervention Mild coronary artery calcifications are noted. Aortic Atherosclerosis (ICD10-I70.0). Electronically Signed   By: Lupita Raider M.D.   On: 06/14/2022 09:59   DG Chest Port 1 View  Result Date: 06/14/2022 CLINICAL DATA:  Hypotension with generalized weakness. EXAM: PORTABLE CHEST 1 VIEW COMPARISON:  06/05/2022 FINDINGS: Right lung clear. Loculated left pleural effusion is progressive in the interval, now moderate to large in size with associated left base collapse/consolidation. Cardiopericardial silhouette is at upper limits of normal for size. The visualized bony structures of the thorax are unremarkable. Telemetry leads overlie the chest. IMPRESSION: Interval progression of loculated left pleural effusion, now moderate to large in size. Left base collapse/consolidation. Electronically Signed   By: Kennith Center M.D.   On: 06/14/2022 09:04    Labs:  CBC: Recent Labs    06/15/22 0621 06/15/22 0854 06/16/22 0153 06/17/22 0349  WBC 18.2* 16.8* 16.5* 14.0*  HGB 8.7* 9.1* 8.6* 8.7*  HCT 26.9* 27.2* 26.4* 26.6*  PLT 652* 630* 624* 551*    COAGS: Recent Labs    06/04/22 2012  INR 1.4*     BMP: Recent Labs    06/15/22 0621 06/15/22 0854 06/16/22 0153 06/17/22 0349  NA 136 135 137 137  K 4.2 4.3 4.5 4.2  CL 103 104 104 101  CO2 24 22 21* 25  GLUCOSE 83 89 80 89  BUN 18 18 17 17   CALCIUM 8.1* 8.2* 8.1* 8.2*  CREATININE 1.65* 1.55* 1.38*  1.40* 1.29*  GFRNONAA 46* 49* 57*  56* >60    LIVER FUNCTION TESTS: Recent Labs    06/06/22 1200 06/14/22 0830 06/15/22 0621 06/15/22 0854 06/16/22 0153 06/17/22 0349  BILITOT 0.6 1.0 0.6 0.9  --   --   AST 149* 25 28 35  --   --   ALT 73* 28 28 24   --   --   ALKPHOS 79 85 70 79  --   --   PROT 6.2* 6.5 5.5* 5.5*  --   --   ALBUMIN 2.0* 2.2* 1.7* 1.8* 1.8* 1.7*    Assessment and Plan:  Left chest tube placed in IR 9/4 for loculated pleural effusion Feeling better today Will follow with PCCM    Electronically Signed: , PA-C 06/17/2022, 10:32 AM   I spent a total of 15 Minutes at the the patient's bedside AND on the patient's hospital floor or unit, greater than 50% of which was counseling/coordinating care for Left chest tube placement

## 2022-06-17 NOTE — Plan of Care (Signed)

## 2022-06-17 NOTE — Care Management (Signed)
  Transition of Care [facility]) Screening Note   Patient Details  Name: David Callahan Date of Birth: November 15, 1955   [facility]) CM/SW Contact:    Gala Lewandowsky, RN Phone Number: 06/17/2022, 11:02 AM    Transition of Care Department [facility]) has reviewed the patient and no TOC needs have been identified at this time. We will continue to monitor patient advancement through interdisciplinary progression rounds. If new patient transition needs arise, please place a TOC consult.

## 2022-06-17 NOTE — Procedures (Addendum)
Pleural Fibrinolytic Administration Procedure Note  David Callahan  [id number]  [birth year]  Date:06/17/22  Time:9:06 AM   Provider Performing:Leelynd Maldonado V. Darlys Buis   Procedure: Pleural Fibrinolysis Initial day (38466)  Indication(s) Fibrinolysis of complicated pleural effusion  Consent Risks of the procedure as well as the alternatives and risks of each were explained to the patient and/or caregiver.  Consent for the procedure was obtained.   Anesthesia None   Time Out Verified patient identification, verified procedure, site/side was marked, verified correct patient position, special equipment/implants available, medications/allergies/relevant history reviewed, required imaging and test results available.   Sterile Technique Hand hygiene, gloves   Procedure Description Existing pleural catheter was cleaned and accessed in sterile manner.  10mg  of tPA in 30cc of saline and 5mg  of dornase in 30cc of sterile water were injected into pleural space using existing pleural catheter.  Catheter will be clamped for 1 hour and then placed back to suction.   Complications/Tolerance None; patient tolerated the procedure well.   EBL None   Specimen(s) None  David Callahan V. AlvaM D

## 2022-06-18 ENCOUNTER — Inpatient Hospital Stay (HOSPITAL_COMMUNITY): Payer: Medicare PPO

## 2022-06-18 DIAGNOSIS — D72825 Bandemia: Secondary | ICD-10-CM | POA: Diagnosis not present

## 2022-06-18 DIAGNOSIS — J90 Pleural effusion, not elsewhere classified: Secondary | ICD-10-CM | POA: Diagnosis not present

## 2022-06-18 DIAGNOSIS — J189 Pneumonia, unspecified organism: Secondary | ICD-10-CM | POA: Diagnosis not present

## 2022-06-18 DIAGNOSIS — N179 Acute kidney failure, unspecified: Secondary | ICD-10-CM | POA: Diagnosis not present

## 2022-06-18 LAB — CBC
HCT: 28.7 % — ABNORMAL LOW (ref 39.0–52.0)
Hemoglobin: 9.5 g/dL — ABNORMAL LOW (ref 13.0–17.0)
MCH: 28.6 pg (ref 26.0–34.0)
MCHC: 33.1 g/dL (ref 30.0–36.0)
MCV: 86.4 fL (ref 80.0–100.0)
Platelets: 590 10*3/uL — ABNORMAL HIGH (ref 150–400)
RBC: 3.32 MIL/uL — ABNORMAL LOW (ref 4.22–5.81)
RDW: 17.7 % — ABNORMAL HIGH (ref 11.5–15.5)
WBC: 13.5 10*3/uL — ABNORMAL HIGH (ref 4.0–10.5)
nRBC: 0 % (ref 0.0–0.2)

## 2022-06-18 LAB — RENAL FUNCTION PANEL
Albumin: 1.8 g/dL — ABNORMAL LOW (ref 3.5–5.0)
Anion gap: 9 (ref 5–15)
BUN: 13 mg/dL (ref 8–23)
CO2: 23 mmol/L (ref 22–32)
Calcium: 8 mg/dL — ABNORMAL LOW (ref 8.9–10.3)
Chloride: 102 mmol/L (ref 98–111)
Creatinine, Ser: 1.03 mg/dL (ref 0.61–1.24)
GFR, Estimated: >60 mL/min (ref >60)
Glucose, Bld: 85 mg/dL (ref 70–99)
Phosphorus: 2.9 mg/dL (ref 2.5–4.6)
Potassium: 3.7 mmol/L (ref 3.5–5.1)
Sodium: 134 mmol/L — ABNORMAL LOW (ref 135–145)

## 2022-06-18 LAB — MAGNESIUM: Magnesium: 1.7 mg/dL (ref 1.7–2.4)

## 2022-06-18 LAB — LEGIONELLA PNEUMOPHILA SEROGP 1 UR AG: L. pneumophila Serogp 1 Ur Ag: NEGATIVE

## 2022-06-18 LAB — PATHOLOGIST SMEAR REVIEW: Path Review: NEGATIVE

## 2022-06-18 MED ORDER — VANCOMYCIN HCL IN DEXTROSE 1-5 GM/200ML-% IV SOLN
1000.0000 mg | Freq: Two times a day (BID) | INTRAVENOUS | Status: DC
  Administered 2022-06-18 – 2022-06-19 (×4): 1000 mg via INTRAVENOUS
  Filled 2022-06-18 (×5): qty 200

## 2022-06-18 MED ORDER — VANCOMYCIN HCL IN DEXTROSE 1-5 GM/200ML-% IV SOLN: 1000.0000 mg | Freq: Two times a day (BID) | INTRAVENOUS | Status: DC

## 2022-06-18 MED ORDER — SODIUM CHLORIDE (PF) 0.9 % IJ SOLN
10.0000 mg | Freq: Once | INTRAMUSCULAR | Status: AC
  Administered 2022-06-18: 10 mg via INTRAPLEURAL
  Filled 2022-06-18: qty 10

## 2022-06-18 MED ORDER — SODIUM CHLORIDE 0.9% FLUSH: 10.0000 mL | Freq: Three times a day (TID) | INTRAVENOUS | Status: DC

## 2022-06-18 MED ORDER — SODIUM CHLORIDE 0.9 % IV SOLN
2.0000 g | Freq: Three times a day (TID) | INTRAVENOUS | Status: DC
  Administered 2022-06-18 – 2022-06-20 (×6): 2 g via INTRAVENOUS
  Filled 2022-06-18 (×6): qty 12.5

## 2022-06-18 MED ORDER — STERILE WATER FOR INJECTION IJ SOLN
5.0000 mg | Freq: Once | INTRAMUSCULAR | Status: AC
  Administered 2022-06-18: 5 mg via INTRAPLEURAL
  Filled 2022-06-18: qty 5

## 2022-06-18 NOTE — Progress Notes (Signed)
PROGRESS NOTE  MOO GRAVLEY AOZ:[id number] DOB: March 19, 1956   PCP: David Callahan, No Pcp Per  David Callahan is from: Home.  Lives alone.  Independently ambulates at baseline.  DOA: 06/14/2022 LOS: 4  Chief complaints Chief Complaint  David Callahan presents with   Weakness   Dizziness     Brief Narrative / Interim history: 66 year old M with PMH of COPD, HTN, tobacco use disorder, recurrent pleural effusion, recent hospitalization from 8/23-28 for CAP/exudative pleural effusion s/p thoracocentesis, and AKI returning to [facility] with increased shortness of breath, dizziness and low blood pressure, and admitted for recurrent exudative left pleural effusion.  CT chest showed moderate left loculated pleural effusion with gas.  He has WBC of 25 with left shift.  Creatinine 2.4 (1.3 on discharge recently).  Lactic acid 2.5.  David Callahan was started on vancomycin and cefepime.  Pulmonology consulted.  David Callahan was transferred to [facility] for further evaluation and management.  IR consulted and had CT-guided chest tube placed on 9/4 with removal of 700 cc exudative and cultures negative fluid right away.  Intrapleural lytics administered on 9/5.  IR and pulmonology following.  Subjective: Seen and examined earlier this morning.  No major events overnight of this morning.  No complaints.  Resting comfortably.  About 3.2 L UOP/24 hours.  About 1.7 L from chest tube.  Objective: Vitals:   06/18/22 0552 06/18/22 0800 06/18/22 1256 06/18/22 1425  BP: 133/73 (!) 167/90 (!) 134/97 117/71  Pulse:  (!) 58 90 70  Resp: 16 18 18 14   Temp: 98 F (36.7 C)  98.2 F (36.8 C) 98.1 F (36.7 C)  TempSrc: Oral  Oral Oral  SpO2:  100% 94% 99%  Weight:      Height:        Examination: GENERAL: No apparent distress.  Nontoxic. HEENT: MMM.  Vision and hearing grossly intact.  NECK: Supple.  No apparent JVD.  RESP:  No IWOB.  Diminished aeration over LLL.  Chest tube to left chest. CVS:  RRR. Heart sounds normal.   ABD/GI/GU: BS+. Abd soft, NTND.  MSK/EXT:  Moves extremities. No apparent deformity. No edema.  SKIN: no apparent skin lesion or wound NEURO: Awake and alert. Oriented appropriately.  No apparent focal neuro deficit. PSYCH: Calm. Normal affect.   Procedures:  9/4-image guided left chest tube placement  Microbiology summarized: COVID-19 PCR nonreactive. Full RVP nonreactive. Blood cultures NGTD. MRSA PCR screen positive. Pleural fluid culture NGTD.  Assessment and plan: Principal Problem:   Pleural effusion Active Problems:   COPD (chronic obstructive pulmonary disease) (HCC)   Leukocytosis   CAP (community acquired pneumonia)   AKI (acute kidney injury) (HCC)  Community-acquired pneumonia with recurrent pleural effusion concerning for parapneumonic effusion-POA.  Hospitalized 8/23-28 and had thoracocentesis with removal of exudative and cultures negative fluid.  He was discharged on Levaquin.  Returns with SOB, hypotension and near syncope.  Had leukocytosis to 25 with left shift.  Lactic acidosis of 2.5.  CT chest as above.  MRSA PCR screen positive.  RVP and COVID-19 PCR negative.  Blood cultures NGTD.  Leukocytosis improved. -CT-guided chest tube by IR on 9/4. Fluid culture NGTD.  1.7 L from chest tube in the last 24 hours. -Lytics per pulmonology -Continue vancomycin, cefepime and Flagyl -Follow-up fluid cytology, urine Legionella and QuantiFERON gold -Pulmonary toilet  Chronic COPD/tobacco abuse- -Continue bronchodilators and nicotine patch -Encouraged smoking cessation.   Hypotension/history of hypertension: Hypotension resolved.   -Continue holding home benazepril in the setting of AKI  and Cardizem in the setting of borderline heart rate -Start low-dose amlodipine  AKI: Likely prerenal in the setting of sepsis and hypotension.  Resolved. Recent Labs    06/04/22 1915 06/05/22 0503 06/06/22 1200 06/07/22 0419 06/14/22 0830 06/15/22 0621 06/15/22 0854  06/16/22 0153 06/17/22 0349 06/18/22 0631  BUN 38* 35* 30* 27* 30* 18 18 17 17 13   CREATININE 2.05* 1.72* 1.31* 1.27* 2.40* 1.65* 1.55* 1.38*  1.40* 1.29* 1.03  -Continue monitoring -Avoid nephrotoxic meds  Anemia of chronic disease with iron deficiency: Drop in Hgb likely dilutional from IVF.  Iron saturation 7%. Recent Labs    06/04/22 1915 06/05/22 0503 06/06/22 1200 06/07/22 0419 06/14/22 0830 06/15/22 0621 06/15/22 0854 06/16/22 0153 06/17/22 0349 06/18/22 0631  HGB 11.6* 10.5* 9.9* 10.0* 9.5* 8.7* 9.1* 8.6* 8.7* 9.5*  -Monitor H&H  Thrombocytosis: Likely reactive. -Monitor.  Bandemia: Likely due to #1.  Improving. -Continue monitoring  Lactic acidosis: Likely due to #1.  Resolved.  Restless leg syndrome -continue Requip  Sepsis ruled out.   Body mass index is 27.84 kg/m.           DVT prophylaxis:  heparin injection 5,000 Units Start: 06/14/22 2200 SCDs Start: 06/14/22 2116  Code Status: Full code Family Communication: None at bedside Level of care: Telemetry Medical Status is: Inpatient Remains inpatient appropriate because: Parapneumonic effusion   Final disposition: Likely home once medically cleared Consultants:  Pulmonology Interventional radiology  Sch Meds:  Scheduled Meds:  alteplase (CATHFLO ACTIVASE) 10 mg in sodium chloride (PF) 0.9 % 30 mL  10 mg Intrapleural Once   And   dornase alfa (PULMOZYME) 5 mg in sterile water (preservative free) 30 mL  5 mg Intrapleural Once   amLODipine  5 mg Oral Daily   atorvastatin  40 mg Oral Daily   Chlorhexidine Gluconate Cloth  6 each Topical Q0600   cyclobenzaprine  10 mg Oral QHS   dextromethorphan-guaiFENesin  1 tablet Oral BID   fluticasone furoate-vilanterol  1 puff Inhalation Daily   And   umeclidinium bromide  1 puff Inhalation Daily   heparin  5,000 Units Subcutaneous Q8H   multivitamin with minerals  1 tablet Oral Daily   mupirocin ointment  1 Application Nasal BID   nicotine   21 mg Transdermal Daily   rOPINIRole  0.75 mg Oral QHS   sodium chloride flush  10 mL Intrapleural Q8H   sodium chloride flush  3 mL Intravenous Q12H   traZODone  50 mg Oral QHS   Continuous Infusions:  sodium chloride 125 mL/hr at 06/18/22 1240   ceFEPime (MAXIPIME) IV     metronidazole 500 mg (06/18/22 1032)   vancomycin     PRN Meds:.acetaminophen **OR** acetaminophen, albuterol, bisacodyl, HYDROcodone-acetaminophen, ondansetron **OR** ondansetron (ZOFRAN) IV, polyethylene glycol, traZODone  Antimicrobials: Anti-infectives (From admission, onward)    Start     Dose/Rate Route Frequency Ordered Stop   06/19/22 0600  vancomycin (VANCOCIN) IVPB 1000 mg/200 mL premix  Status:  Discontinued        1,000 mg 200 mL/hr over 60 Minutes Intravenous Every 12 hours 06/18/22 1110 06/18/22 1152   06/18/22 1400  ceFEPIme (MAXIPIME) 2 g in sodium chloride 0.9 % 100 mL IVPB        2 g 200 mL/hr over 30 Minutes Intravenous Every 8 hours 06/18/22 1110     06/18/22 1300  vancomycin (VANCOCIN) IVPB 1000 mg/200 mL premix        1,000 mg 200 mL/hr over 60 Minutes Intravenous Every  12 hours 06/18/22 1152     06/16/22 1100  vancomycin (VANCOREADY) IVPB 1500 mg/300 mL  Status:  Discontinued        1,500 mg 150 mL/hr over 120 Minutes Intravenous Every 24 hours 06/16/22 0814 06/18/22 1110   06/16/22 0845  metroNIDAZOLE (FLAGYL) IVPB 500 mg        500 mg 100 mL/hr over 60 Minutes Intravenous Every 12 hours 06/16/22 0745 06/26/22 0800   06/15/22 1345  ceFAZolin (ANCEF) IVPB 2g/100 mL premix        2 g 200 mL/hr over 30 Minutes Intravenous To Radiology 06/15/22 1248 06/16/22 1345   06/15/22 1100  vancomycin (VANCOCIN) IVPB 1000 mg/200 mL premix  Status:  Discontinued        1,000 mg 200 mL/hr over 60 Minutes Intravenous Every 24 hours 06/14/22 1011 06/16/22 0814   06/14/22 2200  ceFEPIme (MAXIPIME) 2 g in sodium chloride 0.9 % 100 mL IVPB  Status:  Discontinued        2 g 200 mL/hr over 30 Minutes  Intravenous Every 12 hours 06/14/22 0943 06/18/22 1110   06/14/22 0945  vancomycin (VANCOREADY) IVPB 2000 mg/400 mL        2,000 mg 200 mL/hr over 120 Minutes Intravenous  Once 06/14/22 0934 06/14/22 1212   06/14/22 0930  vancomycin (VANCOCIN) IVPB 1000 mg/200 mL premix  Status:  Discontinued        1,000 mg 200 mL/hr over 60 Minutes Intravenous  Once 06/14/22 0925 06/14/22 0934   06/14/22 0930  ceFEPIme (MAXIPIME) 2 g in sodium chloride 0.9 % 100 mL IVPB        2 g 200 mL/hr over 30 Minutes Intravenous  Once 06/14/22 0925 06/14/22 1017        I have personally reviewed the following labs and images: CBC: Recent Labs  Lab 06/14/22 0830 06/15/22 0621 06/15/22 0854 06/16/22 0153 06/17/22 0349 06/18/22 0631  WBC 24.8* 18.2* 16.8* 16.5* 14.0* 13.5*  NEUTROABS 20.1*  --  12.3* 11.9* 9.6*  --   HGB 9.5* 8.7* 9.1* 8.6* 8.7* 9.5*  HCT 29.6* 26.9* 27.2* 26.4* 26.6* 28.7*  MCV 89.4 89.1 87.2 87.4 86.9 86.4  PLT 670* 652* 630* 624* 551* 590*   BMP &GFR Recent Labs  Lab 06/15/22 0621 06/15/22 0854 06/16/22 0153 06/17/22 0349 06/18/22 0631  NA 136 135 137 137 134*  K 4.2 4.3 4.5 4.2 3.7  CL 103 104 104 101 102  CO2 24 22 21* 25 23  GLUCOSE 83 89 80 89 85  BUN 18 18 17 17 13   CREATININE 1.65* 1.55* 1.38*  1.40* 1.29* 1.03  CALCIUM 8.1* 8.2* 8.1* 8.2* 8.0*  MG  --   --  1.7 1.7 1.7  PHOS  --   --  3.6 2.9 2.9   Estimated Creatinine Clearance: 79.9 mL/min (by C-G formula based on SCr of 1.03 mg/dL). Liver & Pancreas: Recent Labs  Lab 06/14/22 0830 06/15/22 0621 06/15/22 0854 06/16/22 0153 06/17/22 0349 06/18/22 0631  AST 25 28 35  --   --   --   ALT 28 28 24   --   --   --   ALKPHOS 85 70 79  --   --   --   BILITOT 1.0 0.6 0.9  --   --   --   PROT 6.5 5.5* 5.5*  --   --   --   ALBUMIN 2.2* 1.7* 1.8* 1.8* 1.7* 1.8*   Recent Labs  Lab 06/14/22 0830  LIPASE 22   No results for input(s): "AMMONIA" in the last 168 hours. Diabetic: No results for input(s):  "HGBA1C" in the last 72 hours. Recent Labs  Lab 06/14/22 0817  GLUCAP 131*   Cardiac Enzymes: No results for input(s): "CKTOTAL", "CKMB", "CKMBINDEX", "TROPONINI" in the last 168 hours. No results for input(s): "PROBNP" in the last 8760 hours. Coagulation Profile: No results for input(s): "INR", "PROTIME" in the last 168 hours. Thyroid Function Tests: No results for input(s): "TSH", "T4TOTAL", "FREET4", "T3FREE", "THYROIDAB" in the last 72 hours. Lipid Profile: No results for input(s): "CHOL", "HDL", "LDLCALC", "TRIG", "CHOLHDL", "LDLDIRECT" in the last 72 hours. Anemia Panel: Recent Labs    06/17/22 0349  FERRITIN 233   Urine analysis:    Component Value Date/Time   COLORURINE YELLOW 06/14/2022 1210   APPEARANCEUR CLEAR 06/14/2022 1210   LABSPEC 1.010 06/14/2022 1210   PHURINE 6.0 06/14/2022 1210   GLUCOSEU NEGATIVE 06/14/2022 1210   HGBUR SMALL (A) 06/14/2022 1210   BILIRUBINUR NEGATIVE 06/14/2022 1210   KETONESUR NEGATIVE 06/14/2022 1210   PROTEINUR NEGATIVE 06/14/2022 1210   NITRITE NEGATIVE 06/14/2022 1210   LEUKOCYTESUR NEGATIVE 06/14/2022 1210   Sepsis Labs: Invalid input(s): "PROCALCITONIN", "LACTICIDVEN"  Microbiology: Recent Results (from the past 240 hour(s))  Culture, blood (Routine X 2) w Reflex to ID Panel     Status: None (Preliminary result)   Collection Time: 06/14/22  8:50 AM   Specimen: Left Antecubital; Blood  Result Value Ref Range Status   Specimen Description   Final    LEFT ANTECUBITAL BOTTLES DRAWN AEROBIC AND ANAEROBIC   Special Requests Blood Culture adequate volume  Final   Culture   Final    NO GROWTH 4 DAYS Performed at [facility], [address], [place], [place] [address]    Report Status PENDING  Incomplete  SARS Coronavirus 2 by RT PCR (hospital order, performed in [facility] hospital lab) *cepheid single result test* Anterior Nasal Swab     Status: None   Collection Time: 06/14/22  8:58 AM   Specimen: Anterior Nasal Swab   Result Value Ref Range Status   SARS Coronavirus 2 by RT PCR NEGATIVE NEGATIVE Final    Comment: (NOTE) SARS-CoV-2 target nucleic acids are NOT DETECTED.  The SARS-CoV-2 RNA is generally detectable in upper and lower respiratory specimens during the acute phase of infection. The lowest concentration of SARS-CoV-2 viral copies this assay can detect is 250 copies / mL. A negative result does not preclude SARS-CoV-2 infection and should not be used as the sole basis for treatment or other David Callahan management decisions.  A negative result may occur with improper specimen collection / handling, submission of specimen other than nasopharyngeal swab, presence of viral mutation(s) within the areas targeted by this assay, and inadequate number of viral copies (<250 copies / mL). A negative result must be combined with clinical observations, David Callahan history, and epidemiological information.  Fact Sheet for Patients:   https://www.patel.info/  Fact Sheet for Healthcare Providers: https://hall.com/  This test is not yet approved or  cleared by the [place] FDA and has been authorized for detection and/or diagnosis of SARS-CoV-2 by FDA under an Emergency Use Authorization (EUA).  This EUA will remain in effect (meaning this test can be used) for the duration of the COVID-19 declaration under Section 564(b)(1) of the Act, 21 U.S.C. section 360bbb-3(b)(1), unless the authorization is terminated or revoked sooner.  Performed at [facility], [address] [place], [place] [address]   Culture, blood (  Routine X 2) w Reflex to ID Panel     Status: None (Preliminary result)   Collection Time: 06/14/22  9:09 AM   Specimen: BLOOD RIGHT HAND  Result Value Ref Range Status   Specimen Description   Final    BLOOD RIGHT HAND BOTTLES DRAWN AEROBIC AND ANAEROBIC   Special Requests   Final    Blood Culture results may not be optimal due to an excessive  volume of blood received in culture bottles   Culture   Final    NO GROWTH 4 DAYS Performed at [facility], [address], [place], [place] [address]    Report Status PENDING  Incomplete  MRSA Next Gen by PCR, Nasal     Status: Abnormal   Collection Time: 06/14/22  9:45 AM   Specimen: Nasal Mucosa; Nasal Swab  Result Value Ref Range Status   MRSA by PCR Next Gen DETECTED (A) NOT DETECTED Final    Comment: RESULT CALLED TO, READ BACK BY AND VERIFIED WITH: EANES M @ 1138 ON [id number] BY HENDERSON L (NOTE) The GeneXpert MRSA Assay (FDA approved for NASAL specimens only), is one component of a comprehensive MRSA colonization surveillance program. It is not intended to diagnose MRSA infection nor to guide or monitor treatment for MRSA infections. Test performance is not FDA approved in patients less than 32 years old. Performed at [facility], [address], [place], [place] [address]   Respiratory (~20 pathogens) panel by PCR     Status: None   Collection Time: 06/14/22  9:30 PM   Specimen: Nasopharyngeal Swab; Respiratory  Result Value Ref Range Status   Adenovirus NOT DETECTED NOT DETECTED Final   Coronavirus 229E NOT DETECTED NOT DETECTED Final    Comment: (NOTE) The Coronavirus on the Respiratory Panel, DOES NOT test for the novel  Coronavirus (2019 nCoV)    Coronavirus HKU1 NOT DETECTED NOT DETECTED Final   Coronavirus NL63 NOT DETECTED NOT DETECTED Final   Coronavirus OC43 NOT DETECTED NOT DETECTED Final   Metapneumovirus NOT DETECTED NOT DETECTED Final   Rhinovirus / Enterovirus NOT DETECTED NOT DETECTED Final   Influenza A NOT DETECTED NOT DETECTED Final   Influenza B NOT DETECTED NOT DETECTED Final   Parainfluenza Virus 1 NOT DETECTED NOT DETECTED Final   Parainfluenza Virus 2 NOT DETECTED NOT DETECTED Final   Parainfluenza Virus 3 NOT DETECTED NOT DETECTED Final   Parainfluenza Virus 4 NOT DETECTED NOT DETECTED Final   Respiratory Syncytial Virus NOT DETECTED NOT  DETECTED Final   Bordetella pertussis NOT DETECTED NOT DETECTED Final   Bordetella Parapertussis NOT DETECTED NOT DETECTED Final   Chlamydophila pneumoniae NOT DETECTED NOT DETECTED Final   Mycoplasma pneumoniae NOT DETECTED NOT DETECTED Final    Comment: Performed at [facility], [address], [place], [place] [address]  Aerobic/Anaerobic Culture w Gram Stain (surgical/deep wound)     Status: None (Preliminary result)   Collection Time: 06/16/22  1:14 PM   Specimen: Lung  Result Value Ref Range Status   Specimen Description LUNG  Final   Special Requests NONE  Final   Gram Stain   Final    RARE WBC PRESENT,BOTH PMN AND MONONUCLEAR NO ORGANISMS SEEN    Culture   Final    NO GROWTH 2 DAYS NO ANAEROBES ISOLATED; CULTURE IN PROGRESS FOR 5 DAYS Performed at [facility], [address] [place], [place] [address]    Report Status PENDING  Incomplete  Body fluid culture  w Gram Stain     Status: None (Preliminary result)   Collection Time: 06/16/22  4:55 PM   Specimen: Pleural Fluid  Result Value Ref Range Status   Specimen Description PLEURAL  Final   Special Requests NONE  Final   Gram Stain NO WBC SEEN NO ORGANISMS SEEN   Final   Culture   Final    NO GROWTH 2 DAYS Performed at [facility], [address], [place], [place] [address]    Report Status PENDING  Incomplete    Radiology Studies: DG Chest Port 1 View  Result Date: 06/18/2022 CLINICAL DATA:  Empyema lung.  Left chest tube. EXAM: PORTABLE CHEST 1 VIEW COMPARISON:  AP chest 06/17/2022 and 06/15/2022; CT chest 06/14/2022 FINDINGS: Left basilar pigtail drainage catheter is unchanged in position. Cardiac silhouette and mediastinal contours are within normal limits. Moderate thickening of the left mid to lower pleura and left basilar pleura is similar to 06/17/2022 frontal radiograph after chest tube placement and again decreased from 06/15/2022 radiograph. The right lung is clear. No pneumothorax. Mild  dextrocurvature of the mid to lower thoracic spine. Mild-to-moderate multilevel degenerative disc changes. IMPRESSION: 1. Left basilar pigtail drainage catheter is unchanged in position. 2. Moderate left lateral and lower pleural thickening/loculated pleural fluid is unchanged from 06/17/2022. Electronically Signed   By: Yvonne Kendall M.D.   On: 06/18/2022 08:47      F[facility] Alber[facility] T. [place]  If 7PM-7AM, please contact night-coverage [email or web address] 06/18/2022, 3:19 PM

## 2022-06-18 NOTE — Procedures (Signed)
Pleural Fibrinolytic Administration Procedure Note   David Callahan  [id number]  Nov 07, 1955   Date:06/18/22  Time:10:45 am   Provider Performing:S. Kao Conry ACNP   Procedure: Pleural Fibrinolysis subseqent day  Indication(s) Fibrinolysis of complicated pleural effusion  Consent Risks of the procedure as well as the alternatives and risks of each were explained to the patient and/or caregiver.  Consent for the procedure was obtained.   Anesthesia None   Time Out Verified patient identification, verified procedure, site/side was marked, verified correct patient position, special equipment/implants available, medications/allergies/relevant history reviewed, required imaging and test results available.   Sterile Technique Hand hygiene, gloves   Procedure Description Existing pleural catheter was cleaned and accessed in sterile manner.  10mg  of tPA in 30cc of saline and 5mg  of dornase in 30cc of sterile water were injected into pleural space using existing pleural catheter.  Catheter will be clamped for 1 hour and then placed back to suction.   Complications/Tolerance None; patient tolerated the procedure well.   EBL None   Specimen(s) None    Darroll Bredeson ACNP Acute Care Nurse Practitioner Pulmonary/Critical Care Please consult Amion 06/18/2022, 11:02 AM

## 2022-06-18 NOTE — Care Management Important Message (Signed)
Important Message  Patient Details  Name: David Callahan MRN: [id number] Date of Birth: 10-06-1956   Medicare Important Message Given:  Yes     Renie Ora 06/18/2022, 8:30 AM

## 2022-06-18 NOTE — Plan of Care (Signed)

## 2022-06-18 NOTE — Progress Notes (Addendum)
Pharmacy Antibiotic Note-Follow Up  David Callahan is a 66 y.o. male admitted on 06/14/2022 with pneumonia.  Pharmacy has been consulted for vancomycin and cefepime dosing (he is also on flagyl) -WBC= 13.5, afebrile, SCr= 1.03 -cultures: ngtd   Plan: -Change vanc to 1000mg  IV q12h; estimated AUC= 467 using SCr 1.0 -Change Cefepime to 2000 mg IV every 8 hours -Will follow renal function, cultures and clinical progress   Height: 5\' 10"  (177.8 cm) Weight: 88 kg (194 lb) IBW/kg (Calculated) : 73  Temp (24hrs), Avg:98.1 F (36.7 C), Min:98 F (36.7 C), Max:98.2 F (36.8 C)  Recent Labs  Lab 06/14/22 0850 06/14/22 1059 06/14/22 2052 06/15/22 0621 06/15/22 0854 06/16/22 0153 06/17/22 0349 06/18/22 0631  WBC  --   --   --  18.2* 16.8* 16.5* 14.0* 13.5*  CREATININE  --   --   --  1.65* 1.55* 1.38*  1.40* 1.29* 1.03  LATICACIDVEN 2.5* 2.0* 1.4  --   --   --   --   --      Estimated Creatinine Clearance: 79.9 mL/min (by C-G formula based on SCr of 1.03 mg/dL).    Allergies  Allergen Reactions   Motrin [Ibuprofen] Swelling    Anti-inflammatory analgesics- cause anaphylaxis    Antimicrobials this admission: Vanco 9/2 >> Cefepime 9/2 >> Flagyl 9/6>>  Microbiology results: 9/2 BCx: ngtd 9/2 Strep Pneumo urinary antigen: neg 9/2 Tb: pending 9/2 legionella: pending 9/4 pleural fluid- ngtd  Thank you for allowing pharmacy to be a part of this patient's care.  11/2, PharmD Clinical Pharmacist **Pharmacist phone directory can now be found on [email or web address] ([email or web address]).  Listed under [facility].

## 2022-06-18 NOTE — Progress Notes (Signed)
NAME:  David Callahan, MRN:  [id number], DOB:  1956/04/05, LOS: 4 ADMISSION DATE:  06/14/2022, CONSULTATION DATE:  06/14/2022 REFERRING MD:  Almon Hercules, MD, CHIEF COMPLAINT:  pleural effusion  History of Present Illness:  10 y9o man with history of COPD and ongoing tobacco use disorder. Recent hospitalization for COPD exacerbation and left pleural effusion in August 2023. This was drained by IR, but not fully drained - was exudative. He was discharged home on abx 8/26 and represented to the ED at [facility] 9/2 for worsening shortness of breath. Found to have persistent let pleural effusion,   Pertinent  Medical History  Copd Tobacco use disorder  Significant Hospital Events: Including procedures, antibiotic start and stop dates in addition to other pertinent events   9/4 CT guided pigtail by IR  Interim History / Subjective:  Patient feels better.  Sore at chest tube insertion site   Objective   Blood pressure (!) 167/90, pulse (!) 58, temperature 98 F (36.7 C), temperature source Oral, resp. rate 18, height 5\' 10"  (1.778 m), weight 88 kg, SpO2 100 %.        Intake/Output Summary (Last 24 hours) at 06/18/2022 0835 Last data filed at 06/18/2022 0600 Gross per 24 hour  Intake 1878 ml  Output 4880 ml  Net -3002 ml   Filed Weights   06/15/22 0500 06/16/22 0421 06/18/22 0500  Weight: 85.4 kg 88.7 kg 88 kg    Examination: General: sitting in bed, no distress  HENT: NCAT Lungs: Diminished breath sounds in the left base no rhonchi no rales crackles Cardiovascular: Regular rate and rhythm, S1-S2 Abdomen: Soft, nontender nondistended Extremities: No significant edema Neuro: Alert oriented following commands  Chest x-ray independently reviewed shows decrease left effusion, left pigtail in place Labs show decreased leukocytosis, stable anemia, creatinine improving to 1.3, normal electrolytes, albumin 1.7  Pleural fluid results show predominant neutrophilic exudate Pleural fluid culture  no growth  Resolved Hospital Problem list     Assessment & Plan:    Persistent left pleural effusion, exudative, suspected parapneumonic vs empyema COPD with ongoing tobacco use disorder  Plan: Second dose of intrapleural fibrinolytic therapy with alteplase and dornase Continue cefepime plus vancomycin Pending pleural fluid culture can likely de-escalate possibly to Augmentin. We will likely need a prolonged course of antibiotics. Usually would treat for 21 days of following improvement in imaging. We will likely plan for a third dose tomorrow and a repeat CT scan on Friday.   Labs   CBC: Recent Labs  Lab 06/14/22 0830 06/15/22 0621 06/15/22 0854 06/16/22 0153 06/17/22 0349 06/18/22 0631  WBC 24.8* 18.2* 16.8* 16.5* 14.0* 13.5*  NEUTROABS 20.1*  --  12.3* 11.9* 9.6*  --   HGB 9.5* 8.7* 9.1* 8.6* 8.7* 9.5*  HCT 29.6* 26.9* 27.2* 26.4* 26.6* 28.7*  MCV 89.4 89.1 87.2 87.4 86.9 86.4  PLT 670* 652* 630* 624* 551* 590*    Basic Metabolic Panel: Recent Labs  Lab 06/15/22 0621 06/15/22 0854 06/16/22 0153 06/17/22 0349 06/18/22 0631  NA 136 135 137 137 134*  K 4.2 4.3 4.5 4.2 3.7  CL 103 104 104 101 102  CO2 24 22 21* 25 23  GLUCOSE 83 89 80 89 85  BUN 18 18 17 17 13   CREATININE 1.65* 1.55* 1.38*  1.40* 1.29* 1.03  CALCIUM 8.1* 8.2* 8.1* 8.2* 8.0*  MG  --   --  1.7 1.7 1.7  PHOS  --   --  3.6 2.9 2.9  GFR: Estimated Creatinine Clearance: 79.9 mL/min (by C-G formula based on SCr of 1.03 mg/dL). Recent Labs  Lab 06/14/22 0850 06/14/22 1059 06/14/22 2052 06/15/22 0621 06/15/22 0854 06/16/22 0153 06/17/22 0349 06/18/22 0631  PROCALCITON  --   --  0.48  --   --   --   --   --   WBC  --   --   --    < > 16.8* 16.5* 14.0* 13.5*  LATICACIDVEN 2.5* 2.0* 1.4  --   --   --   --   --    < > = values in this interval not displayed.    Liver Function Tests: Recent Labs  Lab 06/14/22 0830 06/15/22 0621 06/15/22 0854 06/16/22 0153 06/17/22 0349  06/18/22 0631  AST 25 28 35  --   --   --   ALT 28 28 24   --   --   --   ALKPHOS 85 70 79  --   --   --   BILITOT 1.0 0.6 0.9  --   --   --   PROT 6.5 5.5* 5.5*  --   --   --   ALBUMIN 2.2* 1.7* 1.8* 1.8* 1.7* 1.8*   Recent Labs  Lab 06/14/22 0830  LIPASE 22   No results for input(s): "AMMONIA" in the last 168 hours.  ABG    Component Value Date/Time   HCO3 31.6 (H) 06/04/2022 2032   O2SAT 30.9 06/04/2022 2032     Coagulation Profile: No results for input(s): "INR", "PROTIME" in the last 168 hours.  Cardiac Enzymes: No results for input(s): "CKTOTAL", "CKMB", "CKMBINDEX", "TROPONINI" in the last 168 hours.  HbA1C: Hgb A1c MFr Bld  Date/Time Value Ref Range Status  06/05/2022 05:03 AM 5.4 4.8 - 5.6 % Final    Comment:    (NOTE) Pre diabetes:          5.7%-6.4%  Diabetes:              >6.4%  Glycemic control for   <7.0% adults with diabetes     CBG: Recent Labs  Lab 06/14/22 0817  GLUCAP 131*     08/14/22, DO [facility] 06/18/2022 8:35 AM

## 2022-06-18 NOTE — Progress Notes (Signed)
Mobility Specialist Criteria Algorithm Info.   06/18/22 1604  Mobility  Activity Ambulated with assistance in hallway  Range of Motion/Exercises Active;All extremities  Level of Assistance Independent after set-up  Assistive Device Other (Comment) (IV Pole)  Distance Ambulated (ft) 300 ft  Activity Response Tolerated well   Patient received in supine reluctant initially but agreeable to participate in mobility with encouragement from family. Ambulated independently with slow gait. Required standing rest break x1 second to severe 10/10 pain at operative site. Returned to room without incident. Was left in chair with all needs met, call bell in reach. RN notified.  [place] Ely Spragg, [place], [place]  MWUXL:[phone] Office: [phone]

## 2022-06-19 ENCOUNTER — Inpatient Hospital Stay (HOSPITAL_COMMUNITY): Payer: Medicare PPO

## 2022-06-19 DIAGNOSIS — J189 Pneumonia, unspecified organism: Secondary | ICD-10-CM | POA: Diagnosis not present

## 2022-06-19 DIAGNOSIS — N179 Acute kidney failure, unspecified: Secondary | ICD-10-CM | POA: Diagnosis not present

## 2022-06-19 DIAGNOSIS — D72825 Bandemia: Secondary | ICD-10-CM | POA: Diagnosis not present

## 2022-06-19 DIAGNOSIS — J90 Pleural effusion, not elsewhere classified: Secondary | ICD-10-CM | POA: Diagnosis not present

## 2022-06-19 LAB — RENAL FUNCTION PANEL
Albumin: 1.7 g/dL — ABNORMAL LOW (ref 3.5–5.0)
Anion gap: 8 (ref 5–15)
BUN: 12 mg/dL (ref 8–23)
CO2: 24 mmol/L (ref 22–32)
Calcium: 8.1 mg/dL — ABNORMAL LOW (ref 8.9–10.3)
Chloride: 99 mmol/L (ref 98–111)
Creatinine, Ser: 1.02 mg/dL (ref 0.61–1.24)
GFR, Estimated: >60 mL/min (ref >60)
Glucose, Bld: 104 mg/dL — ABNORMAL HIGH (ref 70–99)
Phosphorus: 3 mg/dL (ref 2.5–4.6)
Potassium: 4.1 mmol/L (ref 3.5–5.1)
Sodium: 131 mmol/L — ABNORMAL LOW (ref 135–145)

## 2022-06-19 LAB — CYTOLOGY - NON PAP

## 2022-06-19 LAB — CBC
HCT: 27.5 % — ABNORMAL LOW (ref 39.0–52.0)
Hemoglobin: 9.1 g/dL — ABNORMAL LOW (ref 13.0–17.0)
MCH: 28.5 pg (ref 26.0–34.0)
MCHC: 33.1 g/dL (ref 30.0–36.0)
MCV: 86.2 fL (ref 80.0–100.0)
Platelets: 566 10*3/uL — ABNORMAL HIGH (ref 150–400)
RBC: 3.19 MIL/uL — ABNORMAL LOW (ref 4.22–5.81)
RDW: 17.5 % — ABNORMAL HIGH (ref 11.5–15.5)
WBC: 16 10*3/uL — ABNORMAL HIGH (ref 4.0–10.5)
nRBC: 0 % (ref 0.0–0.2)

## 2022-06-19 LAB — MAGNESIUM: Magnesium: 1.7 mg/dL (ref 1.7–2.4)

## 2022-06-19 LAB — CULTURE, BLOOD (ROUTINE X 2)
Culture: NO GROWTH
Culture: NO GROWTH
Special Requests: ADEQUATE

## 2022-06-19 MED ORDER — STERILE WATER FOR INJECTION IJ SOLN
5.0000 mg | Freq: Once | RESPIRATORY_TRACT | Status: AC
  Administered 2022-06-19: 5 mg via INTRAPLEURAL
  Filled 2022-06-19: qty 5

## 2022-06-19 MED ORDER — SODIUM CHLORIDE (PF) 0.9 % IJ SOLN
10.0000 mg | Freq: Once | INTRAMUSCULAR | Status: AC
  Administered 2022-06-19: 10 mg via INTRAPLEURAL
  Filled 2022-06-19: qty 10

## 2022-06-19 NOTE — Progress Notes (Signed)
Chest tube catheter unclamped at 1005 per Brett Canales Minor, NP.

## 2022-06-19 NOTE — Progress Notes (Signed)
Mobility Specialist - Progress Note   06/19/22 1136  Mobility  Activity Stood at bedside  Level of Assistance Standby assist, set-up cues, supervision of patient - no hands on  Assistive Device Other (Comment) (bed railing)  Activity Response Tolerated well  $Mobility charge 1 Mobility    Pre-mobility: 95 HR,94/62 BP,94% SpO2 During mobility: 116 HR Post-mobility: 116: HR, 107/66 BP  Pt was received in bed and agreeable to mobility. Pt mobility limited to chest tube being on suction. Pt did x2 sets of 10 marching in place and x2 10 sets of calf raises. Pt was returned back to bed with all needs met.   Larey Seat

## 2022-06-19 NOTE — Progress Notes (Signed)
PROGRESS NOTE  David Callahan WPY:[id number] DOB: January 21, 1956   PCP: Patient, No Pcp Per  Patient is from: Home.  Lives alone.  Independently ambulates at baseline.  DOA: 06/14/2022 LOS: 5  Chief complaints Chief Complaint  Patient presents with   Weakness   Dizziness     Brief Narrative / Interim history: 66 year old M with PMH of COPD, HTN, tobacco use disorder, recurrent pleural effusion, recent hospitalization from 8/23-28 for CAP/exudative pleural effusion s/p thoracocentesis, and AKI returning to [facility] with increased shortness of breath, dizziness and low blood pressure, and admitted for recurrent exudative left pleural effusion.  CT chest showed moderate left loculated pleural effusion with gas.  He has WBC of 25 with left shift.  Creatinine 2.4 (1.3 on discharge recently).  Lactic acid 2.5.  Patient was started on vancomycin and cefepime.  Pulmonology consulted.  Patient was transferred to [facility] for further evaluation and management.  IR consulted and had CT-guided chest tube placed on 9/4 with removal of 700 cc exudative and cultures negative fluid right away.  Received fibrinolytics on 9/5, 9/6 and 9/7.  PCCM following.  Remains on broad-spectrum antibiotics.  Plan for repeat CT chest on 9/8.    Subjective: Seen and examined earlier this morning.  No major events overnight of this morning.  No complaints other than intermittent left chest pain from chest tube.  Denies shortness of breath.  Denies GI or UTI symptoms.  Objective: Vitals:   06/18/22 2000 06/18/22 2052 06/19/22 0559 06/19/22 0800  BP:  119/82 (!) 111/59 129/74  Pulse:  89 67 72  Resp:  16 16 18   Temp:  (!) 96.8 F (36 C) 97.9 F (36.6 C) 98 F (36.7 C)  TempSrc:  Axillary Oral Oral  SpO2: 95% 97% 96%   Weight:   87.9 kg   Height:        Examination: GENERAL: No apparent distress.  Nontoxic. HEENT: MMM.  Vision and hearing grossly intact.  NECK: Supple.  No apparent JVD.  RESP:  No  IWOB.  Diminished aeration over LLL.  Chest tube to left chest. CVS:  RRR. Heart sounds normal.  ABD/GI/GU: BS+. Abd soft, NTND.  MSK/EXT:  Moves extremities. No apparent deformity. No edema.  SKIN: no apparent skin lesion or wound NEURO: Awake and alert. Oriented appropriately.  No apparent focal neuro deficit. PSYCH: Calm. Normal affect.   Procedures:  9/4-image guided left chest tube placement  Microbiology summarized: COVID-19 PCR nonreactive. Full RVP nonreactive. Blood cultures NGTD. MRSA PCR screen positive. Pleural fluid culture NGTD.  Assessment and plan: Principal Problem:   Pleural effusion Active Problems:   COPD (chronic obstructive pulmonary disease) (HCC)   Leukocytosis   CAP (community acquired pneumonia)   AKI (acute kidney injury) (HCC)  Community-acquired pneumonia with recurrent pleural effusion concerning for parapneumonic effusion-POA.  Hospitalized 8/23-28 and had thoracocentesis with removal of exudative and cultures negative fluid.  He was discharged on Levaquin.  Returns with SOB, hypotension and near syncope.  Had leukocytosis to 25 with left shift.  Lactic acidosis of 2.5.  CT chest as above.  MRSA PCR screen positive.  RVP and COVID-19 PCR negative.  Blood and pleural fluid cultures negative.  -CT-guided chest tube by IR on 9/4.  About 225 cc from chest tube in the last 24 hours. -Repeat CXR with small loculated left pleural effusion increased in size -Received third dose of fibrinolytics on 9/7. -Continue vancomycin, cefepime and Flagyl -Follow QuantiFERON gold -Pulmonary toilet -PCCM following-plan  for repeat CT chest on 9/8  Chronic COPD/tobacco abuse- -Continue bronchodilators and nicotine patch -Encouraged smoking cessation.   Hypotension/history of hypertension: Hypotension resolved.   -Continue holding home benazepril in the setting of AKI and Cardizem in the setting of borderline heart rate -Start low-dose amlodipine  AKI: Likely  prerenal in the setting of sepsis and hypotension.  Resolved. Recent Labs    06/05/22 0503 06/06/22 1200 06/07/22 0419 06/14/22 0830 06/15/22 0621 06/15/22 0854 06/16/22 0153 06/17/22 0349 06/18/22 0631 06/19/22 0254  BUN 35* 30* 27* 30* 18 18 17 17 13 12   CREATININE 1.72* 1.31* 1.27* 2.40* 1.65* 1.55* 1.38*  1.40* 1.29* 1.03 1.02  -Continue monitoring -Avoid nephrotoxic meds  Anemia of chronic disease with iron deficiency: Drop in Hgb likely dilutional from IVF.  Iron saturation 7%. Recent Labs    06/05/22 0503 06/06/22 1200 06/07/22 0419 06/14/22 0830 06/15/22 0621 06/15/22 0854 06/16/22 0153 06/17/22 0349 06/18/22 0631 06/19/22 0254  HGB 10.5* 9.9* 10.0* 9.5* 8.7* 9.1* 8.6* 8.7* 9.5* 9.1*  -Monitor H&H  Thrombocytosis: Likely reactive. -Monitor.  Bandemia: Likely due to #1.  Slightly worse today -Continue monitoring  Lactic acidosis: Likely due to #1.  Resolved.  Restless leg syndrome -continue Requip  Sepsis ruled out.   Body mass index is 27.81 kg/m.           DVT prophylaxis:  heparin injection 5,000 Units Start: 06/14/22 2200 SCDs Start: 06/14/22 2116  Code Status: Full code Family Communication: None at bedside Level of care: Telemetry Medical Status is: Inpatient Remains inpatient appropriate because: Parapneumonic effusion   Final disposition: Likely home once medically cleared Consultants:  Pulmonology Interventional radiology  Sch Meds:  Scheduled Meds:  alteplase (CATHFLO ACTIVASE) 10 mg in sodium chloride (PF) 0.9 % 30 mL  10 mg Intrapleural Once   And   dornase alfa (PULMOZYME) 5 mg in sterile water (preservative free) 30 mL  5 mg Intrapleural Once   amLODipine  5 mg Oral Daily   atorvastatin  40 mg Oral Daily   cyclobenzaprine  10 mg Oral QHS   dextromethorphan-guaiFENesin  1 tablet Oral BID   fluticasone furoate-vilanterol  1 puff Inhalation Daily   And   umeclidinium bromide  1 puff Inhalation Daily   heparin   5,000 Units Subcutaneous Q8H   multivitamin with minerals  1 tablet Oral Daily   nicotine  21 mg Transdermal Daily   rOPINIRole  0.75 mg Oral QHS   sodium chloride flush  10 mL Intrapleural Q8H   sodium chloride flush  3 mL Intravenous Q12H   traZODone  50 mg Oral QHS   Continuous Infusions:  sodium chloride 125 mL/hr at 06/19/22 0853   ceFEPime (MAXIPIME) IV 2 g (06/19/22 0517)   metronidazole 500 mg (06/19/22 0903)   vancomycin 1,000 mg (06/19/22 1107)   PRN Meds:.acetaminophen **OR** acetaminophen, albuterol, bisacodyl, HYDROcodone-acetaminophen, ondansetron **OR** ondansetron (ZOFRAN) IV, polyethylene glycol, traZODone  Antimicrobials: Anti-infectives (From admission, onward)    Start     Dose/Rate Route Frequency Ordered Stop   06/19/22 0600  vancomycin (VANCOCIN) IVPB 1000 mg/200 mL premix  Status:  Discontinued        1,000 mg 200 mL/hr over 60 Minutes Intravenous Every 12 hours 06/18/22 1110 06/18/22 1152   06/18/22 1400  ceFEPIme (MAXIPIME) 2 g in sodium chloride 0.9 % 100 mL IVPB        2 g 200 mL/hr over 30 Minutes Intravenous Every 8 hours 06/18/22 1110     06/18/22 1300  vancomycin (VANCOCIN) IVPB 1000 mg/200 mL premix        1,000 mg 200 mL/hr over 60 Minutes Intravenous Every 12 hours 06/18/22 1152     06/16/22 1100  vancomycin (VANCOREADY) IVPB 1500 mg/300 mL  Status:  Discontinued        1,500 mg 150 mL/hr over 120 Minutes Intravenous Every 24 hours 06/16/22 0814 06/18/22 1110   06/16/22 0845  metroNIDAZOLE (FLAGYL) IVPB 500 mg        500 mg 100 mL/hr over 60 Minutes Intravenous Every 12 hours 06/16/22 0745 06/26/22 0800   06/15/22 1345  ceFAZolin (ANCEF) IVPB 2g/100 mL premix        2 g 200 mL/hr over 30 Minutes Intravenous To Radiology 06/15/22 1248 06/16/22 1345   06/15/22 1100  vancomycin (VANCOCIN) IVPB 1000 mg/200 mL premix  Status:  Discontinued        1,000 mg 200 mL/hr over 60 Minutes Intravenous Every 24 hours 06/14/22 1011 06/16/22 0814   06/14/22  2200  ceFEPIme (MAXIPIME) 2 g in sodium chloride 0.9 % 100 mL IVPB  Status:  Discontinued        2 g 200 mL/hr over 30 Minutes Intravenous Every 12 hours 06/14/22 0943 06/18/22 1110   06/14/22 0945  vancomycin (VANCOREADY) IVPB 2000 mg/400 mL        2,000 mg 200 mL/hr over 120 Minutes Intravenous  Once 06/14/22 0934 06/14/22 1212   06/14/22 0930  vancomycin (VANCOCIN) IVPB 1000 mg/200 mL premix  Status:  Discontinued        1,000 mg 200 mL/hr over 60 Minutes Intravenous  Once 06/14/22 0925 06/14/22 0934   06/14/22 0930  ceFEPIme (MAXIPIME) 2 g in sodium chloride 0.9 % 100 mL IVPB        2 g 200 mL/hr over 30 Minutes Intravenous  Once 06/14/22 0925 06/14/22 1017        I have personally reviewed the following labs and images: CBC: Recent Labs  Lab 06/14/22 0830 06/15/22 0621 06/15/22 0854 06/16/22 0153 06/17/22 0349 06/18/22 0631 06/19/22 0254  WBC 24.8*   < > 16.8* 16.5* 14.0* 13.5* 16.0*  NEUTROABS 20.1*  --  12.3* 11.9* 9.6*  --   --   HGB 9.5*   < > 9.1* 8.6* 8.7* 9.5* 9.1*  HCT 29.6*   < > 27.2* 26.4* 26.6* 28.7* 27.5*  MCV 89.4   < > 87.2 87.4 86.9 86.4 86.2  PLT 670*   < > 630* 624* 551* 590* 566*   < > = values in this interval not displayed.   BMP &GFR Recent Labs  Lab 06/15/22 0854 06/16/22 0153 06/17/22 0349 06/18/22 0631 06/19/22 0254  NA 135 137 137 134* 131*  K 4.3 4.5 4.2 3.7 4.1  CL 104 104 101 102 99  CO2 22 21* 25 23 24   GLUCOSE 89 80 89 85 104*  BUN 18 17 17 13 12   CREATININE 1.55* 1.38*  1.40* 1.29* 1.03 1.02  CALCIUM 8.2* 8.1* 8.2* 8.0* 8.1*  MG  --  1.7 1.7 1.7 1.7  PHOS  --  3.6 2.9 2.9 3.0   Estimated Creatinine Clearance: 80.7 mL/min (by C-G formula based on SCr of 1.02 mg/dL). Liver & Pancreas: Recent Labs  Lab 06/14/22 0830 06/15/22 0621 06/15/22 0854 06/16/22 0153 06/17/22 0349 06/18/22 0631 06/19/22 0254  AST 25 28 35  --   --   --   --   ALT 28 28 24   --   --   --   --  ALKPHOS 85 70 79  --   --   --   --   BILITOT  1.0 0.6 0.9  --   --   --   --   PROT 6.5 5.5* 5.5*  --   --   --   --   ALBUMIN 2.2* 1.7* 1.8* 1.8* 1.7* 1.8* 1.7*   Recent Labs  Lab 06/14/22 0830  LIPASE 22   No results for input(s): "AMMONIA" in the last 168 hours. Diabetic: No results for input(s): "HGBA1C" in the last 72 hours. Recent Labs  Lab 06/14/22 0817  GLUCAP 131*   Cardiac Enzymes: No results for input(s): "CKTOTAL", "CKMB", "CKMBINDEX", "TROPONINI" in the last 168 hours. No results for input(s): "PROBNP" in the last 8760 hours. Coagulation Profile: No results for input(s): "INR", "PROTIME" in the last 168 hours. Thyroid Function Tests: No results for input(s): "TSH", "T4TOTAL", "FREET4", "T3FREE", "THYROIDAB" in the last 72 hours. Lipid Profile: No results for input(s): "CHOL", "HDL", "LDLCALC", "TRIG", "CHOLHDL", "LDLDIRECT" in the last 72 hours. Anemia Panel: Recent Labs    06/17/22 0349  FERRITIN 233   Urine analysis:    Component Value Date/Time   COLORURINE YELLOW 06/14/2022 1210   APPEARANCEUR CLEAR 06/14/2022 1210   LABSPEC 1.010 06/14/2022 1210   PHURINE 6.0 06/14/2022 1210   GLUCOSEU NEGATIVE 06/14/2022 1210   HGBUR SMALL (A) 06/14/2022 1210   BILIRUBINUR NEGATIVE 06/14/2022 1210   KETONESUR NEGATIVE 06/14/2022 1210   PROTEINUR NEGATIVE 06/14/2022 1210   NITRITE NEGATIVE 06/14/2022 1210   LEUKOCYTESUR NEGATIVE 06/14/2022 1210   Sepsis Labs: Invalid input(s): "PROCALCITONIN", "LACTICIDVEN"  Microbiology: Recent Results (from the past 240 hour(s))  Culture, blood (Routine X 2) w Reflex to ID Panel     Status: None   Collection Time: 06/14/22  8:50 AM   Specimen: Left Antecubital; Blood  Result Value Ref Range Status   Specimen Description   Final    LEFT ANTECUBITAL BOTTLES DRAWN AEROBIC AND ANAEROBIC   Special Requests Blood Culture adequate volume  Final   Culture   Final    NO GROWTH 5 DAYS Performed at [facility], [address], [place], [place] [address]    Report Status  06/19/2022 FINAL  Final  SARS Coronavirus 2 by RT PCR (hospital order, performed in [facility] hospital lab) *cepheid single result test* Anterior Nasal Swab     Status: None   Collection Time: 06/14/22  8:58 AM   Specimen: Anterior Nasal Swab  Result Value Ref Range Status   SARS Coronavirus 2 by RT PCR NEGATIVE NEGATIVE Final    Comment: (NOTE) SARS-CoV-2 target nucleic acids are NOT DETECTED.  The SARS-CoV-2 RNA is generally detectable in upper and lower respiratory specimens during the acute phase of infection. The lowest concentration of SARS-CoV-2 viral copies this assay can detect is 250 copies / mL. A negative result does not preclude SARS-CoV-2 infection and should not be used as the sole basis for treatment or other patient management decisions.  A negative result may occur with improper specimen collection / handling, submission of specimen other than nasopharyngeal swab, presence of viral mutation(s) within the areas targeted by this assay, and inadequate number of viral copies (<250 copies / mL). A negative result must be combined with clinical observations, patient history, and epidemiological information.  Fact Sheet for Patients:   RoadLapTop.co.za  Fact Sheet for Healthcare Providers: http://kim-miller.com/  This test is not yet approved or  cleared by the [place] FDA and has been authorized for  detection and/or diagnosis of SARS-CoV-2 by FDA under an Emergency Use Authorization (EUA).  This EUA will remain in effect (meaning this test can be used) for the duration of the COVID-19 declaration under Section 564(b)(1) of the Act, 21 U.S.C. section 360bbb-3(b)(1), unless the authorization is terminated or revoked sooner.  Performed at [facility], [address] [place], [place] [address]   Culture, blood (Routine X 2) w Reflex to ID Panel     Status: None   Collection Time: 06/14/22  9:09 AM   Specimen: BLOOD  RIGHT HAND  Result Value Ref Range Status   Specimen Description   Final    BLOOD RIGHT HAND BOTTLES DRAWN AEROBIC AND ANAEROBIC   Special Requests   Final    Blood Culture results may not be optimal due to an excessive volume of blood received in culture bottles   Culture   Final    NO GROWTH 5 DAYS Performed at [facility], [address], [place], [place] [address]    Report Status 06/19/2022 FINAL  Final  MRSA Next Gen by PCR, Nasal     Status: Abnormal   Collection Time: 06/14/22  9:45 AM   Specimen: Nasal Mucosa; Nasal Swab  Result Value Ref Range Status   MRSA by PCR Next Gen DETECTED (A) NOT DETECTED Final    Comment: RESULT CALLED TO, READ BACK BY AND VERIFIED WITH: EANES M @ 1138 ON [id number] BY HENDERSON L (NOTE) The GeneXpert MRSA Assay (FDA approved for NASAL specimens only), is one component of a comprehensive MRSA colonization surveillance program. It is not intended to diagnose MRSA infection nor to guide or monitor treatment for MRSA infections. Test performance is not FDA approved in patients less than 34 years old. Performed at [facility], [address] [place], [place] [address]   Respiratory (~20 pathogens) panel by PCR     Status: None   Collection Time: 06/14/22  9:30 PM   Specimen: Nasopharyngeal Swab; Respiratory  Result Value Ref Range Status   Adenovirus NOT DETECTED NOT DETECTED Final   Coronavirus 229E NOT DETECTED NOT DETECTED Final    Comment: (NOTE) The Coronavirus on the Respiratory Panel, DOES NOT test for the novel  Coronavirus (2019 nCoV)    Coronavirus HKU1 NOT DETECTED NOT DETECTED Final   Coronavirus NL63 NOT DETECTED NOT DETECTED Final   Coronavirus OC43 NOT DETECTED NOT DETECTED Final   Metapneumovirus NOT DETECTED NOT DETECTED Final   Rhinovirus / Enterovirus NOT DETECTED NOT DETECTED Final   Influenza A NOT DETECTED NOT DETECTED Final   Influenza B NOT DETECTED NOT DETECTED Final   Parainfluenza Virus 1 NOT DETECTED NOT DETECTED  Final   Parainfluenza Virus 2 NOT DETECTED NOT DETECTED Final   Parainfluenza Virus 3 NOT DETECTED NOT DETECTED Final   Parainfluenza Virus 4 NOT DETECTED NOT DETECTED Final   Respiratory Syncytial Virus NOT DETECTED NOT DETECTED Final   Bordetella pertussis NOT DETECTED NOT DETECTED Final   Bordetella Parapertussis NOT DETECTED NOT DETECTED Final   Chlamydophila pneumoniae NOT DETECTED NOT DETECTED Final   Mycoplasma pneumoniae NOT DETECTED NOT DETECTED Final    Comment: Performed at [facility], [address], [place], [place] [address]  Aerobic/Anaerobic Culture w Gram Stain (surgical/deep wound)     Status: None (Preliminary result)   Collection Time: 06/16/22  1:14 PM   Specimen: Lung  Result Value Ref Range Status   Specimen Description LUNG  Final   Special Requests NONE  Final  Gram Stain   Final    RARE WBC PRESENT,BOTH PMN AND MONONUCLEAR NO ORGANISMS SEEN    Culture   Final    NO GROWTH 3 DAYS NO ANAEROBES ISOLATED; CULTURE IN PROGRESS FOR 5 DAYS Performed at [facility], [address], [place], [place] [address]    Report Status PENDING  Incomplete  Body fluid culture w Gram Stain     Status: None (Preliminary result)   Collection Time: 06/16/22  4:55 PM   Specimen: Pleural Fluid  Result Value Ref Range Status   Specimen Description PLEURAL  Final   Special Requests NONE  Final   Gram Stain NO WBC SEEN NO ORGANISMS SEEN   Final   Culture   Final    NO GROWTH 3 DAYS Performed at [facility], [address] [place], [place] [address]    Report Status PENDING  Incomplete    Radiology Studies: DG CHEST PORT 1 VIEW  Result Date: 06/19/2022 CLINICAL DATA:  Left chest tube EXAM: PORTABLE CHEST 1 VIEW COMPARISON:  Chest x-ray dated June 18, 2022 FINDINGS: Cardiac and mediastinal contours are within normal limits. Small loculated left pleural effusion, increased in size when compared with prior exam. Left-sided chest tube in place. No  evidence of pneumothorax. Left basilar lung opacities, likely due to atelectasis. IMPRESSION: Small loculated left pleural effusion, increased in size. Left-sided chest tube in place. Electronically Signed   By: Allegra Lai M.D.   On: 06/19/2022 08:26      Marialuisa Basara T. Venise Ellingwood [facility]  If 7PM-7AM, please contact night-coverage [email or web address] 06/19/2022, 2:22 PM

## 2022-06-19 NOTE — Progress Notes (Signed)
NAME:  David Callahan, MRN:  [id number], DOB:  06/12/1956, LOS: 5 ADMISSION DATE:  06/14/2022, CONSULTATION DATE:  06/14/2022 REFERRING MD:  Almon Hercules, MD, CHIEF COMPLAINT:  pleural effusion  History of Present Illness:  75 y9o man with history of COPD and ongoing tobacco use disorder. Recent hospitalization for COPD exacerbation and left pleural effusion in August 2023. This was drained by IR, but not fully drained - was exudative. He was discharged home on abx 8/26 and represented to the ED at [facility] 9/2 for worsening shortness of breath. Found to have persistent let pleural effusion,   Pertinent  Medical History  Copd Tobacco use disorder  Significant Hospital Events: Including procedures, antibiotic start and stop dates in addition to other pertinent events   9/4 CT guided pigtail by IR Plan for CT without contrast  Interim History / Subjective:  No acute distress, third dose of lytics instilled 06/20/2019  Objective   Blood pressure 129/74, pulse 72, temperature 98 F (36.7 C), temperature source Oral, resp. rate 18, height 5\' 10"  (1.778 m), weight 87.9 kg, SpO2 96 %.        Intake/Output Summary (Last 24 hours) at 06/19/2022 0913 Last data filed at 06/19/2022 0535 Gross per 24 hour  Intake 1965 ml  Output 2375 ml  Net -410 ml   Filed Weights   06/16/22 0421 06/18/22 0500 06/19/22 0559  Weight: 88.7 kg 88 kg 87.9 kg    Examination: General: Well-nourished well-developed no acute distress HEENT: MM pink/moist no JVD or lymphadenopathy Neuro: Without focal defect CV: Heart sounds regular PULM: Diminished in left base chest tube drainage 800 cc / 24 hours GI: soft, bsx4 active  GU: Extr negative emities: warm/dry,  edema  Skin: no rashes or lesions   Chest x-ray without noticeable Greater than 800 cc of chest tube drainage in 24 hours  Resolved Hospital Problem list     Assessment & Plan:    Persistent left pleural effusion, exudative, suspected parapneumonic vs  empyema COPD with ongoing tobacco use disorder  Plan: Third dose lytics 03/19/22 0900 CT scan in am wo contrast.  Will order tomorrow. Continue antibiotics consider decreasing to Augmentin for prolonged course of approximately 21 days        Labs   CBC: Recent Labs  Lab 06/14/22 0830 06/15/22 0621 06/15/22 0854 06/16/22 0153 06/17/22 0349 06/18/22 0631 06/19/22 0254  WBC 24.8*   < > 16.8* 16.5* 14.0* 13.5* 16.0*  NEUTROABS 20.1*  --  12.3* 11.9* 9.6*  --   --   HGB 9.5*   < > 9.1* 8.6* 8.7* 9.5* 9.1*  HCT 29.6*   < > 27.2* 26.4* 26.6* 28.7* 27.5*  MCV 89.4   < > 87.2 87.4 86.9 86.4 86.2  PLT 670*   < > 630* 624* 551* 590* 566*   < > = values in this interval not displayed.    Basic Metabolic Panel: Recent Labs  Lab 06/15/22 0854 06/16/22 0153 06/17/22 0349 06/18/22 0631 06/19/22 0254  NA 135 137 137 134* 131*  K 4.3 4.5 4.2 3.7 4.1  CL 104 104 101 102 99  CO2 22 21* 25 23 24   GLUCOSE 89 80 89 85 104*  BUN 18 17 17 13 12   CREATININE 1.55* 1.38*  1.40* 1.29* 1.03 1.02  CALCIUM 8.2* 8.1* 8.2* 8.0* 8.1*  MG  --  1.7 1.7 1.7 1.7  PHOS  --  3.6 2.9 2.9 3.0   GFR: Estimated Creatinine Clearance: 80.7 mL/min (  by C-G formula based on SCr of 1.02 mg/dL). Recent Labs  Lab 06/14/22 0850 06/14/22 1059 06/14/22 2052 06/15/22 0621 06/16/22 0153 06/17/22 0349 06/18/22 0631 06/19/22 0254  PROCALCITON  --   --  0.48  --   --   --   --   --   WBC  --   --   --    < > 16.5* 14.0* 13.5* 16.0*  LATICACIDVEN 2.5* 2.0* 1.4  --   --   --   --   --    < > = values in this interval not displayed.    Liver Function Tests: Recent Labs  Lab 06/14/22 0830 06/15/22 0621 06/15/22 0854 06/16/22 0153 06/17/22 0349 06/18/22 0631 06/19/22 0254  AST 25 28 35  --   --   --   --   ALT 28 28 24   --   --   --   --   ALKPHOS 85 70 79  --   --   --   --   BILITOT 1.0 0.6 0.9  --   --   --   --   PROT 6.5 5.5* 5.5*  --   --   --   --   ALBUMIN 2.2* 1.7* 1.8* 1.8* 1.7* 1.8*  1.7*   Recent Labs  Lab 06/14/22 0830  LIPASE 22   No results for input(s): "AMMONIA" in the last 168 hours.  ABG    Component Value Date/Time   HCO3 31.6 (H) 06/04/2022 2032   O2SAT 30.9 06/04/2022 2032     Coagulation Profile: No results for input(s): "INR", "PROTIME" in the last 168 hours.  Cardiac Enzymes: No results for input(s): "CKTOTAL", "CKMB", "CKMBINDEX", "TROPONINI" in the last 168 hours.  HbA1C: Hgb A1c MFr Bld  Date/Time Value Ref Range Status  06/05/2022 05:03 AM 5.4 4.8 - 5.6 % Final    Comment:    (NOTE) Pre diabetes:          5.7%-6.4%  Diabetes:              >6.4%  Glycemic control for   <7.0% adults with diabetes     CBG: Recent Labs  Lab 06/14/22 0817  GLUCAP 131*     Steve Enis Leatherwood ACNP Acute Care Nurse Practitioner 08/14/22 Pulmonary/Critical Care Please consult Amion 06/19/2022, 9:13 AM

## 2022-06-19 NOTE — Procedures (Signed)
Pleural Fibrinolytic Administration Procedure Note  David Callahan  [id number]  Mar 11, 1956  Date:06/19/22  Time:9:22 AM   Provider Performing:Dimitry Holsworth   Procedure: Pleural Fibrinolysis Subsequent day [phone]) third dose  Indication(s) Fibrinolysis of complicated pleural effusion  Consent Risks of the procedure as well as the alternatives and risks of each were explained to the patient and/or caregiver.  Consent for the procedure was obtained.   Anesthesia None   Time Out Verified patient identification, verified procedure, site/side was marked, verified correct patient position, special equipment/implants available, medications/allergies/relevant history reviewed, required imaging and test results available.   Sterile Technique Hand hygiene, gloves   Procedure Description Existing pleural catheter was cleaned and accessed in sterile manner.  10mg  of tPA in 30cc of saline and 5mg  of dornase in 30cc of sterile water were injected into pleural space using existing pleural catheter.  Catheter will be clamped for 1 hour and then placed back to suction.   Complications/Tolerance None; patient tolerated the procedure well.  EBL None   Specimen(s) None   David Callahan ACNP Acute Care Nurse Practitioner Pulmonary/Critical Care Please consult Amion 06/19/2022, 9:23 AM

## 2022-06-20 ENCOUNTER — Inpatient Hospital Stay (HOSPITAL_COMMUNITY): Payer: Medicare PPO

## 2022-06-20 DIAGNOSIS — J449 Chronic obstructive pulmonary disease, unspecified: Secondary | ICD-10-CM | POA: Diagnosis not present

## 2022-06-20 DIAGNOSIS — J869 Pyothorax without fistula: Secondary | ICD-10-CM | POA: Diagnosis not present

## 2022-06-20 DIAGNOSIS — J90 Pleural effusion, not elsewhere classified: Secondary | ICD-10-CM | POA: Diagnosis not present

## 2022-06-20 DIAGNOSIS — J189 Pneumonia, unspecified organism: Secondary | ICD-10-CM | POA: Diagnosis not present

## 2022-06-20 DIAGNOSIS — N179 Acute kidney failure, unspecified: Secondary | ICD-10-CM | POA: Diagnosis not present

## 2022-06-20 LAB — RENAL FUNCTION PANEL
Albumin: 1.7 g/dL — ABNORMAL LOW (ref 3.5–5.0)
Anion gap: 8 (ref 5–15)
BUN: 11 mg/dL (ref 8–23)
CO2: 24 mmol/L (ref 22–32)
Calcium: 8.1 mg/dL — ABNORMAL LOW (ref 8.9–10.3)
Chloride: 103 mmol/L (ref 98–111)
Creatinine, Ser: 0.99 mg/dL (ref 0.61–1.24)
GFR, Estimated: >60 mL/min (ref >60)
Glucose, Bld: 82 mg/dL (ref 70–99)
Phosphorus: 2.9 mg/dL (ref 2.5–4.6)
Potassium: 4.1 mmol/L (ref 3.5–5.1)
Sodium: 135 mmol/L (ref 135–145)

## 2022-06-20 LAB — CBC WITH DIFFERENTIAL/PLATELET
Abs Immature Granulocytes: 0.09 10*3/uL — ABNORMAL HIGH (ref 0.00–0.07)
Basophils Absolute: 0.1 10*3/uL (ref 0.0–0.1)
Basophils Relative: 1 %
Eosinophils Absolute: 0.2 10*3/uL (ref 0.0–0.5)
Eosinophils Relative: 1 %
HCT: 28.4 % — ABNORMAL LOW (ref 39.0–52.0)
Hemoglobin: 9.4 g/dL — ABNORMAL LOW (ref 13.0–17.0)
Immature Granulocytes: 1 %
Lymphocytes Relative: 27 %
Lymphs Abs: 3.3 10*3/uL (ref 0.7–4.0)
MCH: 28.7 pg (ref 26.0–34.0)
MCHC: 33.1 g/dL (ref 30.0–36.0)
MCV: 86.6 fL (ref 80.0–100.0)
Monocytes Absolute: 1.2 10*3/uL — ABNORMAL HIGH (ref 0.1–1.0)
Monocytes Relative: 9 %
Neutro Abs: 7.7 10*3/uL (ref 1.7–7.7)
Neutrophils Relative %: 61 %
Platelets: 652 10*3/uL — ABNORMAL HIGH (ref 150–400)
RBC: 3.28 MIL/uL — ABNORMAL LOW (ref 4.22–5.81)
RDW: 17.6 % — ABNORMAL HIGH (ref 11.5–15.5)
WBC: 12.5 10*3/uL — ABNORMAL HIGH (ref 4.0–10.5)
nRBC: 0 % (ref 0.0–0.2)

## 2022-06-20 LAB — BODY FLUID CULTURE W GRAM STAIN
Culture: NO GROWTH
Gram Stain: NONE SEEN

## 2022-06-20 LAB — MAGNESIUM: Magnesium: 1.8 mg/dL (ref 1.7–2.4)

## 2022-06-20 MED ORDER — AMOXICILLIN-POT CLAVULANATE 875-125 MG PO TABS
1.0000 | ORAL_TABLET | Freq: Two times a day (BID) | ORAL | Status: DC
  Administered 2022-06-20 – 2022-06-23 (×7): 1 via ORAL
  Filled 2022-06-20 (×7): qty 1

## 2022-06-20 NOTE — Progress Notes (Signed)
Mobility Specialist - Progress Note   06/20/22 1353  Mobility  Activity Refused mobility    Pt refused mobility stating that he's been walking in the room and doesn't need to walk in the hall today. Will follow up if time permits.  David Callahan

## 2022-06-20 NOTE — Consult Note (Signed)
Reason for Consult:pleural effusion Referring Physician: Dr. Katrinka Blazing- CCM  David Callahan is an 66 y.o. male.  HPI: 66 yo man with exudative parapneumonic pleural effusion  David Callahan is a 66 year old man with a past history of tobacco abuse, COPD, hypertension, hyperlipidemia, and multiple pneumonias.He had pneumonia in June treated with antibiotics.  Presented with progressive shortness of breath to [facility] in late August. Noted to have pneumonia and a left pleural effusion.  He was treated with antibiotics.  Thoracentesis was c/w an exudate.  Presented back on 06/14/2022 with SOB, dizziness and hypotension.  WBC elevated at 25K.  Ct showed a moderate left effusion with areas of gas post thoracentesis.  He was started on IV antibiotics and transferred to [facility].  IR placed a drain and he was given thrombolytics.  Symptoms have improved since admission.  Repeat CXR shows a trapped lung with a relatively small space.  Currently no complaints except pain at tube site with certain movements.  Past Medical History:  Diagnosis Date   Asthma    COPD (chronic obstructive pulmonary disease) (HCC)     Past Surgical History:  Procedure Laterality Date   KNEE SURGERY      History reviewed. No pertinent family history.  Social History:  reports that he has been smoking cigarettes. He has been smoking an average of .5 packs per day. He has never used smokeless tobacco. He reports current alcohol use. He reports that he does not use drugs.  Allergies:  Allergies  Allergen Reactions   Motrin [Ibuprofen] Swelling    Anti-inflammatory analgesics- cause anaphylaxis    Medications: Scheduled:  amLODipine  5 mg Oral Daily   amoxicillin-clavulanate  1 tablet Oral Q12H   atorvastatin  40 mg Oral Daily   cyclobenzaprine  10 mg Oral QHS   dextromethorphan-guaiFENesin  1 tablet Oral BID   fluticasone furoate-vilanterol  1 puff Inhalation Daily   And   umeclidinium bromide  1 puff Inhalation Daily    heparin  5,000 Units Subcutaneous Q8H   multivitamin with minerals  1 tablet Oral Daily   nicotine  21 mg Transdermal Daily   rOPINIRole  0.75 mg Oral QHS   sodium chloride flush  10 mL Intrapleural Q8H   sodium chloride flush  3 mL Intravenous Q12H   traZODone  50 mg Oral QHS    Results for orders placed or performed during the hospital encounter of 06/14/22 (from the past 48 hour(s))  Renal function panel     Status: Abnormal   Collection Time: 06/19/22  2:54 AM  Result Value Ref Range   Sodium 131 (L) 135 - 145 mmol/L   Potassium 4.1 3.5 - 5.1 mmol/L   Chloride 99 98 - 111 mmol/L   CO2 24 22 - 32 mmol/L   Glucose, Bld 104 (H) 70 - 99 mg/dL    Comment: Glucose reference range applies only to samples taken after fasting for at least 8 hours.   BUN 12 8 - 23 mg/dL   Creatinine, Ser 9.20 0.61 - 1.24 mg/dL   Calcium 8.1 (L) 8.9 - 10.3 mg/dL   Phosphorus 3.0 2.5 - 4.6 mg/dL   Albumin 1.7 (L) 3.5 - 5.0 g/dL   GFR, Estimated >10 >07 mL/min    Comment: (NOTE) Calculated using the CKD-EPI Creatinine Equation (2021)    Anion gap 8 5 - 15    Comment: Performed at [facility], [address], [place], [place] [address]  CBC  Status: Abnormal   Collection Time: 06/19/22  2:54 AM  Result Value Ref Range   WBC 16.0 (H) 4.0 - 10.5 K/uL   RBC 3.19 (L) 4.22 - 5.81 MIL/uL   Hemoglobin 9.1 (L) 13.0 - 17.0 g/dL   HCT 10.2 (L) 58.5 - 27.7 %   MCV 86.2 80.0 - 100.0 fL   MCH 28.5 26.0 - 34.0 pg   MCHC 33.1 30.0 - 36.0 g/dL   RDW 82.4 (H) 23.5 - 36.1 %   Platelets 566 (H) 150 - 400 K/uL   nRBC 0.0 0.0 - 0.2 %    Comment: Performed at [facility], [address], [place], [place] [address]  Magnesium     Status: None   Collection Time: 06/19/22  2:54 AM  Result Value Ref Range   Magnesium 1.7 1.7 - 2.4 mg/dL    Comment: Performed at [facility], [address] [place], [place] [address]  Renal function panel     Status: Abnormal   Collection Time: 06/20/22  5:05 AM   Result Value Ref Range   Sodium 135 135 - 145 mmol/L   Potassium 4.1 3.5 - 5.1 mmol/L   Chloride 103 98 - 111 mmol/L   CO2 24 22 - 32 mmol/L   Glucose, Bld 82 70 - 99 mg/dL    Comment: Glucose reference range applies only to samples taken after fasting for at least 8 hours.   BUN 11 8 - 23 mg/dL   Creatinine, Ser 7.61 0.61 - 1.24 mg/dL   Calcium 8.1 (L) 8.9 - 10.3 mg/dL   Phosphorus 2.9 2.5 - 4.6 mg/dL   Albumin 1.7 (L) 3.5 - 5.0 g/dL   GFR, Estimated >95 >09 mL/min    Comment: (NOTE) Calculated using the CKD-EPI Creatinine Equation (2021)    Anion gap 8 5 - 15    Comment: Performed at [facility], [address], [place], [place] [address]  Magnesium     Status: None   Collection Time: 06/20/22  5:05 AM  Result Value Ref Range   Magnesium 1.8 1.7 - 2.4 mg/dL    Comment: Performed at [facility], [address] [place], [place] [address]  CBC with Differential/Platelet     Status: Abnormal   Collection Time: 06/20/22  5:05 AM  Result Value Ref Range   WBC 12.5 (H) 4.0 - 10.5 K/uL   RBC 3.28 (L) 4.22 - 5.81 MIL/uL   Hemoglobin 9.4 (L) 13.0 - 17.0 g/dL   HCT 99.8 (L) 33.8 - 25.0 %   MCV 86.6 80.0 - 100.0 fL   MCH 28.7 26.0 - 34.0 pg   MCHC 33.1 30.0 - 36.0 g/dL   RDW 53.9 (H) 76.7 - 34.1 %   Platelets 652 (H) 150 - 400 K/uL   nRBC 0.0 0.0 - 0.2 %   Neutrophils Relative % 61 %   Neutro Abs 7.7 1.7 - 7.7 K/uL   Lymphocytes Relative 27 %   Lymphs Abs 3.3 0.7 - 4.0 K/uL   Monocytes Relative 9 %   Monocytes Absolute 1.2 (H) 0.1 - 1.0 K/uL   Eosinophils Relative 1 %   Eosinophils Absolute 0.2 0.0 - 0.5 K/uL   Basophils Relative 1 %   Basophils Absolute 0.1 0.0 - 0.1 K/uL   Immature Granulocytes 1 %   Abs Immature Granulocytes 0.09 (H) 0.00 - 0.07 K/uL    Comment: Performed at [facility], [address] [place],  [place] [address]    CT CHEST WO CONTRAST  Result Date: 06/20/2022 CLINICAL DATA:  Empyema EXAM: CT CHEST WITHOUT CONTRAST TECHNIQUE:  Multidetector CT imaging of the chest was performed following the standard protocol without IV contrast. RADIATION DOSE REDUCTION: This exam was performed according to the departmental dose-optimization program which includes automated exposure control, adjustment of the mA and/or kV according to patient size and/or use of iterative reconstruction technique. COMPARISON:  06/14/2022 FINDINGS: Cardiovascular: Coronary, aortic arch, and branch vessel atherosclerotic vascular disease. Mediastinum/Nodes: Calcified left infrahilar nodes compatible with old granulomatous disease. Lungs/Pleura: Subsegmental atelectasis posteriorly in the right lower lobe with increased plugging of right lower lobe airways as shown on image 99 series 6. There is some frothy fluid in the bronchus intermedius dependently. Pigtail left pleural drainage catheter in place posteriorly, within a left hydropneumothorax encompassing about 30% of left hemithoracic volume. There is substantial pleural thickening along the parietal and visceral pleural margins compatible with empyema, as well as peripheral subpleural atelectasis in the left lung. The pleural drainage tube does not appear kinked. The current hydropneumothorax is reduced in size compared to the previous large empyema shown on 06/14/2022. Upper Abdomen: Punctate calcifications in the spleen compatible with old granulomatous disease. Abdominal aortic atherosclerosis. Musculoskeletal: Severe right and moderate left degenerative glenohumeral arthropathy. IMPRESSION: 1. Left pleural drainage catheter in place with approximately 30% left hydropneumothorax, overall improved in volume compared to the empyema shown on 06/14/2022. There is pleural thickening compatible with inflammation/empyema, as well as peripheral subpleural atelectasis in the left lung. 2. Other imaging findings of potential clinical significance: Aortic Atherosclerosis (ICD10-I70.0). Coronary atherosclerosis. Old granulomatous  disease. Right greater than left degenerative glenohumeral arthropathy. Electronically Signed   By: Gaylyn Rong M.D.   On: 06/20/2022 09:35   DG CHEST PORT 1 VIEW  Result Date: 06/19/2022 CLINICAL DATA:  Left chest tube EXAM: PORTABLE CHEST 1 VIEW COMPARISON:  Chest x-ray dated June 18, 2022 FINDINGS: Cardiac and mediastinal contours are within normal limits. Small loculated left pleural effusion, increased in size when compared with prior exam. Left-sided chest tube in place. No evidence of pneumothorax. Left basilar lung opacities, likely due to atelectasis. IMPRESSION: Small loculated left pleural effusion, increased in size. Left-sided chest tube in place. Electronically Signed   By: Allegra Lai M.D.   On: 06/19/2022 08:26     I personally reviewed the CT images.  There is a space post drainage of pleural effusion c/w trapped lung.  Review of Systems  Constitutional:  Positive for activity change, fatigue and fever.  Respiratory:  Positive for cough and shortness of breath.   Cardiovascular:  Negative for chest pain and leg swelling.   Blood pressure 118/69, pulse 69, temperature 97.6 F (36.4 C), temperature source Oral, resp. rate 18, height 5\' 10"  (1.778 m), weight 87.5 kg, SpO2 96 %. Physical Exam Vitals reviewed.  Constitutional:      General: He is not in acute distress. Eyes:     General: No scleral icterus.    Extraocular Movements: Extraocular movements intact.  Cardiovascular:     Rate and Rhythm: Normal rate and regular rhythm.     Heart sounds: Murmur heard.     No friction rub. No gallop.  Pulmonary:     Effort: Pulmonary effort is normal. No respiratory distress.     Breath sounds: No wheezing or rales.     Comments: Diminished BS on left.  Tidal movement but no air leak with cough Abdominal:     General:  There is no distension.     Palpations: Abdomen is soft.  Skin:    General: Skin is warm and dry.     Comments: Venous stasis changes both LE   Neurological:     General: No focal deficit present.     Mental Status: He is alert and oriented to person, place, and time.     Assessment/Plan: David Callahan is a 66 yo man with a history of tobacco abuse, COPD, recurrent pneumonia, hypertension and hyperlipidemia.  He presented with SOB, dizziness and was found to be hypotensive.  CT showed a left pneumonia with a parapneumonic effusion.  A pigtail catheter was placed.  Fluid c/w exudate, cultures NG to date.  He was treated with thrombolytics with clearance of the effusion but his lung did not completely reexpand.  His space is relatively small, lung reexpanded about to the degree expected after a decortication, so I think it is unlikely that surgical decortication will make a significant difference is his pulmonary function.  He was noted to have an air leak earlier.  No definite leak at present although he has extreme tidal movement with coughing.  Keep Ct in place for now.  Monitor for resolution of air leak.   Would be inclined to see how he does with conservative management.  I fails to improve or develops a persistent air leak, decortication would be indicated.  Melrose Nakayama 06/20/2022, 3:59 PM

## 2022-06-20 NOTE — Plan of Care (Signed)

## 2022-06-20 NOTE — Progress Notes (Signed)
Progress Note  Patient: David Callahan:[id number] DOB: [birth year]  DOA: 06/14/2022  DOS: 06/20/2022    Brief hospital course: David Callahan is a 66 y.o. male PMH of COPD, HTN, tobacco use disorder, recurrent pleural effusion, recent hospitalization from 8/23-28 for CAP/exudative pleural effusion s/p thoracocentesis, and AKI returning to [facility] with increased shortness of breath, dizziness and low blood pressure, and admitted for recurrent exudative left pleural effusion.  CT chest showed moderate left loculated pleural effusion with gas.  He has WBC of 25 with left shift.  Creatinine 2.4 (1.3 on discharge recently).  Lactic acid 2.5.  Patient was started on vancomycin and cefepime.  Pulmonology consulted.  Patient was transferred to [facility] for further evaluation and management.   IR consulted and had CT-guided chest tube placed on 9/4 with removal of 700 cc exudative and cultures negative fluid right away.  Received fibrinolytics on 9/5, 9/6 and 9/7, repeat CT shows failure of lung to reexpand. Cardiothoracic surgery consulted, recommending continued conservative management for now in lieu of surgical decortication.   Assessment and Plan: Community-acquired pneumonia with parapneumonic effusion: POA.  Hospitalized 8/23-28 and had thoracocentesis with removal of exudative and cultures negative fluid.  He was discharged on Levaquin.  Returns with SOB, hypotension and near syncope.  Had leukocytosis to 25 with left shift.  Lactic acidosis of 2.5.  CT chest as above.  MRSA PCR screen positive.  RVP and COVID-19 PCR negative.  Blood and pleural fluid cultures negative.  - s/p CT-guided chest tube by IR on 9/4. Continues fair output, improved pleural fluid per CT. - s/p lytics 9/5, 9/6, 9/7. - Continue antibiotics for approximate 21 day course currently planned, taking po so will switch to augmentin. - Follow QuantiFERON gold - Pulmonary toilet - CT surgery consulted per pulmonary.    Chronic COPD/tobacco abuse- - Continue bronchodilators and nicotine patch - Encouraged smoking cessation.   Hypotension/history of hypertension: Hypotension resolved.   -Continue holding home benazepril in the setting of AKI and Cardizem in the setting of borderline heart rate -Started low-dose amlodipine. Will likely revert to home meds soon.   AKI: Likely prerenal in the setting of sepsis and hypotension. Resolved.   Anemia of chronic disease with iron deficiency: Drop in Hgb likely dilutional from IVF.  Iron saturation 7%. - Monitor H&H   Thrombocytosis: Likely reactive.    Lactic acidosis: Likely due to #1.  Resolved.   Restless leg syndrome: Continue requip   Sepsis ruled out.  Subjective: No new complaints. States he feels much better breathing than when he presented. No chest pain. Manageable left chest pain related to tube.   Objective: Vitals:   06/19/22 1400 06/19/22 2127 06/20/22 0556 06/20/22 0751  BP: 123/68 100/69 118/69   Pulse: 68 73 69   Resp: 20 16 18    Temp: (!) 97.4 F (36.3 C) 98 F (36.7 C) 97.6 F (36.4 C)   TempSrc: Oral Oral Oral   SpO2: 97% 97% 100% 96%  Weight:   87.5 kg   Height:       Gen: 66 y.o. male in no distress Pulm: Nonlabored breathing room air.  Crackles, coarse lung sounds bilaterally, diminished at L > R bases. No wheezes. No bubbles, but significant tidal movement with respirations.  CV: Regular rate and rhythm. No murmur, rub, or gallop. No JVD, no dependent edema. GI: Abdomen soft, non-tender, non-distended, with normoactive bowel sounds.  Ext: Warm, no deformities Skin: No rashes, lesions or ulcers on  visualized skin. Left lower lateral chest wall CT site is c/d/i Neuro: Alert and oriented. No focal neurological deficits. Psych: Judgement and insight appear fair. Mood euthymic & affect congruent. Behavior is appropriate.    Data Personally reviewed:  CBC: Recent Labs  Lab 06/14/22 0830 06/15/22 0621 06/15/22 0854  06/16/22 0153 06/17/22 0349 06/18/22 0631 06/19/22 0254 06/20/22 0505  WBC 24.8*   < > 16.8* 16.5* 14.0* 13.5* 16.0* 12.5*  NEUTROABS 20.1*  --  12.3* 11.9* 9.6*  --   --  7.7  HGB 9.5*   < > 9.1* 8.6* 8.7* 9.5* 9.1* 9.4*  HCT 29.6*   < > 27.2* 26.4* 26.6* 28.7* 27.5* 28.4*  MCV 89.4   < > 87.2 87.4 86.9 86.4 86.2 86.6  PLT 670*   < > 630* 624* 551* 590* 566* 652*   < > = values in this interval not displayed.   Basic Metabolic Panel: Recent Labs  Lab 06/16/22 0153 06/17/22 0349 06/18/22 0631 06/19/22 0254 06/20/22 0505  NA 137 137 134* 131* 135  K 4.5 4.2 3.7 4.1 4.1  CL 104 101 102 99 103  CO2 21* 25 23 24 24   GLUCOSE 80 89 85 104* 82  BUN 17 17 13 12 11   CREATININE 1.38*  1.40* 1.29* 1.03 1.02 0.99  CALCIUM 8.1* 8.2* 8.0* 8.1* 8.1*  MG 1.7 1.7 1.7 1.7 1.8  PHOS 3.6 2.9 2.9 3.0 2.9   GFR: Estimated Creatinine Clearance: 76.8 mL/min (by C-G formula based on SCr of 0.99 mg/dL). Liver Function Tests: Recent Labs  Lab 06/14/22 0830 06/15/22 0621 06/15/22 0854 06/16/22 0153 06/17/22 0349 06/18/22 0631 06/19/22 0254 06/20/22 0505  AST 25 28 35  --   --   --   --   --   ALT 28 28 24   --   --   --   --   --   ALKPHOS 85 70 79  --   --   --   --   --   BILITOT 1.0 0.6 0.9  --   --   --   --   --   PROT 6.5 5.5* 5.5*  --   --   --   --   --   ALBUMIN 2.2* 1.7* 1.8* 1.8* 1.7* 1.8* 1.7* 1.7*   Recent Labs  Lab 06/14/22 0830  LIPASE 22   No results for input(s): "AMMONIA" in the last 168 hours. Coagulation Profile: No results for input(s): "INR", "PROTIME" in the last 168 hours. Cardiac Enzymes: No results for input(s): "CKTOTAL", "CKMB", "CKMBINDEX", "TROPONINI" in the last 168 hours. BNP (last 3 results) No results for input(s): "PROBNP" in the last 8760 hours. HbA1C: No results for input(s): "HGBA1C" in the last 72 hours. CBG: Recent Labs  Lab 06/14/22 0817  GLUCAP 131*   Lipid Profile: No results for input(s): "CHOL", "HDL", "LDLCALC", "TRIG",  "CHOLHDL", "LDLDIRECT" in the last 72 hours. Thyroid Function Tests: No results for input(s): "TSH", "T4TOTAL", "FREET4", "T3FREE", "THYROIDAB" in the last 72 hours. Anemia Panel: No results for input(s): "VITAMINB12", "FOLATE", "FERRITIN", "TIBC", "IRON", "RETICCTPCT" in the last 72 hours. Urine analysis:    Component Value Date/Time   COLORURINE YELLOW 06/14/2022 1210   APPEARANCEUR CLEAR 06/14/2022 1210   LABSPEC 1.010 06/14/2022 1210   PHURINE 6.0 06/14/2022 1210   GLUCOSEU NEGATIVE 06/14/2022 1210   HGBUR SMALL (A) 06/14/2022 1210   BILIRUBINUR NEGATIVE 06/14/2022 1210   KETONESUR NEGATIVE 06/14/2022 1210   PROTEINUR NEGATIVE 06/14/2022 1210  NITRITE NEGATIVE 06/14/2022 1210   LEUKOCYTESUR NEGATIVE 06/14/2022 1210   Recent Results (from the past 240 hour(s))  Culture, blood (Routine X 2) w Reflex to ID Panel     Status: None   Collection Time: 06/14/22  8:50 AM   Specimen: Left Antecubital; Blood  Result Value Ref Range Status   Specimen Description   Final    LEFT ANTECUBITAL BOTTLES DRAWN AEROBIC AND ANAEROBIC   Special Requests Blood Culture adequate volume  Final   Culture   Final    NO GROWTH 5 DAYS Performed at [facility], [address] [place], [place] [address]    Report Status 06/19/2022 FINAL  Final  SARS Coronavirus 2 by RT PCR (hospital order, performed in [facility] hospital lab) *cepheid single result test* Anterior Nasal Swab     Status: None   Collection Time: 06/14/22  8:58 AM   Specimen: Anterior Nasal Swab  Result Value Ref Range Status   SARS Coronavirus 2 by RT PCR NEGATIVE NEGATIVE Final    Comment: (NOTE) SARS-CoV-2 target nucleic acids are NOT DETECTED.  The SARS-CoV-2 RNA is generally detectable in upper and lower respiratory specimens during the acute phase of infection. The lowest concentration of SARS-CoV-2 viral copies this assay can detect is 250 copies / mL. A negative result does not preclude SARS-CoV-2 infection and should not be  used as the sole basis for treatment or other patient management decisions.  A negative result may occur with improper specimen collection / handling, submission of specimen other than nasopharyngeal swab, presence of viral mutation(s) within the areas targeted by this assay, and inadequate number of viral copies (<250 copies / mL). A negative result must be combined with clinical observations, patient history, and epidemiological information.  Fact Sheet for Patients:   RoadLapTop.co.za  Fact Sheet for Healthcare Providers: http://kim-miller.com/  This test is not yet approved or  cleared by the [place] FDA and has been authorized for detection and/or diagnosis of SARS-CoV-2 by FDA under an Emergency Use Authorization (EUA).  This EUA will remain in effect (meaning this test can be used) for the duration of the COVID-19 declaration under Section 564(b)(1) of the Act, 21 U.S.C. section 360bbb-3(b)(1), unless the authorization is terminated or revoked sooner.  Performed at [facility], [address] [place], [place] [address]   Culture, blood (Routine X 2) w Reflex to ID Panel     Status: None   Collection Time: 06/14/22  9:09 AM   Specimen: BLOOD RIGHT HAND  Result Value Ref Range Status   Specimen Description   Final    BLOOD RIGHT HAND BOTTLES DRAWN AEROBIC AND ANAEROBIC   Special Requests   Final    Blood Culture results may not be optimal due to an excessive volume of blood received in culture bottles   Culture   Final    NO GROWTH 5 DAYS Performed at [facility], [address] [place], [place] [address]    Report Status 06/19/2022 FINAL  Final  MRSA Next Gen by PCR, Nasal     Status: Abnormal   Collection Time: 06/14/22  9:45 AM   Specimen: Nasal Mucosa; Nasal Swab  Result Value Ref Range Status   MRSA by PCR Next Gen DETECTED (A) NOT DETECTED Final    Comment: RESULT CALLED TO, READ BACK BY AND VERIFIED WITH: EANES  M @ 1138 ON [id number] BY HENDERSON L (NOTE) The GeneXpert MRSA Assay (FDA approved for NASAL specimens only), is one component of  a comprehensive MRSA colonization surveillance program. It is not intended to diagnose MRSA infection nor to guide or monitor treatment for MRSA infections. Test performance is not FDA approved in patients less than 72 years old. Performed at [facility], [address] [place], [place] [address]   Respiratory (~20 pathogens) panel by PCR     Status: None   Collection Time: 06/14/22  9:30 PM   Specimen: Nasopharyngeal Swab; Respiratory  Result Value Ref Range Status   Adenovirus NOT DETECTED NOT DETECTED Final   Coronavirus 229E NOT DETECTED NOT DETECTED Final    Comment: (NOTE) The Coronavirus on the Respiratory Panel, DOES NOT test for the novel  Coronavirus (2019 nCoV)    Coronavirus HKU1 NOT DETECTED NOT DETECTED Final   Coronavirus NL63 NOT DETECTED NOT DETECTED Final   Coronavirus OC43 NOT DETECTED NOT DETECTED Final   Metapneumovirus NOT DETECTED NOT DETECTED Final   Rhinovirus / Enterovirus NOT DETECTED NOT DETECTED Final   Influenza A NOT DETECTED NOT DETECTED Final   Influenza B NOT DETECTED NOT DETECTED Final   Parainfluenza Virus 1 NOT DETECTED NOT DETECTED Final   Parainfluenza Virus 2 NOT DETECTED NOT DETECTED Final   Parainfluenza Virus 3 NOT DETECTED NOT DETECTED Final   Parainfluenza Virus 4 NOT DETECTED NOT DETECTED Final   Respiratory Syncytial Virus NOT DETECTED NOT DETECTED Final   Bordetella pertussis NOT DETECTED NOT DETECTED Final   Bordetella Parapertussis NOT DETECTED NOT DETECTED Final   Chlamydophila pneumoniae NOT DETECTED NOT DETECTED Final   Mycoplasma pneumoniae NOT DETECTED NOT DETECTED Final    Comment: Performed at [facility], [place] [address] [place], [place] [address]  Aerobic/Anaerobic Culture w Gram Stain (surgical/deep wound)     Status: None (Preliminary result)   Collection Time: 06/16/22  1:14 PM    Specimen: Lung  Result Value Ref Range Status   Specimen Description LUNG  Final   Special Requests NONE  Final   Gram Stain   Final    RARE WBC PRESENT,BOTH PMN AND MONONUCLEAR NO ORGANISMS SEEN    Culture   Final    NO GROWTH 4 DAYS NO ANAEROBES ISOLATED; CULTURE IN PROGRESS FOR 5 DAYS Performed at [facility], [place] [address] [place], [place] [address]    Report Status PENDING  Incomplete  Body fluid culture w Gram Stain     Status: None   Collection Time: 06/16/22  4:55 PM   Specimen: Pleural Fluid  Result Value Ref Range Status   Specimen Description PLEURAL  Final   Special Requests NONE  Final   Gram Stain NO WBC SEEN NO ORGANISMS SEEN   Final   Culture   Final    NO GROWTH 3 DAYS Performed at [facility], [place] [address] [place],  [address]    Report Status 06/20/2022 FINAL  Final     CT CHEST WO CONTRAST  Result Date: 06/20/2022 CLINICAL DATA:  Empyema EXAM: CT CHEST WITHOUT CONTRAST TECHNIQUE: Multidetector CT imaging of the chest was performed following the standard protocol without IV contrast. RADIATION DOSE REDUCTION: This exam was performed according to the departmental dose-optimization program which includes automated exposure control, adjustment of the mA and/or kV according to patient size and/or use of iterative reconstruction technique. COMPARISON:  06/14/2022 FINDINGS: Cardiovascular: Coronary, aortic arch, and branch vessel atherosclerotic vascular disease. Mediastinum/Nodes: Calcified left infrahilar nodes compatible with old granulomatous disease. Lungs/Pleura: Subsegmental atelectasis posteriorly in the right lower lobe with increased plugging of  right lower lobe airways as shown on image 99 series 6. There is some frothy fluid in the bronchus intermedius dependently. Pigtail left pleural drainage catheter in place posteriorly, within a left hydropneumothorax encompassing about 30% of left hemithoracic volume. There is substantial pleural  thickening along the parietal and visceral pleural margins compatible with empyema, as well as peripheral subpleural atelectasis in the left lung. The pleural drainage tube does not appear kinked. The current hydropneumothorax is reduced in size compared to the previous large empyema shown on 06/14/2022. Upper Abdomen: Punctate calcifications in the spleen compatible with old granulomatous disease. Abdominal aortic atherosclerosis. Musculoskeletal: Severe right and moderate left degenerative glenohumeral arthropathy. IMPRESSION: 1. Left pleural drainage catheter in place with approximately 30% left hydropneumothorax, overall improved in volume compared to the empyema shown on 06/14/2022. There is pleural thickening compatible with inflammation/empyema, as well as peripheral subpleural atelectasis in the left lung. 2. Other imaging findings of potential clinical significance: Aortic Atherosclerosis (ICD10-I70.0). Coronary atherosclerosis. Old granulomatous disease. Right greater than left degenerative glenohumeral arthropathy. Electronically Signed   By: Van Clines M.D.   On: 06/20/2022 09:35   DG CHEST PORT 1 VIEW  Result Date: 06/19/2022 CLINICAL DATA:  Left chest tube EXAM: PORTABLE CHEST 1 VIEW COMPARISON:  Chest x-ray dated June 18, 2022 FINDINGS: Cardiac and mediastinal contours are within normal limits. Small loculated left pleural effusion, increased in size when compared with prior exam. Left-sided chest tube in place. No evidence of pneumothorax. Left basilar lung opacities, likely due to atelectasis. IMPRESSION: Small loculated left pleural effusion, increased in size. Left-sided chest tube in place. Electronically Signed   By: Yetta Glassman M.D.   On: 06/19/2022 08:26    Family Communication: None at bedside  Disposition: Status is: Inpatient Remains inpatient appropriate because: Continued chest tube management Planned Discharge Destination: Home with [place], MD 06/20/2022 6:59 PM Page by [email or web address]

## 2022-06-20 NOTE — Progress Notes (Signed)
NAME:  David Callahan, MRN:  [id number], DOB:  1956-01-31, LOS: 6 ADMISSION DATE:  06/14/2022, CONSULTATION DATE:  06/14/2022 REFERRING MD:  Tyrone Nine, MD, CHIEF COMPLAINT:  pleural effusion  History of Present Illness:  50 y9o man with history of COPD and ongoing tobacco use disorder. Recent hospitalization for COPD exacerbation and left pleural effusion in August 2023. This was drained by IR, but not fully drained - was exudative. He was discharged home on abx 8/26 and represented to the ED at [facility] 9/2 for worsening shortness of breath. Found to have persistent let pleural effusion,   Pertinent  Medical History  Copd Tobacco use disorder  Significant Hospital Events: Including procedures, antibiotic start and stop dates in addition to other pertinent events   9/4 CT guided pigtail by IR Plan for CT without contrast CT of the chest 06/20/2022  Interim History / Subjective:  No acute distress await CT scan of the chest  Objective   Blood pressure 118/69, pulse 69, temperature 97.6 F (36.4 C), temperature source Oral, resp. rate 18, height 5\' 10"  (1.778 m), weight 87.5 kg, SpO2 96 %.        Intake/Output Summary (Last 24 hours) at 06/20/2022 0830 Last data filed at 06/20/2022 0645 Gross per 24 hour  Intake 30 ml  Output 2440 ml  Net -2410 ml   Filed Weights   06/18/22 0500 06/19/22 0559 06/20/22 0556  Weight: 88 kg 87.9 kg 87.5 kg    Examination: General: Well-nourished well-developed male no acute distress HEENT: MM pink/moist no JVD is appreciated Neuro: Grossly intact without focal defect CV: Heart sounds are regular PULM: Decreased breath sounds right base right chest tube without air leak Heart oxygen  GI: soft, bsx4 active  GU: Voids Extremities: warm/dry, negative edema  Skin: no rashes or lesions   200 cc drainage from right chest and CT scan of the chest is pending  Resolved Hospital Problem list     Assessment & Plan:    Persistent left pleural  effusion, exudative, suspected parapneumonic vs empyema COPD with ongoing tobacco use disorder  Plan: 3 dose of lytics given on 03/19/2022 at 0 900 drainage has decreased from 980/82 203 over the last 3 days.   We will change chest tube to waterseal by removing suction CT scan is pending Continue antimicrobial therapy for approximately 21 days Remove chest tube when drainage is less than 100 cc day        Labs   CBC: Recent Labs  Lab 06/14/22 0830 06/15/22 0621 06/15/22 0854 06/16/22 0153 06/17/22 0349 06/18/22 0631 06/19/22 0254 06/20/22 0505  WBC 24.8*   < > 16.8* 16.5* 14.0* 13.5* 16.0* 12.5*  NEUTROABS 20.1*  --  12.3* 11.9* 9.6*  --   --  7.7  HGB 9.5*   < > 9.1* 8.6* 8.7* 9.5* 9.1* 9.4*  HCT 29.6*   < > 27.2* 26.4* 26.6* 28.7* 27.5* 28.4*  MCV 89.4   < > 87.2 87.4 86.9 86.4 86.2 86.6  PLT 670*   < > 630* 624* 551* 590* 566* 652*   < > = values in this interval not displayed.    Basic Metabolic Panel: Recent Labs  Lab 06/16/22 0153 06/17/22 0349 06/18/22 0631 06/19/22 0254 06/20/22 0505  NA 137 137 134* 131* 135  K 4.5 4.2 3.7 4.1 4.1  CL 104 101 102 99 103  CO2 21* 25 23 24 24   GLUCOSE 80 89 85 104* 82  BUN 17 17 13  12 11  CREATININE 1.38*  1.40* 1.29* 1.03 1.02 0.99  CALCIUM 8.1* 8.2* 8.0* 8.1* 8.1*  MG 1.7 1.7 1.7 1.7 1.8  PHOS 3.6 2.9 2.9 3.0 2.9   GFR: Estimated Creatinine Clearance: 76.8 mL/min (by C-G formula based on SCr of 0.99 mg/dL). Recent Labs  Lab 06/14/22 0850 06/14/22 1059 06/14/22 2052 06/15/22 0621 06/17/22 0349 06/18/22 0631 06/19/22 0254 06/20/22 0505  PROCALCITON  --   --  0.48  --   --   --   --   --   WBC  --   --   --    < > 14.0* 13.5* 16.0* 12.5*  LATICACIDVEN 2.5* 2.0* 1.4  --   --   --   --   --    < > = values in this interval not displayed.    Liver Function Tests: Recent Labs  Lab 06/14/22 0830 06/15/22 0621 06/15/22 0854 06/16/22 0153 06/17/22 0349 06/18/22 0631 06/19/22 0254 06/20/22 0505  AST 25  28 35  --   --   --   --   --   ALT 28 28 24   --   --   --   --   --   ALKPHOS 85 70 79  --   --   --   --   --   BILITOT 1.0 0.6 0.9  --   --   --   --   --   PROT 6.5 5.5* 5.5*  --   --   --   --   --   ALBUMIN 2.2* 1.7* 1.8* 1.8* 1.7* 1.8* 1.7* 1.7*   Recent Labs  Lab 06/14/22 0830  LIPASE 22   No results for input(s): "AMMONIA" in the last 168 hours.  ABG    Component Value Date/Time   HCO3 31.6 (H) 06/04/2022 2032   O2SAT 30.9 06/04/2022 2032     Coagulation Profile: No results for input(s): "INR", "PROTIME" in the last 168 hours.  Cardiac Enzymes: No results for input(s): "CKTOTAL", "CKMB", "CKMBINDEX", "TROPONINI" in the last 168 hours.  HbA1C: Hgb A1c MFr Bld  Date/Time Value Ref Range Status  06/05/2022 05:03 AM 5.4 4.8 - 5.6 % Final    Comment:    (NOTE) Pre diabetes:          5.7%-6.4%  Diabetes:              >6.4%  Glycemic control for   <7.0% adults with diabetes     CBG: Recent Labs  Lab 06/14/22 0817  GLUCAP 131*     Steve Khalid Lacko ACNP Acute Care Nurse Practitioner 08/14/22 Pulmonary/Critical Care Please consult Amion 06/20/2022, 8:30 AM

## 2022-06-21 ENCOUNTER — Inpatient Hospital Stay (HOSPITAL_COMMUNITY): Payer: Medicare PPO

## 2022-06-21 DIAGNOSIS — J449 Chronic obstructive pulmonary disease, unspecified: Secondary | ICD-10-CM | POA: Diagnosis not present

## 2022-06-21 DIAGNOSIS — J90 Pleural effusion, not elsewhere classified: Secondary | ICD-10-CM | POA: Diagnosis not present

## 2022-06-21 DIAGNOSIS — J189 Pneumonia, unspecified organism: Secondary | ICD-10-CM | POA: Diagnosis not present

## 2022-06-21 DIAGNOSIS — N179 Acute kidney failure, unspecified: Secondary | ICD-10-CM | POA: Diagnosis not present

## 2022-06-21 LAB — AEROBIC/ANAEROBIC CULTURE W GRAM STAIN (SURGICAL/DEEP WOUND): Culture: NO GROWTH

## 2022-06-21 NOTE — Plan of Care (Signed)
°  Problem: Clinical Measurements: °Goal: Respiratory complications will improve °Outcome: Progressing °Goal: Cardiovascular complication will be avoided °Outcome: Progressing °  °Problem: Activity: °Goal: Risk for activity intolerance will decrease °Outcome: Progressing °  °

## 2022-06-21 NOTE — Progress Notes (Addendum)
Progress Note  Patient: David Callahan MLY:[id number] DOB: 17-Mar-1956  DOA: 06/14/2022  DOS: 06/21/2022    Brief hospital course: David Callahan is a 66 y.o. male PMH of COPD, HTN, tobacco use disorder, recurrent pleural effusion, recent hospitalization from 8/23-28 for CAP/exudative pleural effusion s/p thoracocentesis, and AKI returning to [facility] with increased shortness of breath, dizziness and low blood pressure, and admitted for recurrent exudative left pleural effusion.  CT chest showed moderate left loculated pleural effusion with gas.  He has WBC of 25 with left shift.  Creatinine 2.4 (1.3 on discharge recently).  Lactic acid 2.5.  Patient was started on vancomycin and cefepime.  Pulmonology consulted.  Patient was transferred to [facility] for further evaluation and management.   IR consulted and had CT-guided chest tube placed on 9/4 with removal of 700 cc exudative and cultures negative fluid right away.  Received fibrinolytics on 9/5, 9/6 and 9/7, repeat CT shows failure of lung to reexpand. Cardiothoracic surgery consulted, recommending continued conservative management for now in lieu of surgical decortication.   Assessment and Plan: Community-acquired pneumonia with parapneumonic effusion: POA.  Hospitalized 8/23-28 and had thoracocentesis with removal of exudative and cultures negative fluid.  He was discharged on Levaquin.  Returns with SOB, hypotension and near syncope.  Had leukocytosis to 25 with left shift.  Lactic acidosis of 2.5.  CT chest as above.  MRSA PCR screen positive.  RVP and COVID-19 PCR negative.  Blood and pleural fluid cultures negative.  - s/p CT-guided chest tube by IR on 9/4. To water seal, management per PCCM. - s/p lytics 9/5, 9/6, 9/7. - Continue antibiotics for approximate 21 day course currently planned, now augmentin. - Follow QuantiFERON gold - Pulmonary toilet - CT surgery consulted per pulmonary.   Chronic COPD/tobacco abuse- - Continue  bronchodilators and nicotine patch - Encouraged smoking cessation.   Hypotension/history of hypertension: Hypotension resolved.   -Continue holding home benazepril in the setting of AKI and Cardizem in the setting of borderline heart rate - Started low-dose amlodipine. Will likely revert to home meds at discharge   AKI: Likely prerenal in the setting of sepsis and hypotension. Resolved.   Anemia of chronic disease with iron deficiency: Drop in Hgb likely dilutional from IVF.  Iron saturation 7%. - Monitor H&H   Thrombocytosis: Likely reactive.    Lactic acidosis: Likely due to #1.  Resolved.   Restless leg syndrome: Continue requip   Sepsis ruled out.  Subjective: Pt resting well, dyspnea much improved. No new complaints.   Objective: Vitals:   06/20/22 2004 06/21/22 0507 06/21/22 0816 06/21/22 1010  BP:  136/72  108/78  Pulse: 86 75 88 70  Resp:  18 17 18   Temp:  97.9 F (36.6 C)  (!) 97.3 F (36.3 C)  TempSrc:  Oral  Oral  SpO2: 97% 100% 97%   Weight:  83.7 kg    Height:       Gen: 66 y.o. male in no distress Pulm: Nonlabored breathing room air. Cleared crackles, still diminished at left > right base. CV: Regular rate and rhythm. No murmur, rub, or gallop. No JVD, no significant dependent edema. GI: Abdomen soft, non-tender, non-distended, with normoactive bowel sounds.  Ext: Warm, no deformities Skin: No new rashes, lesions or ulcers on visualized skin. CT site c/d/i Neuro: Alert and oriented. No focal neurological deficits. Psych: Judgement and insight appear fair. Mood euthymic & affect congruent. Behavior is appropriate.    Data Personally reviewed: CXR  this AM shows improved/decreased effusion.  CBC: Recent Labs  Lab 06/15/22 0854 06/16/22 0153 06/17/22 0349 06/18/22 0631 06/19/22 0254 06/20/22 0505  WBC 16.8* 16.5* 14.0* 13.5* 16.0* 12.5*  NEUTROABS 12.3* 11.9* 9.6*  --   --  7.7  HGB 9.1* 8.6* 8.7* 9.5* 9.1* 9.4*  HCT 27.2* 26.4* 26.6* 28.7* 27.5*  28.4*  MCV 87.2 87.4 86.9 86.4 86.2 86.6  PLT 630* 624* 551* 590* 566* 652*   Basic Metabolic Panel: Recent Labs  Lab 06/16/22 0153 06/17/22 0349 06/18/22 0631 06/19/22 0254 06/20/22 0505  NA 137 137 134* 131* 135  K 4.5 4.2 3.7 4.1 4.1  CL 104 101 102 99 103  CO2 21* 25 23 24 24   GLUCOSE 80 89 85 104* 82  BUN 17 17 13 12 11   CREATININE 1.38*  1.40* 1.29* 1.03 1.02 0.99  CALCIUM 8.1* 8.2* 8.0* 8.1* 8.1*  MG 1.7 1.7 1.7 1.7 1.8  PHOS 3.6 2.9 2.9 3.0 2.9   GFR: Estimated Creatinine Clearance: 76.8 mL/min (by C-G formula based on SCr of 0.99 mg/dL). Liver Function Tests: Recent Labs  Lab 06/15/22 0621 06/15/22 0854 06/16/22 0153 06/17/22 0349 06/18/22 0631 06/19/22 0254 06/20/22 0505  AST 28 35  --   --   --   --   --   ALT 28 24  --   --   --   --   --   ALKPHOS 70 79  --   --   --   --   --   BILITOT 0.6 0.9  --   --   --   --   --   PROT 5.5* 5.5*  --   --   --   --   --   ALBUMIN 1.7* 1.8* 1.8* 1.7* 1.8* 1.7* 1.7*   No results for input(s): "LIPASE", "AMYLASE" in the last 168 hours.  No results for input(s): "AMMONIA" in the last 168 hours. Coagulation Profile: No results for input(s): "INR", "PROTIME" in the last 168 hours. Cardiac Enzymes: No results for input(s): "CKTOTAL", "CKMB", "CKMBINDEX", "TROPONINI" in the last 168 hours. BNP (last 3 results) No results for input(s): "PROBNP" in the last 8760 hours. HbA1C: No results for input(s): "HGBA1C" in the last 72 hours. CBG: No results for input(s): "GLUCAP" in the last 168 hours.  Lipid Profile: No results for input(s): "CHOL", "HDL", "LDLCALC", "TRIG", "CHOLHDL", "LDLDIRECT" in the last 72 hours. Thyroid Function Tests: No results for input(s): "TSH", "T4TOTAL", "FREET4", "T3FREE", "THYROIDAB" in the last 72 hours. Anemia Panel: No results for input(s): "VITAMINB12", "FOLATE", "FERRITIN", "TIBC", "IRON", "RETICCTPCT" in the last 72 hours. Urine analysis:    Component Value Date/Time   COLORURINE  YELLOW 06/14/2022 1210   APPEARANCEUR CLEAR 06/14/2022 1210   LABSPEC 1.010 06/14/2022 1210   PHURINE 6.0 06/14/2022 1210   GLUCOSEU NEGATIVE 06/14/2022 1210   HGBUR SMALL (A) 06/14/2022 1210   BILIRUBINUR NEGATIVE 06/14/2022 1210   KETONESUR NEGATIVE 06/14/2022 1210   PROTEINUR NEGATIVE 06/14/2022 1210   NITRITE NEGATIVE 06/14/2022 1210   LEUKOCYTESUR NEGATIVE 06/14/2022 1210   Recent Results (from the past 240 hour(s))  Culture, blood (Routine X 2) w Reflex to ID Panel     Status: None   Collection Time: 06/14/22  8:50 AM   Specimen: Left Antecubital; Blood  Result Value Ref Range Status   Specimen Description   Final    LEFT ANTECUBITAL BOTTLES DRAWN AEROBIC AND ANAEROBIC   Special Requests Blood Culture adequate volume  Final  Culture   Final    NO GROWTH 5 DAYS Performed at [facility], [address] [place], [place] [address]    Report Status 06/19/2022 FINAL  Final  SARS Coronavirus 2 by RT PCR (hospital order, performed in [facility] hospital lab) *cepheid single result test* Anterior Nasal Swab     Status: None   Collection Time: 06/14/22  8:58 AM   Specimen: Anterior Nasal Swab  Result Value Ref Range Status   SARS Coronavirus 2 by RT PCR NEGATIVE NEGATIVE Final    Comment: (NOTE) SARS-CoV-2 target nucleic acids are NOT DETECTED.  The SARS-CoV-2 RNA is generally detectable in upper and lower respiratory specimens during the acute phase of infection. The lowest concentration of SARS-CoV-2 viral copies this assay can detect is 250 copies / mL. A negative result does not preclude SARS-CoV-2 infection and should not be used as the sole basis for treatment or other patient management decisions.  A negative result may occur with improper specimen collection / handling, submission of specimen other than nasopharyngeal swab, presence of viral mutation(s) within the areas targeted by this assay, and inadequate number of viral copies (<250 copies / mL). A negative result  must be combined with clinical observations, patient history, and epidemiological information.  Fact Sheet for Patients:   RoadLapTop.co.za  Fact Sheet for Healthcare Providers: http://kim-miller.com/  This test is not yet approved or  cleared by the [place] FDA and has been authorized for detection and/or diagnosis of SARS-CoV-2 by FDA under an Emergency Use Authorization (EUA).  This EUA will remain in effect (meaning this test can be used) for the duration of the COVID-19 declaration under Section 564(b)(1) of the Act, 21 U.S.C. section 360bbb-3(b)(1), unless the authorization is terminated or revoked sooner.  Performed at [facility], [address], [place], [place] [address]   Culture, blood (Routine X 2) w Reflex to ID Panel     Status: None   Collection Time: 06/14/22  9:09 AM   Specimen: BLOOD RIGHT HAND  Result Value Ref Range Status   Specimen Description   Final    BLOOD RIGHT HAND BOTTLES DRAWN AEROBIC AND ANAEROBIC   Special Requests   Final    Blood Culture results may not be optimal due to an excessive volume of blood received in culture bottles   Culture   Final    NO GROWTH 5 DAYS Performed at [facility], [address], [place], [place] [address]    Report Status 06/19/2022 FINAL  Final  MRSA Next Gen by PCR, Nasal     Status: Abnormal   Collection Time: 06/14/22  9:45 AM   Specimen: Nasal Mucosa; Nasal Swab  Result Value Ref Range Status   MRSA by PCR Next Gen DETECTED (A) NOT DETECTED Final    Comment: RESULT CALLED TO, READ BACK BY AND VERIFIED WITH: EANES M @ 1138 ON [id number] BY HENDERSON L (NOTE) The GeneXpert MRSA Assay (FDA approved for NASAL specimens only), is one component of a comprehensive MRSA colonization surveillance program. It is not intended to diagnose MRSA infection nor to guide or monitor treatment for MRSA infections. Test performance is not FDA approved in patients less than 7  years old. Performed at [facility], [address] [place], [place] [address]   Respiratory (~20 pathogens) panel by PCR     Status: None   Collection Time: 06/14/22  9:30 PM   Specimen: Nasopharyngeal Swab; Respiratory  Result Value Ref Range Status   Adenovirus NOT  DETECTED NOT DETECTED Final   Coronavirus 229E NOT DETECTED NOT DETECTED Final    Comment: (NOTE) The Coronavirus on the Respiratory Panel, DOES NOT test for the novel  Coronavirus (2019 nCoV)    Coronavirus HKU1 NOT DETECTED NOT DETECTED Final   Coronavirus NL63 NOT DETECTED NOT DETECTED Final   Coronavirus OC43 NOT DETECTED NOT DETECTED Final   Metapneumovirus NOT DETECTED NOT DETECTED Final   Rhinovirus / Enterovirus NOT DETECTED NOT DETECTED Final   Influenza A NOT DETECTED NOT DETECTED Final   Influenza B NOT DETECTED NOT DETECTED Final   Parainfluenza Virus 1 NOT DETECTED NOT DETECTED Final   Parainfluenza Virus 2 NOT DETECTED NOT DETECTED Final   Parainfluenza Virus 3 NOT DETECTED NOT DETECTED Final   Parainfluenza Virus 4 NOT DETECTED NOT DETECTED Final   Respiratory Syncytial Virus NOT DETECTED NOT DETECTED Final   Bordetella pertussis NOT DETECTED NOT DETECTED Final   Bordetella Parapertussis NOT DETECTED NOT DETECTED Final   Chlamydophila pneumoniae NOT DETECTED NOT DETECTED Final   Mycoplasma pneumoniae NOT DETECTED NOT DETECTED Final    Comment: Performed at [facility], [address] [place], [place] [address]  Aerobic/Anaerobic Culture w Gram Stain (surgical/deep wound)     Status: None   Collection Time: 06/16/22  1:14 PM   Specimen: Lung  Result Value Ref Range Status   Specimen Description LUNG  Final   Special Requests NONE  Final   Gram Stain   Final    RARE WBC PRESENT,BOTH PMN AND MONONUCLEAR NO ORGANISMS SEEN    Culture   Final    No growth aerobically or anaerobically. Performed at [facility], [address], [place], [place] [address]    Report Status 06/21/2022  FINAL  Final  Body fluid culture w Gram Stain     Status: None   Collection Time: 06/16/22  4:55 PM   Specimen: Pleural Fluid  Result Value Ref Range Status   Specimen Description PLEURAL  Final   Special Requests NONE  Final   Gram Stain NO WBC SEEN NO ORGANISMS SEEN   Final   Culture   Final    NO GROWTH 3 DAYS Performed at [facility], [address] [place], [place] [address]    Report Status 06/20/2022 FINAL  Final     DG CHEST PORT 1 VIEW  Result Date: 06/21/2022 CLINICAL DATA:  Pleural effusion EXAM: PORTABLE CHEST 1 VIEW COMPARISON:  Previous studies including the examination of 06/19/2022 FINDINGS: There is interval decrease in left pleural effusion. Left chest tube is noted with its tip in the medial left lower lung field. There is no pneumothorax. Linear densities are seen in left lower lung field suggesting subsegmental atelectasis. Rest of the lung Baze are clear. There is no pneumothorax. Degenerative changes are noted in right shoulder. IMPRESSION: There is interval decrease in left pleural effusion. There is no pneumothorax. Electronically Signed   By: Ernie Avena M.D.   On: 06/21/2022 10:30   CT CHEST WO CONTRAST  Result Date: 06/20/2022 CLINICAL DATA:  Empyema EXAM: CT CHEST WITHOUT CONTRAST TECHNIQUE: Multidetector CT imaging of the chest was performed following the standard protocol without IV contrast. RADIATION DOSE REDUCTION: This exam was performed according to the departmental dose-optimization program which includes automated exposure control, adjustment of the mA and/or kV according to patient size and/or use of iterative reconstruction technique. COMPARISON:  06/14/2022 FINDINGS: Cardiovascular: Coronary, aortic arch, and branch vessel atherosclerotic vascular disease. Mediastinum/Nodes: Calcified  left infrahilar nodes compatible with old granulomatous disease. Lungs/Pleura: Subsegmental atelectasis posteriorly in the right lower lobe with increased  plugging of right lower lobe airways as shown on image 99 series 6. There is some frothy fluid in the bronchus intermedius dependently. Pigtail left pleural drainage catheter in place posteriorly, within a left hydropneumothorax encompassing about 30% of left hemithoracic volume. There is substantial pleural thickening along the parietal and visceral pleural margins compatible with empyema, as well as peripheral subpleural atelectasis in the left lung. The pleural drainage tube does not appear kinked. The current hydropneumothorax is reduced in size compared to the previous large empyema shown on 06/14/2022. Upper Abdomen: Punctate calcifications in the spleen compatible with old granulomatous disease. Abdominal aortic atherosclerosis. Musculoskeletal: Severe right and moderate left degenerative glenohumeral arthropathy. IMPRESSION: 1. Left pleural drainage catheter in place with approximately 30% left hydropneumothorax, overall improved in volume compared to the empyema shown on 06/14/2022. There is pleural thickening compatible with inflammation/empyema, as well as peripheral subpleural atelectasis in the left lung. 2. Other imaging findings of potential clinical significance: Aortic Atherosclerosis (ICD10-I70.0). Coronary atherosclerosis. Old granulomatous disease. Right greater than left degenerative glenohumeral arthropathy. Electronically Signed   By: Gaylyn RongWalter  Liebkemann M.D.   On: 06/20/2022 09:35    Family Communication: None at bedside  Disposition: Status is: Inpatient Remains inpatient appropriate because: Continued chest tube management, discharge timing per pulmonology  Planned Discharge Destination: Home with Home Health  Tyrone Nineyan B Arzella Rehmann, MD 06/21/2022 4:27 PM Page by [email or web address]

## 2022-06-21 NOTE — Progress Notes (Signed)
Mobility Specialist - Progress Note   06/21/22 1105  Mobility  Activity Ambulated with assistance in hallway  Level of Assistance Standby assist, set-up cues, supervision of patient - no hands on  Assistive Device None  Distance Ambulated (ft) 280 ft  Activity Response Tolerated well  $Mobility charge 1 Mobility    During mobility: 116 HR Post-mobility:106 HR  Received pt in chair having no complaints and agreeable to mobility. Pt was asymptomatic throughout ambulation. Left in chair w/ call bell in reach and all needs met.   Larey Seat

## 2022-06-22 ENCOUNTER — Inpatient Hospital Stay (HOSPITAL_COMMUNITY): Payer: Medicare PPO

## 2022-06-22 DIAGNOSIS — N179 Acute kidney failure, unspecified: Secondary | ICD-10-CM | POA: Diagnosis not present

## 2022-06-22 DIAGNOSIS — J189 Pneumonia, unspecified organism: Secondary | ICD-10-CM | POA: Diagnosis not present

## 2022-06-22 DIAGNOSIS — J90 Pleural effusion, not elsewhere classified: Secondary | ICD-10-CM | POA: Diagnosis not present

## 2022-06-22 DIAGNOSIS — J449 Chronic obstructive pulmonary disease, unspecified: Secondary | ICD-10-CM | POA: Diagnosis not present

## 2022-06-22 DIAGNOSIS — J869 Pyothorax without fistula: Secondary | ICD-10-CM | POA: Diagnosis not present

## 2022-06-22 LAB — CBC WITH DIFFERENTIAL/PLATELET
Abs Immature Granulocytes: 0.08 10*3/uL — ABNORMAL HIGH (ref 0.00–0.07)
Basophils Absolute: 0.1 10*3/uL (ref 0.0–0.1)
Basophils Relative: 1 %
Eosinophils Absolute: 0.1 10*3/uL (ref 0.0–0.5)
Eosinophils Relative: 2 %
HCT: 26.1 % — ABNORMAL LOW (ref 39.0–52.0)
Hemoglobin: 8.5 g/dL — ABNORMAL LOW (ref 13.0–17.0)
Immature Granulocytes: 1 %
Lymphocytes Relative: 38 %
Lymphs Abs: 3.3 10*3/uL (ref 0.7–4.0)
MCH: 28.3 pg (ref 26.0–34.0)
MCHC: 32.6 g/dL (ref 30.0–36.0)
MCV: 87 fL (ref 80.0–100.0)
Monocytes Absolute: 0.7 10*3/uL (ref 0.1–1.0)
Monocytes Relative: 8 %
Neutro Abs: 4.5 10*3/uL (ref 1.7–7.7)
Neutrophils Relative %: 50 %
Platelets: 639 10*3/uL — ABNORMAL HIGH (ref 150–400)
RBC: 3 MIL/uL — ABNORMAL LOW (ref 4.22–5.81)
RDW: 17.4 % — ABNORMAL HIGH (ref 11.5–15.5)
WBC: 8.8 10*3/uL (ref 4.0–10.5)
nRBC: 0 % (ref 0.0–0.2)

## 2022-06-22 NOTE — Plan of Care (Signed)
  Problem: Clinical Measurements: Goal: Diagnostic test results will improve Outcome: Progressing Goal: Respiratory complications will improve Outcome: Progressing Goal: Cardiovascular complication will be avoided Outcome: Progressing   Problem: Activity: Goal: Risk for activity intolerance will decrease Outcome: Progressing   Problem: Coping: Goal: Level of anxiety will decrease Outcome: Progressing   Problem: Elimination: Goal: Will not experience complications related to urinary retention Outcome: Progressing   Problem: Safety: Goal: Ability to remain free from injury will improve Outcome: Progressing   Problem: Respiratory: Goal: Ability to maintain adequate ventilation will improve Outcome: Progressing Goal: Ability to maintain a clear airway will improve Outcome: Progressing

## 2022-06-22 NOTE — Progress Notes (Signed)
Progress Note  Patient: David Callahan [id number] DOB: [birth year]  DOA: 06/14/2022  DOS: 06/22/2022    Brief hospital course: David Callahan is a 66 y.o. male PMH of COPD, HTN, tobacco use disorder, recurrent pleural effusion, recent hospitalization from 8/23-28 for CAP/exudative pleural effusion s/p thoracocentesis, and AKI returning to [facility] with increased shortness of breath, dizziness and low blood pressure, and admitted for recurrent exudative left pleural effusion.  CT chest showed moderate left loculated pleural effusion with gas.  He has WBC of 25 with left shift.  Creatinine 2.4 (1.3 on discharge recently).  Lactic acid 2.5.  Patient was started on vancomycin and cefepime.  Pulmonology consulted.  Patient was transferred to [facility] for further evaluation and management.   IR consulted and had CT-guided chest tube placed on 9/4 with removal of 700 cc exudative and cultures negative fluid right away.  Received fibrinolytics on 9/5, 9/6 and 9/7, repeat CT shows failure of lung to reexpand. Cardiothoracic surgery consulted, recommending continued conservative management for now in lieu of surgical decortication.   Assessment and Plan: Community-acquired pneumonia with parapneumonic effusion: POA.  Hospitalized 8/23-28 and had thoracocentesis with removal of exudative and cultures negative fluid, cytology negative.  He was discharged on Levaquin.  Returns with SOB, hypotension and near syncope.  Had leukocytosis to 25 with left shift.  Lactic acidosis of 2.5.  CT chest as above.  MRSA PCR screen positive.  RVP and COVID-19 PCR negative.  Blood and pleural fluid cultures negative.  - s/p CT-guided chest tube by IR on 9/4. To water seal, management per PCCM. Recheck CXR today - s/p lytics 9/5, 9/6, 9/7. - Continue antibiotics for approximate 21 day course currently planned, now augmentin. - Follow QuantiFERON gold - Pulmonary toilet - CT surgery consulted per pulmonary.    Chronic COPD/tobacco abuse- - Continue bronchodilators and nicotine patch - Encouraged smoking cessation.   Hypotension/history of hypertension: Hypotension resolved.   - Continue holding home benazepril in the setting of AKI and Cardizem in the setting of borderline heart rate - Started low-dose amlodipine. Will likely revert to home meds at discharge   AKI: Likely prerenal in the setting of sepsis and hypotension. Resolved.   Anemia of chronic disease with iron deficiency: Drop in Hgb likely dilutional from IVF.  Iron saturation 7%. Overall stable in 8's.  - Monitor at follow up.   Thrombocytosis: Likely reactive. - Monitor at follow up.  Lactic acidosis: Likely due to #1.  Resolved.   Restless leg syndrome: Continue requip   Sepsis ruled out.  Subjective: No complaints, no dyspnea or pain.   Objective: Vitals:   06/22/22 0257 06/22/22 0459 06/22/22 0849 06/22/22 0850  BP:  124/60    Pulse:  (!) 59    Resp:  20    Temp:  97.9 F (36.6 C)    TempSrc:  Oral    SpO2:  98% 93% 92%  Weight: 83.6 kg     Height:       Gen: 66 y.o. male in no distress Pulm: Nonlabored breathing room air. Cleared. CV: Regular rate and rhythm. No murmur, rub, or gallop. No JVD, no dependent edema. GI: Abdomen soft, non-tender, non-distended, with normoactive bowel sounds.  Ext: Warm, no deformities Skin: No new rashes, lesions or ulcers on visualized skin. CT site c/d/i Neuro: Alert and oriented. No focal neurological deficits. Psych: Judgement and insight appear fair. Mood euthymic & affect congruent. Behavior is appropriate.    Data Personally reviewed:  CXR this AM shows improved/decreased effusion.  CBC: Recent Labs  Lab 06/16/22 0153 06/17/22 0349 06/18/22 0631 06/19/22 0254 06/20/22 0505 06/22/22 0404  WBC 16.5* 14.0* 13.5* 16.0* 12.5* 8.8  NEUTROABS 11.9* 9.6*  --   --  7.7 4.5  HGB 8.6* 8.7* 9.5* 9.1* 9.4* 8.5*  HCT 26.4* 26.6* 28.7* 27.5* 28.4* 26.1*  MCV 87.4 86.9 86.4  86.2 86.6 87.0  PLT 624* 551* 590* 566* 652* 123456*   Basic Metabolic Panel: Recent Labs  Lab 06/16/22 0153 06/17/22 0349 06/18/22 0631 06/19/22 0254 06/20/22 0505  NA 137 137 134* 131* 135  K 4.5 4.2 3.7 4.1 4.1  CL 104 101 102 99 103  CO2 21* 25 23 24 24   GLUCOSE 80 89 85 104* 82  BUN 17 17 13 12 11   CREATININE 1.38*  1.40* 1.29* 1.03 1.02 0.99  CALCIUM 8.1* 8.2* 8.0* 8.1* 8.1*  MG 1.7 1.7 1.7 1.7 1.8  PHOS 3.6 2.9 2.9 3.0 2.9   GFR: Estimated Creatinine Clearance: 76.8 mL/min (by C-G formula based on SCr of 0.99 mg/dL). Liver Function Tests: Recent Labs  Lab 06/16/22 0153 06/17/22 0349 06/18/22 0631 06/19/22 0254 06/20/22 0505  ALBUMIN 1.8* 1.7* 1.8* 1.7* 1.7*    Urine analysis:    Component Value Date/Time   COLORURINE YELLOW 06/14/2022 1210   APPEARANCEUR CLEAR 06/14/2022 1210   LABSPEC 1.010 06/14/2022 1210   PHURINE 6.0 06/14/2022 1210   GLUCOSEU NEGATIVE 06/14/2022 1210   HGBUR SMALL (A) 06/14/2022 1210   BILIRUBINUR NEGATIVE 06/14/2022 1210   KETONESUR NEGATIVE 06/14/2022 1210   PROTEINUR NEGATIVE 06/14/2022 1210   NITRITE NEGATIVE 06/14/2022 1210   LEUKOCYTESUR NEGATIVE 06/14/2022 1210   Recent Results (from the past 240 hour(s))  Culture, blood (Routine X 2) w Reflex to ID Panel     Status: None   Collection Time: 06/14/22  8:50 AM   Specimen: Left Antecubital; Blood  Result Value Ref Range Status   Specimen Description   Final    LEFT ANTECUBITAL BOTTLES DRAWN AEROBIC AND ANAEROBIC   Special Requests Blood Culture adequate volume  Final   Culture   Final    NO GROWTH 5 DAYS Performed at [facility], [address], [place], [place] [address]    Report Status 06/19/2022 FINAL  Final  SARS Coronavirus 2 by RT PCR (hospital order, performed in [facility] hospital lab) *cepheid single result test* Anterior Nasal Swab     Status: None   Collection Time: 06/14/22  8:58 AM   Specimen: Anterior Nasal Swab  Result Value Ref Range Status   SARS  Coronavirus 2 by RT PCR NEGATIVE NEGATIVE Final    Comment: (NOTE) SARS-CoV-2 target nucleic acids are NOT DETECTED.  The SARS-CoV-2 RNA is generally detectable in upper and lower respiratory specimens during the acute phase of infection. The lowest concentration of SARS-CoV-2 viral copies this assay can detect is 250 copies / mL. A negative result does not preclude SARS-CoV-2 infection and should not be used as the sole basis for treatment or other patient management decisions.  A negative result may occur with improper specimen collection / handling, submission of specimen other than nasopharyngeal swab, presence of viral mutation(s) within the areas targeted by this assay, and inadequate number of viral copies (<250 copies / mL). A negative result must be combined with clinical observations, patient history, and epidemiological information.  Fact Sheet for Patients:   https://www.patel.info/  Fact Sheet for Healthcare Providers: https://hall.com/  This test is not yet approved  or  cleared by the [place] and has been authorized for detection and/or diagnosis of SARS-CoV-2 by FDA under an Emergency Use Authorization (EUA).  This EUA will remain in effect (meaning this test can be used) for the duration of the COVID-19 declaration under Section 564(b)(1) of the Act, 21 U.S.C. section 360bbb-3(b)(1), unless the authorization is terminated or revoked sooner.  Performed at [facility], [address], [place], [place] [address]   Culture, blood (Routine X 2) w Reflex to ID Panel     Status: None   Collection Time: 06/14/22  9:09 AM   Specimen: BLOOD RIGHT HAND  Result Value Ref Range Status   Specimen Description   Final    BLOOD RIGHT HAND BOTTLES DRAWN AEROBIC AND ANAEROBIC   Special Requests   Final    Blood Culture results may not be optimal due to an excessive volume of blood received in culture bottles   Culture   Final     NO GROWTH 5 DAYS Performed at [facility], [address], [place], [place] [address]    Report Status 06/19/2022 FINAL  Final  MRSA Next Gen by PCR, Nasal     Status: Abnormal   Collection Time: 06/14/22  9:45 AM   Specimen: Nasal Mucosa; Nasal Swab  Result Value Ref Range Status   MRSA by PCR Next Gen DETECTED (A) NOT DETECTED Final    Comment: RESULT CALLED TO, READ BACK BY AND VERIFIED WITH: EANES M @ 1138 ON [id number] BY HENDERSON L (NOTE) The GeneXpert MRSA Assay (FDA approved for NASAL specimens only), is one component of a comprehensive MRSA colonization surveillance program. It is not intended to diagnose MRSA infection nor to guide or monitor treatment for MRSA infections. Test performance is not FDA approved in patients less than 70 years old. Performed at [facility], [address] [place], [place] [address]   Respiratory (~20 pathogens) panel by PCR     Status: None   Collection Time: 06/14/22  9:30 PM   Specimen: Nasopharyngeal Swab; Respiratory  Result Value Ref Range Status   Adenovirus NOT DETECTED NOT DETECTED Final   Coronavirus 229E NOT DETECTED NOT DETECTED Final    Comment: (NOTE) The Coronavirus on the Respiratory Panel, DOES NOT test for the novel  Coronavirus (2019 nCoV)    Coronavirus HKU1 NOT DETECTED NOT DETECTED Final   Coronavirus NL63 NOT DETECTED NOT DETECTED Final   Coronavirus OC43 NOT DETECTED NOT DETECTED Final   Metapneumovirus NOT DETECTED NOT DETECTED Final   Rhinovirus / Enterovirus NOT DETECTED NOT DETECTED Final   Influenza A NOT DETECTED NOT DETECTED Final   Influenza B NOT DETECTED NOT DETECTED Final   Parainfluenza Virus 1 NOT DETECTED NOT DETECTED Final   Parainfluenza Virus 2 NOT DETECTED NOT DETECTED Final   Parainfluenza Virus 3 NOT DETECTED NOT DETECTED Final   Parainfluenza Virus 4 NOT DETECTED NOT DETECTED Final   Respiratory Syncytial Virus NOT DETECTED NOT DETECTED Final   Bordetella pertussis NOT DETECTED NOT DETECTED  Final   Bordetella Parapertussis NOT DETECTED NOT DETECTED Final   Chlamydophila pneumoniae NOT DETECTED NOT DETECTED Final   Mycoplasma pneumoniae NOT DETECTED NOT DETECTED Final    Comment: Performed at [facility], [address], [place], [place] [address]  Aerobic/Anaerobic Culture w Gram Stain (surgical/deep wound)     Status: None   Collection Time: 06/16/22  1:14 PM   Specimen: Lung  Result Value Ref Range Status   Specimen Description  LUNG  Final   Special Requests NONE  Final   Gram Stain   Final    RARE WBC PRESENT,BOTH PMN AND MONONUCLEAR NO ORGANISMS SEEN    Culture   Final    No growth aerobically or anaerobically. Performed at [facility], [address] [place], [place] [address]    Report Status 06/21/2022 FINAL  Final  Body fluid culture w Gram Stain     Status: None   Collection Time: 06/16/22  4:55 PM   Specimen: Pleural Fluid  Result Value Ref Range Status   Specimen Description PLEURAL  Final   Special Requests NONE  Final   Gram Stain NO WBC SEEN NO ORGANISMS SEEN   Final   Culture   Final    NO GROWTH 3 DAYS Performed at [facility], [address], [place], [place] [address]    Report Status 06/20/2022 FINAL  Final     DG CHEST PORT 1 VIEW  Result Date: 06/21/2022 CLINICAL DATA:  Pleural effusion EXAM: PORTABLE CHEST 1 VIEW COMPARISON:  Previous studies including the examination of 06/19/2022 FINDINGS: There is interval decrease in left pleural effusion. Left chest tube is noted with its tip in the medial left lower lung field. There is no pneumothorax. Linear densities are seen in left lower lung field suggesting subsegmental atelectasis. Rest of the lung [place] are clear. There is no pneumothorax. Degenerative changes are noted in right shoulder. IMPRESSION: There is interval decrease in left pleural effusion. There is no pneumothorax. Electronically Signed   By: Ernie Avena M.D.   On: 06/21/2022 10:30    Family  Communication: None at bedside  Disposition: Status is: Inpatient Remains inpatient appropriate because: Continued chest tube management, discharge timing per pulmonology  Planned Discharge Destination: Home with Home Health  Tyrone Nine, MD 06/22/2022 2:11 PM Page by [email or web address]

## 2022-06-22 NOTE — Progress Notes (Signed)
  Subjective: No complaints  Objective: Vital signs in last 24 hours: Temp:  [97.8 F (36.6 C)-98.7 F (37.1 C)] 98.7 F (37.1 C) (09/10 1535) Pulse Rate:  [58-90] 72 (09/10 1535) Cardiac Rhythm: Normal sinus rhythm (09/10 0703) Resp:  [18-20] 18 (09/10 1535) BP: (110-139)/(44-78) 115/66 (09/10 1535) SpO2:  [83 %-100 %] 100 % (09/10 1535) Weight:  [83.6 kg] 83.6 kg (09/10 0257)  Hemodynamic parameters for last 24 hours:    Intake/Output from previous day: 09/09 0701 - 09/10 0700 In: 728 [P.O.:718] Out: 2490 [Urine:2350; Drains:140] Intake/Output this shift: No intake/output data recorded.  General appearance: alert, cooperative, and no distress No air leak although audible air movement in tubing  Lab Results: Recent Labs    06/20/22 0505 06/22/22 0404  WBC 12.5* 8.8  HGB 9.4* 8.5*  HCT 28.4* 26.1*  PLT 652* 639*   BMET:  Recent Labs    06/20/22 0505  NA 135  K 4.1  CL 103  CO2 24  GLUCOSE 82  BUN 11  CREATININE 0.99  CALCIUM 8.1*    PT/INR: No results for input(s): "LABPROT", "INR" in the last 72 hours. ABG    Component Value Date/Time   HCO3 31.6 (H) 06/04/2022 2032   O2SAT 30.9 06/04/2022 2032   CBG (last 3)  No results for input(s): "GLUCAP" in the last 72 hours.  Assessment/Plan:  Symptomatically improved post drainage/ thrombolytics Drainage trending down No air but air is audible moving in tubing If drainage remains low would consider removing tube tomorrow   LOS: 8 days    Loreli Slot 06/22/2022

## 2022-06-23 ENCOUNTER — Telehealth: Payer: Self-pay | Admitting: Critical Care Medicine

## 2022-06-23 ENCOUNTER — Inpatient Hospital Stay (HOSPITAL_COMMUNITY): Payer: Medicare PPO

## 2022-06-23 DIAGNOSIS — Z720 Tobacco use: Secondary | ICD-10-CM | POA: Diagnosis not present

## 2022-06-23 DIAGNOSIS — N179 Acute kidney failure, unspecified: Secondary | ICD-10-CM | POA: Diagnosis not present

## 2022-06-23 DIAGNOSIS — J449 Chronic obstructive pulmonary disease, unspecified: Secondary | ICD-10-CM | POA: Diagnosis not present

## 2022-06-23 DIAGNOSIS — J189 Pneumonia, unspecified organism: Secondary | ICD-10-CM | POA: Diagnosis not present

## 2022-06-23 DIAGNOSIS — D72829 Elevated white blood cell count, unspecified: Secondary | ICD-10-CM

## 2022-06-23 DIAGNOSIS — J90 Pleural effusion, not elsewhere classified: Secondary | ICD-10-CM | POA: Diagnosis not present

## 2022-06-23 LAB — CREATININE, SERUM
Creatinine, Ser: 1.02 mg/dL (ref 0.61–1.24)
GFR, Estimated: >60 mL/min (ref >60)

## 2022-06-23 MED ORDER — NICOTINE 21 MG/24HR TD PT24: 21.0000 mg | MEDICATED_PATCH | Freq: Every day | TRANSDERMAL | 0 refills | Status: DC

## 2022-06-23 MED ORDER — AMOXICILLIN-POT CLAVULANATE 875-125 MG PO TABS: 1.0000 | ORAL_TABLET | Freq: Two times a day (BID) | ORAL | 0 refills | Status: DC

## 2022-06-23 MED ORDER — INCRUSE ELLIPTA 62.5 MCG/ACT IN AEPB: 1.0000 | INHALATION_SPRAY | Freq: Every day | RESPIRATORY_TRACT | 0 refills | Status: AC

## 2022-06-23 MED ORDER — FLUTICASONE FUROATE-VILANTEROL 100-25 MCG/ACT IN AEPB: 1.0000 | INHALATION_SPRAY | Freq: Every day | RESPIRATORY_TRACT | 0 refills | Status: DC

## 2022-06-23 NOTE — Telephone Encounter (Signed)
Pt scheduled to see Rhunette Croft on 9/19.

## 2022-06-23 NOTE — Progress Notes (Signed)
   NAME:  David Callahan, MRN:  [id number], DOB:  1956/06/25, LOS: 9 ADMISSION DATE:  06/14/2022, CONSULTATION DATE:  06/14/2022 REFERRING MD:  Tyrone Nine, MD, CHIEF COMPLAINT:  pleural effusion  History of Present Illness:  31 y9o man with history of COPD and ongoing tobacco use disorder. Recent hospitalization for COPD exacerbation and left pleural effusion in August 2023. This was drained by IR, but not fully drained - was exudative. He was discharged home on abx 8/26 and represented to the ED at [facility] 9/2 for worsening shortness of breath. Found to have persistent let pleural effusion,   Pertinent  Medical History  Copd Tobacco use disorder  Significant Hospital Events: Including procedures, antibiotic start and stop dates in addition to other pertinent events   9/4 CT guided pigtail by IR Plan for CT without contrast CT of the chest 06/20/2022> persistent small pneumothorax, pleural thickening  Interim History / Subjective:  [name] denies complaints. He is breathing well. Has not heard any bubbling in chest tube.  Objective   Blood pressure 139/69, pulse 63, temperature 97.8 F (36.6 C), temperature source Oral, resp. rate 19, height 5\' 10"  (1.778 m), weight 82.3 kg, SpO2 100 %.        Intake/Output Summary (Last 24 hours) at 06/23/2022 0842 Last data filed at 06/23/2022 0500 Gross per 24 hour  Intake 250 ml  Output 2950 ml  Net -2700 ml    Filed Weights   06/21/22 0507 06/22/22 0257 06/23/22 0453  Weight: 83.7 kg 83.6 kg 82.3 kg    Examination: General: elderly man sitting up in bed in NAD HEENT: [place]/AT, eyes anicteric Neuro: awake, alert, moving all extremities, sitting up in bed independently CV: S1S2, RRR PULM: rhales on the left, rhonchi cleared with coughing, chest tube with minimal output GI: soft, NT Extremities:  no edema or cyanosis Skin: no rashes or wounds  CXR personally reviewed> small left effusion vs pleural thickening, chest tube remains in place.    Resolved Hospital Problem list     Assessment & Plan:    L complicated parapnuemonic effusion COPD with ongoing tobacco use disorder  Plan: -Ok to remove chest tube today -Complete outpatient course of antibiotics- Augmentin, planning 3 weeks -resume OP Breo + Incruse at discharge. As an outpatient can consider if continued ICS is appropriate with recurrent pneumonias. -Requested outpatient follow up in the office next week with CXR -Ok to discharge from pulmonary standpoint -Counseled on the importance of total tobacco avoidance; barrier to doing this is many of his social contacts smoke.     08/23/22, DO 06/23/22 10:20 AM [facility]

## 2022-06-23 NOTE — Discharge Summary (Signed)
Physician Discharge Summary   Patient: David Callahan MRN: [id number] DOB: 11/24/1955  Admit date:     06/14/2022  Discharge date: 06/23/22  Discharge Physician: Tyrone Nineyan B [name]   PCP: Patient, No Pcp Per   Recommendations at discharge:  Follow up with [facility] in the next week with repeat CXR. Continuing augmentin x3 total weeks.  Follow up with PCP in 1-2 weeks. Suggest recheck CBC and BMP.  Continued tobacco cessation counseling/assistance.  Discharge Diagnoses: Principal Problem:   Pleural effusion Active Problems:   COPD (chronic obstructive pulmonary disease) (HCC)   Leukocytosis   CAP (community acquired pneumonia)   AKI (acute kidney injury) [facility](HCC)  Hospital Course: David Callahan is a 66 y.o. male PMH of COPD, HTN, tobacco use disorder, recurrent pleural effusion, recent hospitalization from 8/23-28 for CAP/exudative pleural effusion s/p thoracocentesis, and AKI returning to [facility] with increased shortness of breath, dizziness and low blood pressure, and admitted for recurrent exudative left pleural effusion.  CT chest showed moderate left loculated pleural effusion with gas.  He has WBC of 25 with left shift.  Creatinine 2.4 (1.3 on discharge recently).  Lactic acid 2.5.  Patient was started on vancomycin and cefepime.  Pulmonology consulted.  Patient was [facility] for further evaluation and management.   PCCM consulted, unsuccessful CT insertion 9/3. IR consulted and had CT-guided chest tube placed on 9/4 with removal of 700 cc exudative and cultures negative fluid right away.  Received fibrinolytics on 9/5, 9/6 and 9/7, repeat CT shows failure of lung to reexpand. Cardiothoracic surgery was consulted, recommending continued conservative management for now in lieu of surgical decortication. Over the weekend, the patient's respiratory status continued improving, lung exam improved, CXR showing improvement. Chest tube discontinuation  recommended by CTS and PCCM, and the patient is cleared for discharge.  Assessment and Plan: Community-acquired pneumonia with parapneumonic effusion: POA.  Hospitalized 8/23-28 and had thoracocentesis with removal of exudative and cultures negative fluid, cytology negative.  He was discharged on Levaquin.  Returns with SOB, hypotension and near syncope.  Had leukocytosis to 25 with left shift.  Lactic acidosis of 2.5.  CT chest as above. RVP and COVID-19 PCR negative.  Blood and pleural fluid cultures negative.  - s/p CT-guided chest tube by IR on 9/4. s/p lytics 9/5, 9/6, 9/7. Output declining, ok to pull tube 9/11 per PCCM and CTS. Ok to DC today per PCCM, will follow up in 1 week. - Continue antibiotics for approximate 21 day course currently planned, on augmentin since 9/8. Started 9/2 with vancomycin/cefepime/flagyl thru 9/7.  - Pulmonary toilet - CT surgery advised against decortication.    Chronic COPD/tobacco abuse: No current wheezing. - Continue bronchodilators. Continue breo and incruse at discharge per PCCM recommendation, outpatient pulmonary to consider continued ICS appropriateness in light of recurrent pneumonias. Due to concern for insurance noncoverage, if patient is unable to obtain these medications, he will be directed to continue home trelegy ellipta instead until follow up. - Encouraged smoking cessation. Nicotine patch prescribed.   Hypotension/history of hypertension: Hypotension resolved.   - Continue holding home benazepril in the setting of AKI and Cardizem in the setting of borderline heart rate which has normalized.  - Started low-dose amlodipine. Will revert to home meds at discharge   AKI: Likely prerenal in the setting of sepsis and hypotension. Resolved.   Anemia of chronic disease with iron deficiency: Drop in Hgb likely dilutional from IVF.  Iron saturation 7%. Overall stable  in 8's.  - Monitor at follow up.   Thrombocytosis: Likely reactive. - Monitor at  follow up.  Leukocytosis: Noted to also have been a chronic problem seen by Dr. Ellin Saba. Currently resolved, WBC 8.8k (PMN 4.5k, lymph 3.3k)   Lactic acidosis: Likely due to #1.  Resolved.   Restless leg syndrome: Continue requip   Sepsis ruled out.  Consultants: Pulmonary, Cardiothoracic surgery, IR Procedures performed: Attempted CT insertion 9/3 by PCCM, CT insertion 9/4 by IR. Removal 9/11.  Disposition: Home Diet recommendation:  Cardiac diet DISCHARGE MEDICATION: Allergies as of 06/23/2022       Reactions   Motrin [ibuprofen] Swelling   Anti-inflammatory analgesics- cause anaphylaxis        Medication List     STOP taking these medications    budesonide-formoterol 160-4.5 MCG/ACT inhaler Commonly known as: SYMBICORT   levofloxacin 750 MG tablet Commonly known as: LEVAQUIN   Trelegy Ellipta 200-62.5-25 MCG/ACT Aepb Generic drug: Fluticasone-Umeclidin-Vilant       TAKE these medications    albuterol 108 (90 Base) MCG/ACT inhaler Commonly known as: VENTOLIN HFA Inhale 2 puffs into the lungs every 6 (six) hours as needed for shortness of breath.   amoxicillin-clavulanate 875-125 MG tablet Commonly known as: AUGMENTIN Take 1 tablet by mouth every 12 (twelve) hours.   atorvastatin 40 MG tablet Commonly known as: LIPITOR Take 40 mg by mouth daily.   benazepril 40 MG tablet Commonly known as: LOTENSIN Take 40 mg by mouth daily.   Combivent Respimat 20-100 MCG/ACT Aers respimat Generic drug: Ipratropium-Albuterol Inhale 1 puff into the lungs every 6 (six) hours as needed for wheezing.   cyclobenzaprine 10 MG tablet Commonly known as: FLEXERIL Take 10 mg by mouth at bedtime.   dextromethorphan-guaiFENesin 30-600 MG 12hr tablet Commonly known as: MUCINEX DM Take 1 tablet by mouth 2 (two) times daily.   diltiazem 300 MG 24 hr capsule Commonly known as: CARDIZEM CD Take 300 mg by mouth daily.   fluticasone furoate-vilanterol 100-25 MCG/ACT  Aepb Commonly known as: Breo Ellipta Inhale 1 puff into the lungs daily.   HYDROcodone-acetaminophen 5-325 MG tablet Commonly known as: NORCO/VICODIN Take 2 tablets by mouth every 6 (six) hours as needed for moderate pain or severe pain.   Incruse Ellipta 62.5 MCG/ACT Aepb Generic drug: umeclidinium bromide Inhale 1 puff into the lungs daily.   nicotine 21 mg/24hr patch Commonly known as: NICODERM CQ - dosed in mg/24 hours Place 1 patch (21 mg total) onto the skin daily. Start taking on: June 24, 2022   rOPINIRole 0.25 MG tablet Commonly known as: REQUIP Take 0.75 mg by mouth at bedtime.   traZODone 50 MG tablet Commonly known as: DESYREL Take 50 mg by mouth at bedtime.        Follow-up Information     [facility]. Schedule an appointment as soon as possible for a visit in 1 week(s).   Specialty: Pulmonology Contact information: [address] [place] [address] [phone]               Discharge Exam: Filed Weights   06/21/22 0507 06/22/22 0257 06/23/22 0453  Weight: 83.7 kg 83.6 kg 82.3 kg  BP 132/64   Pulse 80   Temp 98.4 F (36.9 C) (Oral)   Resp 18   Ht 5\' 10"  (1.778 m)   Wt 82.3 kg   SpO2 100%   BMI 26.03 kg/m   Well-appearing male in no distress Clear, nonlabored No discharge or significant  erythema at Left lateral chest tube site.  RRR, no MRG or edema  Condition at discharge: stable  The results of significant diagnostics from this hospitalization (including imaging, microbiology, ancillary and laboratory) are listed below for reference.   Imaging Studies: DG Chest Port 1 View  Result Date: 06/23/2022 CLINICAL DATA:  Pleural effusion. Status post left chest tube placement. EXAM: PORTABLE CHEST 1 VIEW COMPARISON:  06/22/2022 FINDINGS: Unchanged position of left basilar chest tube. No pneumothorax identified. Small residual left pleural effusion appears unchanged from previous exam. Left  basilar atelectasis is also unchanged. Right lung appears clear. IMPRESSION: 1. Stable left chest tube placement. 2. No change in small left pleural effusion and left basilar atelectasis. Electronically Signed   By: Signa Kell M.D.   On: 06/23/2022 06:41   DG CHEST PORT 1 VIEW  Result Date: 06/22/2022 CLINICAL DATA:  Empyema. EXAM: PORTABLE CHEST 1 VIEW COMPARISON:  06/21/2022 FINDINGS: Percutaneous LEFT pigtail thoracostomy tube is unchanged. Small LEFT pleural effusion/pleural thickening again noted with mild LEFT basilar atelectasis. There is no evidence of pneumothorax. Cardiomediastinal silhouette is unchanged. The RIGHT lung is clear. No acute bony abnormalities are present. IMPRESSION: Unchanged appearance of the chest with LEFT thoracostomy tube, small LEFT pleural effusion/pleural thickening and LEFT basilar atelectasis. No evidence of pneumothorax. Electronically Signed   By: Harmon Pier M.D.   On: 06/22/2022 15:28   DG CHEST PORT 1 VIEW  Result Date: 06/21/2022 CLINICAL DATA:  Pleural effusion EXAM: PORTABLE CHEST 1 VIEW COMPARISON:  Previous studies including the examination of 06/19/2022 FINDINGS: There is interval decrease in left pleural effusion. Left chest tube is noted with its tip in the medial left lower lung field. There is no pneumothorax. Linear densities are seen in left lower lung field suggesting subsegmental atelectasis. Rest of the lung Simons are clear. There is no pneumothorax. Degenerative changes are noted in right shoulder. IMPRESSION: There is interval decrease in left pleural effusion. There is no pneumothorax. Electronically Signed   By: Ernie Avena M.D.   On: 06/21/2022 10:30   CT CHEST WO CONTRAST  Result Date: 06/20/2022 CLINICAL DATA:  Empyema EXAM: CT CHEST WITHOUT CONTRAST TECHNIQUE: Multidetector CT imaging of the chest was performed following the standard protocol without IV contrast. RADIATION DOSE REDUCTION: This exam was performed according to the  departmental dose-optimization program which includes automated exposure control, adjustment of the mA and/or kV according to patient size and/or use of iterative reconstruction technique. COMPARISON:  06/14/2022 FINDINGS: Cardiovascular: Coronary, aortic arch, and branch vessel atherosclerotic vascular disease. Mediastinum/Nodes: Calcified left infrahilar nodes compatible with old granulomatous disease. Lungs/Pleura: Subsegmental atelectasis posteriorly in the right lower lobe with increased plugging of right lower lobe airways as shown on image 99 series 6. There is some frothy fluid in the bronchus intermedius dependently. Pigtail left pleural drainage catheter in place posteriorly, within a left hydropneumothorax encompassing about 30% of left hemithoracic volume. There is substantial pleural thickening along the parietal and visceral pleural margins compatible with empyema, as well as peripheral subpleural atelectasis in the left lung. The pleural drainage tube does not appear kinked. The current hydropneumothorax is reduced in size compared to the previous large empyema shown on 06/14/2022. Upper Abdomen: Punctate calcifications in the spleen compatible with old granulomatous disease. Abdominal aortic atherosclerosis. Musculoskeletal: Severe right and moderate left degenerative glenohumeral arthropathy. IMPRESSION: 1. Left pleural drainage catheter in place with approximately 30% left hydropneumothorax, overall improved in volume compared to the empyema shown on 06/14/2022. There is pleural thickening compatible  with inflammation/empyema, as well as peripheral subpleural atelectasis in the left lung. 2. Other imaging findings of potential clinical significance: Aortic Atherosclerosis (ICD10-I70.0). Coronary atherosclerosis. Old granulomatous disease. Right greater than left degenerative glenohumeral arthropathy. Electronically Signed   By: Gaylyn Rong M.D.   On: 06/20/2022 09:35   DG CHEST PORT 1  VIEW  Result Date: 06/19/2022 CLINICAL DATA:  Left chest tube EXAM: PORTABLE CHEST 1 VIEW COMPARISON:  Chest x-ray dated June 18, 2022 FINDINGS: Cardiac and mediastinal contours are within normal limits. Small loculated left pleural effusion, increased in size when compared with prior exam. Left-sided chest tube in place. No evidence of pneumothorax. Left basilar lung opacities, likely due to atelectasis. IMPRESSION: Small loculated left pleural effusion, increased in size. Left-sided chest tube in place. Electronically Signed   By: Allegra Lai M.D.   On: 06/19/2022 08:26   DG Chest Port 1 View  Result Date: 06/18/2022 CLINICAL DATA:  Empyema lung.  Left chest tube. EXAM: PORTABLE CHEST 1 VIEW COMPARISON:  AP chest 06/17/2022 and 06/15/2022; CT chest 06/14/2022 FINDINGS: Left basilar pigtail drainage catheter is unchanged in position. Cardiac silhouette and mediastinal contours are within normal limits. Moderate thickening of the left mid to lower pleura and left basilar pleura is similar to 06/17/2022 frontal radiograph after chest tube placement and again decreased from 06/15/2022 radiograph. The right lung is clear. No pneumothorax. Mild dextrocurvature of the mid to lower thoracic spine. Mild-to-moderate multilevel degenerative disc changes. IMPRESSION: 1. Left basilar pigtail drainage catheter is unchanged in position. 2. Moderate left lateral and lower pleural thickening/loculated pleural fluid is unchanged from 06/17/2022. Electronically Signed   By: Neita Garnet M.D.   On: 06/18/2022 08:47   DG Chest Port 1 View  Result Date: 06/17/2022 CLINICAL DATA:  Left chest tube EXAM: PORTABLE CHEST 1 VIEW COMPARISON:  Radiograph 06/15/2022 FINDINGS: Unchanged cardiomediastinal silhouette. Slightly decreased left pleural effusion and adjacent basilar opacities with left basilar chest tube in place. No pneumothorax. Right lung is clear. Bones are unchanged including severe right glenohumeral  osteoarthritis. IMPRESSION: Slightly decreased left pleural effusion with adjacent basilar opacities with basilar pigtail chest tube in place Electronically Signed   By: Caprice Renshaw M.D.   On: 06/17/2022 08:17   CT [facility] PLEURAL DRAIN W/INDWELL CATH W/IMG GUIDE  Result Date: 06/17/2022 INDICATION: Loculated LEFT pleural effusion EXAM: CT-GUIDED LEFT PIGTAIL THORACOSTOMY CATHETER PLACEMENT COMPARISON:  None Available. MEDICATIONS: The patient is currently admitted to the hospital and receiving intravenous antibiotics. The antibiotics were administered within an appropriate time frame prior to the initiation of the procedure. ANESTHESIA/SEDATION: Moderate (conscious) sedation was employed during this procedure. A total of Versed 2 mg and Fentanyl 100 mcg was administered intravenously. Moderate Sedation Time: 20 minutes. The patient's level of consciousness and vital signs were monitored continuously by radiology nursing throughout the procedure under my direct supervision. CONTRAST:  None COMPLICATIONS: None immediate. PROCEDURE: RADIATION DOSE REDUCTION: This exam was performed according to the departmental dose-optimization program which includes automated exposure control, adjustment of the mA and/or kV according to patient size and/or use of iterative reconstruction technique. Informed written consent was obtained from the patient and/or patient's representative after a discussion of the risks, benefits and alternatives to treatment. The patient was placed wedged supine on the CT gantry and a pre procedural CT was performed re-demonstrating the known fluid collection within the LEFT chest. The procedure was planned. A timeout was performed prior to the initiation of the procedure. The lateral LEFT chest was prepped and  draped in the usual sterile fashion. The overlying soft tissues were anesthetized with 1% lidocaine with epinephrine. Appropriate trajectory was planned with the use of a 22 gauge spinal needle.  An 18 gauge trocar needle was advanced into the fluid collection and a short Amplatz super stiff wire was coiled within the collection. Appropriate positioning was confirmed with a limited CT scan. The tract was serially dilated allowing placement of a 14 Fr drainage catheter. Appropriate positioning was confirmed with a limited postprocedural CT scan. 40 mL ml of serous pleural fluid was aspirated. The tube was connected to a pleura vac suction and sutured in place. A dressing was placed. The patient tolerated the procedure well without immediate post procedural complication. IMPRESSION: Successful CT guided placement of a 14 Fr LEFT pigtail thoracostomy drainage catheter for fibrinolytic therapy of a loculated LEFT pleural effusion. Samples were sent to the laboratory as requested by the ordering clinical team. Roanna Banning, MD [facility] Radiology Electronically Signed   By: Roanna Banning M.D.   On: 06/17/2022 08:06   DG Chest Port 1 View  Result Date: 06/15/2022 CLINICAL DATA:  Left pleural effusion.  Attempted thoracentesis. EXAM: PORTABLE CHEST 1 VIEW COMPARISON:  06/14/2022 FINDINGS: Stable loculated left pleural effusion. Left base collapse/consolidation similar to prior. No evidence for pneumothorax. Right lung clear. The cardiopericardial silhouette is within normal limits for size. Telemetry leads overlie the chest. IMPRESSION: No evidence for pneumothorax status post attempted left thoracentesis. Electronically Signed   By: Kennith Center M.D.   On: 06/15/2022 16:46   CT Chest Wo Contrast  Result Date: 06/14/2022 CLINICAL DATA:  Pneumonia. EXAM: CT CHEST WITHOUT CONTRAST TECHNIQUE: Multidetector CT imaging of the chest was performed following the standard protocol without IV contrast. RADIATION DOSE REDUCTION: This exam was performed according to the departmental dose-optimization program which includes automated exposure control, adjustment of the mA  and/or kV according to patient size and/or use of iterative reconstruction technique. COMPARISON:  June 04, 2022. FINDINGS: Cardiovascular: Atherosclerosis of thoracic aorta is noted without aneurysm formation. Normal cardiac size. No pericardial effusion. Mild coronary artery calcifications are noted. Mediastinum/Nodes: Thyroid gland and esophagus are unremarkable. Stable calcified adenopathy is noted most consistent with prior granulomatous disease. Lungs/Pleura: Right lung is clear. No definite pneumothorax is noted. Moderate size loculated left pleural effusion is again noted; there does appear to be the interval development of several foci of gas within this fluid collection which may represent either infection or attempted intervention. Mild associated atelectasis of the left upper and lower lobes is noted. Upper Abdomen: Calcified splenic granulomas are again noted. Musculoskeletal: No chest wall mass or suspicious bone lesions identified. IMPRESSION: Moderate size loculated left pleural effusion is again noted and not significantly changed in size. There does appear to be the interval development of several foci of gas within the fluid collection which may represent either infection or attempted iatrogenic intervention Mild coronary artery calcifications are noted. Aortic Atherosclerosis (ICD10-I70.0). Electronically Signed   By: Lupita Raider M.D.   On: 06/14/2022 09:59   DG Chest Port 1 View  Result Date: 06/14/2022 CLINICAL DATA:  Hypotension with generalized weakness. EXAM: PORTABLE CHEST 1 VIEW COMPARISON:  06/05/2022 FINDINGS: Right lung clear. Loculated left pleural effusion is progressive in the interval, now moderate to large in size with associated left base collapse/consolidation. Cardiopericardial silhouette is at upper limits of normal for size. The visualized bony structures of the thorax are unremarkable. Telemetry leads overlie the chest. IMPRESSION: Interval  progression of loculated  left pleural effusion, now moderate to large in size. Left base collapse/consolidation. Electronically Signed   By: Kennith Center M.D.   On: 06/14/2022 09:04   DG Chest 1 View  Result Date: 06/05/2022 CLINICAL DATA:  Partially loculated LEFT pleural effusion post thoracentesis EXAM: CHEST  1 VIEW COMPARISON:  12/10/2021 FINDINGS: Normal heart size, mediastinal contours, and pulmonary vascularity. Atherosclerotic calcification aorta. Small to moderate LEFT pleural effusion and basilar atelectasis. No pneumothorax following thoracentesis. RIGHT lung clear. Advanced RIGHT glenohumeral degenerative changes. IMPRESSION: No pneumothorax following LEFT thoracentesis. Small to moderate LEFT pleural effusion and basilar atelectasis. Aortic Atherosclerosis (ICD10-I70.0). Electronically Signed   By: Ulyses Southward M.D.   On: 06/05/2022 09:59   US THORACENTESIS ASP PLEURAL SPACE W/IMG GUIDE  Result Date: 06/05/2022 INDICATION: Left pleural effusion EXAM: ULTRASOUND GUIDED LEFT THORACENTESIS MEDICATIONS: 9 cc 1% lidocaine. COMPLICATIONS: None immediate. PROCEDURE: An ultrasound guided thoracentesis was thoroughly discussed with the patient and questions answered. The benefits, risks, alternatives and complications were also discussed. The patient understands and wishes to proceed with the procedure. Written consent was obtained. Ultrasound was performed to localize and mark an adequate pocket of fluid in the Left chest. The area was then prepped and draped in the normal sterile fashion. 1% Lidocaine was used for local anesthesia. Under ultrasound guidance a Yueh catheter was introduced. Thoracentesis was performed. The catheter was removed and a dressing applied. FINDINGS: A total of approximately 650 cc of yellow fluid was removed. Samples were sent to the laboratory as requested by the clinical team. IMPRESSION: Successful ultrasound guided left thoracentesis yielding 650 cc of pleural fluid. Post CXR: No PTX per Dr Tyron Russell  Read by Robet Leu [facility] Electronically Signed   By: Ulyses Southward M.D.   On: 06/05/2022 09:52   CT Chest Wo Contrast  Result Date: 06/04/2022 CLINICAL DATA:  Pneumonia, complication suspected, xray done EXAM: CT CHEST WITHOUT CONTRAST TECHNIQUE: Multidetector CT imaging of the chest was performed following the standard protocol without IV contrast. RADIATION DOSE REDUCTION: This exam was performed according to the departmental dose-optimization program which includes automated exposure control, adjustment of the mA and/or kV according to patient size and/or use of iterative reconstruction technique. COMPARISON:  12/10/2021. FINDINGS: Cardiovascular: Moderate coronary artery and aortic calcifications. Heart is normal size. Aorta is normal caliber. Mediastinum/Nodes: No mediastinal, hilar, or axillary adenopathy. Trachea and esophagus are unremarkable. Thyroid unremarkable. Lungs/Pleura: Moderate loculated left pleural effusion. Airspace disease in the left lower lobe and to a lesser extent lingula which could reflect atelectasis or pneumonia. Right lung clear. Upper Abdomen: Calcifications in the liver and spleen compatible with old granulomatous disease. Musculoskeletal: Chest wall soft tissues are unremarkable. No acute bony abnormality. IMPRESSION: Moderate loculated left pleural effusion. Airspace disease in the left lower lobe concerning for pneumonia. Lingular opacity likely reflects atelectasis. Old granulomatous disease. Coronary artery disease. Aortic Atherosclerosis (ICD10-I70.0). Electronically Signed   By: Charlett Nose M.D.   On: 06/04/2022 21:54    Microbiology: Results for orders placed or performed during the hospital encounter of 06/14/22  Culture, blood (Routine X 2) w Reflex to ID Panel     Status: None   Collection Time: 06/14/22  8:50 AM   Specimen: Left Antecubital; Blood  Result Value Ref Range Status   Specimen Description   Final    LEFT ANTECUBITAL BOTTLES DRAWN AEROBIC AND  ANAEROBIC   Special Requests Blood Culture adequate volume  Final   Culture   Final  NO GROWTH 5 DAYS Performed at [facility], [address] [place], [place] [address]    Report Status 06/19/2022 FINAL  Final  SARS Coronavirus 2 by RT PCR (hospital order, performed in [facility] hospital lab) *cepheid single result test* Anterior Nasal Swab     Status: None   Collection Time: 06/14/22  8:58 AM   Specimen: Anterior Nasal Swab  Result Value Ref Range Status   SARS Coronavirus 2 by RT PCR NEGATIVE NEGATIVE Final    Comment: (NOTE) SARS-CoV-2 target nucleic acids are NOT DETECTED.  The SARS-CoV-2 RNA is generally detectable in upper and lower respiratory specimens during the acute phase of infection. The lowest concentration of SARS-CoV-2 viral copies this assay can detect is 250 copies / mL. A negative result does not preclude SARS-CoV-2 infection and should not be used as the sole basis for treatment or other patient management decisions.  A negative result may occur with improper specimen collection / handling, submission of specimen other than nasopharyngeal swab, presence of viral mutation(s) within the areas targeted by this assay, and inadequate number of viral copies (<250 copies / mL). A negative result must be combined with clinical observations, patient history, and epidemiological information.  Fact Sheet for Patients:   RoadLapTop.co.za  Fact Sheet for Healthcare Providers: http://kim-miller.com/  This test is not yet approved or  cleared by the [place] FDA and has been authorized for detection and/or diagnosis of SARS-CoV-2 by FDA under an Emergency Use Authorization (EUA).  This EUA will remain in effect (meaning this test can be used) for the duration of the COVID-19 declaration under Section 564(b)(1) of the Act, 21 U.S.C. section 360bbb-3(b)(1), unless the authorization is terminated or revoked  sooner.  Performed at [facility], [address] [place], [place] [address]   Culture, blood (Routine X 2) w Reflex to ID Panel     Status: None   Collection Time: 06/14/22  9:09 AM   Specimen: BLOOD RIGHT HAND  Result Value Ref Range Status   Specimen Description   Final    BLOOD RIGHT HAND BOTTLES DRAWN AEROBIC AND ANAEROBIC   Special Requests   Final    Blood Culture results may not be optimal due to an excessive volume of blood received in culture bottles   Culture   Final    NO GROWTH 5 DAYS Performed at [facility], [address], [place], [place] [address]    Report Status 06/19/2022 FINAL  Final  MRSA Next Gen by PCR, Nasal     Status: Abnormal   Collection Time: 06/14/22  9:45 AM   Specimen: Nasal Mucosa; Nasal Swab  Result Value Ref Range Status   MRSA by PCR Next Gen DETECTED (A) NOT DETECTED Final    Comment: RESULT CALLED TO, READ BACK BY AND VERIFIED WITH: EANES M @ 1138 ON [id number] BY HENDERSON L (NOTE) The GeneXpert MRSA Assay (FDA approved for NASAL specimens only), is one component of a comprehensive MRSA colonization surveillance program. It is not intended to diagnose MRSA infection nor to guide or monitor treatment for MRSA infections. Test performance is not FDA approved in patients less than 43 years old. Performed at [facility], [address], [place], [place] [address]   Respiratory (~20 pathogens) panel by PCR     Status: None   Collection Time: 06/14/22  9:30 PM   Specimen: Nasopharyngeal Swab; Respiratory  Result Value Ref Range Status   Adenovirus NOT DETECTED NOT DETECTED Final   Coronavirus 229E  NOT DETECTED NOT DETECTED Final    Comment: (NOTE) The Coronavirus on the Respiratory Panel, DOES NOT test for the novel  Coronavirus (2019 nCoV)    Coronavirus HKU1 NOT DETECTED NOT DETECTED Final   Coronavirus NL63 NOT DETECTED NOT DETECTED Final   Coronavirus OC43 NOT DETECTED NOT DETECTED Final   Metapneumovirus NOT DETECTED NOT DETECTED Final    Rhinovirus / Enterovirus NOT DETECTED NOT DETECTED Final   Influenza A NOT DETECTED NOT DETECTED Final   Influenza B NOT DETECTED NOT DETECTED Final   Parainfluenza Virus 1 NOT DETECTED NOT DETECTED Final   Parainfluenza Virus 2 NOT DETECTED NOT DETECTED Final   Parainfluenza Virus 3 NOT DETECTED NOT DETECTED Final   Parainfluenza Virus 4 NOT DETECTED NOT DETECTED Final   Respiratory Syncytial Virus NOT DETECTED NOT DETECTED Final   Bordetella pertussis NOT DETECTED NOT DETECTED Final   Bordetella Parapertussis NOT DETECTED NOT DETECTED Final   Chlamydophila pneumoniae NOT DETECTED NOT DETECTED Final   Mycoplasma pneumoniae NOT DETECTED NOT DETECTED Final    Comment: Performed at [facility], [address], [place], [place] [address]  Aerobic/Anaerobic Culture w Gram Stain (surgical/deep wound)     Status: None   Collection Time: 06/16/22  1:14 PM   Specimen: Lung  Result Value Ref Range Status   Specimen Description LUNG  Final   Special Requests NONE  Final   Gram Stain   Final    RARE WBC PRESENT,BOTH PMN AND MONONUCLEAR NO ORGANISMS SEEN    Culture   Final    No growth aerobically or anaerobically. Performed at [facility], [address] [place], [place] [address]    Report Status 06/21/2022 FINAL  Final  Body fluid culture w Gram Stain     Status: None   Collection Time: 06/16/22  4:55 PM   Specimen: Pleural Fluid  Result Value Ref Range Status   Specimen Description PLEURAL  Final   Special Requests NONE  Final   Gram Stain NO WBC SEEN NO ORGANISMS SEEN   Final   Culture   Final    NO GROWTH 3 DAYS Performed at [facility], [address] [place], [place] [address]    Report Status 06/20/2022 FINAL  Final    Labs: CBC: Recent Labs  Lab 06/17/22 0349 06/18/22 0631 06/19/22 0254 06/20/22 0505 06/22/22 0404  WBC 14.0* 13.5* 16.0* 12.5* 8.8  NEUTROABS 9.6*  --   --  7.7 4.5  HGB 8.7* 9.5* 9.1* 9.4* 8.5*  HCT 26.6* 28.7* 27.5* 28.4*  26.1*  MCV 86.9 86.4 86.2 86.6 87.0  PLT 551* 590* 566* 652* 639*   Basic Metabolic Panel: Recent Labs  Lab 06/17/22 0349 06/18/22 0631 06/19/22 0254 06/20/22 0505 06/23/22 0530  NA 137 134* 131* 135  --   K 4.2 3.7 4.1 4.1  --   CL 101 102 99 103  --   CO2 25 23 24 24   --   GLUCOSE 89 85 104* 82  --   BUN 17 13 12 11   --   CREATININE 1.29* 1.03 1.02 0.99 1.02  CALCIUM 8.2* 8.0* 8.1* 8.1*  --   MG 1.7 1.7 1.7 1.8  --   PHOS 2.9 2.9 3.0 2.9  --    Liver Function Tests: Recent Labs  Lab 06/17/22 0349 06/18/22 0631 06/19/22 0254 06/20/22 0505  ALBUMIN 1.7* 1.8* 1.7* 1.7*   CBG: No results for input(s): "GLUCAP" in the last 168 hours.  Discharge time spent: greater than 30 minutes.  Signed: Tyrone Nine, MD [facility] 06/23/2022

## 2022-06-23 NOTE — Progress Notes (Signed)
Left pig-tail chest tube removed per request from primary team. Patient with plans for discharge home today. Left chest tube removed at the bedside without incident. Post-removal portable CXR ordered.  Primary team notified of removal and order for CXR.  Alwyn Ren, [place] [phone] 06/23/2022, 11:57 AM

## 2022-06-23 NOTE — Plan of Care (Signed)
  Problem: Education: Goal: Knowledge of General Education information will improve Description: Including pain rating scale, medication(s)/side effects and non-pharmacologic comfort measures Outcome: Progressing   Problem: Health Behavior/Discharge Planning: Goal: Ability to manage health-related needs will improve Outcome: Progressing   Problem: Clinical Measurements: Goal: Respiratory complications will improve Outcome: Progressing   Problem: Clinical Measurements: Goal: Cardiovascular complication will be avoided Outcome: Progressing   Problem: Pain Managment: Goal: General experience of comfort will improve Outcome: Progressing   Problem: Safety: Goal: Ability to remain free from injury will improve Outcome: Progressing   Problem: Skin Integrity: Goal: Risk for impaired skin integrity will decrease Outcome: Progressing   Problem: Respiratory: Goal: Ability to maintain a clear airway will improve Outcome: Progressing Goal: Levels of oxygenation will improve Outcome: Progressing Goal: Ability to maintain adequate ventilation will improve Outcome: Progressing

## 2022-06-23 NOTE — Telephone Encounter (Signed)
OP follow up with [facility] Pulmonology requested next week.  Steffanie Dunn, DO 06/23/22 10:09 AM [facility]

## 2022-06-23 NOTE — Care Management Important Message (Signed)
Important Message  Patient Details  Name: David Callahan MRN: [id number] Date of Birth: December 26, 1955   Medicare Important Message Given:  Yes     Renie Ora 06/23/2022, 10:12 AM

## 2022-06-23 NOTE — Progress Notes (Signed)
      [address]       [place] [address]             [phone]       Subjective:  Patient doing well.   Has no complaints.  Just waiting on breakfast.  Objective: Vital signs in last 24 hours: Temp:  [97.8 F (36.6 C)-98.7 F (37.1 C)] 97.8 F (36.6 C) (09/11 0447) Pulse Rate:  [63-79] 63 (09/11 0447) Cardiac Rhythm: Normal sinus rhythm (09/10 1900) Resp:  [18-19] 19 (09/11 0447) BP: (115-139)/(66-78) 139/69 (09/11 0447) SpO2:  [92 %-100 %] 96 % (09/11 0447) Weight:  [82.3 kg] 82.3 kg (09/11 0453)  Intake/Output from previous day: 09/10 0701 - 09/11 0700 In: 250 [P.O.:240] Out: 2950 [Urine:2550; Drains:400]  General appearance: alert, cooperative, and no distress Heart: regular rate and rhythm Lungs: clear to auscultation bilaterally Abdomen: soft, non-tender; bowel sounds normal; no masses,  no organomegaly Extremities: extremities normal, atraumatic, no cyanosis or edema Wound: clean and dry  Lab Results: Recent Labs    06/22/22 0404  WBC 8.8  HGB 8.5*  HCT 26.1*  PLT 639*   BMET:  Recent Labs    06/23/22 0530  CREATININE 1.02    PT/INR: No results for input(s): "LABPROT", "INR" in the last 72 hours. ABG    Component Value Date/Time   HCO3 31.6 (H) 06/04/2022 2032   O2SAT 30.9 06/04/2022 2032   CBG (last 3)  No results for input(s): "GLUCAP" in the last 72 hours.  Assessment/Plan:  Left Pleural Effusion- CT placed by IR on 9/4, has received Thrombolytics by Pulmonary Pulm- CT output 50 cc since yesterday, CXR with trace left pleural effusion, fluid cultures are negative, no indication for VATS.. can d/c chest tube from our standpoint, will sign off, further care per IR, pulmonary, medicine   LOS: 9 days    Lowella Dandy, PA-C 06/23/2022

## 2022-06-26 MED FILL — Fentanyl Citrate Preservative Free (PF) Inj 100 MCG/2ML: INTRAMUSCULAR | Qty: 0.5 | Status: AC

## 2022-06-29 LAB — OPIATES CONFIRMATION, URINE: OPIATES: NEGATIVE

## 2022-06-29 LAB — URINE DRUGS OF ABUSE SCREEN W ALC, ROUTINE (REF LAB)
Amphetamines, Urine: NEGATIVE ng/mL
Barbiturate, Ur: NEGATIVE ng/mL
Benzodiazepine Quant, Ur: NEGATIVE ng/mL
Cannabinoid Quant, Ur: NEGATIVE ng/mL
Cocaine (Metab.): NEGATIVE ng/mL
Ethanol U, Quan: NEGATIVE %
Methadone Screen, Urine: NEGATIVE ng/mL
Phencyclidine, Ur: NEGATIVE ng/mL
Propoxyphene, Urine: NEGATIVE ng/mL

## 2022-06-30 ENCOUNTER — Inpatient Hospital Stay: Payer: Medicare PPO | Admitting: Nurse Practitioner

## 2022-07-01 ENCOUNTER — Other Ambulatory Visit: Payer: Self-pay

## 2022-07-01 ENCOUNTER — Encounter: Payer: Self-pay | Admitting: Nurse Practitioner

## 2022-07-01 ENCOUNTER — Ambulatory Visit: Payer: Medicare PPO | Admitting: Nurse Practitioner

## 2022-07-01 ENCOUNTER — Ambulatory Visit (INDEPENDENT_AMBULATORY_CARE_PROVIDER_SITE_OTHER): Payer: Medicare PPO

## 2022-07-01 DIAGNOSIS — J189 Pneumonia, unspecified organism: Secondary | ICD-10-CM

## 2022-07-01 DIAGNOSIS — J449 Chronic obstructive pulmonary disease, unspecified: Secondary | ICD-10-CM

## 2022-07-01 DIAGNOSIS — J90 Pleural effusion, not elsewhere classified: Secondary | ICD-10-CM

## 2022-07-01 NOTE — Assessment & Plan Note (Addendum)
Improved. Continue mucociliary clearance therapies. See above plan.

## 2022-07-01 NOTE — Assessment & Plan Note (Signed)
Compensated on current regimen. He will continue Breo and Incruse for triple therapy. He has never had formal PFTs - plan to schedule these at follow up once he is fully recovered.

## 2022-07-01 NOTE — Progress Notes (Signed)
@Patient  ID: , male    DOB: [birth year], 66 y.o.   MRN: [id number]  Chief Complaint  Patient presents with   Hospitalization Follow-up    Crx before visit    Referring provider: No ref. provider found  HPI: 66 year old male, active smoker followed for parapneumonic effusion.  He is new to the pulmonary clinic and last seen during his hospitalization by Dr. 76.  Past medical history significant for COPD, RLS, HLD, hypertension.    He was recently hospitalized from 06/04/2022 to 06/07/2022 for CAP and associated left-sided pleural effusion.  He was treated with IV antibiotics.  IR performed thoracentesis and drained 650 mL, although effusion was not fully drained. Exudative by Light's Criteria. He was discharged home on 8/26. Unfortunately developed increasing SOB and returned to [facility] on 9/2. Found to have persistent left parapneumonic effusion. He was treated with pigtail CT and clinically improved; tube was removed 9/11 with small left effusion vs pleural thickening. He was discharged on 3 weeks on Augmentin on 06/23/2022.   TEST/EVENTS:   07/01/2022: 66 - hospital follow up Patient presents today for hospital follow up. He was discharged on 06/23/2022 on 3 weeks of Augmentin. He has been feeling better since he got home. Breathing is significantly improved. He still feels a little weaker than his normal but this is slowly improving as well. Occasional cough with clear sputum. No significant chest congestion or wheeze. He denies any fevers, chills, hemoptysis. Eating and drinking well. Using his Breo and Incruse daily. Has not had to use his rescue.   Allergies  Allergen Reactions   Motrin [Ibuprofen] Swelling    Anti-inflammatory analgesics- cause anaphylaxis    There is no immunization history for the selected administration types on file for this patient.  Past Medical History:  Diagnosis Date   Asthma    COPD (chronic obstructive pulmonary disease) (HCC)      Tobacco History: Social History   Tobacco Use  Smoking Status Some Days   Packs/day: 0.50   Types: Cigarettes  Smokeless Tobacco Never   Ready to quit: Not Answered Counseling given: Not Answered   Outpatient Medications Prior to Visit  Medication Sig Dispense Refill   albuterol (VENTOLIN HFA) 108 (90 Base) MCG/ACT inhaler Inhale 2 puffs into the lungs every 6 (six) hours as needed for shortness of breath.     amoxicillin-clavulanate (AUGMENTIN) 875-125 MG tablet Take 1 tablet by mouth every 12 (twelve) hours. 23 tablet 0   atorvastatin (LIPITOR) 40 MG tablet Take 40 mg by mouth daily.     benazepril (LOTENSIN) 40 MG tablet Take 40 mg by mouth daily.     cyclobenzaprine (FLEXERIL) 10 MG tablet Take 10 mg by mouth at bedtime.     dextromethorphan-guaiFENesin (MUCINEX DM) 30-600 MG 12hr tablet Take 1 tablet by mouth 2 (two) times daily. 30 tablet 1   diltiazem (CARDIZEM CD) 300 MG 24 hr capsule Take 300 mg by mouth daily.     fluticasone furoate-vilanterol (BREO ELLIPTA) 100-25 MCG/ACT AEPB Inhale 1 puff into the lungs daily. 28 each 0   HYDROcodone-acetaminophen (NORCO/VICODIN) 5-325 MG tablet Take 2 tablets by mouth every 6 (six) hours as needed for moderate pain or severe pain.     Ipratropium-Albuterol (COMBIVENT RESPIMAT) 20-100 MCG/ACT AERS respimat Inhale 1 puff into the lungs every 6 (six) hours as needed for wheezing. 4 g 1   nicotine (NICODERM CQ - DOSED IN MG/24 HOURS) 21 mg/24hr patch Place 1 patch (21 mg  total) onto the skin daily. 28 patch 0   rOPINIRole (REQUIP) 0.25 MG tablet Take 0.75 mg by mouth at bedtime.     traZODone (DESYREL) 50 MG tablet Take 50 mg by mouth at bedtime.     umeclidinium bromide (INCRUSE ELLIPTA) 62.5 MCG/ACT AEPB Inhale 1 puff into the lungs daily. 30 each 0   No facility-administered medications prior to visit.     Review of Systems:   Constitutional: No weight loss or gain, night sweats, fevers, chills. +fatigue, lassitude  (improving) HEENT: No headaches, difficulty swallowing, tooth/dental problems, or sore throat. No sneezing, itching, ear ache, nasal congestion, or post nasal drip CV:  No chest pain, orthopnea, PND, swelling in lower extremities, anasarca, dizziness, palpitations, syncope Resp: +occasional cough, clear sputum. No shortness of breath with exertion or at rest. No excess mucus or change in color of mucus.  No hemoptysis. No wheezing.  No chest wall deformity GI:  No heartburn, indigestion, abdominal pain, nausea, vomiting, diarrhea, change in bowel habits, loss of appetite, bloody stools.  GU: No dysuria, change in color of urine, urgency or frequency.  No flank pain, no hematuria  Neuro: No dizziness or lightheadedness.  Psych: No depression or anxiety. Mood stable.     Physical Exam:  BP 124/60 (BP Location: Left Arm, Cuff Size: Normal)   Pulse 81   Ht 5\' 10"  (1.778 m)   Wt 186 lb 9.6 oz (84.6 kg)   SpO2 97%   BMI 26.77 kg/m   GEN: Pleasant, interactive, well-appearing; in no acute distress. HEENT:  Normocephalic and atraumatic. PERRLA. Sclera white. Nasal turbinates pink, moist and patent bilaterally. No rhinorrhea present. Oropharynx pink and moist, without exudate or edema. No lesions, ulcerations, or postnasal drip.  NECK:  Supple w/ fair ROM. No JVD present. No lymphadenopathy.   CV: RRR, no m/r/g, no peripheral edema. Pulses intact, +2 bilaterally. No cyanosis, pallor or clubbing. PULMONARY:  Unlabored, regular breathing. Diminished bases L>R otherwise clear bilaterally A&P w/o wheezes/rales/rhonchi. No accessory muscle use. No dullness to percussion. GI: BS present and normoactive. Soft, non-tender to palpation. No organomegaly or masses detected.  MSK: No erythema, warmth or tenderness. No deformities or joint swelling noted.  Neuro: A/Ox3. No focal deficits noted.   Skin: Warm, no lesions or rashe Psych: Normal affect and behavior. Judgement and thought content appropriate.      Lab Results:  CBC    Component Value Date/Time   WBC 8.8 06/22/2022 0404   RBC 3.00 (L) 06/22/2022 0404   HGB 8.5 (L) 06/22/2022 0404   HCT 26.1 (L) 06/22/2022 0404   PLT 639 (H) 06/22/2022 0404   MCV 87.0 06/22/2022 0404   MCH 28.3 06/22/2022 0404   MCHC 32.6 06/22/2022 0404   RDW 17.4 (H) 06/22/2022 0404   LYMPHSABS 3.3 06/22/2022 0404   MONOABS 0.7 06/22/2022 0404   EOSABS 0.1 06/22/2022 0404   BASOSABS 0.1 06/22/2022 0404    BMET    Component Value Date/Time   NA 135 06/20/2022 0505   K 4.1 06/20/2022 0505   CL 103 06/20/2022 0505   CO2 24 06/20/2022 0505   GLUCOSE 82 06/20/2022 0505   BUN 11 06/20/2022 0505   CREATININE 1.02 06/23/2022 0530   CALCIUM 8.1 (L) 06/20/2022 0505   GFRNONAA >60 06/23/2022 0530    BNP No results found for: "BNP"   Imaging:  DG Chest 2 View  Result Date: 07/01/2022 CLINICAL DATA:  Pleural effusion EXAM: CHEST - 2 VIEW COMPARISON:  06/23/2022 FINDINGS: Small  left pleural effusion. Left basilar atelectasis or scarring, unchanged. Right lung clear. Heart is normal size. No pneumothorax. No acute bony abnormality. IMPRESSION: Small left pleural effusion with left base atelectasis or scarring. Electronically Signed   By: Charlett NoseKevin  Dover M.D.   On: 07/01/2022 10:05   DG Chest Port 1 View  Result Date: 06/23/2022 CLINICAL DATA:  Status post chest tube removal. EXAM: PORTABLE CHEST 1 VIEW COMPARISON:  Chest radiograph 06/23/2022 at 0548 hours FINDINGS: Left chest pigtail drain has been removed. Negative for a pneumothorax. Small linear patchy densities at left lung base are suggestive for areas of scarring and/or atelectasis. Right lung remains clear. Heart and mediastinum are within normal limits and stable. IMPRESSION: Negative for pneumothorax following removal of the left chest tube. Electronically Signed   By: Richarda OverlieAdam  Henn M.D.   On: 06/23/2022 13:22   DG Chest Port 1 View  Result Date: 06/23/2022 CLINICAL DATA:  Pleural effusion.  Status post left chest tube placement. EXAM: PORTABLE CHEST 1 VIEW COMPARISON:  06/22/2022 FINDINGS: Unchanged position of left basilar chest tube. No pneumothorax identified. Small residual left pleural effusion appears unchanged from previous exam. Left basilar atelectasis is also unchanged. Right lung appears clear. IMPRESSION: 1. Stable left chest tube placement. 2. No change in small left pleural effusion and left basilar atelectasis. Electronically Signed   By: Signa Kellaylor  Stroud M.D.   On: 06/23/2022 06:41   DG CHEST PORT 1 VIEW  Result Date: 06/22/2022 CLINICAL DATA:  Empyema. EXAM: PORTABLE CHEST 1 VIEW COMPARISON:  06/21/2022 FINDINGS: Percutaneous LEFT pigtail thoracostomy tube is unchanged. Small LEFT pleural effusion/pleural thickening again noted with mild LEFT basilar atelectasis. There is no evidence of pneumothorax. Cardiomediastinal silhouette is unchanged. The RIGHT lung is clear. No acute bony abnormalities are present. IMPRESSION: Unchanged appearance of the chest with LEFT thoracostomy tube, small LEFT pleural effusion/pleural thickening and LEFT basilar atelectasis. No evidence of pneumothorax. Electronically Signed   By: Harmon PierJeffrey  Hu M.D.   On: 06/22/2022 15:28   DG CHEST PORT 1 VIEW  Result Date: 06/21/2022 CLINICAL DATA:  Pleural effusion EXAM: PORTABLE CHEST 1 VIEW COMPARISON:  Previous studies including the examination of 06/19/2022 FINDINGS: There is interval decrease in left pleural effusion. Left chest tube is noted with its tip in the medial left lower lung field. There is no pneumothorax. Linear densities are seen in left lower lung field suggesting subsegmental atelectasis. Rest of the lung Rupe are clear. There is no pneumothorax. Degenerative changes are noted in right shoulder. IMPRESSION: There is interval decrease in left pleural effusion. There is no pneumothorax. Electronically Signed   By: Ernie AvenaPalani  Rathinasamy M.D.   On: 06/21/2022 10:30   CT CHEST WO CONTRAST  Result  Date: 06/20/2022 CLINICAL DATA:  Empyema EXAM: CT CHEST WITHOUT CONTRAST TECHNIQUE: Multidetector CT imaging of the chest was performed following the standard protocol without IV contrast. RADIATION DOSE REDUCTION: This exam was performed according to the departmental dose-optimization program which includes automated exposure control, adjustment of the mA and/or kV according to patient size and/or use of iterative reconstruction technique. COMPARISON:  06/14/2022 FINDINGS: Cardiovascular: Coronary, aortic arch, and branch vessel atherosclerotic vascular disease. Mediastinum/Nodes: Calcified left infrahilar nodes compatible with old granulomatous disease. Lungs/Pleura: Subsegmental atelectasis posteriorly in the right lower lobe with increased plugging of right lower lobe airways as shown on image 99 series 6. There is some frothy fluid in the bronchus intermedius dependently. Pigtail left pleural drainage catheter in place posteriorly, within a left hydropneumothorax encompassing about 30% of  left hemithoracic volume. There is substantial pleural thickening along the parietal and visceral pleural margins compatible with empyema, as well as peripheral subpleural atelectasis in the left lung. The pleural drainage tube does not appear kinked. The current hydropneumothorax is reduced in size compared to the previous large empyema shown on 06/14/2022. Upper Abdomen: Punctate calcifications in the spleen compatible with old granulomatous disease. Abdominal aortic atherosclerosis. Musculoskeletal: Severe right and moderate left degenerative glenohumeral arthropathy. IMPRESSION: 1. Left pleural drainage catheter in place with approximately 30% left hydropneumothorax, overall improved in volume compared to the empyema shown on 06/14/2022. There is pleural thickening compatible with inflammation/empyema, as well as peripheral subpleural atelectasis in the left lung. 2. Other imaging findings of potential clinical  significance: Aortic Atherosclerosis (ICD10-I70.0). Coronary atherosclerosis. Old granulomatous disease. Right greater than left degenerative glenohumeral arthropathy. Electronically Signed   By: Gaylyn Rong M.D.   On: 06/20/2022 09:35   DG CHEST PORT 1 VIEW  Result Date: 06/19/2022 CLINICAL DATA:  Left chest tube EXAM: PORTABLE CHEST 1 VIEW COMPARISON:  Chest x-ray dated June 18, 2022 FINDINGS: Cardiac and mediastinal contours are within normal limits. Small loculated left pleural effusion, increased in size when compared with prior exam. Left-sided chest tube in place. No evidence of pneumothorax. Left basilar lung opacities, likely due to atelectasis. IMPRESSION: Small loculated left pleural effusion, increased in size. Left-sided chest tube in place. Electronically Signed   By: Allegra Lai M.D.   On: 06/19/2022 08:26   DG Chest Port 1 View  Result Date: 06/18/2022 CLINICAL DATA:  Empyema lung.  Left chest tube. EXAM: PORTABLE CHEST 1 VIEW COMPARISON:  [facility] chest 06/17/2022 and 06/15/2022; CT chest 06/14/2022 FINDINGS: Left basilar pigtail drainage catheter is unchanged in position. Cardiac silhouette and mediastinal contours are within normal limits. Moderate thickening of the left mid to lower pleura and left basilar pleura is similar to 06/17/2022 frontal radiograph after chest tube placement and again decreased from 06/15/2022 radiograph. The right lung is clear. No pneumothorax. Mild dextrocurvature of the mid to lower thoracic spine. Mild-to-moderate multilevel degenerative disc changes. IMPRESSION: 1. Left basilar pigtail drainage catheter is unchanged in position. 2. Moderate left lateral and lower pleural thickening/loculated pleural fluid is unchanged from 06/17/2022. Electronically Signed   By: Neita Garnet M.D.   On: 06/18/2022 08:47   DG Chest Port 1 View  Result Date: 06/17/2022 CLINICAL DATA:  Left chest tube EXAM: PORTABLE CHEST 1 VIEW COMPARISON:  Radiograph 06/15/2022  FINDINGS: Unchanged cardiomediastinal silhouette. Slightly decreased left pleural effusion and adjacent basilar opacities with left basilar chest tube in place. No pneumothorax. Right lung is clear. Bones are unchanged including severe right glenohumeral osteoarthritis. IMPRESSION: Slightly decreased left pleural effusion with adjacent basilar opacities with basilar pigtail chest tube in place Electronically Signed   By: Caprice Renshaw M.D.   On: 06/17/2022 08:17   CT [facility] PLEURAL DRAIN W/INDWELL CATH W/IMG GUIDE  Result Date: 06/17/2022 INDICATION: Loculated LEFT pleural effusion EXAM: CT-GUIDED LEFT PIGTAIL THORACOSTOMY CATHETER PLACEMENT COMPARISON:  None Available. MEDICATIONS: The patient is currently admitted to the hospital and receiving intravenous antibiotics. The antibiotics were administered within an appropriate time frame prior to the initiation of the procedure. ANESTHESIA/SEDATION: Moderate (conscious) sedation was employed during this procedure. A total of Versed 2 mg and Fentanyl 100 mcg was administered intravenously. Moderate Sedation Time: 20 minutes. The patient's level of consciousness and vital signs were monitored continuously by radiology nursing throughout the procedure under my direct supervision. CONTRAST:  None COMPLICATIONS: None immediate.  PROCEDURE: RADIATION DOSE REDUCTION: This exam was performed according to the departmental dose-optimization program which includes automated exposure control, adjustment of the mA and/or kV according to patient size and/or use of iterative reconstruction technique. Informed written consent was obtained from the patient and/or patient's representative after a discussion of the risks, benefits and alternatives to treatment. The patient was placed wedged supine on the CT gantry and a pre procedural CT was performed re-demonstrating the known fluid collection within the LEFT chest. The procedure was planned. A timeout was performed prior to the  initiation of the procedure. The lateral LEFT chest was prepped and draped in the usual sterile fashion. The overlying soft tissues were anesthetized with 1% lidocaine with epinephrine. Appropriate trajectory was planned with the use of a 22 gauge spinal needle. An 18 gauge trocar needle was advanced into the fluid collection and a short Amplatz super stiff wire was coiled within the collection. Appropriate positioning was confirmed with a limited CT scan. The tract was serially dilated allowing placement of a 14 Fr drainage catheter. Appropriate positioning was confirmed with a limited postprocedural CT scan. 40 mL ml of serous pleural fluid was aspirated. The tube was connected to a pleura vac suction and sutured in place. A dressing was placed. The patient tolerated the procedure well without immediate post procedural complication. IMPRESSION: Successful CT guided placement of a 14 Fr LEFT pigtail thoracostomy drainage catheter for fibrinolytic therapy of a loculated LEFT pleural effusion. Samples were sent to the laboratory as requested by the ordering clinical team. Roanna Banning, MD [facility] Electronically Signed   By: Roanna Banning M.D.   On: 06/17/2022 08:06   DG Chest Port 1 View  Result Date: 06/15/2022 CLINICAL DATA:  Left pleural effusion.  Attempted thoracentesis. EXAM: PORTABLE CHEST 1 VIEW COMPARISON:  06/14/2022 FINDINGS: Stable loculated left pleural effusion. Left base collapse/consolidation similar to prior. No evidence for pneumothorax. Right lung clear. The cardiopericardial silhouette is within normal limits for size. Telemetry leads overlie the chest. IMPRESSION: No evidence for pneumothorax status post attempted left thoracentesis. Electronically Signed   By: Kennith Center M.D.   On: 06/15/2022 16:46   CT Chest Wo Contrast  Result Date: 06/14/2022 CLINICAL DATA:  Pneumonia. EXAM: CT CHEST WITHOUT CONTRAST TECHNIQUE:  Multidetector CT imaging of the chest was performed following the standard protocol without IV contrast. RADIATION DOSE REDUCTION: This exam was performed according to the departmental dose-optimization program which includes automated exposure control, adjustment of the mA and/or kV according to patient size and/or use of iterative reconstruction technique. COMPARISON:  June 04, 2022. FINDINGS: Cardiovascular: Atherosclerosis of thoracic aorta is noted without aneurysm formation. Normal cardiac size. No pericardial effusion. Mild coronary artery calcifications are noted. Mediastinum/Nodes: Thyroid gland and esophagus are unremarkable. Stable calcified adenopathy is noted most consistent with prior granulomatous disease. Lungs/Pleura: Right lung is clear. No definite pneumothorax is noted. Moderate size loculated left pleural effusion is again noted; there does appear to be the interval development of several foci of gas within this fluid collection which may represent either infection or attempted intervention. Mild associated atelectasis of the left upper and lower lobes is noted. Upper Abdomen: Calcified splenic granulomas are again noted. Musculoskeletal: No chest wall mass or suspicious bone lesions identified. IMPRESSION: Moderate size loculated left pleural effusion is again noted and not significantly changed in size. There does appear to be the interval development of several foci of gas within the fluid collection which may represent either  infection or attempted iatrogenic intervention Mild coronary artery calcifications are noted. Aortic Atherosclerosis (ICD10-I70.0). Electronically Signed   By: Lupita Raider M.D.   On: 06/14/2022 09:59   DG Chest Port 1 View  Result Date: 06/14/2022 CLINICAL DATA:  Hypotension with generalized weakness. EXAM: PORTABLE CHEST 1 VIEW COMPARISON:  06/05/2022 FINDINGS: Right lung clear. Loculated left pleural effusion is progressive in the interval, now moderate to  large in size with associated left base collapse/consolidation. Cardiopericardial silhouette is at upper limits of normal for size. The visualized bony structures of the thorax are unremarkable. Telemetry leads overlie the chest. IMPRESSION: Interval progression of loculated left pleural effusion, now moderate to large in size. Left base collapse/consolidation. Electronically Signed   By: Kennith Center M.D.   On: 06/14/2022 09:04   DG Chest 1 View  Result Date: 06/05/2022 CLINICAL DATA:  Partially loculated LEFT pleural effusion post thoracentesis EXAM: CHEST  1 VIEW COMPARISON:  12/10/2021 FINDINGS: Normal heart size, mediastinal contours, and pulmonary vascularity. Atherosclerotic calcification aorta. Small to moderate LEFT pleural effusion and basilar atelectasis. No pneumothorax following thoracentesis. RIGHT lung clear. Advanced RIGHT glenohumeral degenerative changes. IMPRESSION: No pneumothorax following LEFT thoracentesis. Small to moderate LEFT pleural effusion and basilar atelectasis. Aortic Atherosclerosis (ICD10-I70.0). Electronically Signed   By: Ulyses Southward M.D.   On: 06/05/2022 09:59   US THORACENTESIS ASP PLEURAL SPACE W/IMG GUIDE  Result Date: 06/05/2022 INDICATION: Left pleural effusion EXAM: ULTRASOUND GUIDED LEFT THORACENTESIS MEDICATIONS: 9 cc 1% lidocaine. COMPLICATIONS: None immediate. PROCEDURE: An ultrasound guided thoracentesis was thoroughly discussed with the patient and questions answered. The benefits, risks, alternatives and complications were also discussed. The patient understands and wishes to proceed with the procedure. Written consent was obtained. Ultrasound was performed to localize and mark an adequate pocket of fluid in the Left chest. The area was then prepped and draped in the normal sterile fashion. 1% Lidocaine was used for local anesthesia. Under ultrasound guidance a Yueh catheter was introduced. Thoracentesis was performed. The catheter was removed and a  dressing applied. FINDINGS: A total of approximately 650 cc of yellow fluid was removed. Samples were sent to the laboratory as requested by the clinical team. IMPRESSION: Successful ultrasound guided left thoracentesis yielding 650 cc of pleural fluid. Post CXR: No PTX per Dr Tyron Russell Read by Robet Leu [facility] Electronically Signed   By: Ulyses Southward M.D.   On: 06/05/2022 09:52   CT Chest Wo Contrast  Result Date: 06/04/2022 CLINICAL DATA:  Pneumonia, complication suspected, xray done EXAM: CT CHEST WITHOUT CONTRAST TECHNIQUE: Multidetector CT imaging of the chest was performed following the standard protocol without IV contrast. RADIATION DOSE REDUCTION: This exam was performed according to the departmental dose-optimization program which includes automated exposure control, adjustment of the mA and/or kV according to patient size and/or use of iterative reconstruction technique. COMPARISON:  12/10/2021. FINDINGS: Cardiovascular: Moderate coronary artery and aortic calcifications. Heart is normal size. Aorta is normal caliber. Mediastinum/Nodes: No mediastinal, hilar, or axillary adenopathy. Trachea and esophagus are unremarkable. Thyroid unremarkable. Lungs/Pleura: Moderate loculated left pleural effusion. Airspace disease in the left lower lobe and to a lesser extent lingula which could reflect atelectasis or pneumonia. Right lung clear. Upper Abdomen: Calcifications in the liver and spleen compatible with old granulomatous disease. Musculoskeletal: Chest wall soft tissues are unremarkable. No acute bony abnormality. IMPRESSION: Moderate loculated left pleural effusion. Airspace disease in the left lower lobe concerning for pneumonia. Lingular opacity likely reflects atelectasis. Old granulomatous disease. Coronary artery disease. Aortic Atherosclerosis (  ICD10-I70.0). Electronically Signed   By: Charlett Nose M.D.   On: 06/04/2022 21:54          No data to display          No results found for:  "NITRICOXIDE"      Assessment & Plan:   Pleural effusion Left parapneumonic effusion secondary to CAP. Treated with pigtail CT which was removed 9/11. Discharged home same day. Clinically improving. CXR today is stable; possible pleural thickening on the left. He has two more weeks of augmentin to complete 3 week course. Strict return/ED precautions provided.   Patient Instructions  Continue Albuterol inhaler 2 puffs every 6 hours as needed for shortness of breath or wheezing. Notify if symptoms persist despite rescue inhaler/neb use.  Continue Breo 1 puff daily. Brush tongue and rinse mouth afterwards Continue Incruse 1 puff daily  Continue Mucinex [phone] mg Twice daily for chest congestion Complete augmentin twice daily until 10/2 to complete three week course. Call me if you run out or do not have enough to complete these  Take a daily probiotic while you're taking the augmentin   Follow up in 4-6 weeks with new pulmonologist. If symptoms do not improve or worsen, please contact office for sooner follow up or seek emergency care.    COPD (chronic obstructive pulmonary disease) (HCC) Compensated on current regimen. He will continue Breo and Incruse for triple therapy. He has never had formal PFTs - plan to schedule these at follow up once he is fully recovered.   CAP (community acquired pneumonia) Improved. Continue mucociliary clearance therapies. See above plan.   I spent 35 minutes of dedicated to the care of this patient on the date of this encounter to include pre-visit review of records, face-to-face time with the patient discussing conditions above, post visit ordering of testing, clinical documentation with the electronic health record, making appropriate referrals as documented, and communicating necessary findings to members of the patients care team.  Noemi Chapel, NP 07/01/2022  Pt aware and understands NP's role.

## 2022-07-01 NOTE — Assessment & Plan Note (Signed)
Left parapneumonic effusion secondary to CAP. Treated with pigtail CT which was removed 9/11. Discharged home same day. Clinically improving. CXR today is stable; possible pleural thickening on the left. He has two more weeks of augmentin to complete 3 week course. Strict return/ED precautions provided.   Patient Instructions  Continue Albuterol inhaler 2 puffs every 6 hours as needed for shortness of breath or wheezing. Notify if symptoms persist despite rescue inhaler/neb use.  Continue Breo 1 puff daily. Brush tongue and rinse mouth afterwards Continue Incruse 1 puff daily  Continue Mucinex [phone] mg Twice daily for chest congestion Complete augmentin twice daily until 10/2 to complete three week course. Call me if you run out or do not have enough to complete these  Take a daily probiotic while you're taking the augmentin   Follow up in 4-6 weeks with new pulmonologist. If symptoms do not improve or worsen, please contact office for sooner follow up or seek emergency care.

## 2022-07-01 NOTE — Patient Instructions (Addendum)
Continue Albuterol inhaler 2 puffs every 6 hours as needed for shortness of breath or wheezing. Notify if symptoms persist despite rescue inhaler/neb use.  Continue Breo 1 puff daily. Brush tongue and rinse mouth afterwards Continue Incruse 1 puff daily  Continue Mucinex [phone] mg Twice daily for chest congestion Complete augmentin twice daily until 10/2 to complete three week course. Call me if you run out or do not have enough to complete these  Take a daily probiotic while you're taking the augmentin   Follow up in 4-6 weeks with new pulmonologist. If symptoms do not improve or worsen, please contact office for sooner follow up or seek emergency care.

## 2022-08-12 ENCOUNTER — Ambulatory Visit: Payer: Medicare PPO | Admitting: Nurse Practitioner

## 2022-09-04 IMAGING — DX DG CHEST 1V PORT
1 series · 1 of 1 positions shown · non-contrast
Comparison: 03/01/2007

CLINICAL DATA: Cough following COVID booster

EXAM:
PORTABLE CHEST 1 VIEW

[chest ap]
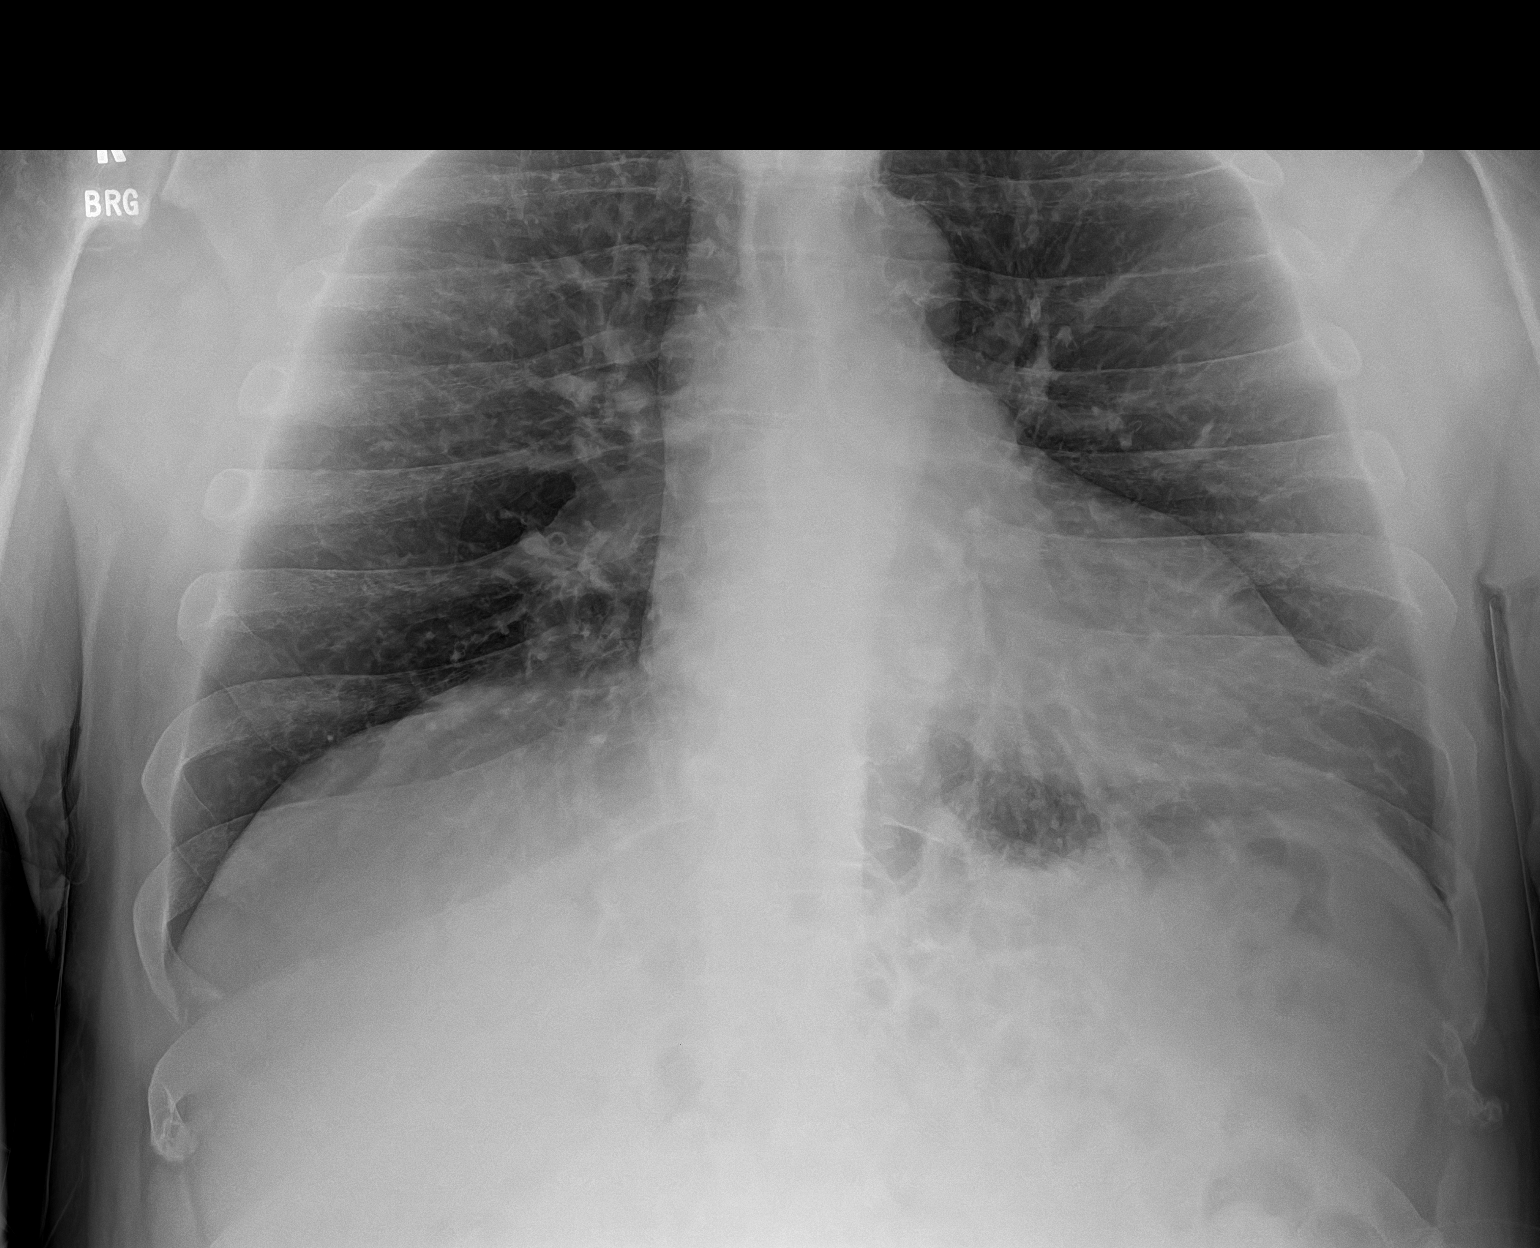

[1 of 1 positions shown; findings below may reference images not displayed]

FINDINGS: Cardiac shadow is within normal limits. Changes of prior
granulomatous disease are seen. Lungs are well aerated without focal
infiltrate or sizable effusion. No bony abnormality is seen.
IMPRESSION: No active disease.

## 2022-09-04 IMAGING — CT CT ANGIO CHEST
2 of 6 series · 19 of 46 positions shown · IV contrast (Omnipaque or Isovue)
Comparison: None.

CLINICAL DATA: Cough, COPD

EXAM:
CT ANGIOGRAPHY CHEST WITH CONTRAST
TECHNIQUE: Multidetector CT imaging of the chest was performed using the
standard protocol during bolus administration of intravenous
contrast. Multiplanar CT image reconstructions and MIPs were
obtained to evaluate the vascular anatomy.
CONTRAST:  100mL OMNIPAQUE IOHEXOL 350 MG/ML SOLN

[Series 5: pe axial thins · axial · 0.78mm/px · z∈[+981,+1275]mm · 16 of 324 slices shown]
[im 15/324  lung]
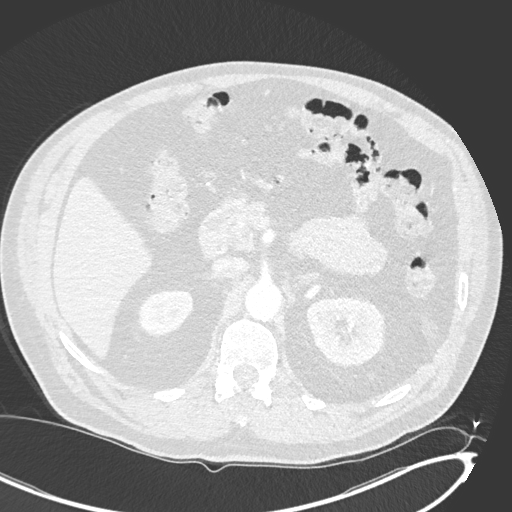
[im 43/324  soft-tissue]
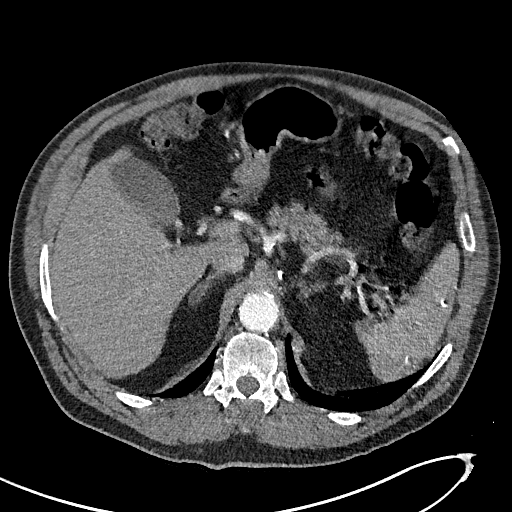
[im 57/324  lung]
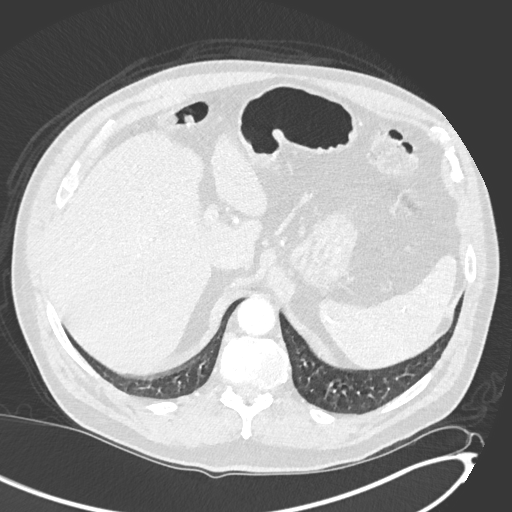
[im 71/324  soft-tissue]
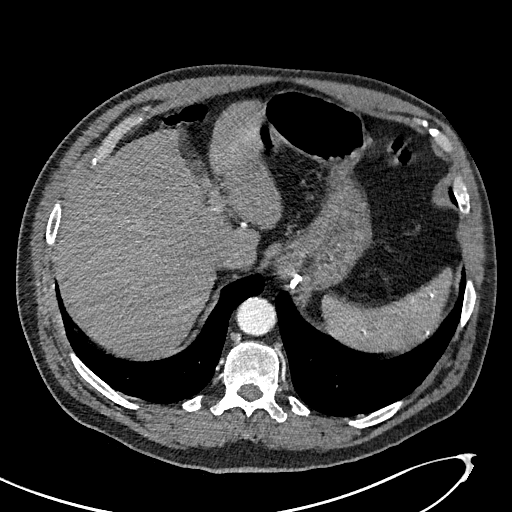
[im 99/324  lung]
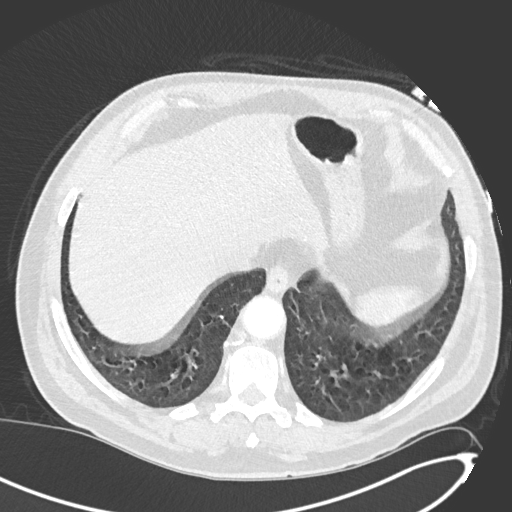
[im 113/324  soft-tissue]
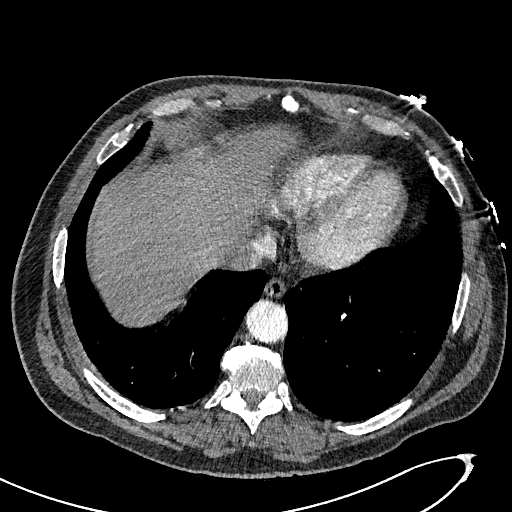
[im 127/324  lung]
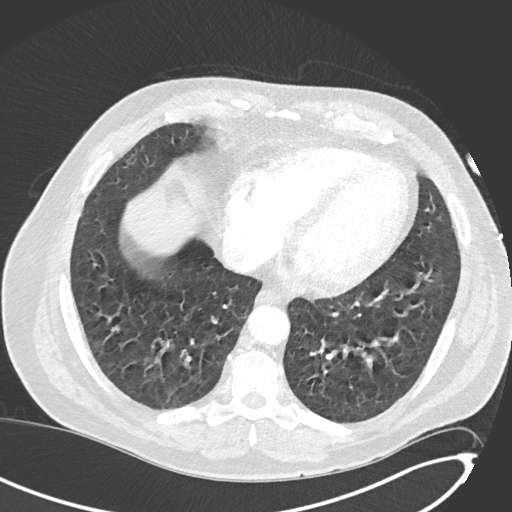
[im 155/324  soft-tissue]
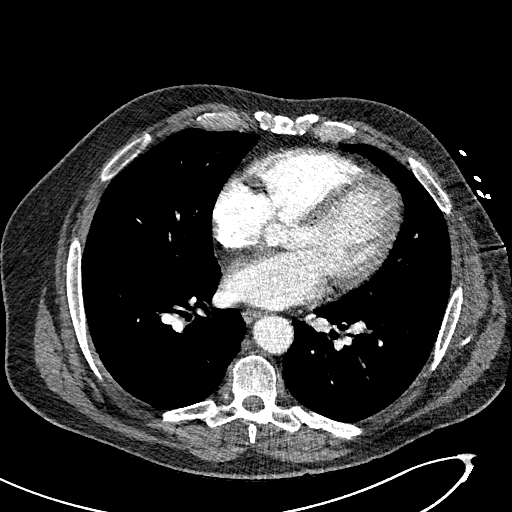
[im 169/324  lung]
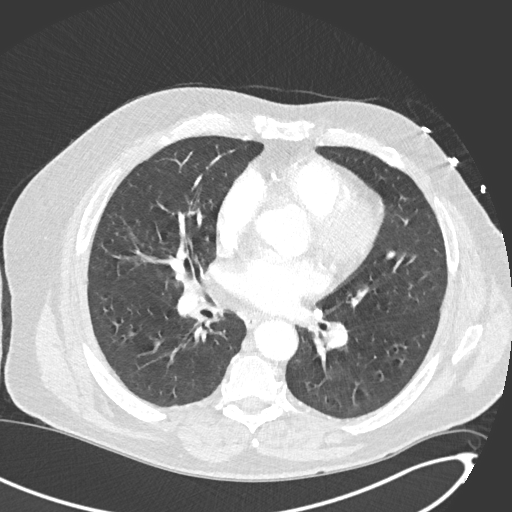
[im 197/324  soft-tissue]
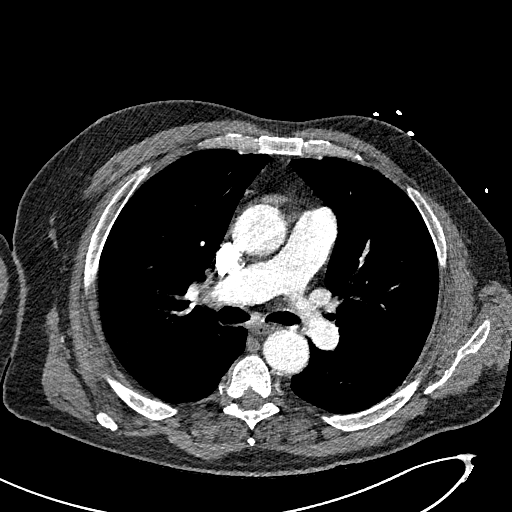
[im 211/324  lung]
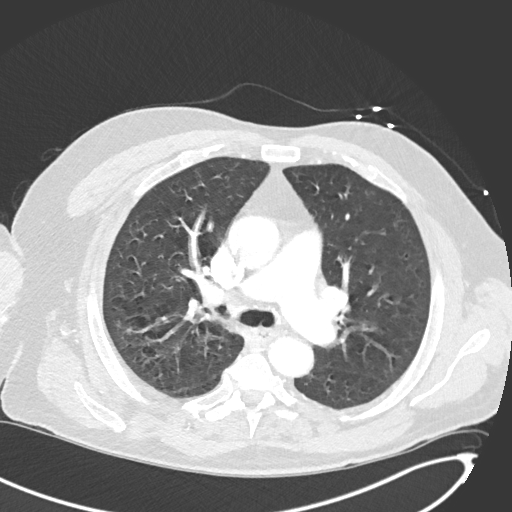
[im 225/324  soft-tissue]
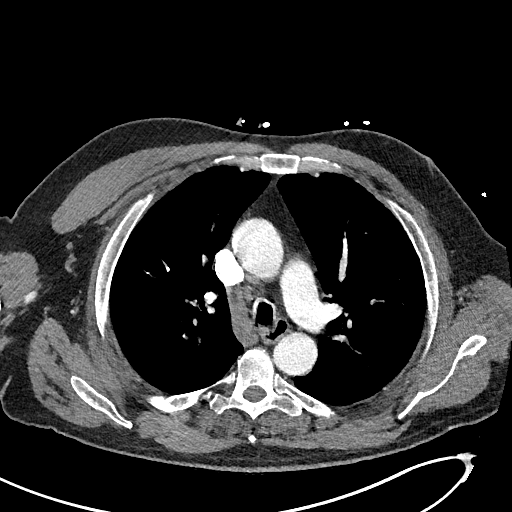
[im 253/324  lung]
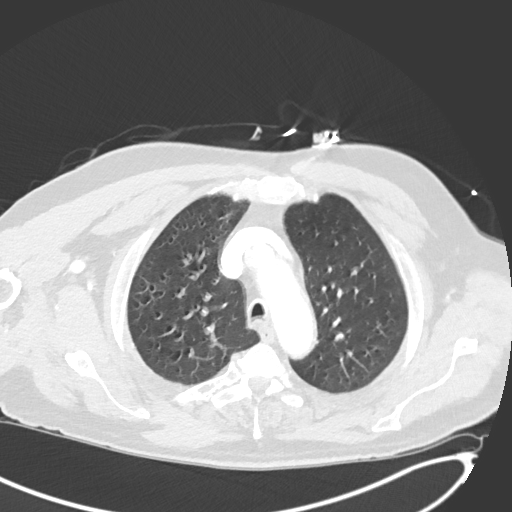
[im 267/324  soft-tissue]
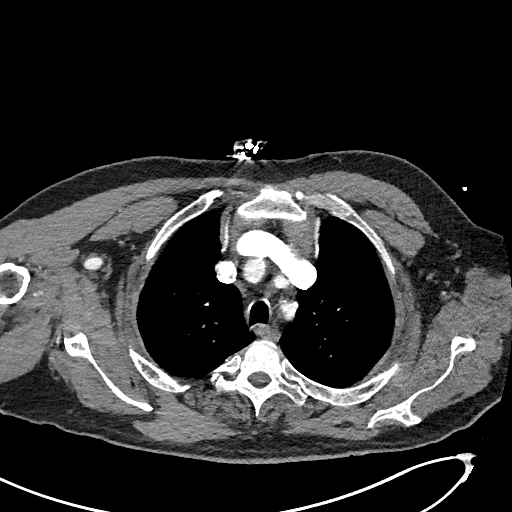
[im 281/324  lung]
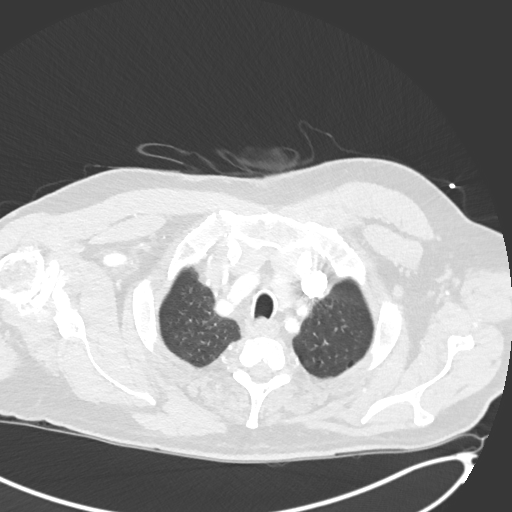
[im 309/324  soft-tissue]
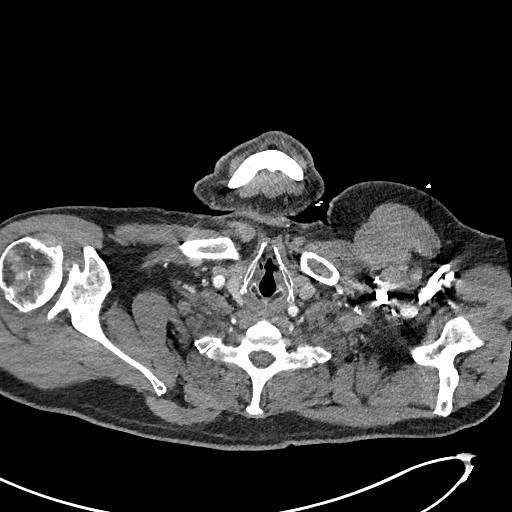

[Series 7: cor soft · coronal · 0.70mm/px · 3 of 170 slices shown]
[im 43/170  soft-tissue]
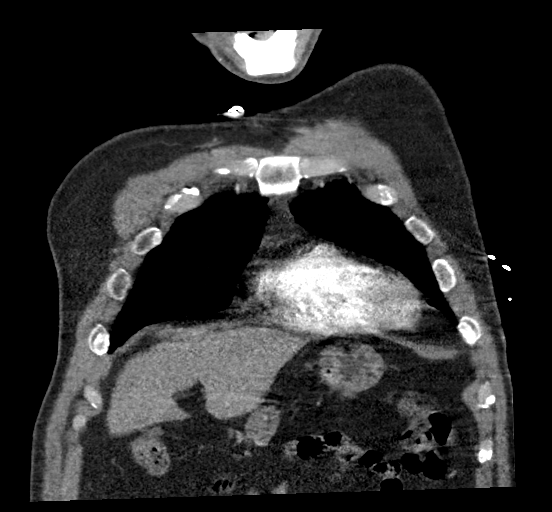
[im 85/170  soft-tissue]
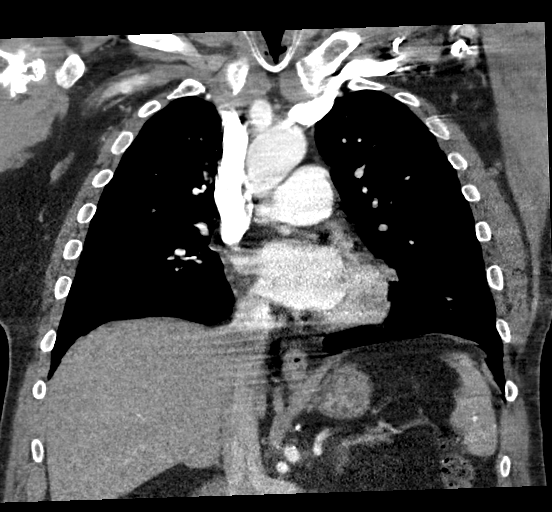
[im 127/170  soft-tissue]
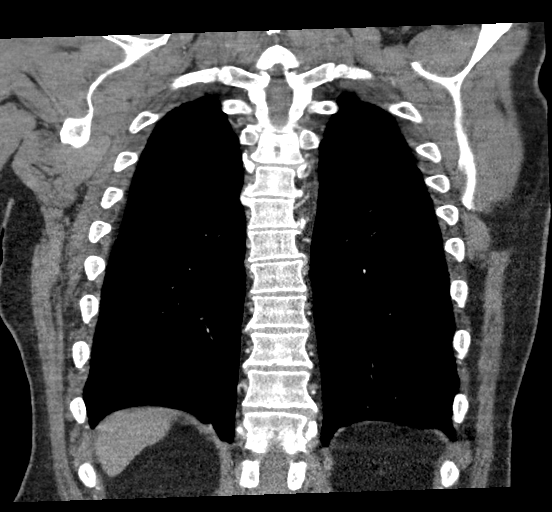

[19 of 46 positions shown; findings below may reference images not displayed]

FINDINGS: Cardiovascular: Examination for pulmonary embolism is somewhat
limited by breath motion artifact throughout. Within this
limitation, no evidence of pulmonary embolism to the proximal
segmental pulmonary arterial level. Normal heart size. Three-vessel
coronary artery calcifications. No pericardial effusion. Aortic
atherosclerosis.

Mediastinum/Nodes: No enlarged mediastinal, hilar, or axillary lymph
nodes. Thyroid gland, trachea, and esophagus demonstrate no
significant findings.

Lungs/Pleura: Examination of the lungs is generally limited by
breath motion artifact. Diffuse bilateral bronchial wall thickening.
No pleural effusion or pneumothorax.

Upper Abdomen: No acute abnormality.

Musculoskeletal: No chest wall abnormality. No acute or significant
osseous findings.

Review of the MIP images confirms the above findings.
IMPRESSION: 1. Examination for pulmonary embolism is somewhat limited by breath
motion artifact throughout. Within this limitation, no evidence of
pulmonary embolism to the proximal segmental pulmonary arterial
level.
2. Diffuse bilateral bronchial wall thickening, consistent with
nonspecific infectious or inflammatory bronchitis.
3. Coronary artery disease.

Aortic Atherosclerosis (548ND-84M.M).

## 2022-10-16 ENCOUNTER — Ambulatory Visit: Payer: Medicare PPO | Admitting: Physician Assistant

## 2022-10-16 ENCOUNTER — Ambulatory Visit (HOSPITAL_COMMUNITY): Payer: Medicare PPO | Admitting: Physician Assistant

## 2022-10-16 ENCOUNTER — Inpatient Hospital Stay: Payer: Medicare PPO

## 2022-10-16 ENCOUNTER — Inpatient Hospital Stay: Payer: Medicare PPO | Attending: Physician Assistant | Admitting: Physician Assistant

## 2022-10-16 ENCOUNTER — Other Ambulatory Visit: Payer: Medicare PPO

## 2022-10-16 ENCOUNTER — Other Ambulatory Visit (HOSPITAL_COMMUNITY): Payer: Medicare PPO

## 2023-05-19 ENCOUNTER — Other Ambulatory Visit: Payer: Self-pay | Admitting: Nurse Practitioner

## 2023-05-19 DIAGNOSIS — R1032 Left lower quadrant pain: Secondary | ICD-10-CM

## 2023-09-22 ENCOUNTER — Other Ambulatory Visit: Payer: Self-pay | Admitting: Nurse Practitioner

## 2023-09-22 DIAGNOSIS — R1032 Left lower quadrant pain: Secondary | ICD-10-CM

## 2023-10-15 ENCOUNTER — Ambulatory Visit: Payer: Medicare PPO

## 2023-10-20 ENCOUNTER — Ambulatory Visit
Admission: RE | Admit: 2023-10-20 | Discharge: 2023-10-20 | Disposition: A | Discharge status: Home / Self Care | Payer: Medicare PPO | Source: Ambulatory Visit | Attending: Nurse Practitioner | Admitting: Nurse Practitioner

## 2023-10-20 DIAGNOSIS — R1032 Left lower quadrant pain: Secondary | ICD-10-CM | POA: Diagnosis present

## 2023-11-24 ENCOUNTER — Other Ambulatory Visit: Payer: Self-pay | Admitting: Nurse Practitioner

## 2023-11-24 DIAGNOSIS — Z122 Encounter for screening for malignant neoplasm of respiratory organs: Secondary | ICD-10-CM

## 2024-03-09 ENCOUNTER — Other Ambulatory Visit: Payer: Self-pay

## 2024-03-09 ENCOUNTER — Inpatient Hospital Stay (HOSPITAL_COMMUNITY)
Admission: EM | Admit: 2024-03-09 | Discharge: 2024-03-12 | DRG: 377 | Disposition: A | Discharge status: Home / Self Care | Attending: Internal Medicine | Admitting: Internal Medicine

## 2024-03-09 ENCOUNTER — Encounter (HOSPITAL_COMMUNITY): Payer: Self-pay | Admitting: Emergency Medicine

## 2024-03-09 ENCOUNTER — Emergency Department (HOSPITAL_COMMUNITY)

## 2024-03-09 DIAGNOSIS — G2581 Restless legs syndrome: Secondary | ICD-10-CM | POA: Diagnosis present

## 2024-03-09 DIAGNOSIS — D62 Acute posthemorrhagic anemia: Secondary | ICD-10-CM | POA: Diagnosis not present

## 2024-03-09 DIAGNOSIS — J4489 Other specified chronic obstructive pulmonary disease: Secondary | ICD-10-CM | POA: Diagnosis present

## 2024-03-09 DIAGNOSIS — D75839 Thrombocytosis, unspecified: Secondary | ICD-10-CM | POA: Diagnosis not present

## 2024-03-09 DIAGNOSIS — K921 Melena: Secondary | ICD-10-CM | POA: Insufficient documentation

## 2024-03-09 DIAGNOSIS — D649 Anemia, unspecified: Principal | ICD-10-CM

## 2024-03-09 DIAGNOSIS — K2970 Gastritis, unspecified, without bleeding: Secondary | ICD-10-CM | POA: Diagnosis present

## 2024-03-09 DIAGNOSIS — K264 Chronic or unspecified duodenal ulcer with hemorrhage: Secondary | ICD-10-CM | POA: Diagnosis not present

## 2024-03-09 DIAGNOSIS — K920 Hematemesis: Secondary | ICD-10-CM

## 2024-03-09 DIAGNOSIS — Z7951 Long term (current) use of inhaled steroids: Secondary | ICD-10-CM

## 2024-03-09 DIAGNOSIS — E860 Dehydration: Secondary | ICD-10-CM | POA: Diagnosis present

## 2024-03-09 DIAGNOSIS — F17210 Nicotine dependence, cigarettes, uncomplicated: Secondary | ICD-10-CM | POA: Diagnosis present

## 2024-03-09 DIAGNOSIS — E782 Mixed hyperlipidemia: Secondary | ICD-10-CM | POA: Diagnosis not present

## 2024-03-09 DIAGNOSIS — F17290 Nicotine dependence, other tobacco product, uncomplicated: Secondary | ICD-10-CM | POA: Diagnosis present

## 2024-03-09 DIAGNOSIS — K2091 Esophagitis, unspecified with bleeding: Secondary | ICD-10-CM | POA: Diagnosis present

## 2024-03-09 DIAGNOSIS — K274 Chronic or unspecified peptic ulcer, site unspecified, with hemorrhage: Secondary | ICD-10-CM

## 2024-03-09 DIAGNOSIS — K269 Duodenal ulcer, unspecified as acute or chronic, without hemorrhage or perforation: Secondary | ICD-10-CM

## 2024-03-09 DIAGNOSIS — I10 Essential (primary) hypertension: Secondary | ICD-10-CM | POA: Diagnosis present

## 2024-03-09 DIAGNOSIS — Z886 Allergy status to analgesic agent status: Secondary | ICD-10-CM

## 2024-03-09 DIAGNOSIS — Z791 Long term (current) use of non-steroidal anti-inflammatories (NSAID): Secondary | ICD-10-CM

## 2024-03-09 DIAGNOSIS — T39395A Adverse effect of other nonsteroidal anti-inflammatory drugs [NSAID], initial encounter: Secondary | ICD-10-CM | POA: Diagnosis present

## 2024-03-09 DIAGNOSIS — Z79899 Other long term (current) drug therapy: Secondary | ICD-10-CM

## 2024-03-09 HISTORY — DX: Essential (primary) hypertension: I10

## 2024-03-09 HISTORY — DX: Cardiac murmur, unspecified: R01.1

## 2024-03-09 HISTORY — DX: Sleep apnea, unspecified: G47.30

## 2024-03-09 LAB — COMPREHENSIVE METABOLIC PANEL WITH GFR
ALT: 7 U/L (ref 0–44)
AST: 14 U/L — ABNORMAL LOW (ref 15–41)
Albumin: 3.1 g/dL — ABNORMAL LOW (ref 3.5–5.0)
Alkaline Phosphatase: 111 U/L (ref 38–126)
Anion gap: 10 (ref 5–15)
BUN: 29 mg/dL — ABNORMAL HIGH (ref 8–23)
CO2: 23 mmol/L (ref 22–32)
Calcium: 9.1 mg/dL (ref 8.9–10.3)
Chloride: 103 mmol/L (ref 98–111)
Creatinine, Ser: 1.26 mg/dL — ABNORMAL HIGH (ref 0.61–1.24)
GFR, Estimated: >60 mL/min (ref >60)
Glucose, Bld: 112 mg/dL — ABNORMAL HIGH (ref 70–99)
Potassium: 3.6 mmol/L (ref 3.5–5.1)
Sodium: 136 mmol/L (ref 135–145)
Total Bilirubin: 0.3 mg/dL (ref 0.0–1.2)
Total Protein: 6.4 g/dL — ABNORMAL LOW (ref 6.5–8.1)

## 2024-03-09 LAB — HEMOGLOBIN AND HEMATOCRIT, BLOOD
HCT: 21.3 % — ABNORMAL LOW (ref 39.0–52.0)
Hemoglobin: 6.9 g/dL — CL (ref 13.0–17.0)

## 2024-03-09 LAB — CBC WITH DIFFERENTIAL/PLATELET
Abs Immature Granulocytes: 0.04 10*3/uL (ref 0.00–0.07)
Basophils Absolute: 0 10*3/uL (ref 0.0–0.1)
Basophils Relative: 0 %
Eosinophils Absolute: 0 10*3/uL (ref 0.0–0.5)
Eosinophils Relative: 0 %
HCT: 21.2 % — ABNORMAL LOW (ref 39.0–52.0)
Hemoglobin: 6.7 g/dL — CL (ref 13.0–17.0)
Immature Granulocytes: 0 %
Lymphocytes Relative: 11 %
Lymphs Abs: 1.2 10*3/uL (ref 0.7–4.0)
MCH: 29.6 pg (ref 26.0–34.0)
MCHC: 31.6 g/dL (ref 30.0–36.0)
MCV: 93.8 fL (ref 80.0–100.0)
Monocytes Absolute: 0.8 10*3/uL (ref 0.1–1.0)
Monocytes Relative: 7 %
Neutro Abs: 8.6 10*3/uL — ABNORMAL HIGH (ref 1.7–7.7)
Neutrophils Relative %: 82 %
Platelets: 480 10*3/uL — ABNORMAL HIGH (ref 150–400)
RBC: 2.26 MIL/uL — ABNORMAL LOW (ref 4.22–5.81)
RDW: 15.9 % — ABNORMAL HIGH (ref 11.5–15.5)
WBC: 10.6 10*3/uL — ABNORMAL HIGH (ref 4.0–10.5)
nRBC: 0 % (ref 0.0–0.2)

## 2024-03-09 LAB — URINALYSIS, W/ REFLEX TO CULTURE (INFECTION SUSPECTED)
Bilirubin Urine: NEGATIVE
Glucose, UA: NEGATIVE mg/dL
Hgb urine dipstick: NEGATIVE
Ketones, ur: NEGATIVE mg/dL
Leukocytes,Ua: NEGATIVE
Nitrite: NEGATIVE
Protein, ur: 30 mg/dL — AB
Specific Gravity, Urine: 1.021 (ref 1.005–1.030)
pH: 5 (ref 5.0–8.0)

## 2024-03-09 LAB — PREPARE RBC (CROSSMATCH)

## 2024-03-09 LAB — LIPASE, BLOOD: Lipase: 28 U/L (ref 11–51)

## 2024-03-09 LAB — ABO/RH: ABO/RH(D): O POS

## 2024-03-09 LAB — TROPONIN I (HIGH SENSITIVITY)
Troponin I (High Sensitivity): 8 ng/L (ref <18)
Troponin I (High Sensitivity): 7 ng/L (ref <18)

## 2024-03-09 LAB — POC OCCULT BLOOD, ED

## 2024-03-09 LAB — HIV ANTIBODY (ROUTINE TESTING W REFLEX): HIV Screen 4th Generation wRfx: NONREACTIVE

## 2024-03-09 MED ORDER — IPRATROPIUM-ALBUTEROL 0.5-2.5 (3) MG/3ML IN SOLN
3.0000 mL | Freq: Three times a day (TID) | RESPIRATORY_TRACT | Status: DC
  Administered 2024-03-09 (×2): 3 mL via RESPIRATORY_TRACT
  Filled 2024-03-09 (×2): qty 3

## 2024-03-09 MED ORDER — SODIUM CHLORIDE 0.9% IV SOLUTION: Freq: Once | INTRAVENOUS | Status: AC

## 2024-03-09 MED ORDER — SODIUM CHLORIDE 0.9 % IV BOLUS
1000.0000 mL | Freq: Once | INTRAVENOUS | Status: AC
Start: 2024-03-09 — End: 2024-03-09
  Administered 2024-03-09: 1000 mL via INTRAVENOUS

## 2024-03-09 MED ORDER — ATORVASTATIN CALCIUM 40 MG PO TABS
40.0000 mg | ORAL_TABLET | Freq: Every day | ORAL | Status: DC
  Administered 2024-03-09 – 2024-03-12 (×4): 40 mg via ORAL
  Filled 2024-03-09 (×4): qty 1

## 2024-03-09 MED ORDER — NICOTINE 21 MG/24HR TD PT24
21.0000 mg | MEDICATED_PATCH | Freq: Every day | TRANSDERMAL | Status: DC
  Administered 2024-03-09 – 2024-03-11 (×3): 21 mg via TRANSDERMAL
  Filled 2024-03-09 (×4): qty 1

## 2024-03-09 MED ORDER — ROPINIROLE HCL 1 MG PO TABS
1.0000 mg | ORAL_TABLET | Freq: Every day | ORAL | Status: DC
  Administered 2024-03-09: 1 mg via ORAL
  Filled 2024-03-09 (×2): qty 1

## 2024-03-09 MED ORDER — ONDANSETRON HCL 4 MG PO TABS: 4.0000 mg | ORAL_TABLET | Freq: Four times a day (QID) | ORAL | Status: DC | PRN

## 2024-03-09 MED ORDER — TRAZODONE HCL 50 MG PO TABS
150.0000 mg | ORAL_TABLET | Freq: Every evening | ORAL | Status: DC | PRN
  Administered 2024-03-10 – 2024-03-11 (×3): 150 mg via ORAL
  Filled 2024-03-09 (×3): qty 3

## 2024-03-09 MED ORDER — ONDANSETRON HCL 4 MG/2ML IJ SOLN: 4.0000 mg | Freq: Four times a day (QID) | INTRAMUSCULAR | Status: DC | PRN

## 2024-03-09 MED ORDER — PANTOPRAZOLE SODIUM 40 MG IV SOLR
40.0000 mg | Freq: Two times a day (BID) | INTRAVENOUS | Status: DC
  Administered 2024-03-09 – 2024-03-12 (×6): 40 mg via INTRAVENOUS
  Filled 2024-03-09 (×6): qty 10

## 2024-03-09 MED ORDER — IPRATROPIUM-ALBUTEROL 0.5-2.5 (3) MG/3ML IN SOLN
3.0000 mL | Freq: Two times a day (BID) | RESPIRATORY_TRACT | Status: DC
  Administered 2024-03-10 – 2024-03-12 (×5): 3 mL via RESPIRATORY_TRACT
  Filled 2024-03-09 (×5): qty 3

## 2024-03-09 MED ORDER — DILTIAZEM HCL ER COATED BEADS 180 MG PO CP24
360.0000 mg | ORAL_CAPSULE | Freq: Every day | ORAL | Status: DC
  Administered 2024-03-09: 360 mg via ORAL
  Filled 2024-03-09 (×2): qty 2

## 2024-03-09 MED ORDER — HYDROCODONE-ACETAMINOPHEN 10-325 MG PO TABS
1.0000 | ORAL_TABLET | ORAL | Status: DC | PRN
  Administered 2024-03-09 – 2024-03-12 (×6): 1 via ORAL
  Filled 2024-03-09 (×7): qty 1

## 2024-03-09 MED ORDER — BOOST / RESOURCE BREEZE PO LIQD CUSTOM
1.0000 | Freq: Three times a day (TID) | ORAL | Status: DC
  Administered 2024-03-09 – 2024-03-11 (×2): 1 via ORAL

## 2024-03-09 MED ORDER — PANTOPRAZOLE SODIUM 40 MG IV SOLR
80.0000 mg | Freq: Once | INTRAVENOUS | Status: AC
  Administered 2024-03-09: 80 mg via INTRAVENOUS
  Filled 2024-03-09: qty 20

## 2024-03-09 MED ORDER — ACETAMINOPHEN 325 MG PO TABS: 650.0000 mg | ORAL_TABLET | Freq: Four times a day (QID) | ORAL | Status: DC | PRN

## 2024-03-09 MED ORDER — ALUM & MAG HYDROXIDE-SIMETH 200-200-20 MG/5ML PO SUSP
30.0000 mL | Freq: Once | ORAL | Status: AC
  Administered 2024-03-09: 30 mL via ORAL
  Filled 2024-03-09: qty 30

## 2024-03-09 MED ORDER — PANTOPRAZOLE SODIUM 40 MG IV SOLR
40.0000 mg | Freq: Two times a day (BID) | INTRAVENOUS | Status: DC
  Filled 2024-03-09: qty 10

## 2024-03-09 MED ORDER — ACETAMINOPHEN 650 MG RE SUPP: 650.0000 mg | Freq: Four times a day (QID) | RECTAL | Status: DC | PRN

## 2024-03-09 NOTE — Consult Note (Addendum)
 Gastroenterology Consult   Referring Provider: No ref. provider found Primary Care Physician:  Jonah Negus, NP Primary Gastroenterologist:  not established   Patient ID: DYMIR NEESON; [id number]; 01-02-1956   Admit date: 03/09/2024  LOS: 0 days   Date of Consultation: 03/09/2024  Reason for Consultation:  Anemia, melena, coffee ground emesis   History of Present Illness   LEODAN BOLYARD is a 68 y.o. year old male with history of asthma, COPD, HTN, HLD, who presented to the ED with generalized weakness x 1 week.  Endorse some associated anorexia and fatigue, 1 episode of nausea and vomiting last week and 1 episode of black stool last week.  Endorsed epigastric pain as well.  Reported outpatient hemoglobin of 7.1 but never followed up on this.  GI consulted for further evaluation.    Hemoglobin 6.7 on arrival, transfusion underway  Platelets 480K Occult stool positive  Consult: Reports labs the week before last, with hgb around 7. Did not follow up further after this. Reports SOB, fatigue, weakness and epigastric pain since last Thursday. Had one episode of vomiting last Thursday that was black and foul smelling, one melanotic stool last Thursday as well. Denies any BMs since then. Poor PO intake since then. Nausea is better now. Has had some heartburn intermittently. Endorses high NSAID use, using approximately 10 aleve  per 2 days when working for chronic knee pain/hip pain. Has not seen any BRB per rectum or in stool or emesis, denies changes in appetite or weight loss prior to acute onset of illness. Denies alcohol use. No dysphagia or odynophagia. Endorses a few ounces of water  intake around 530 this morning, no food.   Last EGD: never   Last colonoscopy: March 2025, [place], normal per patient, repeat in 10 years    Past Medical History:  Diagnosis Date   Asthma    COPD (chronic obstructive pulmonary disease) (HCC)     Past Surgical History:  Procedure Laterality  Date   KNEE SURGERY      Prior to Admission medications   Medication Sig Start Date End Date Taking? Authorizing Provider  diltiazem  (CARDIZEM  CD) 360 MG 24 hr capsule Take 360 mg by mouth daily. 02/16/24  Yes [provider]  hydrochlorothiazide  (HYDRODIURIL ) 25 MG tablet Take 25 mg by mouth daily. 02/16/24  Yes [provider]  HYDROcodone -acetaminophen  (NORCO) 10-325 MG tablet Take 1 tablet by mouth every 4 (four) hours as needed. 02/10/24  Yes [provider]  rOPINIRole  (REQUIP ) 1 MG tablet Take 1 mg by mouth daily. 02/16/24  Yes [provider]  terazosin (HYTRIN) 10 MG capsule Take 10 mg by mouth daily. 02/16/24  Yes [provider]  traZODone  (DESYREL ) 150 MG tablet Take 150 mg by mouth at bedtime as needed. 02/16/24  Yes [provider]  albuterol  (VENTOLIN  HFA) 108 (90 Base) MCG/ACT inhaler Inhale 2 puffs into the lungs every 6 (six) hours as needed for shortness of breath.    [provider]  amoxicillin -clavulanate (AUGMENTIN ) 875-125 MG tablet Take 1 tablet by mouth every 12 (twelve) hours. 06/23/22   Wynetta Heckle, MD  atorvastatin  (LIPITOR) 40 MG tablet Take 40 mg by mouth daily.    [provider]  benazepril  (LOTENSIN ) 40 MG tablet Take 40 mg by mouth daily. 09/18/20   [provider]  cyclobenzaprine  (FLEXERIL ) 10 MG tablet Take 10 mg by mouth at bedtime. 09/17/20   [provider]  dextromethorphan -guaiFENesin  (MUCINEX  DM) 30-600 MG 12hr tablet Take 1  tablet by mouth 2 (two) times daily. 06/07/22   Akula, Vijaya, MD  diltiazem  (CARDIZEM  CD) 300 MG 24 hr capsule Take 300 mg by mouth daily. 09/18/20   [provider]  fluticasone  furoate-vilanterol (BREO ELLIPTA ) 100-25 MCG/ACT AEPB Inhale 1 puff into the lungs daily. 06/23/22   Wynetta Heckle, MD  HYDROcodone -acetaminophen  (NORCO/VICODIN) 5-325 MG tablet Take 2 tablets by mouth every 6 (six) hours as needed for moderate pain or severe pain. 08/17/20    [provider]  Ipratropium-Albuterol  (COMBIVENT  RESPIMAT) 20-100 MCG/ACT AERS respimat Inhale 1 puff into the lungs every 6 (six) hours as needed for wheezing. 10/08/20   Mason Sole, Pratik D, DO  nicotine  (NICODERM CQ  - DOSED IN MG/24 HOURS) 21 mg/24hr patch Place 1 patch (21 mg total) onto the skin daily. 06/24/22   Wynetta Heckle, MD  rOPINIRole  (REQUIP ) 0.25 MG tablet Take 0.75 mg by mouth at bedtime. 09/18/20   [provider]  traZODone  (DESYREL ) 50 MG tablet Take 50 mg by mouth at bedtime. 09/26/20   [provider]  umeclidinium bromide  (INCRUSE ELLIPTA ) 62.5 MCG/ACT AEPB Inhale 1 puff into the lungs daily. 06/23/22   Wynetta Heckle, MD    Current Facility-Administered Medications  Medication Dose Route Frequency Provider Last Rate Last Admin   0.9 %  sodium chloride  infusion (Manually program via Guardrails IV Fluids)   Intravenous Once Mordecai Applebaum, MD       pantoprazole  (PROTONIX ) injection 80 mg  80 mg Intravenous Once Mordecai Applebaum, MD       Current Outpatient Medications  Medication Sig Dispense Refill   diltiazem  (CARDIZEM  CD) 360 MG 24 hr capsule Take 360 mg by mouth daily.     hydrochlorothiazide  (HYDRODIURIL ) 25 MG tablet Take 25 mg by mouth daily.     HYDROcodone -acetaminophen  (NORCO) 10-325 MG tablet Take 1 tablet by mouth every 4 (four) hours as needed.     rOPINIRole  (REQUIP ) 1 MG tablet Take 1 mg by mouth daily.     terazosin (HYTRIN) 10 MG capsule Take 10 mg by mouth daily.     traZODone  (DESYREL ) 150 MG tablet Take 150 mg by mouth at bedtime as needed.     albuterol  (VENTOLIN  HFA) 108 (90 Base) MCG/ACT inhaler Inhale 2 puffs into the lungs every 6 (six) hours as needed for shortness of breath.     amoxicillin -clavulanate (AUGMENTIN ) 875-125 MG tablet Take 1 tablet by mouth every 12 (twelve) hours. 23 tablet 0   atorvastatin  (LIPITOR) 40 MG tablet Take 40 mg by mouth daily.     benazepril  (LOTENSIN ) 40 MG tablet Take 40 mg by mouth  daily.     cyclobenzaprine  (FLEXERIL ) 10 MG tablet Take 10 mg by mouth at bedtime.     dextromethorphan -guaiFENesin  (MUCINEX  DM) 30-600 MG 12hr tablet Take 1 tablet by mouth 2 (two) times daily. 30 tablet 1   diltiazem  (CARDIZEM  CD) 300 MG 24 hr capsule Take 300 mg by mouth daily.     fluticasone  furoate-vilanterol (BREO ELLIPTA ) 100-25 MCG/ACT AEPB Inhale 1 puff into the lungs daily. 28 each 0   HYDROcodone -acetaminophen  (NORCO/VICODIN) 5-325 MG tablet Take 2 tablets by mouth every 6 (six) hours as needed for moderate pain or severe pain.     Ipratropium-Albuterol  (COMBIVENT  RESPIMAT) 20-100 MCG/ACT AERS respimat Inhale 1 puff into the lungs every 6 (six) hours as needed for wheezing. 4 g 1   nicotine  (NICODERM CQ  - DOSED IN MG/24 HOURS) 21 mg/24hr patch Place 1 patch (21 mg total)  onto the skin daily. 28 patch 0   rOPINIRole  (REQUIP ) 0.25 MG tablet Take 0.75 mg by mouth at bedtime.     traZODone  (DESYREL ) 50 MG tablet Take 50 mg by mouth at bedtime.     umeclidinium bromide  (INCRUSE ELLIPTA ) 62.5 MCG/ACT AEPB Inhale 1 puff into the lungs daily. 30 each 0    Allergies as of 03/09/2024 - Review Complete 03/09/2024  Allergen Reaction Noted   Motrin [ibuprofen] Swelling 10/07/2020    Review of Systems   Gen: Denies any fever, chills, loss of appetite, change in weight or weight loss CV: Denies chest pain, heart palpitations, syncope, edema  Resp: Denies shortness of breath with rest, cough, wheezing, coughing up blood, and pleurisy. GI:  denies hematochezia, diarrhea, constipation, dysphagia, odyonophagia, early satiety or weight loss. +melena, +black stools, +nausea +vomiting  GU : Denies urinary burning, blood in urine, urinary frequency, and urinary incontinence. MS: Denies joint pain, limitation of movement, swelling, cramps, and atrophy.  Derm: Denies rash, itching, dry skin, hives. Psych: Denies depression, anxiety, memory loss, hallucinations, and confusion. Heme: Denies bruising or  bleeding Neuro:  Denies any headaches, dizziness, paresthesias, shaking  Physical Exam   Vital Signs in last 24 hours: Temp:  [98.2 F (36.8 C)] 98.2 F (36.8 C) (05/28 0713) Pulse Rate:  [72-76] 76 (05/28 0745) Resp:  [16] 16 (05/28 0713) BP: (123-143)/(69-78) 143/69 (05/28 0745) SpO2:  [92 %-100 %] 92 % (05/28 0745) Weight:  [78 kg] 78 kg (05/28 0711)   General:   Alert,  Well-developed, well-nourished, pleasant and cooperative in NAD Head:  Normocephalic and atraumatic. Eyes:  Sclera clear, no icterus.   Conjunctiva pink. Ears:  Normal auditory acuity. Mouth:  No deformity or lesions, dentition normal. Neck:  Supple; no masses Lungs:  Clear throughout to auscultation.   No wheezes, crackles, or rhonchi. No acute distress. Heart:  Regular rate and rhythm; no murmurs, clicks, rubs,  or gallops. Abdomen:  Soft, and nondistended. TTP to epigastric region. No masses, hepatosplenomegaly or hernias noted. Normal bowel sounds, without guarding, and without rebound.   Msk:  Symmetrical without gross deformities. Normal posture. Extremities:  Without clubbing or edema. Neurologic:  Alert and  oriented x4. Skin:  Intact without significant lesions or rashes. Psych:  Alert and cooperative. Normal mood and affect.   Labs/Studies   Recent Labs Recent Labs    03/09/24 0801  WBC 10.6*  HGB 6.7*  HCT 21.2*  PLT 480*   BMET Recent Labs    03/09/24 0801  NA 136  K 3.6  CL 103  CO2 23  GLUCOSE 112*  BUN 29*  CREATININE 1.26*  CALCIUM  9.1   LFT Recent Labs    03/09/24 0801  PROT 6.4*  ALBUMIN 3.1*  AST 14*  ALT 7  ALKPHOS 111  BILITOT 0.3    Assessment   ENRIGUE HASHIMI is a 68 y.o. year old male with history of asthma, COPD, HTN, HLD, who presented to the ED with generalized weakness x 1 week as well as anorexia, fatigue and 1 episode of melena last week.hgb 6.7 on arrival. GI consulted for further evaluation.    Symptomatic anemia/nausea/vomiting/melena: hgb 6.7  on arrival, outpatient labs with hgb around 7. no previous EGD, normal colonoscopy per patient in march. Frequent Aleve  use recently. Endorses epigastric pain, melena/black emesis last week x1. Not on ACs. BUN elevated at 29, presentation concerning for UGI bleed in setting of frequent NSAID use, recommending EGD for further evaluation as I cannot  rule out PUD, gastritis, duodenitis, esophagitis. Blood transfusion currently underway, will plan for EGD tomorrow as long as hgb stable to give adequate time for response to blood transfusion. Indications, risks and benefits of procedure discussed in detail with patient. Patient verbalized understanding and is in agreement to proceed with EGD.   Plan / Recommendations   Clear liquids, NPO midnight  Plan for EGD tomorrow   PPI BID 4. Trend h&h, transfuse as needed 5. Monitor for overt GI bleeding 6. NSAID cessation    03/09/2024, 9:11 AM  Brandin Stetzer L. Malissa Slay, MSN, APRN, AGNP-C Adult-Gerontology Nurse Practitioner [facility]

## 2024-03-09 NOTE — TOC CM/SW Note (Signed)
 Transition of Care [facility]) - Inpatient Brief Assessment   Patient Details  Name: NEVEN FINA MRN: [id number] Date of Birth: 1955-12-28  Transition of Care [facility]) CM/SW Contact:    Grandville Lax, LCSWA Phone Number: 03/09/2024, 12:12 PM   Clinical Narrative: Transition of Care Department [facility]) has reviewed patient and no TOC needs have been identified at this time. We will continue to monitor patient advancement through interdiciplinary progression rounds. If new patient transition needs arise, please place a TOC consult.   Transition of Care Asessment: Insurance and Status: Insurance coverage has been reviewed Patient has primary care physician: Yes Home environment has been reviewed: From home Prior level of function:: Independent Prior/Current Home Services: No current home services Social Drivers of Health Review: SDOH reviewed no interventions necessary Readmission risk has been reviewed: Yes Transition of care needs: no transition of care needs at this time

## 2024-03-09 NOTE — H&P (View-Only) (Signed)
 Gastroenterology Consult   Referring Provider: No ref. provider found Primary Care Physician:  Jonah Negus, NP Primary Gastroenterologist:  not established   Patient ID: David Callahan; [id number]; 19-May-1956   Admit date: 03/09/2024  LOS: 0 days   Date of Consultation: 03/09/2024  Reason for Consultation:  Anemia, melena, coffee ground emesis   History of Present Illness   David Callahan is a 68 y.o. year old male with history of asthma, COPD, HTN, HLD, who presented to the ED with generalized weakness x 1 week.  Endorse some associated anorexia and fatigue, 1 episode of nausea and vomiting last week and 1 episode of black stool last week.  Endorsed epigastric pain as well.  Reported outpatient hemoglobin of 7.1 but never followed up on this.  GI consulted for further evaluation.    Hemoglobin 6.7 on arrival, transfusion underway  Platelets 480K Occult stool positive  Consult: Reports labs the week before last, with hgb around 7. Did not follow up further after this. Reports SOB, fatigue, weakness and epigastric pain since last Thursday. Had one episode of vomiting last Thursday that was black and foul smelling, one melanotic stool last Thursday as well. Denies any BMs since then. Poor PO intake since then. Nausea is better now. Has had some heartburn intermittently. Endorses high NSAID use, using approximately 10 aleve  per 2 days when working for chronic knee pain/hip pain. Has not seen any BRB per rectum or in stool or emesis, denies changes in appetite or weight loss prior to acute onset of illness. Denies alcohol use. No dysphagia or odynophagia. Endorses a few ounces of water  intake around 530 this morning, no food.   Last EGD: never   Last colonoscopy: March 2025, [place], normal per patient, repeat in 10 years    Past Medical History:  Diagnosis Date   Asthma    COPD (chronic obstructive pulmonary disease) (HCC)     Past Surgical History:  Procedure Laterality  Date   KNEE SURGERY      Prior to Admission medications   Medication Sig Start Date End Date Taking? Authorizing Provider  diltiazem  (CARDIZEM  CD) 360 MG 24 hr capsule Take 360 mg by mouth daily. 02/16/24  Yes [provider]  hydrochlorothiazide  (HYDRODIURIL ) 25 MG tablet Take 25 mg by mouth daily. 02/16/24  Yes [provider]  HYDROcodone -acetaminophen  (NORCO) 10-325 MG tablet Take 1 tablet by mouth every 4 (four) hours as needed. 02/10/24  Yes [provider]  rOPINIRole  (REQUIP ) 1 MG tablet Take 1 mg by mouth daily. 02/16/24  Yes [provider]  terazosin (HYTRIN) 10 MG capsule Take 10 mg by mouth daily. 02/16/24  Yes [provider]  traZODone  (DESYREL ) 150 MG tablet Take 150 mg by mouth at bedtime as needed. 02/16/24  Yes [provider]  albuterol  (VENTOLIN  HFA) 108 (90 Base) MCG/ACT inhaler Inhale 2 puffs into the lungs every 6 (six) hours as needed for shortness of breath.    [provider]  amoxicillin -clavulanate (AUGMENTIN ) 875-125 MG tablet Take 1 tablet by mouth every 12 (twelve) hours. 06/23/22   Wynetta Heckle, MD  atorvastatin  (LIPITOR) 40 MG tablet Take 40 mg by mouth daily.    [provider]  benazepril  (LOTENSIN ) 40 MG tablet Take 40 mg by mouth daily. 09/18/20   [provider]  cyclobenzaprine  (FLEXERIL ) 10 MG tablet Take 10 mg by mouth at bedtime. 09/17/20   [provider]  dextromethorphan -guaiFENesin  (MUCINEX  DM) 30-600 MG 12hr tablet Take 1  tablet by mouth 2 (two) times daily. 06/07/22   Akula, Vijaya, MD  diltiazem  (CARDIZEM  CD) 300 MG 24 hr capsule Take 300 mg by mouth daily. 09/18/20   [provider]  fluticasone  furoate-vilanterol (BREO ELLIPTA ) 100-25 MCG/ACT AEPB Inhale 1 puff into the lungs daily. 06/23/22   Wynetta Heckle, MD  HYDROcodone -acetaminophen  (NORCO/VICODIN) 5-325 MG tablet Take 2 tablets by mouth every 6 (six) hours as needed for moderate pain or severe pain. 08/17/20    [provider]  Ipratropium-Albuterol  (COMBIVENT  RESPIMAT) 20-100 MCG/ACT AERS respimat Inhale 1 puff into the lungs every 6 (six) hours as needed for wheezing. 10/08/20   Mason Sole, Pratik D, DO  nicotine  (NICODERM CQ  - DOSED IN MG/24 HOURS) 21 mg/24hr patch Place 1 patch (21 mg total) onto the skin daily. 06/24/22   Wynetta Heckle, MD  rOPINIRole  (REQUIP ) 0.25 MG tablet Take 0.75 mg by mouth at bedtime. 09/18/20   [provider]  traZODone  (DESYREL ) 50 MG tablet Take 50 mg by mouth at bedtime. 09/26/20   [provider]  umeclidinium bromide  (INCRUSE ELLIPTA ) 62.5 MCG/ACT AEPB Inhale 1 puff into the lungs daily. 06/23/22   Wynetta Heckle, MD    Current Facility-Administered Medications  Medication Dose Route Frequency Provider Last Rate Last Admin   0.9 %  sodium chloride  infusion (Manually program via Guardrails IV Fluids)   Intravenous Once Mordecai Applebaum, MD       pantoprazole  (PROTONIX ) injection 80 mg  80 mg Intravenous Once Mordecai Applebaum, MD       Current Outpatient Medications  Medication Sig Dispense Refill   diltiazem  (CARDIZEM  CD) 360 MG 24 hr capsule Take 360 mg by mouth daily.     hydrochlorothiazide  (HYDRODIURIL ) 25 MG tablet Take 25 mg by mouth daily.     HYDROcodone -acetaminophen  (NORCO) 10-325 MG tablet Take 1 tablet by mouth every 4 (four) hours as needed.     rOPINIRole  (REQUIP ) 1 MG tablet Take 1 mg by mouth daily.     terazosin (HYTRIN) 10 MG capsule Take 10 mg by mouth daily.     traZODone  (DESYREL ) 150 MG tablet Take 150 mg by mouth at bedtime as needed.     albuterol  (VENTOLIN  HFA) 108 (90 Base) MCG/ACT inhaler Inhale 2 puffs into the lungs every 6 (six) hours as needed for shortness of breath.     amoxicillin -clavulanate (AUGMENTIN ) 875-125 MG tablet Take 1 tablet by mouth every 12 (twelve) hours. 23 tablet 0   atorvastatin  (LIPITOR) 40 MG tablet Take 40 mg by mouth daily.     benazepril  (LOTENSIN ) 40 MG tablet Take 40 mg by mouth  daily.     cyclobenzaprine  (FLEXERIL ) 10 MG tablet Take 10 mg by mouth at bedtime.     dextromethorphan -guaiFENesin  (MUCINEX  DM) 30-600 MG 12hr tablet Take 1 tablet by mouth 2 (two) times daily. 30 tablet 1   diltiazem  (CARDIZEM  CD) 300 MG 24 hr capsule Take 300 mg by mouth daily.     fluticasone  furoate-vilanterol (BREO ELLIPTA ) 100-25 MCG/ACT AEPB Inhale 1 puff into the lungs daily. 28 each 0   HYDROcodone -acetaminophen  (NORCO/VICODIN) 5-325 MG tablet Take 2 tablets by mouth every 6 (six) hours as needed for moderate pain or severe pain.     Ipratropium-Albuterol  (COMBIVENT  RESPIMAT) 20-100 MCG/ACT AERS respimat Inhale 1 puff into the lungs every 6 (six) hours as needed for wheezing. 4 g 1   nicotine  (NICODERM CQ  - DOSED IN MG/24 HOURS) 21 mg/24hr patch Place 1 patch (21 mg total)  onto the skin daily. 28 patch 0   rOPINIRole  (REQUIP ) 0.25 MG tablet Take 0.75 mg by mouth at bedtime.     traZODone  (DESYREL ) 50 MG tablet Take 50 mg by mouth at bedtime.     umeclidinium bromide  (INCRUSE ELLIPTA ) 62.5 MCG/ACT AEPB Inhale 1 puff into the lungs daily. 30 each 0    Allergies as of 03/09/2024 - Review Complete 03/09/2024  Allergen Reaction Noted   Motrin [ibuprofen] Swelling 10/07/2020    Review of Systems   Gen: Denies any fever, chills, loss of appetite, change in weight or weight loss CV: Denies chest pain, heart palpitations, syncope, edema  Resp: Denies shortness of breath with rest, cough, wheezing, coughing up blood, and pleurisy. GI:  denies hematochezia, diarrhea, constipation, dysphagia, odyonophagia, early satiety or weight loss. +melena, +black stools, +nausea +vomiting  GU : Denies urinary burning, blood in urine, urinary frequency, and urinary incontinence. MS: Denies joint pain, limitation of movement, swelling, cramps, and atrophy.  Derm: Denies rash, itching, dry skin, hives. Psych: Denies depression, anxiety, memory loss, hallucinations, and confusion. Heme: Denies bruising or  bleeding Neuro:  Denies any headaches, dizziness, paresthesias, shaking  Physical Exam   Vital Signs in last 24 hours: Temp:  [98.2 F (36.8 C)] 98.2 F (36.8 C) (05/28 0713) Pulse Rate:  [72-76] 76 (05/28 0745) Resp:  [16] 16 (05/28 0713) BP: (123-143)/(69-78) 143/69 (05/28 0745) SpO2:  [92 %-100 %] 92 % (05/28 0745) Weight:  [78 kg] 78 kg (05/28 0711)   General:   Alert,  Well-developed, well-nourished, pleasant and cooperative in NAD Head:  Normocephalic and atraumatic. Eyes:  Sclera clear, no icterus.   Conjunctiva pink. Ears:  Normal auditory acuity. Mouth:  No deformity or lesions, dentition normal. Neck:  Supple; no masses Lungs:  Clear throughout to auscultation.   No wheezes, crackles, or rhonchi. No acute distress. Heart:  Regular rate and rhythm; no murmurs, clicks, rubs,  or gallops. Abdomen:  Soft, and nondistended. TTP to epigastric region. No masses, hepatosplenomegaly or hernias noted. Normal bowel sounds, without guarding, and without rebound.   Msk:  Symmetrical without gross deformities. Normal posture. Extremities:  Without clubbing or edema. Neurologic:  Alert and  oriented x4. Skin:  Intact without significant lesions or rashes. Psych:  Alert and cooperative. Normal mood and affect.   Labs/Studies   Recent Labs Recent Labs    03/09/24 0801  WBC 10.6*  HGB 6.7*  HCT 21.2*  PLT 480*   BMET Recent Labs    03/09/24 0801  NA 136  K 3.6  CL 103  CO2 23  GLUCOSE 112*  BUN 29*  CREATININE 1.26*  CALCIUM  9.1   LFT Recent Labs    03/09/24 0801  PROT 6.4*  ALBUMIN 3.1*  AST 14*  ALT 7  ALKPHOS 111  BILITOT 0.3    Assessment   David Callahan is a 68 y.o. year old male with history of asthma, COPD, HTN, HLD, who presented to the ED with generalized weakness x 1 week as well as anorexia, fatigue and 1 episode of melena last week.hgb 6.7 on arrival. GI consulted for further evaluation.    Symptomatic anemia/nausea/vomiting/melena: hgb 6.7  on arrival, outpatient labs with hgb around 7. no previous EGD, normal colonoscopy per patient in march. Frequent Aleve  use recently. Endorses epigastric pain, melena/black emesis last week x1. Not on ACs. BUN elevated at 29, presentation concerning for UGI bleed in setting of frequent NSAID use, recommending EGD for further evaluation as I cannot  rule out PUD, gastritis, duodenitis, esophagitis. Blood transfusion currently underway, will plan for EGD tomorrow as long as hgb stable to give adequate time for response to blood transfusion. Indications, risks and benefits of procedure discussed in detail with patient. Patient verbalized understanding and is in agreement to proceed with EGD.   Plan / Recommendations   Clear liquids, NPO midnight  Plan for EGD tomorrow   PPI BID 4. Trend h&h, transfuse as needed 5. Monitor for overt GI bleeding 6. NSAID cessation    03/09/2024, 9:11 AM  Raymond Bhardwaj L. Bryce Cheever, MSN, APRN, AGNP-C Adult-Gerontology Nurse Practitioner [facility]

## 2024-03-09 NOTE — Plan of Care (Signed)

## 2024-03-09 NOTE — Progress Notes (Signed)
 Per lab, repeat Hgb s/p one unit PRBC transfusion is now 6.9. MD Tat notified.

## 2024-03-09 NOTE — Hospital Course (Signed)
 68 year old male with a history of COPD, tobacco abuse, osteoarthritis, hypertension, restless leg syndrome presenting with generalized weakness for about a week.  The patient states that he has had some increased dyspnea on exertion during this period of time but denies any chest pain, coughing, hemoptysis.  He was contacted by his PCP about 2 weeks prior to this admission and told that he had a hemoglobin that was lower than usual in the low 7 range.  For the past week, the patient has been having intermittent melanotic stools.  He also 1 episode of emesis with coffee grounds. He denies any fevers, chills, headache, dizziness.  He does have some upper abdominal pain. Notably, the patient states that he has been taking up to 10 Aleve  every other day.  He denies any alcohol use.  He denies any dysuria, hematuria. In the ED, the patient was afebrile hemodynamically stable with oxygen  saturation 100% room air.  WBC 10.6, hemoglobin 6.7, platelets 480.  Sodium 136, potassium 3.6, bicarbonate 23, serum creatinine 1.26.  Troponin 8>> 7.  EKG showed sinus rhythm nonspecific ST changes.  Chest x-ray showed hyperinflation and chronic interstitial markings.  GI was consulted to assist with management.  He was transfused 1 unit PRBC.

## 2024-03-09 NOTE — Progress Notes (Signed)
 Pt has tolerated clear liquid diet without abd pain, n/v. Ambulated with standby assist to BR, steady on feet. VSS. Received 1 unit PRBC today without complication.

## 2024-03-09 NOTE — ED Notes (Signed)
 Date and time results received: 03/09/24 0836 (use smartphrase ".now" to insert current time)  Test:  Hemoglobin   Critical Value: 6.7  Name of Provider Notified: Dr.Scheving  Orders Received? Or Actions Taken?: md notified

## 2024-03-09 NOTE — ED Provider Notes (Signed)
 [place] EMERGENCY DEPARTMENT AT [facility] Provider Note  CSN: [id number] Arrival date & time: 03/09/24 4098  Chief Complaint(s) Weakness  HPI David Callahan is a 68 y.o. male history of COPD, hypertension, hyperlipidemia presenting to the emergency department with generalized weakness.  Patient reports 1 week of generalized weakness which is worsening.  Reports associated anorexia and fatigue.  Reports had 1 episode of nausea and vomiting last week also 1 episode of black stool last week, subsequently no further episodes.  Reports that he has been having some intermittent epigastric pain.  Apparently had labs drawn recently and was told his hemoglobin was 7.1 but never followed up with anybody.  No fevers or chills.  No cough, runny nose, sore throat.  No diarrhea.  No rashes.  No headaches.  No falls or injury.   Past Medical History Past Medical History:  Diagnosis Date   Asthma    COPD (chronic obstructive pulmonary disease) (HCC)    Patient Active Problem List   Diagnosis Date Noted   Acute blood loss anemia 03/09/2024   Pleural effusion 06/14/2022   Thrombocytosis 06/05/2022   AKI (acute kidney injury) (HCC) 06/05/2022   Hyperglycemia 06/05/2022   Hyponatremia 06/05/2022   Hypokalemia 06/05/2022   Restless leg syndrome 06/05/2022   Mixed hyperlipidemia 06/05/2022   CAP (community acquired pneumonia) 06/04/2022   Leukocytosis 03/25/2022   COPD with acute bronchitis (HCC) 10/07/2020   COPD (chronic obstructive pulmonary disease) (HCC) 10/07/2020   Bronchitis    Acute respiratory failure with hypoxia (HCC)    Home Medication(s) Prior to Admission medications   Medication Sig Start Date End Date Taking? Authorizing Provider  albuterol  (VENTOLIN  HFA) 108 (90 Base) MCG/ACT inhaler Inhale 2 puffs into the lungs every 6 (six) hours as needed for shortness of breath.   Yes [provider]  atorvastatin  (LIPITOR) 40 MG tablet Take 40 mg by mouth daily.    Yes [provider]  benazepril  (LOTENSIN ) 40 MG tablet Take 40 mg by mouth daily. 09/18/20  Yes [provider]  cyclobenzaprine  (FLEXERIL ) 10 MG tablet Take 10 mg by mouth at bedtime. 09/17/20  Yes [provider]  diltiazem  (CARDIZEM  CD) 360 MG 24 hr capsule Take 360 mg by mouth daily. 02/16/24  Yes [provider]  hydrochlorothiazide  (HYDRODIURIL ) 25 MG tablet Take 25 mg by mouth daily. 02/16/24  Yes [provider]  HYDROcodone -acetaminophen  (NORCO) 10-325 MG tablet Take 1 tablet by mouth every 4 (four) hours as needed. 02/10/24  Yes [provider]  rOPINIRole  (REQUIP ) 1 MG tablet Take 1 mg by mouth daily. 02/16/24  Yes [provider]  terazosin (HYTRIN) 10 MG capsule Take 10 mg by mouth daily. 02/16/24  Yes [provider]  traZODone  (DESYREL ) 150 MG tablet Take 150 mg by mouth at bedtime as needed. 02/16/24  Yes [provider]  amoxicillin -clavulanate (AUGMENTIN ) 875-125 MG tablet Take 1 tablet by mouth every 12 (twelve) hours. 06/23/22   Wynetta Heckle, MD  umeclidinium bromide  (INCRUSE ELLIPTA ) 62.5 MCG/ACT AEPB Inhale 1 puff into the lungs daily. 06/23/22   Wynetta Heckle, MD  Past Surgical History Past Surgical History:  Procedure Laterality Date   KNEE SURGERY     Family History History reviewed. No pertinent family history.  Social History Social History   Tobacco Use   Smoking status: Some Days    Current packs/day: 0.50    Types: Cigarettes   Smokeless tobacco: Never  Vaping Use   Vaping status: Never Used  Substance Use Topics   Alcohol use: Yes    Comment: ocassional homemade moonshine per pt   Drug use: Never   Allergies Motrin [ibuprofen]  Review of Systems Review of Systems  All other systems reviewed and are negative.   Physical Exam Vital Signs  I have  reviewed the triage vital signs BP 135/66   Pulse 96   Temp 98.2 F (36.8 C) (Oral)   Resp 15   Ht 5\' 10"  (1.778 m)   Wt 78 kg   SpO2 94%   BMI 24.68 kg/m  Physical Exam Vitals and nursing note reviewed.  Constitutional:      General: He is not in acute distress.    Appearance: Normal appearance.  HENT:     Mouth/Throat:     Mouth: Mucous membranes are dry.  Eyes:     Conjunctiva/sclera: Conjunctivae normal.  Cardiovascular:     Rate and Rhythm: Normal rate and regular rhythm.  Pulmonary:     Effort: Pulmonary effort is normal. No respiratory distress.     Breath sounds: Normal breath sounds.  Abdominal:     General: Abdomen is flat.     Palpations: Abdomen is soft.     Tenderness: There is no abdominal tenderness.  Genitourinary:    Comments: Chaperoned by RN small melena +FOBT Musculoskeletal:     Right lower leg: No edema.     Left lower leg: No edema.  Skin:    General: Skin is warm and dry.     Capillary Refill: Capillary refill takes less than 2 seconds.     Coloration: Skin is pale.  Neurological:     Mental Status: He is alert and oriented to person, place, and time. Mental status is at baseline.  Psychiatric:        Mood and Affect: Mood normal.        Behavior: Behavior normal.     ED Results and Treatments Labs (all labs ordered are listed, but only abnormal results are displayed) Labs Reviewed  COMPREHENSIVE METABOLIC PANEL WITH GFR - Abnormal; Notable for the following components:      Result Value   Glucose, Bld 112 (*)    BUN 29 (*)    Creatinine, Ser 1.26 (*)    Total Protein 6.4 (*)    Albumin 3.1 (*)    AST 14 (*)    All other components within normal limits  CBC WITH DIFFERENTIAL/PLATELET - Abnormal; Notable for the following components:   WBC 10.6 (*)    RBC 2.26 (*)    Hemoglobin 6.7 (*)    HCT 21.2 (*)    RDW 15.9 (*)    Platelets 480 (*)    Neutro Abs 8.6 (*)    All other components within normal limits  LIPASE, BLOOD   OCCULT BLOOD X 1 CARD TO LAB, STOOL  URINALYSIS, W/ REFLEX TO CULTURE (INFECTION SUSPECTED)  POC OCCULT BLOOD, ED  TYPE AND SCREEN  ABO/RH  PREPARE RBC (CROSSMATCH)  TROPONIN I (HIGH SENSITIVITY)  TROPONIN I (HIGH SENSITIVITY)  Radiology DG Chest Port 1 View Result Date: 03/09/2024 CLINICAL DATA:  68 year old male with weakness. EXAM: PORTABLE CHEST 1 VIEW COMPARISON:  Chest radiographs 07/01/2022 and earlier. FINDINGS: Portable AP upright view at 0735 hours. Lungs appear somewhat hyperinflated, with improved left lung size and ventilation since 2023. Mediastinal contours are within normal limits. Visualized tracheal air column is within normal limits. Allowing for portable technique the lungs are clear. No pneumothorax or pleural effusion. Negative visible bowel gas. No acute osseous abnormality identified. Chronic severe right glenohumeral degeneration. IMPRESSION: No acute cardiopulmonary abnormality. Electronically Signed   By: Marlise Simpers M.D.   On: 03/09/2024 07:54    Pertinent labs & imaging results that were available during my care of the patient were reviewed by me and considered in my medical decision making (see MDM for details).  Medications Ordered in ED Medications  alum & mag hydroxide-simeth (MAALOX/MYLANTA) 200-200-20 MG/5ML suspension 30 mL (30 mLs Oral Given 03/09/24 0820)  sodium chloride  0.9 % bolus 1,000 mL (0 mLs Intravenous Stopped 03/09/24 0918)  pantoprazole (PROTONIX) injection 80 mg (80 mg Intravenous Given 03/09/24 0911)  0.9 %  sodium chloride  infusion (Manually program via Guardrails IV Fluids) ( Intravenous New Bag/Given 03/09/24 0917)                                                                                                                                     Procedures .Critical Care  Performed by: Mordecai Applebaum, MD Authorized by:  Mordecai Applebaum, MD   Critical care provider statement:    Critical care time (minutes):  30   Critical care was time spent personally by me on the following activities:  Development of treatment plan with patient or surrogate, discussions with consultants, evaluation of patient's response to treatment, examination of patient, ordering and review of laboratory studies, ordering and review of radiographic studies, ordering and performing treatments and interventions, pulse oximetry, re-evaluation of patient's condition and review of old charts   (including critical care time)  Medical Decision Making / ED Course   MDM:  68 year old presenting to the emergency department with generalized weakness.  Patient overall well-appearing, vital signs normal, does appear mildly dehydrated and pale.  Differential weakness includes anemia given recent low hemoglobin, episode of black stool, check CBC.  Also includes infectious process, will check chest x-ray, urinalysis "really having any of the symptoms, denies fevers or other focal infectious symptoms.  Differential includes toxic metabolic process such as dehydration, hyponatremia will check labs.  Low concern for cardiac cause but given epigastric pain will check troponin and EKG.  Patient reports of epigastric pain but is no tenderness on exam, will check lipase differential also includes gastritis, trial Maalox.  If LFTs or lipase are abnormal may consider CT scan although no tenderness on exam to suggest focal acute intra-abdominal process like perforation, cholecystitis, pancreatitis.  Will reassess.  Clinical Course as of 03/09/24 [phone]  Wed Mar 09, 2024  0952 Hgb 6.7. Positive occult blood in stool. Will transfuse and admit. GI consult placed. Signed out to hospitalist. Pt received protonix.  [WS]    Clinical Course User Index [WS] Mordecai Applebaum, MD     Additional history obtained:  -External records from outside source obtained  and reviewed including: Chart review including previous notes, labs, imaging, consultation notes including prior notes    Lab Tests: -I ordered, reviewed, and interpreted labs.   The pertinent results include:   Labs Reviewed  COMPREHENSIVE METABOLIC PANEL WITH GFR - Abnormal; Notable for the following components:      Result Value   Glucose, Bld 112 (*)    BUN 29 (*)    Creatinine, Ser 1.26 (*)    Total Protein 6.4 (*)    Albumin 3.1 (*)    AST 14 (*)    All other components within normal limits  CBC WITH DIFFERENTIAL/PLATELET - Abnormal; Notable for the following components:   WBC 10.6 (*)    RBC 2.26 (*)    Hemoglobin 6.7 (*)    HCT 21.2 (*)    RDW 15.9 (*)    Platelets 480 (*)    Neutro Abs 8.6 (*)    All other components within normal limits  LIPASE, BLOOD  OCCULT BLOOD X 1 CARD TO LAB, STOOL  URINALYSIS, W/ REFLEX TO CULTURE (INFECTION SUSPECTED)  POC OCCULT BLOOD, ED  TYPE AND SCREEN  ABO/RH  PREPARE RBC (CROSSMATCH)  TROPONIN I (HIGH SENSITIVITY)  TROPONIN I (HIGH SENSITIVITY)    Notable for anemia   EKG   EKG Interpretation Date/Time:  Wednesday Mar 09 2024 08:09:36 EDT Ventricular Rate:  75 PR Interval:  171 QRS Duration:  90 QT Interval:  388 QTC Calculation: 434 R Axis:   75  Text Interpretation: Sinus rhythm Supraventricular bigeminy Nonspecific repol abnormality, diffuse leads Confirmed by Hiawatha Lout ([id number]) on 03/09/2024 8:11:57 AM          Medicines ordered and prescription drug management: Meds ordered this encounter  Medications   alum & mag hydroxide-simeth (MAALOX/MYLANTA) 200-200-20 MG/5ML suspension 30 mL   sodium chloride  0.9 % bolus 1,000 mL   pantoprazole (PROTONIX) injection 80 mg   0.9 %  sodium chloride  infusion (Manually program via Guardrails IV Fluids)    -I have reviewed the patients home medicines and have made adjustments as needed   Consultations Obtained: I requested consultation with the hospitalist,  and  discussed lab and imaging findings as well as pertinent plan - they recommend: admission   Reevaluation: After the interventions noted above, I reevaluated the patient and found that their symptoms have improved  Co morbidities that complicate the patient evaluation  Past Medical History:  Diagnosis Date   Asthma    COPD (chronic obstructive pulmonary disease) (HCC)       Dispostion: Disposition decision including need for hospitalization was considered, and patient admitted to the hospital.    Final Clinical Impression(s) / ED Diagnoses Final diagnoses:  Symptomatic anemia     This chart was dictated using voice recognition software.  Despite best efforts to proofread,  errors can occur which can change the documentation meaning.    Mordecai Applebaum, MD 03/09/24 [phone]

## 2024-03-09 NOTE — ED Triage Notes (Signed)
 Pt bib [facility] for weakness that started last Thursday that got worse this morning.

## 2024-03-09 NOTE — Care Management Obs Status (Signed)
 MEDICARE OBSERVATION STATUS NOTIFICATION   Patient Details  Name: David Callahan MRN: [id number] Date of Birth: 28-Jul-1956   Medicare Observation Status Notification Given:  Yes    Geraldina Klinefelter, RN 03/09/2024, 5:34 PM

## 2024-03-09 NOTE — H&P (Signed)
 History and Physical    Patient: David Callahan:[id number] DOB: 10-15-1955 DOA: 03/09/2024 DOS: the patient was seen and examined on 03/09/2024 PCP: Jonah Negus, NP  Patient coming from: Home  Chief Complaint:  Chief Complaint  Patient presents with   Weakness   HPI: David Callahan is a 68 year old male with a history of COPD, tobacco abuse, osteoarthritis, hypertension, restless leg syndrome presenting with generalized weakness for about a week.  The patient states that he has had some increased dyspnea on exertion during this period of time but denies any chest pain, coughing, hemoptysis.  He was contacted by his PCP about 2 weeks prior to this admission and told that he had a hemoglobin that was lower than usual in the low 7 range.  For the past week, the patient has been having intermittent melanotic stools.  He also 1 episode of emesis with coffee grounds. He denies any fevers, chills, headache, dizziness.  He does have some upper abdominal pain. Notably, the patient states that he has been taking up to 10 Aleve  every other day.  He denies any alcohol use.  He denies any dysuria, hematuria. In the ED, the patient was afebrile hemodynamically stable with oxygen  saturation 100% room air.  WBC 10.6, hemoglobin 6.7, platelets 480.  Sodium 136, potassium 3.6, bicarbonate 23, serum creatinine 1.26.  Troponin 8>> 7.  EKG showed sinus rhythm nonspecific ST changes.  Chest x-ray showed hyperinflation and chronic interstitial markings.  GI was consulted to assist with management.  He was transfused 1 unit PRBC.  Review of Systems: As mentioned in the history of present illness. All other systems reviewed and are negative. Past Medical History:  Diagnosis Date   Asthma    COPD (chronic obstructive pulmonary disease) (HCC)    Past Surgical History:  Procedure Laterality Date   KNEE SURGERY     Social History:  reports that he has been smoking cigarettes. He has never used smokeless  tobacco. He reports current alcohol use. He reports that he does not use drugs.  Allergies  Allergen Reactions   Motrin [Ibuprofen] Swelling    Anti-inflammatory analgesics- cause anaphylaxis    History reviewed. No pertinent family history.  Prior to Admission medications   Medication Sig Start Date End Date Taking? Authorizing Provider  albuterol  (VENTOLIN  HFA) 108 (90 Base) MCG/ACT inhaler Inhale 2 puffs into the lungs every 6 (six) hours as needed for shortness of breath.   Yes [provider]  atorvastatin  (LIPITOR) 40 MG tablet Take 40 mg by mouth daily.   Yes [provider]  benazepril  (LOTENSIN ) 40 MG tablet Take 40 mg by mouth daily. 09/18/20  Yes [provider]  cyclobenzaprine  (FLEXERIL ) 10 MG tablet Take 10 mg by mouth at bedtime. 09/17/20  Yes [provider]  diltiazem  (CARDIZEM  CD) 360 MG 24 hr capsule Take 360 mg by mouth daily. 02/16/24  Yes [provider]  hydrochlorothiazide  (HYDRODIURIL ) 25 MG tablet Take 25 mg by mouth daily. 02/16/24  Yes [provider]  HYDROcodone -acetaminophen  (NORCO) 10-325 MG tablet Take 1 tablet by mouth every 4 (four) hours as needed. 02/10/24  Yes [provider]  rOPINIRole  (REQUIP ) 1 MG tablet Take 1 mg by mouth daily. 02/16/24  Yes [provider]  terazosin (HYTRIN) 10 MG capsule Take 10 mg by mouth daily. 02/16/24  Yes [provider]  traZODone  (DESYREL ) 150 MG tablet Take 150 mg by mouth at bedtime as needed. 02/16/24  Yes [provider]  amoxicillin -clavulanate (AUGMENTIN ) 875-125 MG tablet Take 1 tablet by mouth every 12 (twelve) hours. 06/23/22   Wynetta Heckle, MD  umeclidinium bromide  (INCRUSE ELLIPTA ) 62.5 MCG/ACT AEPB Inhale 1 puff into the lungs daily. 06/23/22   Wynetta Heckle, MD    Physical Exam: Vitals:   03/09/24 0713 03/09/24 0745 03/09/24 0930 03/09/24 1007  BP: 123/78 (!) 143/69 135/66 (!) 162/77  Pulse: 72 76 96 77  Resp: 16  15 14   Temp:  98.2 F (36.8 C)  98.2 F (36.8 C) 98.2 F (36.8 C)  TempSrc: Oral  Oral Oral  SpO2: 100% 92% 94% 96%  Weight:      Height:       GENERAL:  A&O x 3, NAD, well developed, cooperative, follows commands HEENT: [place]/AT, No thrush, No icterus, No oral ulcers Neck:  No neck mass, No meningismus, soft, supple CV: RRR, no S3, no S4, no rub, no JVD Lungs:  diminished BS.  No wheeze.  Bibasilar rales. Abd: soft/NT +BS, nondistended Ext: No edema, no lymphangitis, no cyanosis, no rashes Neuro:  CN II-XII intact, strength 4/5 in RUE, RLE, strength 4/5 LUE, LLE; sensation intact bilateral; no dysmetria; babinski equivocal  Data Reviewed: Data reviewed above in history  Assessment and Plan: Melena/acute blood loss anemia -Presented with hemoglobin 6.7 - Baseline hemoglobin 8-9 - Concerned about peptic ulcer disease in the setting of chronic NSAID use - GI consult - Transfuse 1 unit PRBC -start pantoprazole bid - clear liquid diet for now  Tobacco abuse - Tobacco cessation discussed - He smokes cigars, and 1 carton of cigarettes per week  Essential hypertension - Continue Cardizem  CD - Temporarily holding benazepril  and hydrochlorothiazide   Mixed hyperlipidemia - Continue statin  COPD - Start bronchodilators    Advance Care Planning: FULL  Consults: GI  Family Communication: none  Severity of Illness: The appropriate patient status for this patient is OBSERVATION. Observation status is judged to be reasonable and necessary in order to provide the required intensity of service to ensure the patient's safety. The patient's presenting symptoms, physical exam findings, and initial radiographic and laboratory data in the context of their medical condition is felt to place them at decreased risk for further clinical deterioration. Furthermore, it is anticipated that the patient will be medically stable for discharge from the hospital within 2 midnights of admission.   Author: Demaris Fillers, MD 03/09/2024 10:16 AM  For on call review [email or web address].

## 2024-03-09 NOTE — ED Notes (Signed)
 Report given to crystal on 300

## 2024-03-09 NOTE — Plan of Care (Signed)

## 2024-03-10 ENCOUNTER — Encounter (HOSPITAL_COMMUNITY): Payer: Self-pay | Admitting: Internal Medicine

## 2024-03-10 ENCOUNTER — Encounter (HOSPITAL_COMMUNITY)
Admission: EM | Disposition: A | Discharge status: Home / Self Care | Payer: Self-pay | Source: Home / Self Care | Attending: Internal Medicine

## 2024-03-10 ENCOUNTER — Observation Stay (HOSPITAL_COMMUNITY): Admitting: Anesthesiology

## 2024-03-10 DIAGNOSIS — G2581 Restless legs syndrome: Secondary | ICD-10-CM | POA: Diagnosis present

## 2024-03-10 DIAGNOSIS — K269 Duodenal ulcer, unspecified as acute or chronic, without hemorrhage or perforation: Secondary | ICD-10-CM

## 2024-03-10 DIAGNOSIS — Z7951 Long term (current) use of inhaled steroids: Secondary | ICD-10-CM | POA: Diagnosis not present

## 2024-03-10 DIAGNOSIS — Z791 Long term (current) use of non-steroidal anti-inflammatories (NSAID): Secondary | ICD-10-CM | POA: Diagnosis not present

## 2024-03-10 DIAGNOSIS — K209 Esophagitis, unspecified without bleeding: Secondary | ICD-10-CM

## 2024-03-10 DIAGNOSIS — K2091 Esophagitis, unspecified with bleeding: Secondary | ICD-10-CM | POA: Diagnosis present

## 2024-03-10 DIAGNOSIS — K264 Chronic or unspecified duodenal ulcer with hemorrhage: Secondary | ICD-10-CM | POA: Diagnosis present

## 2024-03-10 DIAGNOSIS — D62 Acute posthemorrhagic anemia: Secondary | ICD-10-CM | POA: Diagnosis present

## 2024-03-10 DIAGNOSIS — K2970 Gastritis, unspecified, without bleeding: Secondary | ICD-10-CM | POA: Diagnosis present

## 2024-03-10 DIAGNOSIS — K274 Chronic or unspecified peptic ulcer, site unspecified, with hemorrhage: Secondary | ICD-10-CM

## 2024-03-10 DIAGNOSIS — F17290 Nicotine dependence, other tobacco product, uncomplicated: Secondary | ICD-10-CM | POA: Diagnosis present

## 2024-03-10 DIAGNOSIS — F17210 Nicotine dependence, cigarettes, uncomplicated: Secondary | ICD-10-CM

## 2024-03-10 DIAGNOSIS — I10 Essential (primary) hypertension: Secondary | ICD-10-CM | POA: Diagnosis present

## 2024-03-10 DIAGNOSIS — Z79899 Other long term (current) drug therapy: Secondary | ICD-10-CM | POA: Diagnosis not present

## 2024-03-10 DIAGNOSIS — E860 Dehydration: Secondary | ICD-10-CM | POA: Diagnosis present

## 2024-03-10 DIAGNOSIS — T39395A Adverse effect of other nonsteroidal anti-inflammatory drugs [NSAID], initial encounter: Secondary | ICD-10-CM | POA: Diagnosis present

## 2024-03-10 DIAGNOSIS — D649 Anemia, unspecified: Principal | ICD-10-CM

## 2024-03-10 DIAGNOSIS — J440 Chronic obstructive pulmonary disease with acute lower respiratory infection: Secondary | ICD-10-CM

## 2024-03-10 DIAGNOSIS — E782 Mixed hyperlipidemia: Secondary | ICD-10-CM | POA: Diagnosis present

## 2024-03-10 DIAGNOSIS — J4489 Other specified chronic obstructive pulmonary disease: Secondary | ICD-10-CM | POA: Diagnosis present

## 2024-03-10 DIAGNOSIS — Z886 Allergy status to analgesic agent status: Secondary | ICD-10-CM | POA: Diagnosis not present

## 2024-03-10 HISTORY — PX: ESOPHAGOGASTRODUODENOSCOPY: SHX5428

## 2024-03-10 LAB — CBC
HCT: 23.9 % — ABNORMAL LOW (ref 39.0–52.0)
Hemoglobin: 7.7 g/dL — ABNORMAL LOW (ref 13.0–17.0)
MCH: 30.4 pg (ref 26.0–34.0)
MCHC: 32.2 g/dL (ref 30.0–36.0)
MCV: 94.5 fL (ref 80.0–100.0)
Platelets: 277 10*3/uL (ref 150–400)
RBC: 2.53 MIL/uL — ABNORMAL LOW (ref 4.22–5.81)
RDW: 15.8 % — ABNORMAL HIGH (ref 11.5–15.5)
WBC: 11.2 10*3/uL — ABNORMAL HIGH (ref 4.0–10.5)
nRBC: 0 % (ref 0.0–0.2)

## 2024-03-10 LAB — BPAM RBC
Blood Product Expiration Date: 202506212359
Blood Product Expiration Date: 202506232359
ISSUE DATE / TIME: 202505280948
ISSUE DATE / TIME: 202505281956
Unit Type and Rh: 5100
Unit Type and Rh: 5100

## 2024-03-10 LAB — TYPE AND SCREEN
ABO/RH(D): O POS
Antibody Screen: NEGATIVE
Unit division: 0
Unit division: 0

## 2024-03-10 LAB — BASIC METABOLIC PANEL WITH GFR
Anion gap: 10 (ref 5–15)
BUN: 22 mg/dL (ref 8–23)
CO2: 22 mmol/L (ref 22–32)
Calcium: 8.6 mg/dL — ABNORMAL LOW (ref 8.9–10.3)
Chloride: 104 mmol/L (ref 98–111)
Creatinine, Ser: 1.07 mg/dL (ref 0.61–1.24)
GFR, Estimated: >60 mL/min (ref >60)
Glucose, Bld: 86 mg/dL (ref 70–99)
Potassium: 3.9 mmol/L (ref 3.5–5.1)
Sodium: 136 mmol/L (ref 135–145)

## 2024-03-10 LAB — HEMOGLOBIN AND HEMATOCRIT, BLOOD
HCT: 22.6 % — ABNORMAL LOW (ref 39.0–52.0)
Hemoglobin: 7.2 g/dL — ABNORMAL LOW (ref 13.0–17.0)

## 2024-03-10 SURGERY — EGD (ESOPHAGOGASTRODUODENOSCOPY)
Anesthesia: General

## 2024-03-10 MED ORDER — LIDOCAINE 2% (20 MG/ML) 5 ML SYRINGE
INTRAMUSCULAR | Status: DC | PRN
  Administered 2024-03-10: 100 mg via INTRAVENOUS

## 2024-03-10 MED ORDER — DILTIAZEM HCL ER COATED BEADS 120 MG PO CP24
120.0000 mg | ORAL_CAPSULE | Freq: Every day | ORAL | Status: DC
  Administered 2024-03-10 – 2024-03-12 (×3): 120 mg via ORAL
  Filled 2024-03-10 (×3): qty 1

## 2024-03-10 MED ORDER — ROPINIROLE HCL 1 MG PO TABS
1.0000 mg | ORAL_TABLET | Freq: Every day | ORAL | Status: DC
  Administered 2024-03-11: 1 mg via ORAL
  Filled 2024-03-10: qty 1

## 2024-03-10 MED ORDER — GLYCOPYRROLATE PF 0.2 MG/ML IJ SOSY
PREFILLED_SYRINGE | INTRAMUSCULAR | Status: DC | PRN
  Administered 2024-03-10: .2 mg via INTRAVENOUS

## 2024-03-10 MED ORDER — PROPOFOL 500 MG/50ML IV EMUL
INTRAVENOUS | Status: DC | PRN
  Administered 2024-03-10: 50 mg via INTRAVENOUS
  Administered 2024-03-10: 20 mg via INTRAVENOUS
  Administered 2024-03-10: 150 ug/kg/min via INTRAVENOUS
  Administered 2024-03-10: 20 mg via INTRAVENOUS

## 2024-03-10 MED ORDER — SODIUM CHLORIDE (PF) 0.9 % IJ SOLN
PREFILLED_SYRINGE | INTRAMUSCULAR | Status: DC | PRN
  Administered 2024-03-10: 2.5 mL

## 2024-03-10 MED ORDER — LACTATED RINGERS IV SOLN: INTRAVENOUS | Status: DC

## 2024-03-10 MED ORDER — LACTATED RINGERS IV SOLN: INTRAVENOUS | Status: DC | PRN

## 2024-03-10 MED ORDER — CYCLOBENZAPRINE HCL 10 MG PO TABS
10.0000 mg | ORAL_TABLET | Freq: Every day | ORAL | Status: DC
  Administered 2024-03-10 – 2024-03-11 (×2): 10 mg via ORAL
  Filled 2024-03-10 (×2): qty 1

## 2024-03-10 MED ORDER — SODIUM CHLORIDE 0.9 % IV SOLN: INTRAVENOUS | Status: DC

## 2024-03-10 NOTE — Interval H&P Note (Signed)
 History and Physical Interval Note:  03/10/2024 11:21 AM  David Callahan  has presented today for surgery, with the diagnosis of anemia, melena, coffee ground emesis.  The various methods of treatment have been discussed with the patient and family. After consideration of risks, benefits and other options for treatment, the patient has consented to  Procedure(s): EGD (ESOPHAGOGASTRODUODENOSCOPY) (N/A) as a surgical intervention.  The patient's history has been reviewed, patient examined, no change in status, stable for surgery.  I have reviewed the patient's chart and labs.  Questions were answered to the patient's satisfaction.    We will proceed with EGD  as scheduled.    I thoroughly discussed with the patient the procedure, including the risks involved. Patient understands what the procedure involves including the benefits and any risks. Patient understands alternatives to the proposed procedure. Risks including (but not limited to) bleeding, tearing of the lining (perforation), rupture of adjacent organs, problems with heart and lung function, infection, and medication reactions. A small percentage of complications may require surgery, hospitalization, repeat endoscopic procedure, and/or transfusion.  Patient understood and agreed.    [name]

## 2024-03-10 NOTE — Brief Op Note (Addendum)
 Patient underwent EGD with control of bleeding under propofol sedation.  Tolerated the procedure adequately.   FINDINGS:  - LA Grade A esophagitis with no bleeding. -   Gastritis.   - Non- obstructing large duodenal bulb ulcer with a nonbleeding visible vessel ( [place] Class IIa) . NSAID induced etiology. Injected with epinephrine in 4 quadrant . Treated with bipolar cautery ( Gold Probe)   - No specimens collected.  RECOMMENDATIONS  - Return patient to hospital ward for ongoing care.   - NPO.   - Continue present medications.   - continue IV PPI BID   - Trend HBG q12 hours   -Transfuse to keep HBG>7   -maintain 2 large bore IV lines  - If any significant bleeding is encountered obtain IR consult stat for possible GDA embolization   - Absolute NSAID cessation   - If stable can start clear liquid diet tomorrow morning   - IV fluids   - Consider repeat EGD in 2- 3 months after PPI to assess healing of this large lesion  Nikolay Demetriou Faizan Laquia Rosano, MD Gastroenterology and Hepatology [facility]

## 2024-03-10 NOTE — Anesthesia Preprocedure Evaluation (Signed)
 Anesthesia Evaluation  Patient identified by MRN, date of birth, ID band Patient awake    Reviewed: Allergy & Precautions, H&P , NPO status , Patient's Chart, lab work & pertinent test results, reviewed documented beta blocker date and time   Airway Mallampati: II  TM Distance: >3 FB Neck ROM: full    Dental no notable dental hx.    Pulmonary neg pulmonary ROS, asthma , sleep apnea , pneumonia, COPD, Current Smoker and Patient abstained from smoking.   Pulmonary exam normal breath sounds clear to auscultation       Cardiovascular Exercise Tolerance: Good hypertension, negative cardio ROS + Valvular Problems/Murmurs  Rhythm:regular Rate:Normal     Neuro/Psych negative neurological ROS  negative psych ROS   GI/Hepatic negative GI ROS, Neg liver ROS,,,  Endo/Other  negative endocrine ROS    Renal/GU Renal diseasenegative Renal ROS  negative genitourinary   Musculoskeletal   Abdominal   Peds  Hematology negative hematology ROS (+) Blood dyscrasia, anemia   Anesthesia Other Findings   Reproductive/Obstetrics negative OB ROS                             Anesthesia Physical Anesthesia Plan  ASA: 4 and emergent  Anesthesia Plan: General   Post-op Pain Management:    Induction:   PONV Risk Score and Plan: Propofol infusion  Airway Management Planned:   Additional Equipment:   Intra-op Plan:   Post-operative Plan:   Informed Consent: I have reviewed the patients History and Physical, chart, labs and discussed the procedure including the risks, benefits and alternatives for the proposed anesthesia with the patient or authorized representative who has indicated his/her understanding and acceptance.     Dental Advisory Given  Plan Discussed with: CRNA  Anesthesia Plan Comments:        Anesthesia Quick Evaluation

## 2024-03-10 NOTE — Transfer of Care (Signed)
 Immediate Anesthesia Transfer of Care Note  Patient: David Callahan  Procedure(s) Performed: EGD (ESOPHAGOGASTRODUODENOSCOPY)  Patient Location: PACU  Anesthesia Type:General  Level of Consciousness: awake, alert , and oriented  Airway & Oxygen  Therapy: Patient Spontanous Breathing  Post-op Assessment: Report given to RN and Post -op Vital signs reviewed and stable  Post vital signs: Reviewed and stable  Last Vitals:  Vitals Value Taken Time  BP 116/81   Temp 97.5   Pulse 85 03/10/24 1200  Resp 18 03/10/24 1200  SpO2 94 % 03/10/24 1200  Vitals shown include unfiled device data.  Last Pain:  Vitals:   03/10/24 1134  TempSrc:   PainSc: 10-Worst pain ever      Patients Stated Pain Goal: 6 (03/10/24 1014)  Complications: No notable events documented.

## 2024-03-10 NOTE — Progress Notes (Signed)
 PROGRESS NOTE  David Callahan:[id number] DOB: 1956/10/11 DOA: 03/09/2024 PCP: Jonah Negus, NP  Brief History:  68 year old male with a history of COPD, tobacco abuse, osteoarthritis, hypertension, restless leg syndrome presenting with generalized weakness for about a week.  The patient states that he has had some increased dyspnea on exertion during this period of time but denies any chest pain, coughing, hemoptysis.  He was contacted by his PCP about 2 weeks prior to this admission and told that he had a hemoglobin that was lower than usual in the low 7 range.  For the past week, the patient has been having intermittent melanotic stools.  He also 1 episode of emesis with coffee grounds. He denies any fevers, chills, headache, dizziness.  He does have some upper abdominal pain. Notably, the patient states that he has been taking up to 10 Aleve  every other day.  He denies any alcohol use.  He denies any dysuria, hematuria. In the ED, the patient was afebrile hemodynamically stable with oxygen  saturation 100% room air.  WBC 10.6, hemoglobin 6.7, platelets 480.  Sodium 136, potassium 3.6, bicarbonate 23, serum creatinine 1.26.  Troponin 8>> 7.  EKG showed sinus rhythm nonspecific ST changes.  Chest x-ray showed hyperinflation and chronic interstitial markings.  GI was consulted to assist with management.  He was transfused 1 unit PRBC.   Assessment/Plan: Melena/acute blood loss anemia -Presented with hemoglobin 6.7 - Baseline hemoglobin 8-9 - Concerned about peptic ulcer disease in the setting of chronic NSAID use - GI consult appreciated - Transfused 2 unit PRBC this admission - continue pantoprazole bid -5/29 EGD--large duodenal bulb ulcer with nonbleeding visible vessel injected with epi and cauterized - remain npo per GI   Tobacco abuse - Tobacco cessation discussed - He smokes cigars, and 1 carton of cigarettes per week   Essential hypertension - Continue Cardizem  CD  lower dose - Temporarily holding benazepril  and hydrochlorothiazide    Mixed hyperlipidemia - Continue statin   COPD - continue bronchodilators        Family Communication:   spouse at bedside 5/29  Consultants:  GI  Code Status:  FULL   DVT Prophylaxis:  SCDs   Procedures: As Listed in Progress Note Above  Antibiotics: None      Subjective: Patient denies fevers, chills, headache, chest pain, dyspnea, nausea, vomiting, diarrhea, abdominal pain, dysuria, hematuria, hematochezia, and melena.   Objective: Vitals:   03/10/24 1014 03/10/24 1156 03/10/24 1200 03/10/24 1215  BP: 137/73 126/74 116/81 113/80  Pulse: 64 82 82 70  Resp: (!) 9 16 14 20   Temp: 98.1 F (36.7 C) 97.8 F (36.6 C)    TempSrc: Oral     SpO2: 100% 100% (!) 85% 99%  Weight:      Height:        Intake/Output Summary (Last 24 hours) at 03/10/2024 1651 Last data filed at 03/10/2024 1151 Gross per 24 hour  Intake 1291 ml  Output --  Net 1291 ml   Weight change:  Exam:  General:  Pt is alert, follows commands appropriately, not in acute distress HEENT: No icterus, No thrush, No neck mass, [place]/AT Cardiovascular: RRR, S1/S2, no rubs, no gallops Respiratory: diminished BS but CTA Abdomen: Soft/+BS, non tender, non distended, no guarding Extremities: No edema, No lymphangitis, No petechiae, No rashes, no synovitis   Data Reviewed: I have personally reviewed following labs and imaging studies Basic Metabolic Panel: Recent Labs  Lab 03/09/24  0801 03/10/24 0426  NA 136 136  K 3.6 3.9  CL 103 104  CO2 23 22  GLUCOSE 112* 86  BUN 29* 22  CREATININE 1.26* 1.07  CALCIUM  9.1 8.6*   Liver Function Tests: Recent Labs  Lab 03/09/24 0801  AST 14*  ALT 7  ALKPHOS 111  BILITOT 0.3  PROT 6.4*  ALBUMIN 3.1*   Recent Labs  Lab 03/09/24 0801  LIPASE 28   No results for input(s): "AMMONIA" in the last 168 hours. Coagulation Profile: No results for input(s): "INR", "PROTIME" in the  last 168 hours. CBC: Recent Labs  Lab 03/09/24 0220 03/09/24 0801 03/09/24 2355 03/10/24 0426  WBC  --  10.6*  --  11.2*  NEUTROABS  --  8.6*  --   --   HGB 6.9* 6.7* 7.2* 7.7*  HCT 21.3* 21.2* 22.6* 23.9*  MCV  --  93.8  --  94.5  PLT  --  480*  --  277   Cardiac Enzymes: No results for input(s): "CKTOTAL", "CKMB", "CKMBINDEX", "TROPONINI" in the last 168 hours. BNP: Invalid input(s): "POCBNP" CBG: No results for input(s): "GLUCAP" in the last 168 hours. HbA1C: No results for input(s): "HGBA1C" in the last 72 hours. Urine analysis:    Component Value Date/Time   COLORURINE YELLOW 03/09/2024 1520   APPEARANCEUR CLEAR 03/09/2024 1520   LABSPEC 1.021 03/09/2024 1520   PHURINE 5.0 03/09/2024 1520   GLUCOSEU NEGATIVE 03/09/2024 1520   HGBUR NEGATIVE 03/09/2024 1520   BILIRUBINUR NEGATIVE 03/09/2024 1520   KETONESUR NEGATIVE 03/09/2024 1520   PROTEINUR 30 (A) 03/09/2024 1520   NITRITE NEGATIVE 03/09/2024 1520   LEUKOCYTESUR NEGATIVE 03/09/2024 1520   Sepsis Labs: @LABRCNTIP (procalcitonin:4,lacticidven:4) )No results found for this or any previous visit (from the past 240 hours).   Scheduled Meds:  atorvastatin   40 mg Oral Daily   cyclobenzaprine   10 mg Oral QHS   diltiazem   120 mg Oral Daily   feeding supplement  1 Container Oral TID BM   ipratropium-albuterol   3 mL Nebulization BID   nicotine   21 mg Transdermal Daily   pantoprazole (PROTONIX) IV  40 mg Intravenous Q12H   [START ON 03/11/2024] rOPINIRole   1 mg Oral QHS   Continuous Infusions:  Procedures/Studies: DG Chest Port 1 View Result Date: 03/09/2024 CLINICAL DATA:  68 year old male with weakness. EXAM: PORTABLE CHEST 1 VIEW COMPARISON:  Chest radiographs 07/01/2022 and earlier. FINDINGS: Portable AP upright view at 0735 hours. Lungs appear somewhat hyperinflated, with improved left lung size and ventilation since 2023. Mediastinal contours are within normal limits. Visualized tracheal air column is within  normal limits. Allowing for portable technique the lungs are clear. No pneumothorax or pleural effusion. Negative visible bowel gas. No acute osseous abnormality identified. Chronic severe right glenohumeral degeneration. IMPRESSION: No acute cardiopulmonary abnormality. Electronically Signed   By: Marlise Simpers M.D.   On: 03/09/2024 07:54    Demaris Fillers, DO  [facility]  If 7PM-7AM, please contact night-coverage [email or web address] Password [facility] 03/10/2024, 4:51 PM   LOS: 0 days

## 2024-03-10 NOTE — Op Note (Signed)
 [facility] Patient Name: David Callahan Procedure Date: 03/10/2024 11:26 AM MRN: [id number] Date of Birth: Mar 21, 1956 Attending MD: Terril Fetters , MD, [id number] CSN: [id number] Age: 68 Admit Type: Inpatient Procedure:                Upper GI endoscopy Indications:              Acute post hemorrhagic anemia, Hematemesis Providers:                Terril Fetters, MD, Crystal Page, Graydon Lazier RN,                            RN, Annell Barrow Referring MD:              Medicines:                Monitored Anesthesia Care Complications:            No immediate complications. Estimated Blood Loss:     Estimated blood loss was minimal. Procedure:                Pre-Anesthesia Assessment:                           - Prior to the procedure, a History and Physical                            was performed, and patient medications and                            allergies were reviewed. The patient's tolerance of                            previous anesthesia was also reviewed. The risks                            and benefits of the procedure and the sedation                            options and risks were discussed with the patient.                            All questions were answered, and informed consent                            was obtained. Prior Anticoagulants: The patient has                            taken no anticoagulant or antiplatelet agents                            except for NSAID medication. ASA Grade Assessment:                            III - A patient with severe systemic disease. After  reviewing the risks and benefits, the patient was                            deemed in satisfactory condition to undergo the                            procedure.                           After obtaining informed consent, the endoscope was                            passed under direct vision. Throughout the                            procedure, the  patient's blood pressure, pulse, and                            oxygen  saturations were monitored continuously. The                            [id number]-H190 ([id number]) scope was introduced through the                            mouth, and advanced to the second part of duodenum.                            The upper GI endoscopy was accomplished without                            difficulty. The patient tolerated the procedure                            well. Scope In: 11:41:57 AM Scope Out: 11:52:08 AM Total Procedure Duration: 0 hours 10 minutes 11 seconds  Findings:      LA Grade A (one or more mucosal breaks less than 5 mm, not extending       between tops of 2 mucosal folds) esophagitis with no bleeding was found       in the lower third of the esophagus.      Moderate inflammation characterized by erythema was found in the gastric       antrum.      One non-obstructing non-bleeding cratered duodenal ulcer with a       nonbleeding visible vessel (Forrest Class IIa) was found in the duodenal       bulb. The lesion was 30 mm in largest dimension. Area was successfully       injected with 2 mL of a 0.1 mg/mL solution of epinephrine  for       hemostasis. Coagulation for hemostasis using bipolar probe was       successful. Impression:               - LA Grade A esophagitis with no bleeding.                           - Gastritis.                           -  Non-obstructing large duodenal bulb ulcer with a                            nonbleeding visible vessel (Forrest Class IIa).                            NSAID induced etiology. Injected with epinephrine                             in 4 quadrant . Treated with bipolar cautery ( Gold                            Probe)                           - No specimens collected. Moderate Sedation:      Per Anesthesia Care Recommendation:           - Return patient to hospital ward for ongoing care.                           - NPO.                            - Continue present medications.                           -continue IV PPI BID                           - Trend HBG q12 hours                           - If any significant bleeding is encountered obtain                            IR consult stat for possible GDA embolization                           -Absolute NSAID cessation                           -If stable can start clear liquid diet tomorrow                            morning                           -IV fluids                           -Consider repeat EGD in 2-3 months after PPI to                            assess healing of this large lesion Procedure Code(s):        --- Professional ---  43255, Esophagogastroduodenoscopy, flexible,                            transoral; with control of bleeding, any method Diagnosis Code(s):        --- Professional ---                           K20.90, Esophagitis, unspecified without bleeding                           K29.70, Gastritis, unspecified, without bleeding                           T39.395S, Adverse effect of other nonsteroidal                            anti-inflammatory drugs [NSAID], sequela                           K26.4, Chronic or unspecified duodenal ulcer with                            hemorrhage                           D62, Acute posthemorrhagic anemia                           K92.0, Hematemesis CPT copyright 2022 [employer]. All rights reserved. The codes documented in this report are preliminary and upon coder review may  be revised to meet current compliance requirements. Terril Fetters, MD Terril Fetters, MD 03/10/2024 12:04:54 PM This report has been signed electronically. Number of Addenda: 0

## 2024-03-11 ENCOUNTER — Encounter (HOSPITAL_COMMUNITY): Payer: Self-pay | Admitting: Gastroenterology

## 2024-03-11 ENCOUNTER — Telehealth: Payer: Self-pay | Admitting: Gastroenterology

## 2024-03-11 DIAGNOSIS — K269 Duodenal ulcer, unspecified as acute or chronic, without hemorrhage or perforation: Secondary | ICD-10-CM

## 2024-03-11 DIAGNOSIS — K209 Esophagitis, unspecified without bleeding: Secondary | ICD-10-CM | POA: Diagnosis not present

## 2024-03-11 DIAGNOSIS — D62 Acute posthemorrhagic anemia: Secondary | ICD-10-CM | POA: Diagnosis not present

## 2024-03-11 DIAGNOSIS — D649 Anemia, unspecified: Secondary | ICD-10-CM | POA: Diagnosis not present

## 2024-03-11 DIAGNOSIS — K2970 Gastritis, unspecified, without bleeding: Secondary | ICD-10-CM | POA: Diagnosis not present

## 2024-03-11 DIAGNOSIS — K274 Chronic or unspecified peptic ulcer, site unspecified, with hemorrhage: Secondary | ICD-10-CM | POA: Diagnosis not present

## 2024-03-11 LAB — CBC
HCT: 23.2 % — ABNORMAL LOW (ref 39.0–52.0)
Hemoglobin: 7.5 g/dL — ABNORMAL LOW (ref 13.0–17.0)
MCH: 30 pg (ref 26.0–34.0)
MCHC: 32.3 g/dL (ref 30.0–36.0)
MCV: 92.8 fL (ref 80.0–100.0)
Platelets: 442 10*3/uL — ABNORMAL HIGH (ref 150–400)
RBC: 2.5 MIL/uL — ABNORMAL LOW (ref 4.22–5.81)
RDW: 15.5 % (ref 11.5–15.5)
WBC: 8.5 10*3/uL (ref 4.0–10.5)
nRBC: 0 % (ref 0.0–0.2)

## 2024-03-11 NOTE — Progress Notes (Signed)
 Gastroenterology Progress Note   Referring Provider: No ref. provider found Primary Care Physician:  Jonah Negus, NP Primary Gastroenterologist: Urban Garden, MD   Patient ID: David Callahan; [id number]; 03-01-1956    Subjective   Patient reports he feels great today.  He states he feels the best today than he has in the last 2-3 months.  Denies any abdominal pain, nausea, or vomiting.  Had small stool this morning reports it was a grayish, color but was very small amount.  Has not had a ton of intake over the last 1-2 weeks, mostly a couple things of soup and crackers.  Did report frequent Advil/Aleve  use previously.  Walked around the unit this morning without issue and minimal assistance.  Tolerated clear liquid diet this morning, did well again at lunchtime.  Objective   Vital signs in last 24 hours Temp:  [97.7 F (36.5 C)-98.6 F (37 C)] 97.7 F (36.5 C) (05/30 1340) Pulse Rate:  [68-74] 74 (05/30 1340) Resp:  [16-17] 16 (05/30 1340) BP: (127-148)/(89-93) 141/90 (05/30 1340) SpO2:  [87 %-100 %] 100 % (05/30 1340) Last BM Date : 03/03/24  Physical Exam General:   Alert and oriented, pleasant Head:  Normocephalic and atraumatic. Eyes:  No icterus, sclera clear. Conjuctiva pink.  Mouth:  Missing teeth.  Neck:  Supple, without thyromegaly or masses.  Abdomen:  Bowel sounds present, soft, non-tender, non-distended. No HSM or hernias noted. No rebound or guarding. No masses appreciated  Neurologic:  Alert and  oriented x4;  grossly normal neurologically. Skin:  Warm and dry, intact without significant lesions.  Psych:  Alert and cooperative. Normal mood and affect.  Intake/Output from previous day: 05/29 0701 - 05/30 0700 In: 150 [I.V.:150] Out: -  Intake/Output this shift: Total I/O In: 480 [P.O.:480] Out: -   Lab Results  Recent Labs    03/09/24 0801 03/09/24 2355 03/10/24 0426 03/11/24 0425  WBC 10.6*  --  11.2* 8.5  HGB 6.7* 7.2* 7.7*  7.5*  HCT 21.2* 22.6* 23.9* 23.2*  PLT 480*  --  277 442*   BMET Recent Labs    03/09/24 0801 03/10/24 0426  NA 136 136  K 3.6 3.9  CL 103 104  CO2 23 22  GLUCOSE 112* 86  BUN 29* 22  CREATININE 1.26* 1.07  CALCIUM  9.1 8.6*   LFT Recent Labs    03/09/24 0801  PROT 6.4*  ALBUMIN 3.1*  AST 14*  ALT 7  ALKPHOS 111  BILITOT 0.3   PT/INR No results for input(s): "LABPROT", "INR" in the last 72 hours. Hepatitis Panel No results for input(s): "HEPBSAG", "HCVAB", "HEPAIGM", "HEPBIGM" in the last 72 hours.   Studies/Results DG Chest Port 1 View Result Date: 03/09/2024 CLINICAL DATA:  68 year old male with weakness. EXAM: PORTABLE CHEST 1 VIEW COMPARISON:  Chest radiographs 07/01/2022 and earlier. FINDINGS: Portable AP upright view at 0735 hours. Lungs appear somewhat hyperinflated, with improved left lung size and ventilation since 2023. Mediastinal contours are within normal limits. Visualized tracheal air column is within normal limits. Allowing for portable technique the lungs are clear. No pneumothorax or pleural effusion. Negative visible bowel gas. No acute osseous abnormality identified. Chronic severe right glenohumeral degeneration. IMPRESSION: No acute cardiopulmonary abnormality. Electronically Signed   By: Marlise Simpers M.D.   On: 03/09/2024 07:54    Assessment  68 y.o. male with a history of asthma, COPD, HTN, and HLD who presented to the ED with generalized weakness, anorexia, fatigue, melena for 1 week.  GI consulted for further evaluation of symptomatic anemia.  Symptomatic anemia, N/V, melena:  - Hgb 6.7 on arrival with prior outpatient labs with hemoglobin around 7.   S/p 2 unit PRBCs this admission. BUN elevated - Colonoscopy up-to-date as of March 2025 completed in [place]  and was reported normal by patient - Admitted to frequent Aleve  use as of late. - Presented with epigastric pain, melena, and possible black emesis for about a week - EGD yesterday  5/29 with LA grade a esophagitis without bleeding, gastritis, and nonobstructing large duodenal bulb ulcer with nonbleeding visible vessel s/p injection with epinephrine and bipolar cautery - Doing well today with stable hemoglobin at 7.5 - Diet advanced this morning to clear liquids given hemodynamically stable and no emesis overnight, check back at lunch and was still doing well.  Will consider advancement of diet later this afternoon (soft)  Plan / Recommendations  PPI IV BID - transition to p.o on discharge.  Clear liquid diet today, may advance to soft later this afternoon if tolerating and no recurrent nausea, vomiting, or abdominal pain Monitor for overt bleeding, recommend IR consult stat if this occurs Complete cessation of NSAIDs Discussed need for repeat upper endoscopy in 2-3 months outpatient.  Our office will arrange follow up.  CBC 1 week post discharge  Goal hemoglobin > 7, if hemoglobin remains stable tomorrow morning and tolerating diet would be okay for discharge from GI standpoint.     LOS: 1 day   03/11/2024, 2:52 PM   Julian Obey, MSN, FNP-BC, AGACNP-BC [facility]

## 2024-03-11 NOTE — Progress Notes (Signed)
 Mobility Specialist Progress Note:    03/11/24 1045  Mobility  Activity Ambulated with assistance in hallway  Level of Assistance Independent  Assistive Device None  Distance Ambulated (ft) 160 ft  Range of Motion/Exercises Active;All extremities  Activity Response Tolerated well  Mobility Referral Yes  Mobility visit 1 Mobility  Mobility Specialist Start Time (ACUTE ONLY) 1045  Mobility Specialist Stop Time (ACUTE ONLY) 1104  Mobility Specialist Time Calculation (min) (ACUTE ONLY) 19 min   Pt received in chair, agreeable to mobility. Independently able to stand and ambulate with no AD. Tolerated well,asx throughout. Returned pt to chair, all needs met.  Semiyah Newgent Mobility Specialist Please contact via Special educational needs teacher or  Rehab office at [phone]

## 2024-03-11 NOTE — Progress Notes (Signed)
 PROGRESS NOTE  David Callahan VPX:[id number] DOB: Dec 04, 1955 DOA: 03/09/2024 PCP: Jonah Negus, NP  Brief History:  68 year old male with a history of COPD, tobacco abuse, osteoarthritis, hypertension, restless leg syndrome presenting with generalized weakness for about a week.  The patient states that he has had some increased dyspnea on exertion during this period of time but denies any chest pain, coughing, hemoptysis.  He was contacted by his PCP about 2 weeks prior to this admission and told that he had a hemoglobin that was lower than usual in the low 7 range.  For the past week, the patient has been having intermittent melanotic stools.  He also 1 episode of emesis with coffee grounds. He denies any fevers, chills, headache, dizziness.  He does have some upper abdominal pain. Notably, the patient states that he has been taking up to 10 Aleve  every other day.  He denies any alcohol use.  He denies any dysuria, hematuria. In the ED, the patient was afebrile hemodynamically stable with oxygen  saturation 100% room air.  WBC 10.6, hemoglobin 6.7, platelets 480.  Sodium 136, potassium 3.6, bicarbonate 23, serum creatinine 1.26.  Troponin 8>> 7.  EKG showed sinus rhythm nonspecific ST changes.  Chest x-ray showed hyperinflation and chronic interstitial markings.  GI was consulted to assist with management.  He was transfused 1 unit PRBC.   Assessment/Plan: Melena/acute blood loss anemia -Presented with hemoglobin 6.7 - Baseline hemoglobin 8-9 - Concerned about peptic ulcer disease in the setting of chronic NSAID use - GI consult appreciated - Transfused 2 unit PRBC this admission - continue pantoprazole bid -5/29 EGD--large duodenal bulb ulcer with nonbleeding visible vessel injected with epi and cauterized - 5/30 started on clears>>advance to soft diet as pt does not have any n/v, abd pain   Tobacco abuse - Tobacco cessation discussed - He smokes cigars, and 1 carton of  cigarettes per week   Essential hypertension - Continue Cardizem  CD lower dose - Temporarily holding benazepril  and hydrochlorothiazide    Mixed hyperlipidemia - Continue statin   COPD - continue bronchodilators             Family Communication:   spouse at bedside 5/29   Consultants:  GI   Code Status:  FULL    DVT Prophylaxis:  SCDs     Procedures: As Listed in Progress Note Above   Antibiotics: None            Subjective: Patient denies fevers, chills, headache, chest pain, dyspnea, nausea, vomiting, diarrhea, abdominal pain, dysuria, hematuria, hematochezia, and melena.   Objective: Vitals:   03/11/24 0617 03/11/24 0804 03/11/24 0901 03/11/24 1340  BP: (!) 143/93  127/89 (!) 141/90  Pulse: 68   74  Resp: 17   16  Temp: 98.6 F (37 C)   97.7 F (36.5 C)  TempSrc: Oral   Oral  SpO2: (!) 87% 95%  100%  Weight:      Height:        Intake/Output Summary (Last 24 hours) at 03/11/2024 1619 Last data filed at 03/11/2024 1230 Gross per 24 hour  Intake 480 ml  Output --  Net 480 ml   Weight change:  Exam:  General:  Pt is alert, follows commands appropriately, not in acute distress HEENT: No icterus, No thrush, No neck mass, [place]/AT Cardiovascular: RRR, S1/S2, no rubs, no gallops Respiratory: diminished BS, but CTA bilaterally, no wheezing, no crackles, no rhonchi Abdomen: Soft/+BS, non  tender, non distended, no guarding Extremities: No edema, No lymphangitis, No petechiae, No rashes, no synovitis   Data Reviewed: I have personally reviewed following labs and imaging studies Basic Metabolic Panel: Recent Labs  Lab 03/09/24 0801 03/10/24 0426  NA 136 136  K 3.6 3.9  CL 103 104  CO2 23 22  GLUCOSE 112* 86  BUN 29* 22  CREATININE 1.26* 1.07  CALCIUM  9.1 8.6*   Liver Function Tests: Recent Labs  Lab 03/09/24 0801  AST 14*  ALT 7  ALKPHOS 111  BILITOT 0.3  PROT 6.4*  ALBUMIN 3.1*   Recent Labs  Lab 03/09/24 0801  LIPASE 28    No results for input(s): "AMMONIA" in the last 168 hours. Coagulation Profile: No results for input(s): "INR", "PROTIME" in the last 168 hours. CBC: Recent Labs  Lab 03/09/24 0220 03/09/24 0801 03/09/24 2355 03/10/24 0426 03/11/24 0425  WBC  --  10.6*  --  11.2* 8.5  NEUTROABS  --  8.6*  --   --   --   HGB 6.9* 6.7* 7.2* 7.7* 7.5*  HCT 21.3* 21.2* 22.6* 23.9* 23.2*  MCV  --  93.8  --  94.5 92.8  PLT  --  480*  --  277 442*   Cardiac Enzymes: No results for input(s): "CKTOTAL", "CKMB", "CKMBINDEX", "TROPONINI" in the last 168 hours. BNP: Invalid input(s): "POCBNP" CBG: No results for input(s): "GLUCAP" in the last 168 hours. HbA1C: No results for input(s): "HGBA1C" in the last 72 hours. Urine analysis:    Component Value Date/Time   COLORURINE YELLOW 03/09/2024 1520   APPEARANCEUR CLEAR 03/09/2024 1520   LABSPEC 1.021 03/09/2024 1520   PHURINE 5.0 03/09/2024 1520   GLUCOSEU NEGATIVE 03/09/2024 1520   HGBUR NEGATIVE 03/09/2024 1520   BILIRUBINUR NEGATIVE 03/09/2024 1520   KETONESUR NEGATIVE 03/09/2024 1520   PROTEINUR 30 (A) 03/09/2024 1520   NITRITE NEGATIVE 03/09/2024 1520   LEUKOCYTESUR NEGATIVE 03/09/2024 1520   Sepsis Labs: @LABRCNTIP (procalcitonin:4,lacticidven:4) )No results found for this or any previous visit (from the past 240 hours).   Scheduled Meds:  atorvastatin   40 mg Oral Daily   cyclobenzaprine   10 mg Oral QHS   diltiazem   120 mg Oral Daily   feeding supplement  1 Container Oral TID BM   ipratropium-albuterol   3 mL Nebulization BID   nicotine   21 mg Transdermal Daily   pantoprazole (PROTONIX) IV  40 mg Intravenous Q12H   rOPINIRole   1 mg Oral QHS   Continuous Infusions:  Procedures/Studies: DG Chest Port 1 View Result Date: 03/09/2024 CLINICAL DATA:  68 year old male with weakness. EXAM: PORTABLE CHEST 1 VIEW COMPARISON:  Chest radiographs 07/01/2022 and earlier. FINDINGS: Portable AP upright view at 0735 hours. Lungs appear somewhat  hyperinflated, with improved left lung size and ventilation since 2023. Mediastinal contours are within normal limits. Visualized tracheal air column is within normal limits. Allowing for portable technique the lungs are clear. No pneumothorax or pleural effusion. Negative visible bowel gas. No acute osseous abnormality identified. Chronic severe right glenohumeral degeneration. IMPRESSION: No acute cardiopulmonary abnormality. Electronically Signed   By: Marlise Simpers M.D.   On: 03/09/2024 07:54    Demaris Fillers, DO  [facility]  If 7PM-7AM, please contact night-coverage [email or web address] Password [email or web address] 03/11/2024, 4:19 PM   LOS: 1 day

## 2024-03-11 NOTE — Plan of Care (Signed)

## 2024-03-11 NOTE — Telephone Encounter (Signed)
 David Callahan -please arrange hospital follow-up with me or Chelsea in about 3-4 weeks to discuss follow up EGD, if no availability can be with Dr. Sammi Crick.  David Callahan -please arrange CBC outpatient for patient in 1 week.  David Obey, MSN, APRN, FNP-BC, AGACNP-BC [facility] at [facility]

## 2024-03-11 NOTE — Care Management Important Message (Signed)
 Important Message  Patient Details  Name: David Callahan MRN: [id number] Date of Birth: 12-15-1955   Important Message Given:  Yes - Medicare IM     Rhoderick Farrel L Deandre Brannan 03/11/2024, 11:45 AM

## 2024-03-12 ENCOUNTER — Telehealth (INDEPENDENT_AMBULATORY_CARE_PROVIDER_SITE_OTHER): Payer: Self-pay | Admitting: Gastroenterology

## 2024-03-12 DIAGNOSIS — D649 Anemia, unspecified: Secondary | ICD-10-CM | POA: Diagnosis not present

## 2024-03-12 DIAGNOSIS — K209 Esophagitis, unspecified without bleeding: Secondary | ICD-10-CM | POA: Diagnosis not present

## 2024-03-12 DIAGNOSIS — K269 Duodenal ulcer, unspecified as acute or chronic, without hemorrhage or perforation: Secondary | ICD-10-CM | POA: Diagnosis not present

## 2024-03-12 LAB — CBC
HCT: 23.2 % — ABNORMAL LOW (ref 39.0–52.0)
Hemoglobin: 7.5 g/dL — ABNORMAL LOW (ref 13.0–17.0)
MCH: 30.4 pg (ref 26.0–34.0)
MCHC: 32.3 g/dL (ref 30.0–36.0)
MCV: 93.9 fL (ref 80.0–100.0)
Platelets: 442 10*3/uL — ABNORMAL HIGH (ref 150–400)
RBC: 2.47 MIL/uL — ABNORMAL LOW (ref 4.22–5.81)
RDW: 15.3 % (ref 11.5–15.5)
WBC: 8.8 10*3/uL (ref 4.0–10.5)
nRBC: 0 % (ref 0.0–0.2)

## 2024-03-12 LAB — BASIC METABOLIC PANEL WITH GFR
Anion gap: 7 (ref 5–15)
BUN: 18 mg/dL (ref 8–23)
CO2: 25 mmol/L (ref 22–32)
Calcium: 8.5 mg/dL — ABNORMAL LOW (ref 8.9–10.3)
Chloride: 107 mmol/L (ref 98–111)
Creatinine, Ser: 1.18 mg/dL (ref 0.61–1.24)
GFR, Estimated: >60 mL/min (ref >60)
Glucose, Bld: 99 mg/dL (ref 70–99)
Potassium: 4 mmol/L (ref 3.5–5.1)
Sodium: 139 mmol/L (ref 135–145)

## 2024-03-12 MED ORDER — PANTOPRAZOLE SODIUM 40 MG PO TBEC: 40.0000 mg | DELAYED_RELEASE_TABLET | Freq: Two times a day (BID) | ORAL | Status: DC

## 2024-03-12 MED ORDER — ENSURE COMPLETE PO LIQD: 237.0000 mL | Freq: Two times a day (BID) | ORAL | 1 refills | Status: AC

## 2024-03-12 MED ORDER — PANTOPRAZOLE SODIUM 40 MG PO TBEC: 40.0000 mg | DELAYED_RELEASE_TABLET | Freq: Two times a day (BID) | ORAL | 1 refills | Status: DC

## 2024-03-12 NOTE — Plan of Care (Signed)
   Problem: Education: Goal: Knowledge of General Education information will improve Description: Including pain rating scale, medication(s)/side effects and non-pharmacologic comfort measures Outcome: Progressing   Problem: Health Behavior/Discharge Planning: Goal: Ability to manage health-related needs will improve Outcome: Progressing   Problem: Clinical Measurements: Goal: Will remain free from infection Outcome: Progressing

## 2024-03-12 NOTE — Telephone Encounter (Signed)
 Opened by error.

## 2024-03-12 NOTE — Progress Notes (Signed)
 Samantha Cress, M.D. Gastroenterology & Hepatology   Interval History:  Patient reports feeling well, denies any nausea, vomiting, fever, chills, abdominal pain, abdominal distention, melena or hematochezia.  Was able to tolerate oral feeds. Hemoglobin remains stable at 7.5.  Inpatient Medications:  Current Facility-Administered Medications:    acetaminophen  (TYLENOL ) tablet 650 mg, 650 mg, Oral, Q6H PRN **OR** acetaminophen  (TYLENOL ) suppository 650 mg, 650 mg, Rectal, Q6H PRN, Tat, David, MD   atorvastatin  (LIPITOR) tablet 40 mg, 40 mg, Oral, Daily, Tat, David, MD, 40 mg at 03/11/24 0901   cyclobenzaprine  (FLEXERIL ) tablet 10 mg, 10 mg, Oral, QHS, Tat, Myrtie Atkinson, MD, 10 mg at 03/11/24 2234   diltiazem  (CARDIZEM  CD) 24 hr capsule 120 mg, 120 mg, Oral, Daily, Tat, David, MD, 120 mg at 03/11/24 0901   feeding supplement (BOOST / RESOURCE BREEZE) liquid 1 Container, 1 Container, Oral, TID BM, Tat, David, MD, 1 Container at 03/11/24 1923   HYDROcodone -acetaminophen  (NORCO) 10-325 MG per tablet 1 tablet, 1 tablet, Oral, Q4H PRN, Tat, Myrtie Atkinson, MD, 1 tablet at 03/12/24 4696   ipratropium-albuterol  (DUONEB) 0.5-2.5 (3) MG/3ML nebulizer solution 3 mL, 3 mL, Nebulization, BID, Tat, David, MD, 3 mL at 03/11/24 1950   nicotine  (NICODERM CQ  - dosed in mg/24 hours) patch 21 mg, 21 mg, Transdermal, Daily, Tat, David, MD, 21 mg at 03/11/24 2952   ondansetron  (ZOFRAN ) tablet 4 mg, 4 mg, Oral, Q6H PRN **OR** ondansetron  (ZOFRAN ) injection 4 mg, 4 mg, Intravenous, Q6H PRN, Tat, David, MD   pantoprazole  (PROTONIX ) injection 40 mg, 40 mg, Intravenous, Q12H, Tat, Myrtie Atkinson, MD, 40 mg at 03/11/24 2234   rOPINIRole  (REQUIP ) tablet 1 mg, 1 mg, Oral, QHS, Tat, Myrtie Atkinson, MD, 1 mg at 03/11/24 2234   traZODone  (DESYREL ) tablet 150 mg, 150 mg, Oral, QHS PRN, Tat, Myrtie Atkinson, MD, 150 mg at 03/11/24 2247   I/O    Intake/Output Summary (Last 24 hours) at 03/12/2024 0734 Last data filed at 03/12/2024 0559 Gross per 24 hour  Intake 720 ml   Output --  Net 720 ml     Physical Exam: Temp:  [97.7 F (36.5 C)-98.3 F (36.8 C)] 97.7 F (36.5 C) (05/31 0545) Pulse Rate:  [74-78] 76 (05/31 0545) Resp:  [16-17] 17 (05/31 0545) BP: (127-157)/(67-90) 136/67 (05/31 0545) SpO2:  [95 %-100 %] 100 % (05/31 0545)  Temp (24hrs), Avg:97.9 F (36.6 C), Min:97.7 F (36.5 C), Max:98.3 F (36.8 C)  GENERAL: The patient is AO x3, in no acute distress. HEENT: Head is normocephalic and atraumatic. EOMI are intact. Mouth is well hydrated and without lesions. NECK: Supple. No masses LUNGS: Clear to auscultation. No presence of rhonchi/wheezing/rales. Adequate chest expansion HEART: RRR, normal s1 and s2. ABDOMEN: Soft, nontender, no guarding, no peritoneal signs, and nondistended. BS +. No masses. EXTREMITIES: Without any cyanosis, clubbing, rash, lesions or edema. NEUROLOGIC: AOx3, no focal motor deficit. SKIN: no jaundice, no rashes  Laboratory Data: CBC:     Component Value Date/Time   WBC 8.8 03/12/2024 0252   RBC 2.47 (L) 03/12/2024 0252   HGB 7.5 (L) 03/12/2024 0252   HCT 23.2 (L) 03/12/2024 0252   PLT 442 (H) 03/12/2024 0252   MCV 93.9 03/12/2024 0252   MCH 30.4 03/12/2024 0252   MCHC 32.3 03/12/2024 0252   RDW 15.3 03/12/2024 0252   LYMPHSABS 1.2 03/09/2024 0801   MONOABS 0.8 03/09/2024 0801   EOSABS 0.0 03/09/2024 0801   BASOSABS 0.0 03/09/2024 0801   COAG:  Lab Results  Component Value Date   INR  1.4 (H) 06/04/2022    BMP:     Latest Ref Rng & Units 03/12/2024    2:52 AM 03/10/2024    4:26 AM 03/09/2024    8:01 AM  BMP  Glucose 70 - 99 mg/dL 99  86  782   BUN 8 - 23 mg/dL 18  22  29    Creatinine 0.61 - 1.24 mg/dL 9.56  2.13  0.86   Sodium 135 - 145 mmol/L 139  136  136   Potassium 3.5 - 5.1 mmol/L 4.0  3.9  3.6   Chloride 98 - 111 mmol/L 107  104  103   CO2 22 - 32 mmol/L 25  22  23    Calcium  8.9 - 10.3 mg/dL 8.5  8.6  9.1     HEPATIC:     Latest Ref Rng & Units 03/09/2024    8:01 AM 06/20/2022    5:05  AM 06/19/2022    2:54 AM  Hepatic Function  Total Protein 6.5 - 8.1 g/dL 6.4     Albumin 3.5 - 5.0 g/dL 3.1  1.7  1.7   AST 15 - 41 U/L 14     ALT 0 - 44 U/L 7     Alk Phosphatase 38 - 126 U/L 111     Total Bilirubin 0.0 - 1.2 mg/dL 0.3       CARDIAC: No results found for: "CKTOTAL", "CKMB", "CKMBINDEX", "TROPONINI"    Imaging: I personally reviewed and interpreted the available labs, imaging and endoscopic files.   Assessment/Plan: 68 y.o. male with a history of asthma, COPD, HTN, and HLD who presented to the ED with generalized weakness, anorexia, fatigue, melena for 1 week.  GI consulted for further evaluation of upper GI bleeding and symptomatic anemia.   Patient underwent EGD was found to have a large duodenal ulcer in the duodenal bulb with largest size of 30 mm which was injected with epinephrine  and coagulated successfully.  There was also presence of grade a esophagitis and some erythema.   Etiology of duodenal ulcer is likely related to high dose of NSAID use.  Discussed again with the patient importance of avoiding NSAIDs and smoking cessation.  He will need to have a repeat EGD in 2 to 3 months to assess lesion healing.  Has tolerated diet adequately without any further drop in his hemoglobin.  He can be discharged home today after finishing 72-hour PPI course.  - Pantoprazole  ggt for 72 hours, then switch to twice daily dosing orally - 2 large bore IV lines - Active T/S - Continue GI soft diet - Avoid NSAIDs - Smoking cessation - Repeat EGD in 2 to 3 months as outpatient - Patient will follow up in GI clinic in 3-4 weeks. - GI service will sign-off, please call us  back if you have any more questions.  Samantha Cress, MD Gastroenterology and Hepatology [facility]

## 2024-03-12 NOTE — Discharge Summary (Signed)
 Physician Discharge Summary   Patient: David Callahan MRN: [id number] DOB: [birth year]  Admit date:     03/09/2024  Discharge date: 03/12/24  Discharge Physician: Myrtie Atkinson Ikram Riebe   PCP: Jonah Negus, NP   Recommendations at discharge:   Please follow up with primary care provider within 1-2 weeks  Please repeat BMP and CBC in one week   Hospital Course: 68 year old male with a history of COPD, tobacco abuse, osteoarthritis, hypertension, restless leg syndrome presenting with generalized weakness for about a week.  The patient states that he has had some increased dyspnea on exertion during this period of time but denies any chest pain, coughing, hemoptysis.  He was contacted by his PCP about 2 weeks prior to this admission and told that he had a hemoglobin that was lower than usual in the low 7 range.  For the past week, the patient has been having intermittent melanotic stools.  He also 1 episode of emesis with coffee grounds. He denies any fevers, chills, headache, dizziness.  He does have some upper abdominal pain. Notably, the patient states that he has been taking up to 10 Aleve  every other day.  He denies any alcohol use.  He denies any dysuria, hematuria. In the ED, the patient was afebrile hemodynamically stable with oxygen  saturation 100% room air.  WBC 10.6, hemoglobin 6.7, platelets 480.  Sodium 136, potassium 3.6, bicarbonate 23, serum creatinine 1.26.  Troponin 8>> 7.  EKG showed sinus rhythm nonspecific ST changes.  Chest x-ray showed hyperinflation and chronic interstitial markings.  GI was consulted to assist with management.  He was transfused 1 unit PRBC.  Assessment and Plan: Melena/acute blood loss anemia -Presented with hemoglobin 6.7 - Baseline hemoglobin 8-9 - Concerned about peptic ulcer disease in the setting of chronic NSAID use - GI consult appreciated - Transfused 2 unit PRBC this admission - continue pantoprazole  bid after dc -5/29 EGD--large duodenal bulb ulcer  with nonbleeding visible vessel injected with epi and cauterized - 5/30 started on clears>>advance to soft diet as pt does not have any n/v, abd pain -5/31--tolerating diet, Hgb stable, no signs of active bleed, cleared by GI for discharge   Tobacco abuse - Tobacco cessation discussed - He smokes cigars, and 1 carton of cigarettes per week   Essential hypertension - Continue Cardizem  CD - holding benazepril  and hydrochlorothiazide  - restart hytrin   Mixed hyperlipidemia - Continue statin   COPD - continue bronchodilators - stable on RA         Consultants: GI Procedures performed: EGD 5/30  Disposition: Home Diet recommendation:  Cardiac diet DISCHARGE MEDICATION: Allergies as of 03/12/2024       Reactions   Motrin [ibuprofen] Swelling   Anti-inflammatory analgesics- cause anaphylaxis        Medication List     STOP taking these medications    amoxicillin -clavulanate 875-125 MG tablet Commonly known as: AUGMENTIN    benazepril  40 MG tablet Commonly known as: LOTENSIN    hydrochlorothiazide  25 MG tablet Commonly known as: HYDRODIURIL        TAKE these medications    albuterol  108 (90 Base) MCG/ACT inhaler Commonly known as: VENTOLIN  HFA Inhale 2 puffs into the lungs every 6 (six) hours as needed for shortness of breath.   atorvastatin  40 MG tablet Commonly known as: LIPITOR Take 40 mg by mouth daily.   cyclobenzaprine  10 MG tablet Commonly known as: FLEXERIL  Take 10 mg by mouth at bedtime.   diltiazem  360 MG 24 hr capsule Commonly known as:  CARDIZEM  CD Take 360 mg by mouth daily.   feeding supplement (ENSURE COMPLETE) Liqd Take 237 mLs by mouth 2 (two) times daily between meals.   HYDROcodone -acetaminophen  10-325 MG tablet Commonly known as: NORCO Take 1 tablet by mouth every 4 (four) hours as needed.   Incruse Ellipta  62.5 MCG/ACT Aepb Generic drug: umeclidinium bromide  Inhale 1 puff into the lungs daily.   pantoprazole  40 MG  tablet Commonly known as: PROTONIX  Take 1 tablet (40 mg total) by mouth 2 (two) times daily.   rOPINIRole  1 MG tablet Commonly known as: REQUIP  Take 1 mg by mouth daily.   terazosin 10 MG capsule Commonly known as: HYTRIN Take 10 mg by mouth daily.   traZODone  150 MG tablet Commonly known as: DESYREL  Take 150 mg by mouth at bedtime as needed.        Discharge Exam: Filed Weights   03/09/24 0711 03/09/24 1048  Weight: 78 kg 83.1 kg   HEENT:  [place]/AT, No thrush, no icterus CV:  RRR, no rub, no S3, no S4 Lung:  CTA, no wheeze, no rhonchi Abd:  soft/+BS, NT Ext:  No edema, no lymphangitis, no synovitis, no rash   Condition at discharge: stable  The results of significant diagnostics from this hospitalization (including imaging, microbiology, ancillary and laboratory) are listed below for reference.   Imaging Studies: DG Chest Port 1 View Result Date: 03/09/2024 CLINICAL DATA:  68 year old male with weakness. EXAM: PORTABLE CHEST 1 VIEW COMPARISON:  Chest radiographs 07/01/2022 and earlier. FINDINGS: Portable AP upright view at 0735 hours. Lungs appear somewhat hyperinflated, with improved left lung size and ventilation since 2023. Mediastinal contours are within normal limits. Visualized tracheal air column is within normal limits. Allowing for portable technique the lungs are clear. No pneumothorax or pleural effusion. Negative visible bowel gas. No acute osseous abnormality identified. Chronic severe right glenohumeral degeneration. IMPRESSION: No acute cardiopulmonary abnormality. Electronically Signed   By: Marlise Simpers M.D.   On: 03/09/2024 07:54    Microbiology: Results for orders placed or performed during the hospital encounter of 06/14/22  Culture, blood (Routine X 2) w Reflex to ID Panel     Status: None   Collection Time: 06/14/22  8:50 AM   Specimen: Left Antecubital; Blood  Result Value Ref Range Status   Specimen Description   Final    LEFT ANTECUBITAL BOTTLES DRAWN  AEROBIC AND ANAEROBIC   Special Requests Blood Culture adequate volume  Final   Culture   Final    NO GROWTH 5 DAYS Performed at [facility], [address], [place], [place] [address]    Report Status 06/19/2022 FINAL  Final  SARS Coronavirus 2 by RT PCR (hospital order, performed in [facility] hospital lab) *cepheid single result test* Anterior Nasal Swab     Status: None   Collection Time: 06/14/22  8:58 AM   Specimen: Anterior Nasal Swab  Result Value Ref Range Status   SARS Coronavirus 2 by RT PCR NEGATIVE NEGATIVE Final    Comment: (NOTE) SARS-CoV-2 target nucleic acids are NOT DETECTED.  The SARS-CoV-2 RNA is generally detectable in upper and lower respiratory specimens during the acute phase of infection. The lowest concentration of SARS-CoV-2 viral copies this assay can detect is 250 copies / mL. A negative result does not preclude SARS-CoV-2 infection and should not be used as the sole basis for treatment or other patient management decisions.  A negative result may occur with improper specimen collection / handling, submission of specimen other than  nasopharyngeal swab, presence of viral mutation(s) within the areas targeted by this assay, and inadequate number of viral copies (<250 copies / mL). A negative result must be combined with clinical observations, patient history, and epidemiological information.  Fact Sheet for Patients:   RoadLapTop.co.za  Fact Sheet for Healthcare Providers: http://kim-miller.com/  This test is not yet approved or  cleared by the [place]  FDA and has been authorized for detection and/or diagnosis of SARS-CoV-2 by FDA under an Emergency Use Authorization (EUA).  This EUA will remain in effect (meaning this test can be used) for the duration of the COVID-19 declaration under Section 564(b)(1) of the Act, 21 U.S.C. section 360bbb-3(b)(1), unless the authorization is terminated or revoked  sooner.  Performed at [facility], [address], [place], [place] [address]   Culture, blood (Routine X 2) w Reflex to ID Panel     Status: None   Collection Time: 06/14/22  9:09 AM   Specimen: BLOOD RIGHT HAND  Result Value Ref Range Status   Specimen Description   Final    BLOOD RIGHT HAND BOTTLES DRAWN AEROBIC AND ANAEROBIC   Special Requests   Final    Blood Culture results may not be optimal due to an excessive volume of blood received in culture bottles   Culture   Final    NO GROWTH 5 DAYS Performed at [facility], [address], [place], [place] [address]    Report Status 06/19/2022 FINAL  Final  MRSA Next Gen by PCR, Nasal     Status: Abnormal   Collection Time: 06/14/22  9:45 AM   Specimen: Nasal Mucosa; Nasal Swab  Result Value Ref Range Status   MRSA by PCR Next Gen DETECTED (A) NOT DETECTED Final    Comment: RESULT CALLED TO, READ BACK BY AND VERIFIED WITH: EANES M @ 1138 ON [id number] BY HENDERSON L (NOTE) The GeneXpert MRSA Assay (FDA approved for NASAL specimens only), is one component of a comprehensive MRSA colonization surveillance program. It is not intended to diagnose MRSA infection nor to guide or monitor treatment for MRSA infections. Test performance is not FDA approved in patients less than 67 years old. Performed at [facility], [address] [place], [place] [address]   Respiratory (~20 pathogens) panel by PCR     Status: None   Collection Time: 06/14/22  9:30 PM   Specimen: Nasopharyngeal Swab; Respiratory  Result Value Ref Range Status   Adenovirus NOT DETECTED NOT DETECTED Final   Coronavirus 229E NOT DETECTED NOT DETECTED Final    Comment: (NOTE) The Coronavirus on the Respiratory Panel, DOES NOT test for the novel  Coronavirus (2019 nCoV)    Coronavirus HKU1 NOT DETECTED NOT DETECTED Final   Coronavirus NL63 NOT DETECTED NOT DETECTED Final   Coronavirus OC43 NOT DETECTED NOT DETECTED Final   Metapneumovirus NOT DETECTED NOT DETECTED Final    Rhinovirus / Enterovirus NOT DETECTED NOT DETECTED Final   Influenza A NOT DETECTED NOT DETECTED Final   Influenza B NOT DETECTED NOT DETECTED Final   Parainfluenza Virus 1 NOT DETECTED NOT DETECTED Final   Parainfluenza Virus 2 NOT DETECTED NOT DETECTED Final   Parainfluenza Virus 3 NOT DETECTED NOT DETECTED Final   Parainfluenza Virus 4 NOT DETECTED NOT DETECTED Final   Respiratory Syncytial Virus NOT DETECTED NOT DETECTED Final   Bordetella pertussis NOT DETECTED NOT DETECTED Final   Bordetella Parapertussis NOT DETECTED NOT DETECTED Final   Chlamydophila pneumoniae NOT DETECTED NOT DETECTED Final   Mycoplasma  pneumoniae NOT DETECTED NOT DETECTED Final    Comment: Performed at [facility], [address] [place], [place] [address]  Aerobic/Anaerobic Culture w Gram Stain (surgical/deep wound)     Status: None   Collection Time: 06/16/22  1:14 PM   Specimen: Lung  Result Value Ref Range Status   Specimen Description LUNG  Final   Special Requests NONE  Final   Gram Stain   Final    RARE WBC PRESENT,BOTH PMN AND MONONUCLEAR NO ORGANISMS SEEN    Culture   Final    No growth aerobically or anaerobically. Performed at [facility], [address] [place], [place] [address]    Report Status 06/21/2022 FINAL  Final  Body fluid culture w Gram Stain     Status: None   Collection Time: 06/16/22  4:55 PM   Specimen: Pleural Fluid  Result Value Ref Range Status   Specimen Description PLEURAL  Final   Special Requests NONE  Final   Gram Stain NO WBC SEEN NO ORGANISMS SEEN   Final   Culture   Final    NO GROWTH 3 DAYS Performed at [facility], [address], [place], [place] [address]    Report Status 06/20/2022 FINAL  Final    Labs: CBC: Recent Labs  Lab 03/09/24 0801 03/09/24 2355 03/10/24 0426 03/11/24 0425 03/12/24 0252  WBC 10.6*  --  11.2* 8.5 8.8  NEUTROABS 8.6*  --   --   --   --   HGB 6.7* 7.2* 7.7* 7.5* 7.5*  HCT 21.2* 22.6* 23.9* 23.2*  23.2*  MCV 93.8  --  94.5 92.8 93.9  PLT 480*  --  277 442* 442*   Basic Metabolic Panel: Recent Labs  Lab 03/09/24 0801 03/10/24 0426 03/12/24 0252  NA 136 136 139  K 3.6 3.9 4.0  CL 103 104 107  CO2 23 22 25   GLUCOSE 112* 86 99  BUN 29* 22 18  CREATININE 1.26* 1.07 1.18  CALCIUM  9.1 8.6* 8.5*   Liver Function Tests: Recent Labs  Lab 03/09/24 0801  AST 14*  ALT 7  ALKPHOS 111  BILITOT 0.3  PROT 6.4*  ALBUMIN 3.1*   CBG: No results for input(s): "GLUCAP" in the last 168 hours.  Discharge time spent: greater than 30 minutes.  Signed: Demaris Fillers, MD [facility] 03/12/2024

## 2024-03-13 NOTE — Anesthesia Postprocedure Evaluation (Signed)
 Anesthesia Post Note  Patient: David Callahan  Procedure(s) Performed: EGD (ESOPHAGOGASTRODUODENOSCOPY)  Patient location during evaluation: Phase II Anesthesia Type: General Level of consciousness: awake Pain management: pain level controlled Vital Signs Assessment: post-procedure vital signs reviewed and stable Respiratory status: spontaneous breathing and respiratory function stable Cardiovascular status: blood pressure returned to baseline and stable Postop Assessment: no headache and no apparent nausea or vomiting Anesthetic complications: no Comments: Late entry   No notable events documented.   Last Vitals:  Vitals:   03/12/24 0545 03/12/24 0803  BP: 136/67 135/76  Pulse: 76   Resp: 17   Temp: 36.5 C   SpO2: 100% 99%    Last Pain:  Vitals:   03/12/24 0803  TempSrc:   PainSc: 5                  Coretha Dew

## 2024-03-22 ENCOUNTER — Other Ambulatory Visit: Payer: Self-pay | Admitting: *Deleted

## 2024-03-22 DIAGNOSIS — D62 Acute posthemorrhagic anemia: Secondary | ICD-10-CM

## 2024-03-22 DIAGNOSIS — D509 Iron deficiency anemia, unspecified: Secondary | ICD-10-CM

## 2024-03-22 NOTE — Telephone Encounter (Signed)
 Tried to reach pt several times. Mailed pt a letter and lab requisitions

## 2024-04-04 ENCOUNTER — Inpatient Hospital Stay (HOSPITAL_COMMUNITY)
Admission: EM | Admit: 2024-04-04 | Discharge: 2024-04-07 | DRG: 812 | Disposition: A | Discharge status: Home / Self Care | Attending: Internal Medicine | Admitting: Internal Medicine

## 2024-04-04 ENCOUNTER — Encounter (HOSPITAL_COMMUNITY): Payer: Self-pay

## 2024-04-04 ENCOUNTER — Other Ambulatory Visit: Payer: Self-pay

## 2024-04-04 DIAGNOSIS — J449 Chronic obstructive pulmonary disease, unspecified: Secondary | ICD-10-CM | POA: Diagnosis present

## 2024-04-04 DIAGNOSIS — I5031 Acute diastolic (congestive) heart failure: Secondary | ICD-10-CM | POA: Diagnosis not present

## 2024-04-04 DIAGNOSIS — R195 Other fecal abnormalities: Secondary | ICD-10-CM

## 2024-04-04 DIAGNOSIS — E785 Hyperlipidemia, unspecified: Secondary | ICD-10-CM | POA: Diagnosis present

## 2024-04-04 DIAGNOSIS — E876 Hypokalemia: Secondary | ICD-10-CM | POA: Diagnosis not present

## 2024-04-04 DIAGNOSIS — R0602 Shortness of breath: Secondary | ICD-10-CM | POA: Diagnosis not present

## 2024-04-04 DIAGNOSIS — N179 Acute kidney failure, unspecified: Secondary | ICD-10-CM | POA: Diagnosis present

## 2024-04-04 DIAGNOSIS — R600 Localized edema: Secondary | ICD-10-CM | POA: Diagnosis present

## 2024-04-04 DIAGNOSIS — Z8711 Personal history of peptic ulcer disease: Secondary | ICD-10-CM | POA: Diagnosis not present

## 2024-04-04 DIAGNOSIS — D649 Anemia, unspecified: Secondary | ICD-10-CM | POA: Diagnosis present

## 2024-04-04 DIAGNOSIS — E782 Mixed hyperlipidemia: Secondary | ICD-10-CM | POA: Diagnosis not present

## 2024-04-04 DIAGNOSIS — F17210 Nicotine dependence, cigarettes, uncomplicated: Secondary | ICD-10-CM | POA: Diagnosis present

## 2024-04-04 DIAGNOSIS — I10 Essential (primary) hypertension: Secondary | ICD-10-CM | POA: Diagnosis present

## 2024-04-04 DIAGNOSIS — Z79899 Other long term (current) drug therapy: Secondary | ICD-10-CM | POA: Diagnosis not present

## 2024-04-04 DIAGNOSIS — F1090 Alcohol use, unspecified, uncomplicated: Secondary | ICD-10-CM | POA: Diagnosis not present

## 2024-04-04 DIAGNOSIS — R609 Edema, unspecified: Secondary | ICD-10-CM | POA: Diagnosis not present

## 2024-04-04 DIAGNOSIS — D509 Iron deficiency anemia, unspecified: Secondary | ICD-10-CM | POA: Diagnosis present

## 2024-04-04 DIAGNOSIS — D500 Iron deficiency anemia secondary to blood loss (chronic): Secondary | ICD-10-CM | POA: Diagnosis not present

## 2024-04-04 DIAGNOSIS — J4489 Other specified chronic obstructive pulmonary disease: Secondary | ICD-10-CM | POA: Diagnosis present

## 2024-04-04 DIAGNOSIS — K5710 Diverticulosis of small intestine without perforation or abscess without bleeding: Secondary | ICD-10-CM | POA: Diagnosis not present

## 2024-04-04 DIAGNOSIS — R918 Other nonspecific abnormal finding of lung field: Secondary | ICD-10-CM | POA: Diagnosis not present

## 2024-04-04 DIAGNOSIS — R0902 Hypoxemia: Secondary | ICD-10-CM | POA: Diagnosis not present

## 2024-04-04 DIAGNOSIS — R011 Cardiac murmur, unspecified: Secondary | ICD-10-CM | POA: Diagnosis not present

## 2024-04-04 LAB — CBC WITH DIFFERENTIAL/PLATELET
Abs Immature Granulocytes: 0.07 10*3/uL (ref 0.00–0.07)
Basophils Absolute: 0 10*3/uL (ref 0.0–0.1)
Basophils Relative: 0 %
Eosinophils Absolute: 0 10*3/uL (ref 0.0–0.5)
Eosinophils Relative: 0 %
HCT: 19.5 % — ABNORMAL LOW (ref 39.0–52.0)
Hemoglobin: 6.1 g/dL — CL (ref 13.0–17.0)
Immature Granulocytes: 1 %
Lymphocytes Relative: 12 %
Lymphs Abs: 1 10*3/uL (ref 0.7–4.0)
MCH: 26.9 pg (ref 26.0–34.0)
MCHC: 31.3 g/dL (ref 30.0–36.0)
MCV: 85.9 fL (ref 80.0–100.0)
Monocytes Absolute: 0.5 10*3/uL (ref 0.1–1.0)
Monocytes Relative: 6 %
Neutro Abs: 6.5 10*3/uL (ref 1.7–7.7)
Neutrophils Relative %: 81 %
Platelets: 489 10*3/uL — ABNORMAL HIGH (ref 150–400)
RBC: 2.27 MIL/uL — ABNORMAL LOW (ref 4.22–5.81)
RDW: 18.6 % — ABNORMAL HIGH (ref 11.5–15.5)
WBC: 8 10*3/uL (ref 4.0–10.5)
nRBC: 0.4 % — ABNORMAL HIGH (ref 0.0–0.2)

## 2024-04-04 LAB — COMPREHENSIVE METABOLIC PANEL WITH GFR
ALT: 12 U/L (ref 0–44)
AST: 17 U/L (ref 15–41)
Albumin: 3.2 g/dL — ABNORMAL LOW (ref 3.5–5.0)
Alkaline Phosphatase: 84 U/L (ref 38–126)
Anion gap: 13 (ref 5–15)
BUN: 25 mg/dL — ABNORMAL HIGH (ref 8–23)
CO2: 26 mmol/L (ref 22–32)
Calcium: 8.6 mg/dL — ABNORMAL LOW (ref 8.9–10.3)
Chloride: 100 mmol/L (ref 98–111)
Creatinine, Ser: 1.52 mg/dL — ABNORMAL HIGH (ref 0.61–1.24)
GFR, Estimated: 50 mL/min — ABNORMAL LOW (ref >60)
Glucose, Bld: 120 mg/dL — ABNORMAL HIGH (ref 70–99)
Potassium: 3.9 mmol/L (ref 3.5–5.1)
Sodium: 139 mmol/L (ref 135–145)
Total Bilirubin: 0.4 mg/dL (ref 0.0–1.2)
Total Protein: 6.2 g/dL — ABNORMAL LOW (ref 6.5–8.1)

## 2024-04-04 LAB — PREPARE RBC (CROSSMATCH)

## 2024-04-04 LAB — POC OCCULT BLOOD, ED: Occult Blood: POSITIVE — AB

## 2024-04-04 MED ORDER — ROPINIROLE HCL 1 MG PO TABS
1.0000 mg | ORAL_TABLET | Freq: Every day | ORAL | Status: DC
  Administered 2024-04-04 – 2024-04-06 (×3): 1 mg via ORAL
  Filled 2024-04-04 (×3): qty 1

## 2024-04-04 MED ORDER — ACETAMINOPHEN 325 MG PO TABS: 650.0000 mg | ORAL_TABLET | Freq: Four times a day (QID) | ORAL | Status: DC | PRN

## 2024-04-04 MED ORDER — TERAZOSIN HCL 5 MG PO CAPS
10.0000 mg | ORAL_CAPSULE | Freq: Every day | ORAL | Status: DC
  Administered 2024-04-05 – 2024-04-07 (×3): 10 mg via ORAL
  Filled 2024-04-04 (×4): qty 2

## 2024-04-04 MED ORDER — PROCHLORPERAZINE EDISYLATE 10 MG/2ML IJ SOLN: 5.0000 mg | Freq: Four times a day (QID) | INTRAMUSCULAR | Status: DC | PRN

## 2024-04-04 MED ORDER — PANTOPRAZOLE SODIUM 40 MG IV SOLR
40.0000 mg | Freq: Two times a day (BID) | INTRAVENOUS | Status: DC
  Administered 2024-04-05 – 2024-04-07 (×5): 40 mg via INTRAVENOUS
  Filled 2024-04-04 (×5): qty 10

## 2024-04-04 MED ORDER — DILTIAZEM HCL ER COATED BEADS 120 MG PO CP24
360.0000 mg | ORAL_CAPSULE | Freq: Every day | ORAL | Status: DC
  Administered 2024-04-05 – 2024-04-07 (×3): 360 mg via ORAL
  Filled 2024-04-04 (×3): qty 3

## 2024-04-04 MED ORDER — OXYCODONE HCL 5 MG PO TABS
5.0000 mg | ORAL_TABLET | ORAL | Status: DC | PRN
  Administered 2024-04-04 – 2024-04-06 (×6): 5 mg via ORAL
  Administered 2024-04-07 (×2): 10 mg via ORAL
  Filled 2024-04-04: qty 1
  Filled 2024-04-04 (×2): qty 2
  Filled 2024-04-04 (×5): qty 1

## 2024-04-04 MED ORDER — TRAZODONE HCL 50 MG PO TABS: 150.0000 mg | ORAL_TABLET | Freq: Every evening | ORAL | Status: DC | PRN

## 2024-04-04 MED ORDER — SODIUM CHLORIDE 0.9% FLUSH
3.0000 mL | Freq: Two times a day (BID) | INTRAVENOUS | Status: DC
  Administered 2024-04-04 – 2024-04-07 (×6): 3 mL via INTRAVENOUS

## 2024-04-04 MED ORDER — ACETAMINOPHEN 650 MG RE SUPP: 650.0000 mg | Freq: Four times a day (QID) | RECTAL | Status: DC | PRN

## 2024-04-04 MED ORDER — ALBUTEROL SULFATE (2.5 MG/3ML) 0.083% IN NEBU
2.5000 mg | INHALATION_SOLUTION | Freq: Four times a day (QID) | RESPIRATORY_TRACT | Status: DC | PRN
  Administered 2024-04-05: 2.5 mg via RESPIRATORY_TRACT
  Filled 2024-04-04: qty 3

## 2024-04-04 MED ORDER — PANTOPRAZOLE SODIUM 40 MG IV SOLR
40.0000 mg | Freq: Once | INTRAVENOUS | Status: AC
  Administered 2024-04-04: 40 mg via INTRAVENOUS
  Filled 2024-04-04: qty 10

## 2024-04-04 MED ORDER — ATORVASTATIN CALCIUM 40 MG PO TABS
40.0000 mg | ORAL_TABLET | Freq: Every day | ORAL | Status: DC
  Administered 2024-04-05 – 2024-04-07 (×3): 40 mg via ORAL
  Filled 2024-04-04 (×3): qty 1

## 2024-04-04 MED ORDER — CYCLOBENZAPRINE HCL 10 MG PO TABS
10.0000 mg | ORAL_TABLET | Freq: Every day | ORAL | Status: DC
  Administered 2024-04-04 – 2024-04-06 (×3): 10 mg via ORAL
  Filled 2024-04-04 (×3): qty 1

## 2024-04-04 MED ORDER — SODIUM CHLORIDE 0.9 % IV SOLN: INTRAVENOUS | Status: DC

## 2024-04-04 MED ORDER — SODIUM CHLORIDE 0.9% IV SOLUTION: Freq: Once | INTRAVENOUS | Status: AC

## 2024-04-04 NOTE — Plan of Care (Signed)

## 2024-04-04 NOTE — ED Notes (Signed)
 ED TO INPATIENT HANDOFF REPORT  ED Nurse Name and Phone #: [phone]  S Name/Age/Gender David Callahan 68 y.o. male Room/Bed: APA01/APA01  Code Status   Code Status: Prior  Home/SNF/Other Home Patient oriented to: self, place, time, and situation Is this baseline? Yes   Triage Complete: Triage complete  Chief Complaint Symptomatic anemia [D64.9]  Triage Note Pt arrived via POV from home following a notification form his PCP office that he should go to the ER for repeat testing and possible blood transfusion following recent lab work he had drawn at their facility last week. Pt reports he received 2 units of blood last time he was here. Pt endorses fatigue and feels like he looks more pale.    Allergies Allergies  Allergen Reactions   Motrin [Ibuprofen] Anaphylaxis and Swelling    Anti-inflammatory analgesics- cause anaphylaxis    Level of Care/Admitting Diagnosis ED Disposition     ED Disposition  Admit   Condition  --   Comment  Hospital Area: [facility] [[id number]]  Level of Care: Telemetry [5]  Covid Evaluation: Asymptomatic - no recent exposure (last 10 days) testing not required  Diagnosis: Symptomatic anemia [8671310]  Admitting Physician: CHARLTON EVALENE RAMAN [[id number]]  Attending Physician: CHARLTON EVALENE RAMAN [[id number]]  Certification:: I certify this patient will need inpatient services for at least 2 midnights  Expected Medical Readiness: 04/07/2024          B Medical/Surgery History Past Medical History:  Diagnosis Date   Asthma    COPD (chronic obstructive pulmonary disease) (HCC)    Heart murmur    Hypertension    Sleep apnea    Past Surgical History:  Procedure Laterality Date   COLONOSCOPY     ESOPHAGOGASTRODUODENOSCOPY N/A 03/10/2024   Procedure: EGD (ESOPHAGOGASTRODUODENOSCOPY);  Surgeon: Cinderella Deatrice FALCON, MD;  Location: [facility];  Service: Endoscopy;  Laterality: N/A;   JOINT REPLACEMENT Left    knee   KNEE SURGERY        A IV Location/Drains/Wounds Patient Lines/Drains/Airways Status     Active Line/Drains/Airways     Name Placement date Placement time Site Days   Peripheral IV 04/04/24 18 G Left Antecubital 04/04/24  1750  Antecubital  less than 1            Intake/Output Last 24 hours No intake or output data in the 24 hours ending 04/04/24 2026  Labs/Imaging Results for orders placed or performed during the hospital encounter of 04/04/24 (from the past 48 hours)  CBC with Differential     Status: Abnormal   Collection Time: 04/04/24  5:50 PM  Result Value Ref Range   WBC 8.0 4.0 - 10.5 K/uL   RBC 2.27 (L) 4.22 - 5.81 MIL/uL   Hemoglobin 6.1 (LL) 13.0 - 17.0 g/dL    Comment: REPEATED TO VERIFY THIS CRITICAL RESULT HAS VERIFIED AND BEEN CALLED TO K PICKURAL BY SHANA DALTON ON 06 23 2025 AT 1812, AND HAS BEEN READ BACK.     HCT 19.5 (L) 39.0 - 52.0 %   MCV 85.9 80.0 - 100.0 fL   MCH 26.9 26.0 - 34.0 pg   MCHC 31.3 30.0 - 36.0 g/dL   RDW 81.3 (H) 88.4 - 84.4 %   Platelets 489 (H) 150 - 400 K/uL   nRBC 0.4 (H) 0.0 - 0.2 %   Neutrophils Relative % 81 %   Neutro Abs 6.5 1.7 - 7.7 K/uL   Lymphocytes Relative 12 %   Lymphs Abs 1.0  0.7 - 4.0 K/uL   Monocytes Relative 6 %   Monocytes Absolute 0.5 0.1 - 1.0 K/uL   Eosinophils Relative 0 %   Eosinophils Absolute 0.0 0.0 - 0.5 K/uL   Basophils Relative 0 %   Basophils Absolute 0.0 0.0 - 0.1 K/uL   Immature Granulocytes 1 %   Abs Immature Granulocytes 0.07 0.00 - 0.07 K/uL    Comment: Performed at [facility], [address], [place], [place] [address]  Comprehensive metabolic panel     Status: Abnormal   Collection Time: 04/04/24  5:50 PM  Result Value Ref Range   Sodium 139 135 - 145 mmol/L   Potassium 3.9 3.5 - 5.1 mmol/L   Chloride 100 98 - 111 mmol/L   CO2 26 22 - 32 mmol/L   Glucose, Bld 120 (H) 70 - 99 mg/dL    Comment: Glucose reference range applies only to samples taken after fasting for at least 8 hours.   BUN 25 (H) 8 -  23 mg/dL   Creatinine, Ser 8.47 (H) 0.61 - 1.24 mg/dL   Calcium  8.6 (L) 8.9 - 10.3 mg/dL   Total Protein 6.2 (L) 6.5 - 8.1 g/dL   Albumin 3.2 (L) 3.5 - 5.0 g/dL   AST 17 15 - 41 U/L   ALT 12 0 - 44 U/L   Alkaline Phosphatase 84 38 - 126 U/L   Total Bilirubin 0.4 0.0 - 1.2 mg/dL   GFR, Estimated 50 (L) >60 mL/min    Comment: (NOTE) Calculated using the CKD-EPI Creatinine Equation (2021)    Anion gap 13 5 - 15    Comment: Performed at [facility], [address], [place], [place] [address]  Type and screen [facility]     Status: None (Preliminary result)   Collection Time: 04/04/24  5:50 PM  Result Value Ref Range   ABO/RH(D) O POS    Antibody Screen NEG    Sample Expiration 04/07/2024,2359    Unit Number [id number]    Blood Component Type RED CELLS,LR    Unit division 00    Status of Unit ISSUED    Transfusion Status OK TO TRANSFUSE    Crossmatch Result      Compatible Performed at [facility], [address] [place], [place] [address]    Unit Number [id number]    Blood Component Type RBC LR PHER1    Unit division 00    Status of Unit ALLOCATED    Transfusion Status OK TO TRANSFUSE    Crossmatch Result Compatible   Prepare RBC (crossmatch)     Status: None   Collection Time: 04/04/24  6:23 PM  Result Value Ref Range   Order Confirmation      ORDER PROCESSED BY BLOOD BANK Performed at [facility], [address] [place], [place] [address]   POC occult blood, ED     Status: Abnormal   Collection Time: 04/04/24  6:36 PM  Result Value Ref Range   Occult Blood positive (A)    No results found.  Pending Labs Unresulted Labs (From admission, onward)    None       Vitals/Pain Today's Vitals   04/04/24 1911 04/04/24 1915 04/04/24 1935 04/04/24 1959  BP: (!) 158/66 131/74 (!) 156/84   Pulse: 64 68 66   Resp: 19 (!) 23 (!) 22   Temp: 98.5 F (36.9 C)     TempSrc: Oral     SpO2: 94% 98% 96%   Weight:  Height:      PainSc:    0-No pain     Isolation Precautions No active isolations  Medications Medications  pantoprazole  (PROTONIX ) injection 40 mg (has no administration in time range)  0.9 %  sodium chloride  infusion (Manually program via Guardrails IV Fluids) ( Intravenous New Bag/Given 04/04/24 1857)    Mobility walks      R Recommendations: See Admitting Provider Note  Report given to:   Additional Notes:

## 2024-04-04 NOTE — ED Triage Notes (Signed)
 Pt arrived via POV from home following a notification form his PCP office that he should go to the ER for repeat testing and possible blood transfusion following recent lab work he had drawn at their facility last week. Pt reports he received 2 units of blood last time he was here. Pt endorses fatigue and feels like he looks more pale.

## 2024-04-04 NOTE — ED Provider Notes (Addendum)
 [place] EMERGENCY DEPARTMENT AT [facility] Provider Note   CSN: [id number] Arrival date & time: 04/04/24  1700     Patient presents with: Abnormal Lab   David Callahan is a 68 y.o. male who presents to the emergency department with a chief complaint of abnormal lab.  Patient states that he was contacted by his primary care office and told to report to the emergency department for repeat testing and possible blood transfusion.  Patient states that last time this happened he received 2 units of blood, patient endorses fatigue and feels like he looks more pale.  Past medical history significant for COPD with bronchitis, acute respiratory failure with hypoxia, hemorrhagic peptic ulcer, duodenal ulcer, symptomatic anemia.    Abnormal Lab      Prior to Admission medications   Medication Sig Start Date End Date Taking? Authorizing Provider  acetaminophen  (TYLENOL ) 500 MG tablet Take 1,000 mg by mouth every 6 (six) hours as needed for mild pain (pain score 1-3).   Yes [provider]  albuterol  (VENTOLIN  HFA) 108 (90 Base) MCG/ACT inhaler Inhale 2 puffs into the lungs every 6 (six) hours as needed for shortness of breath.   Yes [provider]  atorvastatin  (LIPITOR) 40 MG tablet Take 40 mg by mouth daily.   Yes [provider]  cyclobenzaprine  (FLEXERIL ) 10 MG tablet Take 10 mg by mouth at bedtime. 09/17/20  Yes [provider]  diltiazem  (CARDIZEM  CD) 360 MG 24 hr capsule Take 360 mg by mouth daily. 02/16/24  Yes [provider]  feeding supplement, ENSURE COMPLETE, (ENSURE COMPLETE) LIQD Take 237 mLs by mouth 2 (two) times daily between meals. 03/12/24  Yes Tat, Alm, MD  furosemide  (LASIX ) 20 MG tablet Take 20 mg by mouth in the morning, at noon, and at bedtime. 03/31/24 04/04/24 Yes [provider]  HYDROcodone -acetaminophen  (NORCO) 10-325 MG tablet Take 1 tablet by mouth every 4 (four) hours as needed for moderate pain (pain  score 4-6). 02/10/24  Yes [provider]  pantoprazole  (PROTONIX ) 40 MG tablet Take 1 tablet (40 mg total) by mouth 2 (two) times daily. 03/12/24  Yes TatAlm, MD  predniSONE  (DELTASONE ) 50 MG tablet Take 50 mg by mouth daily with breakfast. 03/31/24 04/05/24 Yes [provider]  rOPINIRole  (REQUIP ) 1 MG tablet Take 1 mg by mouth daily. 02/16/24  Yes [provider]  terazosin  (HYTRIN ) 10 MG capsule Take 10 mg by mouth daily. 02/16/24  Yes [provider]  traZODone  (DESYREL ) 150 MG tablet Take 150 mg by mouth at bedtime as needed for sleep. 02/16/24  Yes [provider]  umeclidinium bromide  (INCRUSE ELLIPTA ) 62.5 MCG/ACT AEPB Inhale 1 puff into the lungs daily. 06/23/22  Yes Bryn Bernardino NOVAK, MD    Allergies: Motrin [ibuprofen]    Review of Systems  Constitutional:  Positive for fatigue.    Updated Vital Signs BP (!) 184/83   Pulse 66   Temp 98.2 F (36.8 C) (Oral)   Resp 16   Ht 5' 10 (1.778 m)   Wt 92.5 kg   SpO2 97%   BMI 29.26 kg/m   Physical Exam Vitals and nursing note reviewed.  Constitutional:      General: He is not in acute distress.    Appearance: Normal appearance. He is not ill-appearing, toxic-appearing or diaphoretic.  HENT:     Head: Normocephalic and atraumatic.   Eyes:     General: No scleral icterus.    Extraocular Movements: Extraocular  movements intact.    Cardiovascular:     Rate and Rhythm: Normal rate and regular rhythm.  Pulmonary:     Effort: Pulmonary effort is normal. No respiratory distress.     Breath sounds: Normal breath sounds. No wheezing, rhonchi or rales.  Abdominal:     General: Abdomen is flat.     Palpations: Abdomen is soft.   Musculoskeletal:        General: Normal range of motion.     Right lower leg: Edema (pitting edema) present.     Left lower leg: Edema (pitting edema) present.   Skin:    General: Skin is warm and dry.     Capillary Refill: Capillary refill takes less than 2  seconds.   Neurological:     General: No focal deficit present.     Mental Status: He is alert and oriented to person, place, and time.     (all labs ordered are listed, but only abnormal results are displayed) Labs Reviewed  CBC WITH DIFFERENTIAL/PLATELET - Abnormal; Notable for the following components:      Result Value   RBC 2.27 (*)    Hemoglobin 6.1 (*)    HCT 19.5 (*)    RDW 18.6 (*)    Platelets 489 (*)    nRBC 0.4 (*)    All other components within normal limits  COMPREHENSIVE METABOLIC PANEL WITH GFR - Abnormal; Notable for the following components:   Glucose, Bld 120 (*)    BUN 25 (*)    Creatinine, Ser 1.52 (*)    Calcium  8.6 (*)    Total Protein 6.2 (*)    Albumin 3.2 (*)    GFR, Estimated 50 (*)    All other components within normal limits  POC OCCULT BLOOD, ED - Abnormal; Notable for the following components:   Occult Blood positive (*)    All other components within normal limits  BASIC METABOLIC PANEL WITH GFR  CBC  CBC  CBC  CBC  TYPE AND SCREEN  PREPARE RBC (CROSSMATCH)    EKG: None  Radiology: No results found.   .Critical Care  Performed by: Janetta Terrall FALCON, PA-C Authorized by: Janetta Terrall FALCON, PA-C   Critical care provider statement:    Critical care time (minutes):  30   Critical care was necessary to treat or prevent imminent or life-threatening deterioration of the following conditions:  Circulatory failure   Critical care was time spent personally by me on the following activities:  Development of treatment plan with patient or surrogate, discussions with consultants, evaluation of patient's response to treatment, examination of patient, ordering and review of laboratory studies, ordering and review of radiographic studies, ordering and performing treatments and interventions, pulse oximetry, re-evaluation of patient's condition and review of old charts Comments:     Symptomatic anemia, hemoglobin in ED 6.1, transfusion ordered,  patient admitted with GI consult     Medications Ordered in the ED  atorvastatin  (LIPITOR) tablet 40 mg (has no administration in time range)  diltiazem  (CARDIZEM  CD) 24 hr capsule 360 mg (has no administration in time range)  terazosin  (HYTRIN ) capsule 10 mg (has no administration in time range)  traZODone  (DESYREL ) tablet 150 mg (has no administration in time range)  cyclobenzaprine  (FLEXERIL ) tablet 10 mg (10 mg Oral Given 04/04/24 2222)  rOPINIRole  (REQUIP ) tablet 1 mg (1 mg Oral Given 04/04/24 2222)  albuterol  (PROVENTIL ) (2.5 MG/3ML) 0.083% nebulizer solution 2.5 mg (has no administration in time range)  sodium chloride  flush (  NS) 0.9 % injection 3 mL (3 mLs Intravenous Given 04/04/24 2222)  pantoprazole  (PROTONIX ) injection 40 mg (has no administration in time range)  0.9 %  sodium chloride  infusion (has no administration in time range)  acetaminophen  (TYLENOL ) tablet 650 mg (has no administration in time range)    Or  acetaminophen  (TYLENOL ) suppository 650 mg (has no administration in time range)  oxyCODONE  (Oxy IR/ROXICODONE ) immediate release tablet 5-10 mg (5 mg Oral Given 04/04/24 2229)  prochlorperazine  (COMPAZINE ) injection 5 mg (has no administration in time range)  0.9 %  sodium chloride  infusion (Manually program via Guardrails IV Fluids) (0 mLs Intravenous Stopped 04/04/24 2145)  pantoprazole  (PROTONIX ) injection 40 mg (40 mg Intravenous Given 04/04/24 2118)    Clinical Course as of 04/04/24 2346  Mon Apr 04, 2024  1837 POC occult blood, ED [CH]    Clinical Course User Index [CH] Ajanae Virag, Terrall FALCON, PA-C                                 Medical Decision Making Amount and/or Complexity of Data Reviewed Labs: ordered. Decision-making details documented in ED Course.  Risk Prescription drug management. Decision regarding hospitalization.   Patient presents to the ED for concern of abnormal labs, low hemoglobin, this involves an extensive number of treatment options,  and is a complaint that carries with it a high risk of complications and morbidity.  The differential diagnosis includes symptomatic anemia, GI bleed, hemorrhage, shock, trauma/injury, sepsis, etc.   Co morbidities that complicate the patient evaluation  COPD, symptomatic anemia, duodenal ulcer, hemorrhagic peptic ulcer, acute blood loss anemia   Additional history obtained:  Additional history obtained from Past Admission   External records from outside source obtained and reviewed including past admission and EGD results from 03/09/2024 for symptomatic anemia with duodenal ulcer   Lab Tests:  I Ordered, and personally interpreted labs.  The pertinent results include: CBC-hemoglobin of 6.1, CMP-creatinine 1.52, albumin 3.2, GFR 50, fecal occult blood positive   Medicines ordered and prescription drug management:  I ordered medication including 2 units of packed red blood cells for transfusion for symptomatic anemia, hgb 6.1 Reevaluation of the patient after these medicines showed that the patient stayed the same I have reviewed the patients home medicines and have made adjustments as needed   Test Considered:  Abdominal imaging: Declined at this time as patient vitals are stable, patient had known duodenal ulcer fixed with EGD on 03/09/2024 and was recently discharged from the hospital with GI follow-up.  No frank blood on rectal examination, however fecal occult blood positive.  Patient denies hematemesis or hematochezia.  Denies hematuria.   Critical Interventions:  Blood transfusion for symptomatic anemia, hemoglobin 6.1   Consultations Obtained:  I requested consultation with the GI team as well as hospitalist,  and discussed lab and imaging findings as well as pertinent plan - they recommend: Admission to the hospital for ongoing diagnosis and treatment   Problem List / ED Course:  68 year old male with past medical history of duodenal ulcer found on EGD and repaired  on 03/09/2024 presents to the emergency department due to low hemoglobin on outpatient PCP lab testing Denies blood thinning medications Low hemoglobin confirmed here with the result of 6.1 Fecal occult blood positive Vitals stable, patient fairly asymptomatic other than subjective drowsiness, 2 units of packed red blood cells ordered and transfusion begun Hospitalist consulted for admission for symptomatic anemia as well  as GI follow-up for ongoing anemia presumably due to ulcer One dose of protonix  ordered in the ED  Of note on physical exam patient does have bilateral 2+ pitting edema, patient states that he is on a fluid pill outpatient.  Denies heart failure, denies shortness of breath, denies kidney disease. Considered lasix  in the ED but held off since patient will be admitted.  Patient admitted for ongoing diagnosis and treatment Based off of workup in the emergency department today most likely diagnosis is ongoing complication of previous GI bleed   Reevaluation:  After the interventions noted above, I reevaluated the patient and found that they have :stayed the same   Social Determinants of Health:  None   Dispostion:  After consideration of the diagnostic results and the patients response to treatment, I feel that the patient would benefit from admission for further diagnosis and treatment.       Final diagnoses:  Symptomatic anemia  Occult blood in stools    ED Discharge Orders     None          Janetta Terrall FALCON, PA-C 04/04/24 2018    Janetta Terrall FALCON, [place] 04/04/24 2346    Garrick Charleston, MD 04/10/24 [phone]

## 2024-04-04 NOTE — H&P (Signed)
 History and Physical    AMIT LEECE FMW:[id number] DOB: 07-Nov-1955 DOA: 04/04/2024  PCP: [name], NP   Patient coming from: Home   Chief Complaint: General weakness, low Hgb   HPI: TORAN MURCH is a 68 y.o. male with medical history significant for COPD, hypertension, PUD, now presenting with generalized weakness and low hemoglobin on outpatient blood work.  Patient was admitted to the hospital last month and found to have a large duodenal bulb ulcer on EGD.  He denies any recent abdominal pain, nausea, vomiting, melena, or hematochezia.  He has been generally weak and fatigued and was sent to the ED for evaluation of low hemoglobin.  ED Course: Upon arrival to the ED, patient is found to be afebrile and saturating well on room air with normal HR and stable BP.  Labs are most notable for creatinine 1.52 and hemoglobin 6.1.  FOBT is positive.  GI was consulted by the ED PA and the patient was given IV Protonix  and 2 units RBC.  Review of Systems:  All other systems reviewed and apart from HPI, are negative.  Past Medical History:  Diagnosis Date   Asthma    COPD (chronic obstructive pulmonary disease) (HCC)    Heart murmur    Hypertension    Sleep apnea     Past Surgical History:  Procedure Laterality Date   COLONOSCOPY     ESOPHAGOGASTRODUODENOSCOPY N/A 03/10/2024   Procedure: EGD (ESOPHAGOGASTRODUODENOSCOPY);  Surgeon: Cinderella Deatrice FALCON, MD;  Location: [facility];  Service: Endoscopy;  Laterality: N/A;   JOINT REPLACEMENT Left    knee   KNEE SURGERY      Social History:   reports that he has been smoking cigarettes and cigars. He has never used smokeless tobacco. He reports current alcohol use. He reports that he does not use drugs.  Allergies  Allergen Reactions   Motrin [Ibuprofen] Anaphylaxis and Swelling    Anti-inflammatory analgesics- cause anaphylaxis    History reviewed. No pertinent family history.   Prior to Admission medications    Medication Sig Start Date End Date Taking? Authorizing Provider  acetaminophen  (TYLENOL ) 500 MG tablet Take 1,000 mg by mouth every 6 (six) hours as needed for mild pain (pain score 1-3).   Yes [provider]  albuterol  (VENTOLIN  HFA) 108 (90 Base) MCG/ACT inhaler Inhale 2 puffs into the lungs every 6 (six) hours as needed for shortness of breath.   Yes [provider]  atorvastatin  (LIPITOR) 40 MG tablet Take 40 mg by mouth daily.   Yes [provider]  cyclobenzaprine  (FLEXERIL ) 10 MG tablet Take 10 mg by mouth at bedtime. 09/17/20  Yes [provider]  diltiazem  (CARDIZEM  CD) 360 MG 24 hr capsule Take 360 mg by mouth daily. 02/16/24  Yes [provider]  feeding supplement, ENSURE COMPLETE, (ENSURE COMPLETE) LIQD Take 237 mLs by mouth 2 (two) times daily between meals. 03/12/24  Yes Tat, Alm, MD  furosemide (LASIX) 20 MG tablet Take 20 mg by mouth in the morning, at noon, and at bedtime. 03/31/24 04/04/24 Yes [provider]  HYDROcodone -acetaminophen  (NORCO) 10-325 MG tablet Take 1 tablet by mouth every 4 (four) hours as needed for moderate pain (pain score 4-6). 02/10/24  Yes [provider]  pantoprazole  (PROTONIX ) 40 MG tablet Take 1 tablet (40 mg total) by mouth 2 (two) times daily. 03/12/24  Yes TatAlm, MD  predniSONE  (DELTASONE ) 50 MG tablet Take 50 mg by mouth daily with breakfast. 03/31/24 04/05/24  Yes [provider]  rOPINIRole  (REQUIP ) 1 MG tablet Take 1 mg by mouth daily. 02/16/24  Yes [provider]  terazosin (HYTRIN) 10 MG capsule Take 10 mg by mouth daily. 02/16/24  Yes [provider]  traZODone  (DESYREL ) 150 MG tablet Take 150 mg by mouth at bedtime as needed for sleep. 02/16/24  Yes [provider]  umeclidinium bromide  (INCRUSE ELLIPTA ) 62.5 MCG/ACT AEPB Inhale 1 puff into the lungs daily. 06/23/22  Yes Bryn Bernardino NOVAK, MD    Physical Exam: Vitals:   04/04/24 1915 04/04/24 1935  04/04/24 2030 04/04/24 2035  BP: 131/74 (!) 156/84 (!) 173/87 (!) 153/82  Pulse: 68 66 62 61  Resp: (!) 23 (!) 22 18 15   Temp:      TempSrc:      SpO2: 98% 96% 98% 96%  Weight:      Height:         Constitutional: NAD, no pallor or diaphoresis   Eyes: PERTLA, lids and conjunctivae normal ENMT: Mucous membranes are moist. Posterior pharynx clear of any exudate or lesions.   Neck: supple, no masses  Respiratory: no wheezing, no crackles. No accessory muscle use.  Cardiovascular: S1 & S2 heard, regular rate and rhythm. Pretibial pitting edema.   Abdomen: No tenderness, soft. Bowel sounds active.  Musculoskeletal: no clubbing / cyanosis. No joint deformity upper and lower extremities.   Skin: no significant rashes, lesions, ulcers. Warm, dry, well-perfused. Neurologic: CN 2-12 grossly intact. Moving all extremities. Alert and oriented.  Psychiatric: Calm. Cooperative.    Labs and Imaging on Admission: I have personally reviewed following labs and imaging studies  CBC: Recent Labs  Lab 04/04/24 1750  WBC 8.0  NEUTROABS 6.5  HGB 6.1*  HCT 19.5*  MCV 85.9  PLT 489*   Basic Metabolic Panel: Recent Labs  Lab 04/04/24 1750  NA 139  K 3.9  CL 100  CO2 26  GLUCOSE 120*  BUN 25*  CREATININE 1.52*  CALCIUM  8.6*   GFR: Estimated Creatinine Clearance: 48.7 mL/min (A) (by C-G formula based on SCr of 1.52 mg/dL (H)). Liver Function Tests: Recent Labs  Lab 04/04/24 1750  AST 17  ALT 12  ALKPHOS 84  BILITOT 0.4  PROT 6.2*  ALBUMIN 3.2*   No results for input(s): LIPASE, AMYLASE in the last 168 hours. No results for input(s): AMMONIA in the last 168 hours. Coagulation Profile: No results for input(s): INR, PROTIME in the last 168 hours. Cardiac Enzymes: No results for input(s): CKTOTAL, CKMB, CKMBINDEX, TROPONINI in the last 168 hours. BNP (last 3 results) No results for input(s): PROBNP in the last 8760 hours. HbA1C: No results for input(s):  HGBA1C in the last 72 hours. CBG: No results for input(s): GLUCAP in the last 168 hours. Lipid Profile: No results for input(s): CHOL, HDL, LDLCALC, TRIG, CHOLHDL, LDLDIRECT in the last 72 hours. Thyroid Function Tests: No results for input(s): TSH, T4TOTAL, FREET4, T3FREE, THYROIDAB in the last 72 hours. Anemia Panel: No results for input(s): VITAMINB12, FOLATE, FERRITIN, TIBC, IRON, RETICCTPCT in the last 72 hours. Urine analysis:    Component Value Date/Time   COLORURINE YELLOW 03/09/2024 1520   APPEARANCEUR CLEAR 03/09/2024 1520   LABSPEC 1.021 03/09/2024 1520   PHURINE 5.0 03/09/2024 1520   GLUCOSEU NEGATIVE 03/09/2024 1520   HGBUR NEGATIVE 03/09/2024 1520   BILIRUBINUR NEGATIVE 03/09/2024 1520   KETONESUR NEGATIVE 03/09/2024 1520   PROTEINUR 30 (A) 03/09/2024 1520   NITRITE NEGATIVE 03/09/2024 1520   LEUKOCYTESUR NEGATIVE 03/09/2024  1520   Sepsis Labs: @LABRCNTIP (procalcitonin:4,lacticidven:4) )No results found for this or any previous visit (from the past 240 hours).   Radiological Exams on Admission: No results found.  Assessment/Plan   1. GI bleeding; symptomatic anemia  - Continue PPI and bowel rest, trend H&H    2. AKI  - Hold Lasix, renally-dose medications, repeat chem panel in am   3. COPD  - Not in exacerbation  - Bronchodilators as needed    4. Hypertension  - Diltiazem      DVT prophylaxis: SCDs  Code Status: Full  Level of Care: Level of care: Telemetry Family Communication: none present  Disposition Plan:  Patient is from: Home  Anticipated d/c is to: Home  Anticipated d/c date is: 04/07/24  Patient currently: Pending GI consultation, stable H&H  Consults called: GI  Admission status: Inpatient     Evalene GORMAN Sprinkles, MD [facility]  04/04/2024, 9:02 PM

## 2024-04-05 ENCOUNTER — Inpatient Hospital Stay (HOSPITAL_COMMUNITY)

## 2024-04-05 DIAGNOSIS — R0602 Shortness of breath: Secondary | ICD-10-CM | POA: Diagnosis not present

## 2024-04-05 DIAGNOSIS — J449 Chronic obstructive pulmonary disease, unspecified: Secondary | ICD-10-CM | POA: Diagnosis not present

## 2024-04-05 DIAGNOSIS — N179 Acute kidney failure, unspecified: Secondary | ICD-10-CM | POA: Diagnosis not present

## 2024-04-05 DIAGNOSIS — I5031 Acute diastolic (congestive) heart failure: Secondary | ICD-10-CM

## 2024-04-05 DIAGNOSIS — R918 Other nonspecific abnormal finding of lung field: Secondary | ICD-10-CM

## 2024-04-05 DIAGNOSIS — R609 Edema, unspecified: Secondary | ICD-10-CM

## 2024-04-05 DIAGNOSIS — D649 Anemia, unspecified: Secondary | ICD-10-CM | POA: Diagnosis not present

## 2024-04-05 DIAGNOSIS — I10 Essential (primary) hypertension: Secondary | ICD-10-CM | POA: Diagnosis not present

## 2024-04-05 DIAGNOSIS — F1090 Alcohol use, unspecified, uncomplicated: Secondary | ICD-10-CM

## 2024-04-05 DIAGNOSIS — R011 Cardiac murmur, unspecified: Secondary | ICD-10-CM | POA: Diagnosis not present

## 2024-04-05 DIAGNOSIS — Z8711 Personal history of peptic ulcer disease: Secondary | ICD-10-CM

## 2024-04-05 LAB — BASIC METABOLIC PANEL WITH GFR
Anion gap: 11 (ref 5–15)
BUN: 27 mg/dL — ABNORMAL HIGH (ref 8–23)
CO2: 27 mmol/L (ref 22–32)
Calcium: 8.4 mg/dL — ABNORMAL LOW (ref 8.9–10.3)
Chloride: 101 mmol/L (ref 98–111)
Creatinine, Ser: 1.34 mg/dL — ABNORMAL HIGH (ref 0.61–1.24)
GFR, Estimated: 58 mL/min — ABNORMAL LOW (ref >60)
Glucose, Bld: 105 mg/dL — ABNORMAL HIGH (ref 70–99)
Potassium: 3.8 mmol/L (ref 3.5–5.1)
Sodium: 139 mmol/L (ref 135–145)

## 2024-04-05 LAB — ECHOCARDIOGRAM COMPLETE
AR max vel: 2.15 cm2
AV Area VTI: 2.27 cm2
AV Area mean vel: 2.1 cm2
AV Mean grad: 11 mmHg
AV Peak grad: 21.8 mmHg
Ao pk vel: 2.33 m/s
Area-P 1/2: 3.17 cm2
Height: 70 in
S' Lateral: 3.1 cm
Weight: 3262.81 [oz_av]

## 2024-04-05 LAB — CBC
HCT: 23.2 % — ABNORMAL LOW (ref 39.0–52.0)
HCT: 23.9 % — ABNORMAL LOW (ref 39.0–52.0)
HCT: 25.4 % — ABNORMAL LOW (ref 39.0–52.0)
Hemoglobin: 7.3 g/dL — ABNORMAL LOW (ref 13.0–17.0)
Hemoglobin: 7.6 g/dL — ABNORMAL LOW (ref 13.0–17.0)
Hemoglobin: 7.8 g/dL — ABNORMAL LOW (ref 13.0–17.0)
MCH: 27.1 pg (ref 26.0–34.0)
MCH: 27.4 pg (ref 26.0–34.0)
MCH: 26.4 pg (ref 26.0–34.0)
MCHC: 31.5 g/dL (ref 30.0–36.0)
MCHC: 31.8 g/dL (ref 30.0–36.0)
MCHC: 30.7 g/dL (ref 30.0–36.0)
MCV: 86.2 fL (ref 80.0–100.0)
MCV: 86.3 fL (ref 80.0–100.0)
MCV: 86.1 fL (ref 80.0–100.0)
Platelets: 434 10*3/uL — ABNORMAL HIGH (ref 150–400)
Platelets: 408 10*3/uL — ABNORMAL HIGH (ref 150–400)
Platelets: 431 10*3/uL — ABNORMAL HIGH (ref 150–400)
RBC: 2.69 MIL/uL — ABNORMAL LOW (ref 4.22–5.81)
RBC: 2.77 MIL/uL — ABNORMAL LOW (ref 4.22–5.81)
RBC: 2.95 MIL/uL — ABNORMAL LOW (ref 4.22–5.81)
RDW: 17.3 % — ABNORMAL HIGH (ref 11.5–15.5)
RDW: 17.5 % — ABNORMAL HIGH (ref 11.5–15.5)
RDW: 17.4 % — ABNORMAL HIGH (ref 11.5–15.5)
WBC: 9.4 10*3/uL (ref 4.0–10.5)
WBC: 9.3 10*3/uL (ref 4.0–10.5)
WBC: 8.3 10*3/uL (ref 4.0–10.5)
nRBC: 0.4 % — ABNORMAL HIGH (ref 0.0–0.2)
nRBC: 0.3 % — ABNORMAL HIGH (ref 0.0–0.2)
nRBC: 0.2 % (ref 0.0–0.2)

## 2024-04-05 LAB — BPAM RBC
Blood Product Expiration Date: 202507152359
Blood Product Expiration Date: 202507152359
ISSUE DATE / TIME: 202506231848
ISSUE DATE / TIME: 202506232200
Unit Type and Rh: 5100
Unit Type and Rh: 5100

## 2024-04-05 LAB — TYPE AND SCREEN
ABO/RH(D): O POS
Antibody Screen: NEGATIVE
Unit division: 0
Unit division: 0

## 2024-04-05 MED ORDER — SUCRALFATE 1 GM/10ML PO SUSP
1.0000 g | Freq: Three times a day (TID) | ORAL | Status: DC
Start: 2024-04-05 — End: 2024-04-07
  Administered 2024-04-05 – 2024-04-07 (×5): 1 g via ORAL
  Filled 2024-04-05 (×5): qty 10

## 2024-04-05 MED ORDER — HYDRALAZINE HCL 25 MG PO TABS
25.0000 mg | ORAL_TABLET | Freq: Three times a day (TID) | ORAL | Status: DC | PRN
  Administered 2024-04-05: 25 mg via ORAL
  Filled 2024-04-05: qty 1

## 2024-04-05 MED ORDER — FUROSEMIDE 10 MG/ML IJ SOLN
40.0000 mg | Freq: Once | INTRAMUSCULAR | Status: AC
  Administered 2024-04-05: 40 mg via INTRAVENOUS
  Filled 2024-04-05: qty 4

## 2024-04-05 NOTE — Plan of Care (Signed)

## 2024-04-05 NOTE — Consult Note (Signed)
 @LOGO @   Referring Provider: [facility] Primary Care Physician:  [name], NP Primary Gastroenterologist:  Dr. Eartha   Date of Admission: 04/04/24 Date of Consultation: 04/05/24  Reason for Consultation:  Anemia  HPI:  David Callahan is a 68 y.o. year old male with history of asthma, COPD, HTN, HLD, chronic anemia,  recent admission with acute on chronic anemia in May receiving 2 units PRBCs and found to have large duodenal ulcer with non-bleeding visible vessel on EGD 03/10/2024, now presenting with worsening anemia with need for blood transfusion at the request of his PCP, associated SOB and fatigue.   At last hospital discharge on 5/31, hemoglobin was 7.5.  ED course: Hemodynamically stable.  Hypertensive. Labs remarkable for hemoglobin 6.1, FOBT positive, creatinine 1.52, BUN 25  Received 2 units PRBCs with hemoglobin improved to 7.6 today. Started on IV PPI BID.   GI consulted for anemia.   Chest x-ray today with chronic coarsened interstitial markings with diffuse bronchial wall thickening, mild increase peripheral septal markings within the lower lung zones which may reflect mild interstitial edema.   Consult:  Fatigue and worsening shortness of breath the last [redacted] weeks along with progressive fatigue.  Only able to walk a short distance without stopping to take a break.  Denies BRBPR, melena, change in bowels, nausea, vomiting, GERD, dysphagia.  Reports he has been compliant with pantoprazole  40 mg twice a day since discharge.  He has not taken any Aleve .  States he is only taken about 4 Tylenol  since his discharge.  No aspirin powders.  No alcohol since discharge.  Reports he used to drink about 1 quart of moonshine per week for the last 20+ years.  No known history of liver problems.  States he is also been dealing with some swelling in his legs recently.  He saw his primary care doctor last week and was prescribed prednisone  50 mg daily for 5 days and also  prescribed Lasix 20 mg 3 times daily for 3 days.  Reports swelling is still there, but did not improve some with Lasix.  Notes that his shortness of breath got worse after receiving 1 unit PRBCs yesterday.  Required supplemental oxygen  last night, but none today.  States his breathing is worse when laying back in bed.   Was prescribed prednisone  50 mg daily for 5 days on 6/19 for leg swelling along with lasix 20 mg TID for 3 days.   1 quart of moonshine per week for the last 20 years until May. No alcohol since May.   No chest pain.  Not sure if he has ever had ECHO.   EGD 03/10/2024: Grade a esophagitis, gastritis, nonobstructing large duodenal bulb ulcer with a nonbleeding visible vessel.  Suspected NSAID induced etiology.  Area was injected with epinephrine  and treated with bipolar cautery.  Recommended if any significant bleeding, obtain IR consult stat for possible GDA embolization.  Consider repeat EGD in 2 to 3 months after PPI to assess healing of this large lesion.  Last colonoscopy: March 2025, [place], normal per patient, repeat in 10 years   Past Medical History:  Diagnosis Date   Asthma    COPD (chronic obstructive pulmonary disease) (HCC)    Heart murmur    Hypertension    Sleep apnea     Past Surgical History:  Procedure Laterality Date   COLONOSCOPY     ESOPHAGOGASTRODUODENOSCOPY N/A 03/10/2024   Procedure: EGD (ESOPHAGOGASTRODUODENOSCOPY);  Surgeon: Cinderella Deatrice FALCON, MD;  Location: [facility];  Service: Endoscopy;  Laterality: N/A;   JOINT REPLACEMENT Left    knee   KNEE SURGERY      Prior to Admission medications   Medication Sig Start Date End Date Taking? Authorizing Provider  acetaminophen  (TYLENOL ) 500 MG tablet Take 1,000 mg by mouth every 6 (six) hours as needed for mild pain (pain score 1-3).   Yes [provider]  albuterol  (VENTOLIN  HFA) 108 (90 Base) MCG/ACT inhaler Inhale 2 puffs into the lungs every 6 (six) hours as needed for shortness  of breath.   Yes [provider]  atorvastatin  (LIPITOR) 40 MG tablet Take 40 mg by mouth daily.   Yes [provider]  cyclobenzaprine  (FLEXERIL ) 10 MG tablet Take 10 mg by mouth at bedtime. 09/17/20  Yes [provider]  diltiazem  (CARDIZEM  CD) 360 MG 24 hr capsule Take 360 mg by mouth daily. 02/16/24  Yes [provider]  feeding supplement, ENSURE COMPLETE, (ENSURE COMPLETE) LIQD Take 237 mLs by mouth 2 (two) times daily between meals. 03/12/24  Yes Tat, Alm, MD  HYDROcodone -acetaminophen  (NORCO) 10-325 MG tablet Take 1 tablet by mouth every 4 (four) hours as needed for moderate pain (pain score 4-6). 02/10/24  Yes [provider]  pantoprazole  (PROTONIX ) 40 MG tablet Take 1 tablet (40 mg total) by mouth 2 (two) times daily. 03/12/24  Yes TatAlm, MD  predniSONE  (DELTASONE ) 50 MG tablet Take 50 mg by mouth daily with breakfast. 03/31/24 04/05/24 Yes [provider]  rOPINIRole  (REQUIP ) 1 MG tablet Take 1 mg by mouth daily. 02/16/24  Yes [provider]  terazosin (HYTRIN) 10 MG capsule Take 10 mg by mouth daily. 02/16/24  Yes [provider]  traZODone  (DESYREL ) 150 MG tablet Take 150 mg by mouth at bedtime as needed for sleep. 02/16/24  Yes [provider]  umeclidinium bromide  (INCRUSE ELLIPTA ) 62.5 MCG/ACT AEPB Inhale 1 puff into the lungs daily. 06/23/22  Yes Bryn Bernardino NOVAK, MD    Current Facility-Administered Medications  Medication Dose Route Frequency Provider Last Rate Last Admin   acetaminophen  (TYLENOL ) tablet 650 mg  650 mg Oral Q6H PRN Opyd, Timothy S, MD       Or   acetaminophen  (TYLENOL ) suppository 650 mg  650 mg Rectal Q6H PRN Opyd, Timothy S, MD       albuterol  (PROVENTIL ) (2.5 MG/3ML) 0.083% nebulizer solution 2.5 mg  2.5 mg Inhalation Q6H PRN Opyd, Timothy S, MD   2.5 mg at 04/05/24 0411   atorvastatin  (LIPITOR) tablet 40 mg  40 mg Oral Daily Opyd, Timothy S, MD   40 mg at 04/05/24 9089   cyclobenzaprine   (FLEXERIL ) tablet 10 mg  10 mg Oral QHS Opyd, Timothy S, MD   10 mg at 04/04/24 2222   diltiazem  (CARDIZEM  CD) 24 hr capsule 360 mg  360 mg Oral Daily Opyd, Timothy S, MD   360 mg at 04/05/24 0910   hydrALAZINE  (APRESOLINE ) tablet 25 mg  25 mg Oral Q8H PRN Opyd, Timothy S, MD   25 mg at 04/05/24 9471   oxyCODONE (Oxy IR/ROXICODONE) immediate release tablet 5-10 mg  5-10 mg Oral Q4H PRN Opyd, Timothy S, MD   5 mg at 04/05/24 0631   pantoprazole  (PROTONIX ) injection 40 mg  40 mg Intravenous Q12H Opyd, Timothy S, MD   40 mg at 04/05/24 0910   prochlorperazine (COMPAZINE) injection 5 mg  5 mg Intravenous Q6H PRN Opyd, Evalene RAMAN, MD       rOPINIRole  (REQUIP ) tablet  1 mg  1 mg Oral QHS Opyd, Timothy S, MD   1 mg at 04/04/24 2222   sodium chloride  flush (NS) 0.9 % injection 3 mL  3 mL Intravenous Q12H Opyd, Timothy S, MD   3 mL at 04/05/24 0911   terazosin (HYTRIN) capsule 10 mg  10 mg Oral Daily Opyd, Timothy S, MD   10 mg at 04/05/24 0910   traZODone  (DESYREL ) tablet 150 mg  150 mg Oral QHS PRN OpydEvalene RAMAN, MD        Allergies as of 04/04/2024 - Review Complete 04/04/2024  Allergen Reaction Noted   Motrin [ibuprofen] Anaphylaxis and Swelling 10/07/2020    History reviewed. No pertinent family history.  Social History   Socioeconomic History   Marital status: Divorced    Spouse name: Not on file   Number of children: Not on file   Years of education: Not on file   Highest education level: Not on file  Occupational History   Not on file  Tobacco Use   Smoking status: Every Day    Current packs/day: 0.50    Types: Cigarettes, Cigars   Smokeless tobacco: Never  Vaping Use   Vaping status: Never Used  Substance and Sexual Activity   Alcohol use: Yes    Comment: ocassional homemade moonshine per pt   Drug use: Never   Sexual activity: Not on file  Other Topics Concern   Not on file  Social History Narrative   Not on file   Social Drivers of Health   Financial Resource Strain:  Not on file  Food Insecurity: No Food Insecurity (04/04/2024)   Hunger Vital Sign    Worried About Running Out of Food in the Last Year: Never true    Ran Out of Food in the Last Year: Never true  Transportation Needs: No Transportation Needs (04/04/2024)   PRAPARE - Administrator, Civil Service (Medical): No    Lack of Transportation (Non-Medical): No  Physical Activity: Not on file  Stress: Not on file  Social Connections: Socially Isolated (04/04/2024)   Social Connection and Isolation Panel    Frequency of Communication with Friends and Family: Twice a week    Frequency of Social Gatherings with Friends and Family: Twice a week    Attends Religious Services: Never    Database administrator or Organizations: No    Attends Banker Meetings: Never    Marital Status: Divorced  Catering manager Violence: Not At Risk (04/04/2024)   Humiliation, Afraid, Rape, and Kick questionnaire    Fear of Current or Ex-Partner: No    Emotionally Abused: No    Physically Abused: No    Sexually Abused: No    Review of Systems: Gen: Denies fever, chills, cold or flulike symptoms. CV: Denies chest pain, heart palpitations. Resp: Admits to shortness of breath and cough. GI: See HPI. GU : Denies urinary burning, urinary frequency, urinary incontinence.  MS: Denies joint pain. Derm: Denies rash. Psych: Denies depression, anxiety. Heme: See HPI  Physical Exam: Vital signs in last 24 hours: Temp:  [97.9 F (36.6 C)-98.5 F (36.9 C)] 97.9 F (36.6 C) (06/24 0508) Pulse Rate:  [50-81] 50 (06/24 0508) Resp:  [15-23] 16 (06/24 0508) BP: (131-186)/(66-101) 172/78 (06/24 0629) SpO2:  [94 %-100 %] 100 % (06/24 0508) Weight:  [83.1 kg-92.5 kg] 92.5 kg (06/23 2136) Last BM Date : 04/03/24 General:   Alert,  Well-developed, well-nourished, pleasant and cooperative in NAD Head:  Normocephalic and atraumatic. Eyes:  Sclera clear, no icterus.   Conjunctiva pink. Ears:  Normal  auditory acuity. Nose:  No deformity, discharge,  or lesions. Lungs: Diffuse rhonchi, slight wheezing appreciated anteriorly.  No crackles.  No acute distress, patient did sit up on bedside due to shortness of breath when laying down.  Heart:  Regular rate and rhythm.  Mild systolic murmur appreciated. Abdomen: Full, soft, very mild TTP in suprapubic region. No masses, hepatosplenomegaly or hernias noted. Normal bowel sounds, without guarding, and without rebound.   Rectal:  Deferred  Msk:  Symmetrical without gross deformities. Normal posture. Extremities:  With pitting edema into his thighs. Neurologic:  Alert and  oriented x4;  grossly normal neurologically. Skin: Venous stasis changes in the lower extremities. Psych:  Alert and cooperative. Normal mood and affect.  Intake/Output from previous day: 06/23 0701 - 06/24 0700 In: 953.8 [I.V.:306.8; Blood:647] Out: -  Intake/Output this shift: No intake/output data recorded.  Lab Results: Recent Labs    04/04/24 1750 04/05/24 0143 04/05/24 0845  WBC 8.0 9.4 9.3  HGB 6.1* 7.3* 7.6*  HCT 19.5* 23.2* 23.9*  PLT 489* 434* 408*   BMET Recent Labs    04/04/24 1750 04/05/24 0143  NA 139 139  K 3.9 3.8  CL 100 101  CO2 26 27  GLUCOSE 120* 105*  BUN 25* 27*  CREATININE 1.52* 1.34*  CALCIUM  8.6* 8.4*   LFT Recent Labs    04/04/24 1750  PROT 6.2*  ALBUMIN 3.2*  AST 17  ALT 12  ALKPHOS 84  BILITOT 0.4   Studies/Results: DG CHEST PORT 1 VIEW Result Date: 04/05/2024 CLINICAL DATA:  Dyspnea. EXAM: PORTABLE CHEST 1 VIEW COMPARISON:  03/09/2024 FINDINGS: Stable cardiomediastinal contours. Aortic atherosclerotic calcifications. Chronic coarsened interstitial markings noted bilaterally with diffuse bronchial wall thickening. Mild increased peripheral septal markings identified within the lower lung zones. No significant pleural effusion. No signs of airspace consolidation. 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 IMPRESSION: 1. Chronic coarsened interstitial markings with diffuse bronchial wall thickening. 2. Mild increased peripheral septal markings within the lower lung zones may reflect mild interstitial edema. Electronically Signed   By: Waddell Calk M.D.   On: 04/05/2024 07:00    Impression: 68 y.o. year old male with history of asthma, COPD, HTN, HLD, chronic anemia,  recent admission with acute on chronic anemia in May receiving 2 units PRBCs and found to have large duodenal ulcer with non-bleeding visible vessel on EGD 03/10/2024, now presenting with worsening anemia with need for blood transfusion at the request of his PCP, associated SOB and fatigue.   Acute on chronic symptomatic anemia:  Hgb 6.1 on admission, down from 7.5 at the time of recent hospital discharge on 03/12/2024.  Received 2 units PRBCs with Hgb improved to 7.3.  There may have been some hemodilution in the setting of IV fluids received overnight.  Denies overt GI bleeding.  Reports compliant with PPI twice daily.  Reports discontinuing NSAIDs at the time of last hospital discharge along with alcohol use.  Suspect patient is likely oozing from large duodenal ulcer.  Reports recent outpatient colonoscopy in March 2025 with [facility] with normal exam.  Would consider repeating EGD to reevaluate large duodenal ulcer/ visible vessel.  Will discuss with Dr. Castaneda.  Timing to be determined pending ECHO findings in light of SOB and peripheral edema.   SOB:  Likely multifactorial in the setting of acute on chronic anemia, COPD, and query undiagnosed CHF in the  setting of peripheral edema.  Chest x-ray today with possible mild interstitial edema.  Hospitalist has ordered a dose of lasix for today. Recommended ECHO for further evaluation.   Peripheral edema:  New onset in the last couple of weeks. PCP gave him a course of lasix outpatient last week x 3 days. Now with mild AKI. Some improvement in Cr today to 1.34 from 1.52.  Hospitalist has ordered dose of IV lasix for today due to SOB and peripheral edema. I have recommended ECHO to evaluate for CHF and US  to rule out underlying liver disease in the setting of chronic alcohol use.   Systolic murmer:  Needs ECHO.    Plan: Clear liquid diet today.  Monitor H/H and for overt GI bleeding.  Transfuse as needed.  IV PPI BID Carafate 1g QID ECHO Abdominal US  Would consider EGD to re-evaluate large duodenal ulcer with visible vessel. Will discuss with Dr. Castaneda.    LOS: 1 day    04/05/2024, 1:29 PM   [facility], [facility]

## 2024-04-05 NOTE — Progress Notes (Addendum)
  Progress Note   Patient: David Callahan FMW:[id number] DOB: [birth year] DOA: 04/04/2024     1 DOS: the patient was seen and examined on 04/05/2024 at 7:31AM      Brief hospital course: 68 y.o. M with HTN, COPD, and recently admitted for melena, found to have PUD and duodenal ulcer, who returns for persistent anemia.  Went to his PCP for routine follow up, found to have anemia again, sent to the ER.  Denies any recent melena, hematemesis.       Assessment and Plan: Anemia Peptic ulcer disease Denies any observed blood loss, melena.  No ferrous sulfate prescription at last discharge, patient not taking. No recent iron indices.  Normocytic but RDW elevated.  BUN normal.  Transfused 2u PRBCs on admission 6/23, Hgb up to 7.5 after. - Continue IV PPI - Trend CBC - CLD - Consult GI re: value of repeat scope - Check iron studies   Acute kidney injury Cr 1.5 on admission from baseline 1.0-1.2.  Improved overnight with IV fluids - Stop IV fluids  COPD  Just finished a course of prednisone  for COPD.  Still slightly wheezy - Albtuerol PRN - Hold Incruse  Hypertension BP elevated - Continue diltiazem  and terazosin - PRN hydralazine  for severe range pressures  Hyperlipidemia - Continue Lipitor  Hypoxia Overnight after his blood transfusions he developed some dyspnea, was placed on O2.  He has increased intersitital markings on CXR as well. - IV Lasix x1 - Obtain echo - Wean O2 - Stop IVF       Subjective: Patient denies hematemesis, vomiting, melena, hematochezia.  No significant shortness of breath or cough.     Physical Exam: BP (!) 172/78 (BP Location: Right Arm)   Pulse (!) 50   Temp 97.9 F (36.6 C) (Oral)   Resp 16   Ht 5' 10 (1.778 m)   Wt 92.5 kg   SpO2 100%   BMI 29.26 kg/m   Adult male, sitting up in bed, interactive and appropriate Slow, regular, no murmurs, no peripheral edema, chronic venous stasis change to both LEs Lung sounds slightly  diminished, scant rhonchi bilaterally, no wheezing or rales Abdomen soft, no tenderness palpation or guarding Attention normal, affect normal, judgment insight appear normal, face symmetric, speech fluent    Data Reviewed: Discussed with GI Creatinine down to 1.3, BUN 27, minimally elevated CBC stable at 7.3-7.6     Family Communication:     Disposition: Status is: Inpatient The patient was admitted with abnormal labs, anemia  He will be evaluated by GI for consideration of repeat endoscopy, versus medical treatment   Check iron studies and start oral iron        Author: Lonni SHAUNNA Dalton, MD 04/05/2024 1:20 PM  For on call review [email or web address].

## 2024-04-05 NOTE — H&P (View-Only) (Signed)
 @LOGO @   Referring Provider: [facility] hospitalist Primary Care Physician:  [name], NP Primary Gastroenterologist:  Dr. Eartha   Date of Admission: 04/04/24 Date of Consultation: 04/05/24  Reason for Consultation:  Anemia  HPI:  David Callahan is a 68 y.o. year old male with history of asthma, COPD, HTN, HLD, chronic anemia,  recent admission with acute on chronic anemia in May receiving 2 units PRBCs and found to have large duodenal ulcer with non-bleeding visible vessel on EGD 03/10/2024, now presenting with worsening anemia with need for blood transfusion at the request of his PCP, associated SOB and fatigue.   At last hospital discharge on 5/31, hemoglobin was 7.5.  ED course: Hemodynamically stable.  Hypertensive. Labs remarkable for hemoglobin 6.1, FOBT positive, creatinine 1.52, BUN 25  Received 2 units PRBCs with hemoglobin improved to 7.6 today. Started on IV PPI BID.   GI consulted for anemia.   Chest x-ray today with chronic coarsened interstitial markings with diffuse bronchial wall thickening, mild increase peripheral septal markings within the lower lung zones which may reflect mild interstitial edema.   Consult:  Fatigue and worsening shortness of breath the last [redacted] weeks along with progressive fatigue.  Only able to walk a short distance without stopping to take a break.  Denies BRBPR, melena, change in bowels, nausea, vomiting, GERD, dysphagia.  Reports he has been compliant with pantoprazole  40 mg twice a day since discharge.  He has not taken any Aleve .  States he is only taken about 4 Tylenol  since his discharge.  No aspirin powders.  No alcohol since discharge.  Reports he used to drink about 1 quart of moonshine per week for the last 20+ years.  No known history of liver problems.  States he is also been dealing with some swelling in his legs recently.  He saw his primary care doctor last week and was prescribed prednisone  50 mg daily for 5 days and also  prescribed Lasix 20 mg 3 times daily for 3 days.  Reports swelling is still there, but did not improve some with Lasix.  Notes that his shortness of breath got worse after receiving 1 unit PRBCs yesterday.  Required supplemental oxygen  last night, but none today.  States his breathing is worse when laying back in bed.   Was prescribed prednisone  50 mg daily for 5 days on 6/19 for leg swelling along with lasix 20 mg TID for 3 days.   1 quart of moonshine per week for the last 20 years until May. No alcohol since May.   No chest pain.  Not sure if he has ever had ECHO.   EGD 03/10/2024: Grade a esophagitis, gastritis, nonobstructing large duodenal bulb ulcer with a nonbleeding visible vessel.  Suspected NSAID induced etiology.  Area was injected with epinephrine  and treated with bipolar cautery.  Recommended if any significant bleeding, obtain IR consult stat for possible GDA embolization.  Consider repeat EGD in 2 to 3 months after PPI to assess healing of this large lesion.  Last colonoscopy: March 2025, [place], normal per patient, repeat in 10 years   Past Medical History:  Diagnosis Date   Asthma    COPD (chronic obstructive pulmonary disease) (HCC)    Heart murmur    Hypertension    Sleep apnea     Past Surgical History:  Procedure Laterality Date   COLONOSCOPY     ESOPHAGOGASTRODUODENOSCOPY N/A 03/10/2024   Procedure: EGD (ESOPHAGOGASTRODUODENOSCOPY);  Surgeon: Cinderella Deatrice FALCON, MD;  Location: [facility];  Service: Endoscopy;  Laterality: N/A;   JOINT REPLACEMENT Left    knee   KNEE SURGERY      Prior to Admission medications   Medication Sig Start Date End Date Taking? Authorizing Provider  acetaminophen  (TYLENOL ) 500 MG tablet Take 1,000 mg by mouth every 6 (six) hours as needed for mild pain (pain score 1-3).   Yes [provider]  albuterol  (VENTOLIN  HFA) 108 (90 Base) MCG/ACT inhaler Inhale 2 puffs into the lungs every 6 (six) hours as needed for shortness  of breath.   Yes [provider]  atorvastatin  (LIPITOR) 40 MG tablet Take 40 mg by mouth daily.   Yes [provider]  cyclobenzaprine  (FLEXERIL ) 10 MG tablet Take 10 mg by mouth at bedtime. 09/17/20  Yes [provider]  diltiazem  (CARDIZEM  CD) 360 MG 24 hr capsule Take 360 mg by mouth daily. 02/16/24  Yes [provider]  feeding supplement, ENSURE COMPLETE, (ENSURE COMPLETE) LIQD Take 237 mLs by mouth 2 (two) times daily between meals. 03/12/24  Yes Tat, Alm, MD  HYDROcodone -acetaminophen  (NORCO) 10-325 MG tablet Take 1 tablet by mouth every 4 (four) hours as needed for moderate pain (pain score 4-6). 02/10/24  Yes [provider]  pantoprazole  (PROTONIX ) 40 MG tablet Take 1 tablet (40 mg total) by mouth 2 (two) times daily. 03/12/24  Yes TatAlm, MD  predniSONE  (DELTASONE ) 50 MG tablet Take 50 mg by mouth daily with breakfast. 03/31/24 04/05/24 Yes [provider]  rOPINIRole  (REQUIP ) 1 MG tablet Take 1 mg by mouth daily. 02/16/24  Yes [provider]  terazosin (HYTRIN) 10 MG capsule Take 10 mg by mouth daily. 02/16/24  Yes [provider]  traZODone  (DESYREL ) 150 MG tablet Take 150 mg by mouth at bedtime as needed for sleep. 02/16/24  Yes [provider]  umeclidinium bromide  (INCRUSE ELLIPTA ) 62.5 MCG/ACT AEPB Inhale 1 puff into the lungs daily. 06/23/22  Yes Bryn Bernardino NOVAK, MD    Current Facility-Administered Medications  Medication Dose Route Frequency Provider Last Rate Last Admin   acetaminophen  (TYLENOL ) tablet 650 mg  650 mg Oral Q6H PRN Opyd, Timothy S, MD       Or   acetaminophen  (TYLENOL ) suppository 650 mg  650 mg Rectal Q6H PRN Opyd, Timothy S, MD       albuterol  (PROVENTIL ) (2.5 MG/3ML) 0.083% nebulizer solution 2.5 mg  2.5 mg Inhalation Q6H PRN Opyd, Timothy S, MD   2.5 mg at 04/05/24 0411   atorvastatin  (LIPITOR) tablet 40 mg  40 mg Oral Daily Opyd, Timothy S, MD   40 mg at 04/05/24 9089   cyclobenzaprine   (FLEXERIL ) tablet 10 mg  10 mg Oral QHS Opyd, Timothy S, MD   10 mg at 04/04/24 2222   diltiazem  (CARDIZEM  CD) 24 hr capsule 360 mg  360 mg Oral Daily Opyd, Timothy S, MD   360 mg at 04/05/24 0910   hydrALAZINE  (APRESOLINE ) tablet 25 mg  25 mg Oral Q8H PRN Opyd, Timothy S, MD   25 mg at 04/05/24 9471   oxyCODONE (Oxy IR/ROXICODONE) immediate release tablet 5-10 mg  5-10 mg Oral Q4H PRN Opyd, Timothy S, MD   5 mg at 04/05/24 0631   pantoprazole  (PROTONIX ) injection 40 mg  40 mg Intravenous Q12H Opyd, Timothy S, MD   40 mg at 04/05/24 0910   prochlorperazine (COMPAZINE) injection 5 mg  5 mg Intravenous Q6H PRN Opyd, Evalene RAMAN, MD       rOPINIRole  (REQUIP ) tablet  1 mg  1 mg Oral QHS Opyd, Timothy S, MD   1 mg at 04/04/24 2222   sodium chloride  flush (NS) 0.9 % injection 3 mL  3 mL Intravenous Q12H Opyd, Timothy S, MD   3 mL at 04/05/24 0911   terazosin (HYTRIN) capsule 10 mg  10 mg Oral Daily Opyd, Timothy S, MD   10 mg at 04/05/24 0910   traZODone  (DESYREL ) tablet 150 mg  150 mg Oral QHS PRN OpydEvalene RAMAN, MD        Allergies as of 04/04/2024 - Review Complete 04/04/2024  Allergen Reaction Noted   Motrin [ibuprofen] Anaphylaxis and Swelling 10/07/2020    History reviewed. No pertinent family history.  Social History   Socioeconomic History   Marital status: Divorced    Spouse name: Not on file   Number of children: Not on file   Years of education: Not on file   Highest education level: Not on file  Occupational History   Not on file  Tobacco Use   Smoking status: Every Day    Current packs/day: 0.50    Types: Cigarettes, Cigars   Smokeless tobacco: Never  Vaping Use   Vaping status: Never Used  Substance and Sexual Activity   Alcohol use: Yes    Comment: ocassional homemade moonshine per pt   Drug use: Never   Sexual activity: Not on file  Other Topics Concern   Not on file  Social History Narrative   Not on file   Social Drivers of Health   Financial Resource Strain:  Not on file  Food Insecurity: No Food Insecurity (04/04/2024)   Hunger Vital Sign    Worried About Running Out of Food in the Last Year: Never true    Ran Out of Food in the Last Year: Never true  Transportation Needs: No Transportation Needs (04/04/2024)   PRAPARE - Administrator, Civil Service (Medical): No    Lack of Transportation (Non-Medical): No  Physical Activity: Not on file  Stress: Not on file  Social Connections: Socially Isolated (04/04/2024)   Social Connection and Isolation Panel    Frequency of Communication with Friends and Family: Twice a week    Frequency of Social Gatherings with Friends and Family: Twice a week    Attends Religious Services: Never    Database administrator or Organizations: No    Attends Banker Meetings: Never    Marital Status: Divorced  Catering manager Violence: Not At Risk (04/04/2024)   Humiliation, Afraid, Rape, and Kick questionnaire    Fear of Current or Ex-Partner: No    Emotionally Abused: No    Physically Abused: No    Sexually Abused: No    Review of Systems: Gen: Denies fever, chills, cold or flulike symptoms. CV: Denies chest pain, heart palpitations. Resp: Admits to shortness of breath and cough. GI: See HPI. GU : Denies urinary burning, urinary frequency, urinary incontinence.  MS: Denies joint pain. Derm: Denies rash. Psych: Denies depression, anxiety. Heme: See HPI  Physical Exam: Vital signs in last 24 hours: Temp:  [97.9 F (36.6 C)-98.5 F (36.9 C)] 97.9 F (36.6 C) (06/24 0508) Pulse Rate:  [50-81] 50 (06/24 0508) Resp:  [15-23] 16 (06/24 0508) BP: (131-186)/(66-101) 172/78 (06/24 0629) SpO2:  [94 %-100 %] 100 % (06/24 0508) Weight:  [83.1 kg-92.5 kg] 92.5 kg (06/23 2136) Last BM Date : 04/03/24 General:   Alert,  Well-developed, well-nourished, pleasant and cooperative in NAD Head:  Normocephalic and atraumatic. Eyes:  Sclera clear, no icterus.   Conjunctiva pink. Ears:  Normal  auditory acuity. Nose:  No deformity, discharge,  or lesions. Lungs: Diffuse rhonchi, slight wheezing appreciated anteriorly.  No crackles.  No acute distress, patient did sit up on bedside due to shortness of breath when laying down.  Heart:  Regular rate and rhythm.  Mild systolic murmur appreciated. Abdomen: Full, soft, very mild TTP in suprapubic region. No masses, hepatosplenomegaly or hernias noted. Normal bowel sounds, without guarding, and without rebound.   Rectal:  Deferred  Msk:  Symmetrical without gross deformities. Normal posture. Extremities:  With pitting edema into his thighs. Neurologic:  Alert and  oriented x4;  grossly normal neurologically. Skin: Venous stasis changes in the lower extremities. Psych:  Alert and cooperative. Normal mood and affect.  Intake/Output from previous day: 06/23 0701 - 06/24 0700 In: 953.8 [I.V.:306.8; Blood:647] Out: -  Intake/Output this shift: No intake/output data recorded.  Lab Results: Recent Labs    04/04/24 1750 04/05/24 0143 04/05/24 0845  WBC 8.0 9.4 9.3  HGB 6.1* 7.3* 7.6*  HCT 19.5* 23.2* 23.9*  PLT 489* 434* 408*   BMET Recent Labs    04/04/24 1750 04/05/24 0143  NA 139 139  K 3.9 3.8  CL 100 101  CO2 26 27  GLUCOSE 120* 105*  BUN 25* 27*  CREATININE 1.52* 1.34*  CALCIUM  8.6* 8.4*   LFT Recent Labs    04/04/24 1750  PROT 6.2*  ALBUMIN 3.2*  AST 17  ALT 12  ALKPHOS 84  BILITOT 0.4   Studies/Results: DG CHEST PORT 1 VIEW Result Date: 04/05/2024 CLINICAL DATA:  Dyspnea. EXAM: PORTABLE CHEST 1 VIEW COMPARISON:  03/09/2024 FINDINGS: Stable cardiomediastinal contours. Aortic atherosclerotic calcifications. Chronic coarsened interstitial markings noted bilaterally with diffuse bronchial wall thickening. Mild increased peripheral septal markings identified within the lower lung zones. No significant pleural effusion. No signs of airspace consolidation. 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 IMPRESSION: 1. Chronic coarsened interstitial markings with diffuse bronchial wall thickening. 2. Mild increased peripheral septal markings within the lower lung zones may reflect mild interstitial edema. Electronically Signed   By: Waddell Calk M.D.   On: 04/05/2024 07:00    Impression: 68 y.o. year old male with history of asthma, COPD, HTN, HLD, chronic anemia,  recent admission with acute on chronic anemia in May receiving 2 units PRBCs and found to have large duodenal ulcer with non-bleeding visible vessel on EGD 03/10/2024, now presenting with worsening anemia with need for blood transfusion at the request of his PCP, associated SOB and fatigue.   Acute on chronic symptomatic anemia:  Hgb 6.1 on admission, down from 7.5 at the time of recent hospital discharge on 03/12/2024.  Received 2 units PRBCs with Hgb improved to 7.3.  There may have been some hemodilution in the setting of IV fluids received overnight.  Denies overt GI bleeding.  Reports compliant with PPI twice daily.  Reports discontinuing NSAIDs at the time of last hospital discharge along with alcohol use.  Suspect patient is likely oozing from large duodenal ulcer.  Reports recent outpatient colonoscopy in March 2025 with [facility] with normal exam.  Would consider repeating EGD to reevaluate large duodenal ulcer/ visible vessel.  Will discuss with Dr. Castaneda.  Timing to be determined pending ECHO findings in light of SOB and peripheral edema.   SOB:  Likely multifactorial in the setting of acute on chronic anemia, COPD, and query undiagnosed CHF in the  setting of peripheral edema.  Chest x-ray today with possible mild interstitial edema.  Hospitalist has ordered a dose of lasix for today. Recommended ECHO for further evaluation.   Peripheral edema:  New onset in the last couple of weeks. PCP gave him a course of lasix outpatient last week x 3 days. Now with mild AKI. Some improvement in Cr today to 1.34 from 1.52.  Hospitalist has ordered dose of IV lasix for today due to SOB and peripheral edema. I have recommended ECHO to evaluate for CHF and US  to rule out underlying liver disease in the setting of chronic alcohol use.   Systolic murmer:  Needs ECHO.    Plan: Clear liquid diet today.  Monitor H/H and for overt GI bleeding.  Transfuse as needed.  IV PPI BID Carafate 1g QID ECHO Abdominal US  Would consider EGD to re-evaluate large duodenal ulcer with visible vessel. Will discuss with Dr. Castaneda.    LOS: 1 day    04/05/2024, 1:29 PM   Josette Centers, [facility]

## 2024-04-05 NOTE — TOC Initial Note (Signed)
 Transition of Care [facility]) - Initial/Assessment Note    Patient Details  Name: David Callahan MRN: [id number] Date of Birth: April 01, 1956  [facility]) CM/SW Contact:    Mcarthur Saddie Kim, LCSW Phone Number: 04/05/2024, 11:36 AM  Clinical Narrative: Pt admitted for GI bleed. Assessment completed due to high risk readmission score. Pt reports he lives alone and is independent with ADLs. He ambulates with a cane. Pt drives himself to appointments. He plans to return home when medically stable. No needs reported at this time. TOC will continue to follow.                   Expected Discharge Plan: Home/Self Care Barriers to Discharge: Continued Medical Work up   Patient Goals and CMS Choice Patient states their goals for this hospitalization and ongoing recovery are:: return home     [place] ownership interest in [facility].provided to::  (n/a)    Expected Discharge Plan and Services In-house Referral: Clinical Social Work     Living arrangements for the past 2 months: Single Family Home                                      Prior Living Arrangements/Services Living arrangements for the past 2 months: Single Family Home Lives with:: Self Patient language and need for interpreter reviewed:: Yes Do you feel safe going back to the place where you live?: Yes      Need for Family Participation in Patient Care: No (Comment)   Current home services: DME (cane, walker) Criminal Activity/Legal Involvement Pertinent to Current Situation/Hospitalization: No - Comment as needed  Activities of Daily Living   ADL Screening (condition at time of admission) Independently performs ADLs?: Yes (appropriate for developmental age) Is the patient deaf or have difficulty hearing?: No Does the patient have difficulty seeing, even when wearing glasses/contacts?: No Does the patient have difficulty concentrating, remembering, or making decisions?:  No  Permission Sought/Granted                  Emotional Assessment     Affect (typically observed): Appropriate Orientation: : Oriented to Self, Oriented to Place, Oriented to  Time, Oriented to Situation Alcohol / Substance Use: Not Applicable Psych Involvement: No (comment)  Admission diagnosis:  Occult blood in stools [R19.5] Symptomatic anemia [D64.9] Patient Active Problem List   Diagnosis Date Noted   Essential hypertension 04/04/2024   Duodenal ulcer 03/11/2024   Peptic ulcer hemorrhagic 03/10/2024   Symptomatic anemia 03/10/2024   Acute blood loss anemia 03/09/2024   Melena 03/09/2024   Pleural effusion 06/14/2022   Thrombocytosis 06/05/2022   AKI (acute kidney injury) (HCC) 06/05/2022   Hyperglycemia 06/05/2022   Hyponatremia 06/05/2022   Hypokalemia 06/05/2022   Restless leg syndrome 06/05/2022   Mixed hyperlipidemia 06/05/2022   CAP (community acquired pneumonia) 06/04/2022   Leukocytosis 03/25/2022   COPD with acute bronchitis (HCC) 10/07/2020   COPD (chronic obstructive pulmonary disease) (HCC) 10/07/2020   Bronchitis    Acute respiratory failure with hypoxia (HCC)    PCP:  [name], NP Pharmacy:   [facility] - [place], [place] - [address] [place] [address] Phone: [phone] Fax: [phone]     Social Drivers of Health (SDOH) Social History: SDOH Screenings   Food Insecurity: No Food Insecurity (04/04/2024)  Housing: Low Risk  (04/04/2024)  Transportation Needs: No Transportation Needs (04/04/2024)  Utilities: Not At Risk (04/04/2024)  Social Connections: Socially Isolated (04/04/2024)  Tobacco Use: High Risk (04/04/2024)   SDOH Interventions:     Readmission Risk Interventions    04/05/2024   11:34 AM  Readmission Risk Prevention Plan  Transportation Screening Complete  HRI or Home Care Consult Complete  Social Work Consult for Recovery Care Planning/Counseling Complete   Palliative Care Screening Not Applicable  Medication Review Oceanographer) Complete

## 2024-04-05 NOTE — Progress Notes (Signed)
*  PRELIMINARY RESULTS* Echocardiogram 2D Echocardiogram has been performed.  David Callahan 04/05/2024, 3:58 PM

## 2024-04-05 NOTE — Hospital Course (Signed)
 68 y.o. M with HTN, COPD, and recently admitted for melena, found to have PUD and duodenal ulcer, who returns for persistent anemia.  Went to his PCP for routine follow up, found to have anemia again, sent to the ER.  Denies any recent melena, hematemesis.

## 2024-04-06 ENCOUNTER — Encounter (HOSPITAL_COMMUNITY): Payer: Self-pay | Admitting: Family Medicine

## 2024-04-06 ENCOUNTER — Inpatient Hospital Stay (HOSPITAL_COMMUNITY): Admitting: Certified Registered Nurse Anesthetist

## 2024-04-06 ENCOUNTER — Encounter (HOSPITAL_COMMUNITY)
Admission: EM | Disposition: A | Discharge status: Home / Self Care | Payer: Self-pay | Source: Home / Self Care | Attending: Internal Medicine

## 2024-04-06 ENCOUNTER — Inpatient Hospital Stay (HOSPITAL_COMMUNITY)

## 2024-04-06 DIAGNOSIS — D500 Iron deficiency anemia secondary to blood loss (chronic): Secondary | ICD-10-CM

## 2024-04-06 DIAGNOSIS — I10 Essential (primary) hypertension: Secondary | ICD-10-CM

## 2024-04-06 DIAGNOSIS — F17210 Nicotine dependence, cigarettes, uncomplicated: Secondary | ICD-10-CM

## 2024-04-06 DIAGNOSIS — K5710 Diverticulosis of small intestine without perforation or abscess without bleeding: Secondary | ICD-10-CM | POA: Diagnosis not present

## 2024-04-06 DIAGNOSIS — D649 Anemia, unspecified: Secondary | ICD-10-CM | POA: Diagnosis not present

## 2024-04-06 DIAGNOSIS — E782 Mixed hyperlipidemia: Secondary | ICD-10-CM | POA: Diagnosis not present

## 2024-04-06 HISTORY — PX: ESOPHAGOGASTRODUODENOSCOPY: SHX5428

## 2024-04-06 LAB — CBC
HCT: 27.4 % — ABNORMAL LOW (ref 39.0–52.0)
Hemoglobin: 8.5 g/dL — ABNORMAL LOW (ref 13.0–17.0)
MCH: 26.5 pg (ref 26.0–34.0)
MCHC: 31 g/dL (ref 30.0–36.0)
MCV: 85.4 fL (ref 80.0–100.0)
Platelets: 469 10*3/uL — ABNORMAL HIGH (ref 150–400)
RBC: 3.21 MIL/uL — ABNORMAL LOW (ref 4.22–5.81)
RDW: 17.6 % — ABNORMAL HIGH (ref 11.5–15.5)
WBC: 9 10*3/uL (ref 4.0–10.5)
nRBC: 0 % (ref 0.0–0.2)

## 2024-04-06 LAB — RETICULOCYTES
Immature Retic Fract: 24.7 % — ABNORMAL HIGH (ref 2.3–15.9)
RBC.: 3.22 MIL/uL — ABNORMAL LOW (ref 4.22–5.81)
Retic Count, Absolute: 66 10*3/uL (ref 19.0–186.0)
Retic Ct Pct: 2.1 % (ref 0.4–3.1)

## 2024-04-06 LAB — IRON AND TIBC
Iron: 29 ug/dL — ABNORMAL LOW (ref 45–182)
Saturation Ratios: 8 % — ABNORMAL LOW (ref 17.9–39.5)
TIBC: 357 ug/dL (ref 250–450)
UIBC: 328 ug/dL

## 2024-04-06 LAB — FERRITIN: Ferritin: 11 ng/mL — ABNORMAL LOW (ref 24–336)

## 2024-04-06 LAB — BASIC METABOLIC PANEL WITH GFR
Anion gap: 9 (ref 5–15)
BUN: 17 mg/dL (ref 8–23)
CO2: 30 mmol/L (ref 22–32)
Calcium: 8.4 mg/dL — ABNORMAL LOW (ref 8.9–10.3)
Chloride: 100 mmol/L (ref 98–111)
Creatinine, Ser: 1.07 mg/dL (ref 0.61–1.24)
GFR, Estimated: >60 mL/min (ref >60)
Glucose, Bld: 83 mg/dL (ref 70–99)
Potassium: 3.2 mmol/L — ABNORMAL LOW (ref 3.5–5.1)
Sodium: 139 mmol/L (ref 135–145)

## 2024-04-06 SURGERY — EGD (ESOPHAGOGASTRODUODENOSCOPY)
Anesthesia: General

## 2024-04-06 MED ORDER — STERILE WATER FOR IRRIGATION IR SOLN
Status: DC | PRN
  Administered 2024-04-06 (×2): 60 mL

## 2024-04-06 MED ORDER — LIDOCAINE 2% (20 MG/ML) 5 ML SYRINGE
INTRAMUSCULAR | Status: DC | PRN
  Administered 2024-04-06: 100 mg via INTRAVENOUS

## 2024-04-06 MED ORDER — LACTATED RINGERS IV SOLN: INTRAVENOUS | Status: AC

## 2024-04-06 MED ORDER — LACTATED RINGERS IV SOLN: INTRAVENOUS | Status: DC

## 2024-04-06 MED ORDER — PROPOFOL 10 MG/ML IV BOLUS
INTRAVENOUS | Status: DC | PRN
Start: 2024-04-06 — End: 2024-04-06
  Administered 2024-04-06: 100 mg via INTRAVENOUS

## 2024-04-06 MED ORDER — POTASSIUM CHLORIDE 10 MEQ/100ML IV SOLN
10.0000 meq | INTRAVENOUS | Status: AC
  Administered 2024-04-06 (×2): 10 meq via INTRAVENOUS

## 2024-04-06 MED ORDER — POTASSIUM CHLORIDE 10 MEQ/100ML IV SOLN
10.0000 meq | Freq: Once | INTRAVENOUS | Status: AC
  Administered 2024-04-06: 10 meq via INTRAVENOUS

## 2024-04-06 MED ORDER — POTASSIUM CHLORIDE 10 MEQ/100ML IV SOLN
10.0000 meq | INTRAVENOUS | Status: DC
  Administered 2024-04-06: 10 meq via INTRAVENOUS
  Filled 2024-04-06: qty 100

## 2024-04-06 MED ORDER — LACTATED RINGERS IV SOLN: INTRAVENOUS | Status: DC | PRN

## 2024-04-06 MED ORDER — GLUCAGON HCL RDNA (DIAGNOSTIC) 1 MG IJ SOLR
INTRAMUSCULAR | Status: DC | PRN
  Administered 2024-04-06: .5 mg via INTRAVENOUS

## 2024-04-06 MED ORDER — IRON SUCROSE 200 MG IVPB - SIMPLE MED
200.0000 mg | Freq: Once | Status: AC
  Administered 2024-04-06: 200 mg via INTRAVENOUS
  Filled 2024-04-06: qty 110

## 2024-04-06 MED ORDER — PROPOFOL 500 MG/50ML IV EMUL
INTRAVENOUS | Status: DC | PRN
  Administered 2024-04-06: 200 ug/kg/min via INTRAVENOUS

## 2024-04-06 MED ORDER — SODIUM CHLORIDE 0.9 % IV SOLN: INTRAVENOUS | Status: DC

## 2024-04-06 NOTE — Op Note (Signed)
 [facility] Patient Name: David Callahan Procedure Date: 04/06/2024 3:05 PM MRN: [id number] Date of Birth: 23-Mar-1956 Attending MD: Lamar Ozell Hollingshead , MD, [id number] CSN: [id number] Age: 68 Admit Type: Inpatient Procedure:                Upper GI endoscopy Indications:              Iron deficiency anemia secondary to chronic blood                            loss, Heme positive stool Providers:                Lamar Ozell Hollingshead, MD, Crystal Page, Bascom Blush Referring MD:              Medicines:                Propofol  per Anesthesia Complications:            No immediate complications. Estimated Blood Loss:     Estimated blood loss: none. Procedure:                Pre-Anesthesia Assessment:                           - Prior to the procedure, a History and Physical                            was performed, and patient medications and                            allergies were reviewed. The patient's tolerance of                            previous anesthesia was also reviewed. The risks                            and benefits of the procedure and the sedation                            options and risks were discussed with the patient.                            All questions were answered, and informed consent                            was obtained. Prior Anticoagulants: The patient has                            taken no anticoagulant or antiplatelet agents. ASA                            Grade Assessment: III - A patient with severe                            systemic disease. After reviewing the risks and  benefits, the patient was deemed in satisfactory                            condition to undergo the procedure.                           After obtaining informed consent, the endoscope was                            passed under direct vision. Throughout the                            procedure, the patient's blood pressure, pulse, and                             oxygen  saturations were monitored continuously. The                            [id number]-[id number] ([id number]) scope was introduced through the                            mouth, and advanced to the third part of duodenum.                            The upper GI endoscopy was accomplished without                            difficulty. The patient tolerated the procedure                            well. Scope In: 3:42:47 PM Scope Out: 3:56:01 PM Total Procedure Duration: 0 hours 13 minutes 14 seconds  Findings:      The examined esophagus was normal.      The entire examined stomach was normal. Careful examination of the bulb       second third portion revealed possible scar in the second portion of the       duodenum. An ulcer crater was not identified. Did not see any evidence       of infiltrating process. 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 No blood in the       upper GI tract. Impression:               - Normal esophagus.                           - Normal stomach.                           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 Patient                            tells me he is absolutely refrain taking NSAIDs                            since his hospitalization; remain on twice daily  PPI therapy. It is certainly conceivable that his                            ulcer has healed in this short period of time,                            taking away the insult and treated with twice daily                            PPI therapy. Today's findings are reassuring. He                            did drop his hemoglobin back to 6.5 without overt                            bleeding; he could certainly may have had a NSAID                            insult further downstream. Recent colonoscopy                            report reassuring. Moderate Sedation:      Moderate (conscious) sedation was personally administered by an       anesthesia professional. The  following parameters were monitored: oxygen        saturation, heart rate, blood pressure, respiratory rate, EKG, adequacy       of pulmonary ventilation, and response to care. Recommendation:           - Return patient to hospital ward for observation.                           - Advance diet as tolerated. Continue twice daily                            PPI therapy for now. Would hold up on capsule study                            the small intestine for the time being. Track his                            H&H continue to refrain from taking NSAIDs. Procedure Code(s):        --- Professional ---                           [phone], Esophagogastroduodenoscopy, flexible,                            transoral; diagnostic, including collection of                            specimen(s) by brushing or washing, when performed                            (separate  procedure) Diagnosis Code(s):        --- Professional ---                           D50.0, Iron deficiency anemia secondary to blood                            loss (chronic)                           R19.5, Other fecal abnormalities CPT copyright 2022 American Medical Association. All rights reserved. The codes documented in this report are preliminary and upon coder review may  be revised to meet current compliance requirements. Lamar HERO. Login Muckleroy, MD Lamar Ozell Hollingshead, MD 04/06/2024 4:08:08 PM This report has been signed electronically. Number of Addenda: 0

## 2024-04-06 NOTE — Progress Notes (Addendum)
 PROGRESS NOTE    David Callahan  FMW:[id number] DOB: 1956/09/23 DOA: 04/04/2024 PCP: [name], NP   Brief Narrative:    68 y.o. M with HTN, COPD, and recently admitted for melena, found to have PUD and duodenal ulcer, who returns for persistent anemia.   Went to his PCP for routine follow up, found to have anemia again, sent to the ER.  Denies any recent melena, hematemesis.  He has undergone EGD with no significant findings of bleed and possibly recently healed duodenal ulcer.  Plan to continue to monitor hemoglobin and hematocrit levels and advance diet.  Assessment & Plan:   Principal Problem:   Symptomatic anemia Active Problems:   COPD (chronic obstructive pulmonary disease) (HCC)   AKI (acute kidney injury) (HCC)   Essential hypertension  Assessment and Plan:   Anemia Peptic ulcer disease Denies any observed blood loss, melena.  No ferrous sulfate prescription at last discharge, patient not taking. No recent iron indices.  Normocytic but RDW elevated.  BUN normal.   Transfused 2u PRBCs on admission 6/23, Hgb up to 7.5 after. - Continue IV PPI - Trend CBC - CLD - Noted to be iron deficient and given IV iron -EGD 6/25 with no acute bleeding noted advance diet and monitor H/H     Acute kidney injury-resolved Cr 1.5 on admission from baseline 1.0-1.2.  Improved overnight with IV fluids - Stop IV fluids  Mild hypokalemia -Replete and reevaluate in a.m.   COPD  Just finished a course of prednisone  for COPD.  Still slightly wheezy - Albtuerol PRN - Hold Incruse   Hypertension BP elevated - Continue diltiazem  and terazosin - PRN hydralazine  for severe range pressures   Hyperlipidemia - Continue Lipitor   Hypoxia Overnight after his blood transfusions he developed some dyspnea, was placed on O2.  He has increased intersitital markings on CXR as well. - IV Lasix x1 - Obtain echo - Wean O2 - Stop IVF      DVT prophylaxis: SCDs Code Status:  Full Family Communication: None at bedside Disposition Plan:  Status is: Inpatient Remains inpatient appropriate because: Need for close monitoring and IV medications.   Consultants:  GI  Procedures:  EGD 6/25  Antimicrobials:  None   Subjective: Patient seen and evaluated today with no new acute complaints or concerns. No acute concerns or events noted overnight.  Awaiting EGD.  Objective: Vitals:   04/06/24 1358 04/06/24 1400 04/06/24 1559 04/06/24 1600  BP:  137/69 113/68 113/71  Pulse: 62  62 69  Resp: 17  15 15   Temp: 97.9 F (36.6 C)  97.6 F (36.4 C)   TempSrc: Oral     SpO2: 93% 93% 98% 96%  Weight:  84.8 kg    Height: 5' 10 (1.778 m) 5' 10 (1.778 m)      Intake/Output Summary (Last 24 hours) at 04/06/2024 1613 Last data filed at 04/06/2024 1543 Gross per 24 hour  Intake 800 ml  Output --  Net 800 ml   Filed Weights   04/04/24 1714 04/04/24 2136 04/06/24 1400  Weight: 83.1 kg 92.5 kg 84.8 kg    Examination:  General exam: Appears calm and comfortable  Respiratory system: Clear to auscultation. Respiratory effort normal. Cardiovascular system: S1 & S2 heard, RRR.  Gastrointestinal system: Abdomen is soft Central nervous system: Alert and awake Extremities: No edema Skin: No significant lesions noted Psychiatry: Flat affect.    Data Reviewed: I have personally reviewed following labs and imaging studies  CBC: Recent Labs  Lab 04/04/24 1750 04/05/24 0143 04/05/24 0845 04/05/24 1534 04/06/24 0404  WBC 8.0 9.4 9.3 8.3 9.0  NEUTROABS 6.5  --   --   --   --   HGB 6.1* 7.3* 7.6* 7.8* 8.5*  HCT 19.5* 23.2* 23.9* 25.4* 27.4*  MCV 85.9 86.2 86.3 86.1 85.4  PLT 489* 434* 408* 431* 469*   Basic Metabolic Panel: Recent Labs  Lab 04/04/24 1750 04/05/24 0143 04/06/24 0404  NA 139 139 139  K 3.9 3.8 3.2*  CL 100 101 100  CO2 26 27 30   GLUCOSE 120* 105* 83  BUN 25* 27* 17  CREATININE 1.52* 1.34* 1.07  CALCIUM  8.6* 8.4* 8.4*    GFR: Estimated Creatinine Clearance: 69.2 mL/min (by C-G formula based on SCr of 1.07 mg/dL). Liver Function Tests: Recent Labs  Lab 04/04/24 1750  AST 17  ALT 12  ALKPHOS 84  BILITOT 0.4  PROT 6.2*  ALBUMIN 3.2*   No results for input(s): LIPASE, AMYLASE in the last 168 hours. No results for input(s): AMMONIA in the last 168 hours. Coagulation Profile: No results for input(s): INR, PROTIME in the last 168 hours. Cardiac Enzymes: No results for input(s): CKTOTAL, CKMB, CKMBINDEX, TROPONINI in the last 168 hours. BNP (last 3 results) No results for input(s): PROBNP in the last 8760 hours. HbA1C: No results for input(s): HGBA1C in the last 72 hours. CBG: No results for input(s): GLUCAP in the last 168 hours. Lipid Profile: No results for input(s): CHOL, HDL, LDLCALC, TRIG, CHOLHDL, LDLDIRECT in the last 72 hours. Thyroid Function Tests: No results for input(s): TSH, T4TOTAL, FREET4, T3FREE, THYROIDAB in the last 72 hours. Anemia Panel: Recent Labs    04/06/24 0404  FERRITIN 11*  TIBC 357  IRON 29*  RETICCTPCT 2.1   Sepsis Labs: No results for input(s): PROCALCITON, LATICACIDVEN in the last 168 hours.  No results found for this or any previous visit (from the past 240 hours).       Radiology Studies: US  Abdomen Complete Result Date: 04/06/2024 CLINICAL DATA:  Rectal bleeding.  ETOH EXAM: ABDOMEN ULTRASOUND COMPLETE COMPARISON:  Noncontrast CT 06/20/2022. FINDINGS: Gallbladder: No gallstones or wall thickening visualized. No sonographic Murphy sign noted by sonographer. Common bile duct: Diameter: 6 mm Liver: No focal lesion identified. Within normal limits in parenchymal echogenicity. Portal vein is patent on color Doppler imaging with normal direction of blood flow towards the liver. IVC: No abnormality visualized. Pancreas: Visualized portion unremarkable. Spleen: Size and appearance within normal limits. Length  11.7 cm. Multiple splenic granulomas. Right Kidney: Length: 10.5 cm. Echogenicity within normal limits. No mass or hydronephrosis visualized. Tiny anechoic structure identified measuring 7 mm with some through transmission. Small cyst. Lower pole focus measures 14 mm and is also near anechoic with through transmission. Left Kidney: Length: 11.1 cm. Echogenicity within normal limits. No mass or hydronephrosis visualized. Abdominal aorta: Mild ectasia of the abdominal aorta of up to 3.5 cm but partially obscured by overlapping bowel gas. Other findings: Note is made of some mild ascites. There is a right-sided pleural effusion. IMPRESSION: No gallstones or ductal dilatation.  Small right-sided renal cysts. Multiple splenic granulomas as seen previously. Limited evaluation of some structures including the abdominal aorta. There is 1 image where the aorta may be slightly enlarged at 3.5 cm in the abdomen. Recommend dedicated CT to confirm diameter of the aorta when clinically appropriate. Electronically Signed   By: Ranell Bring M.D.   On: 04/06/2024 11:06   ECHOCARDIOGRAM  COMPLETE Result Date: 04/05/2024    ECHOCARDIOGRAM REPORT   Patient Name:   David Callahan Date of Exam: 04/05/2024 Medical Rec #:  [id number]       Height:       70.0 in Accession #:    [id number]      Weight:       203.9 lb Date of Birth:  December 30, 1955        BSA:          2.105 m Patient Age:    68 years        BP:           172/78 mmHg Patient Gender: M               HR:           84 bpm. Exam Location:  [facility] Procedure: 2D Echo, Cardiac Doppler and Color Doppler (Both Spectral and Color            Flow Doppler were utilized during procedure). Indications:    CHF-Acute Diastolic I50.31  History:        Patient has no prior history of Echocardiogram examinations.                 Risk Factors:Hypertension and Dyslipidemia.  Sonographer:    Aida Pizza RCS Referring Phys: [id number] CHRISTOPHER P DANFORD IMPRESSIONS  1. Left ventricular ejection  fraction, by estimation, is 60 to 65%. The left ventricle has normal function. The left ventricle has no regional wall motion abnormalities. Left ventricular diastolic parameters are indeterminate.  2. Right ventricular systolic function is normal. The right ventricular size is normal. Tricuspid regurgitation signal is inadequate for assessing PA pressure.  3. Left atrial size was severely dilated.  4. Right atrial size was moderately dilated.  5. The mitral valve is normal in structure. No evidence of mitral valve regurgitation. No evidence of mitral stenosis.  6. The aortic valve is tricuspid. There is mild calcification of the aortic valve. There is mild thickening of the aortic valve. Aortic valve regurgitation is not visualized. Mild aortic valve stenosis. Aortic valve area, by VTI measures 2.27 cm. Aortic valve mean gradient measures 11.0 mmHg.  7. The inferior vena cava is dilated in size with >50% respiratory variability, suggesting right atrial pressure of 8 mmHg. FINDINGS  Left Ventricle: Left ventricular ejection fraction, by estimation, is 60 to 65%. The left ventricle has normal function. The left ventricle has no regional wall motion abnormalities. The left ventricular internal cavity size was normal in size. There is  no left ventricular hypertrophy. Left ventricular diastolic parameters are indeterminate. Right Ventricle: The right ventricular size is normal. Right vetricular wall thickness was not well visualized. Right ventricular systolic function is normal. Tricuspid regurgitation signal is inadequate for assessing PA pressure. Left Atrium: Left atrial size was severely dilated. Right Atrium: Right atrial size was moderately dilated. Pericardium: There is no evidence of pericardial effusion. Mitral Valve: The mitral valve is normal in structure. No evidence of mitral valve regurgitation. No evidence of mitral valve stenosis. Tricuspid Valve: The tricuspid valve is normal in structure. Tricuspid  valve regurgitation is trivial. No evidence of tricuspid stenosis. Aortic Valve: The aortic valve is tricuspid. There is mild calcification of the aortic valve. There is mild thickening of the aortic valve. There is mild aortic valve annular calcification. Aortic valve regurgitation is not visualized. Mild aortic stenosis is present. Aortic valve mean gradient measures 11.0 mmHg. Aortic valve peak gradient measures 21.8  mmHg. Aortic valve area, by VTI measures 2.27 cm. Pulmonic Valve: The pulmonic valve was not well visualized. Pulmonic valve regurgitation is not visualized. No evidence of pulmonic stenosis. Aorta: The aortic root is normal in size and structure. Venous: The inferior vena cava is dilated in size with greater than 50% respiratory variability, suggesting right atrial pressure of 8 mmHg. IAS/Shunts: No atrial level shunt detected by color flow Doppler.  LEFT VENTRICLE PLAX 2D LVIDd:         5.40 cm   Diastology LVIDs:         3.10 cm   LV e' medial:    8.38 cm/s LV PW:         1.00 cm   LV E/e' medial:  14.3 LV IVS:        1.10 cm   LV e' lateral:   9.36 cm/s LVOT diam:     2.20 cm   LV E/e' lateral: 12.8 LV SV:         120 LV SV Index:   57 LVOT Area:     3.80 cm  RIGHT VENTRICLE RV S prime:     17.00 cm/s TAPSE (M-mode): 3.1 cm LEFT ATRIUM              Index        RIGHT ATRIUM           Index LA diam:        4.40 cm  2.09 cm/m   RA Area:     26.80 cm LA Vol (A2C):   110.0 ml 52.27 ml/m  RA Volume:   95.90 ml  45.57 ml/m LA Vol (A4C):   123.0 ml 58.44 ml/m LA Biplane Vol: 117.0 ml 55.59 ml/m  AORTIC VALVE AV Area (Vmax):    2.15 cm AV Area (Vmean):   2.10 cm AV Area (VTI):     2.27 cm AV Vmax:           233.25 cm/s AV Vmean:          145.750 cm/s AV VTI:            0.530 m AV Peak Grad:      21.8 mmHg AV Mean Grad:      11.0 mmHg LVOT Vmax:         132.00 cm/s LVOT Vmean:        80.400 cm/s LVOT VTI:          0.316 m LVOT/AV VTI ratio: 0.60  AORTA Ao Root diam: 3.50 cm MITRAL VALVE MV Area  (PHT): 3.17 cm     SHUNTS MV Decel Time: 239 msec     Systemic VTI:  0.32 m MV E velocity: 120.00 cm/s  Systemic Diam: 2.20 cm MV A velocity: 68.30 cm/s MV E/A ratio:  1.76 Dorn Ross MD Electronically signed by Dorn Ross MD Signature Date/Time: 04/05/2024/4:04:32 PM    Final    DG CHEST PORT 1 VIEW Result Date: 04/05/2024 CLINICAL DATA:  Dyspnea. EXAM: PORTABLE CHEST 1 VIEW COMPARISON:  03/09/2024 FINDINGS: Stable cardiomediastinal contours. Aortic atherosclerotic calcifications. Chronic coarsened interstitial markings noted bilaterally with diffuse bronchial wall thickening. Mild increased peripheral septal markings identified within the lower lung zones. No significant pleural effusion. No signs of airspace consolidation. 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 IMPRESSION: 1. Chronic coarsened interstitial markings with diffuse bronchial wall thickening. 2. Mild increased peripheral septal markings within the lower lung zones may reflect mild interstitial edema. Electronically Signed   By:  Waddell Calk M.D.   On: 04/05/2024 07:00        Scheduled Meds:  [MAR Hold] atorvastatin   40 mg Oral Daily   [MAR Hold] cyclobenzaprine   10 mg Oral QHS   [MAR Hold] diltiazem   360 mg Oral Daily   [MAR Hold] pantoprazole  (PROTONIX ) IV  40 mg Intravenous Q12H   [MAR Hold] rOPINIRole   1 mg Oral QHS   [MAR Hold] sodium chloride  flush  3 mL Intravenous Q12H   [MAR Hold] sucralfate  1 g Oral TID WC & HS   [MAR Hold] terazosin  10 mg Oral Daily   Continuous Infusions:  [MAR Hold] potassium chloride  10 mEq (04/06/24 1333)     LOS: 2 days    Time spent: 55 minutes    Chanz Cahall D Maree, DO [facility]  If 7PM-7AM, please contact night-coverage [email or web address] 04/06/2024, 4:13 PM

## 2024-04-06 NOTE — Interval H&P Note (Signed)
 History and Physical Interval Note:  04/06/2024 3:25 PM  David Callahan  has presented today for surgery, with the diagnosis of anemia, history of duodenal ulcer.  The various methods of treatment have been discussed with the patient and family. After consideration of risks, benefits and other options for treatment, the patient has consented to  Procedure(s): EGD (ESOPHAGOGASTRODUODENOSCOPY) (N/A) as a surgical intervention.  The patient's history has been reviewed, patient examined, no change in status, stable for surgery.  I have reviewed the patient's chart and labs.  Questions were answered to the patient's satisfaction.     David Callahan    Stable overnight hemoglobin 8.5 post 2 units.  Agree with need for follow-up EGD at this time.    Hemoccult positive  I will offer the patient a diagnostic EGD with therapeutic intent per plan.  The risks, benefits, limitations, alternatives and imponderables have been reviewed with the patient. Potential for esophageal dilation, biopsy, etc. have also been reviewed.  Questions have been answered. All parties agreeable.

## 2024-04-06 NOTE — Transfer of Care (Signed)
 Immediate Anesthesia Transfer of Care Note  Patient: David Callahan  Procedure(s) Performed: EGD (ESOPHAGOGASTRODUODENOSCOPY)  Patient Location: PACU  Anesthesia Type:General  Level of Consciousness: awake, alert , oriented, and patient cooperative  Airway & Oxygen  Therapy: Patient Spontanous Breathing and Patient connected to nasal cannula oxygen   Post-op Assessment: Report given to RN, Post -op Vital signs reviewed and stable, and Patient moving all extremities X 4  Post vital signs: Reviewed and stable  Last Vitals:  Vitals Value Taken Time  BP    Temp    Pulse    Resp    SpO2      Last Pain:  Vitals:   04/06/24 1537  TempSrc:   PainSc: 0-No pain      Patients Stated Pain Goal: 4 (04/06/24 1306)  Complications: No notable events documented.

## 2024-04-06 NOTE — Anesthesia Preprocedure Evaluation (Signed)
 Anesthesia Evaluation  Patient identified by MRN, date of birth, ID band Patient awake    Reviewed: Allergy & Precautions, H&P , NPO status , Patient's Chart, lab work & pertinent test results, reviewed documented beta blocker date and time   Airway Mallampati: II  TM Distance: >3 FB Neck ROM: full    Dental no notable dental hx.    Pulmonary neg pulmonary ROS, asthma , sleep apnea , pneumonia, COPD, Current Smoker and Patient abstained from smoking.   Pulmonary exam normal breath sounds clear to auscultation       Cardiovascular Exercise Tolerance: Good hypertension, negative cardio ROS + Valvular Problems/Murmurs  Rhythm:regular Rate:Normal     Neuro/Psych negative neurological ROS  negative psych ROS   GI/Hepatic negative GI ROS, Neg liver ROS,,,  Endo/Other  negative endocrine ROS    Renal/GU Renal diseasenegative Renal ROS  negative genitourinary   Musculoskeletal   Abdominal   Peds  Hematology negative hematology ROS (+) Blood dyscrasia, anemia   Anesthesia Other Findings   Reproductive/Obstetrics negative OB ROS                             Anesthesia Physical Anesthesia Plan  ASA: 4 and emergent  Anesthesia Plan: General   Post-op Pain Management:    Induction:   PONV Risk Score and Plan: Propofol infusion  Airway Management Planned:   Additional Equipment:   Intra-op Plan:   Post-operative Plan:   Informed Consent: I have reviewed the patients History and Physical, chart, labs and discussed the procedure including the risks, benefits and alternatives for the proposed anesthesia with the patient or authorized representative who has indicated his/her understanding and acceptance.     Dental Advisory Given  Plan Discussed with: CRNA  Anesthesia Plan Comments:        Anesthesia Quick Evaluation

## 2024-04-06 NOTE — Progress Notes (Signed)
 Briefly, this is a 68 year old male who presented to the ED yesterday with worsening fatigue and anemia, hgb found to be 6.1. recent hospitaliztion with EGD revealing duodenal ulcer secondary to NSAID use which he reports he has stopped. Given the size and characteristics of previous ulcer, our team recommended EGD today for further evaluation, pending ECHO which showed EF 60-65%.   Hgb is stable at 8.5, s/p 2 units PRBCs, hemodynamically stable at this time.  NPO since midnight.   Indications, risks and benefits of procedure discussed in detail with patient. Patient verbalized understanding and is in agreement to proceed with EGD later today.   Clydine Parkison L. Larren Copes, MSN, APRN, AGNP-C Adult-Gerontology Nurse Practitioner [facility] at [facility]

## 2024-04-07 ENCOUNTER — Telehealth: Payer: Self-pay | Admitting: Gastroenterology

## 2024-04-07 ENCOUNTER — Other Ambulatory Visit: Payer: Self-pay | Admitting: *Deleted

## 2024-04-07 ENCOUNTER — Encounter (HOSPITAL_COMMUNITY): Payer: Self-pay | Admitting: Internal Medicine

## 2024-04-07 DIAGNOSIS — D509 Iron deficiency anemia, unspecified: Secondary | ICD-10-CM

## 2024-04-07 DIAGNOSIS — D62 Acute posthemorrhagic anemia: Secondary | ICD-10-CM

## 2024-04-07 DIAGNOSIS — D649 Anemia, unspecified: Secondary | ICD-10-CM | POA: Diagnosis not present

## 2024-04-07 LAB — CBC
HCT: 28.8 % — ABNORMAL LOW (ref 39.0–52.0)
Hemoglobin: 9 g/dL — ABNORMAL LOW (ref 13.0–17.0)
MCH: 26.9 pg (ref 26.0–34.0)
MCHC: 31.3 g/dL (ref 30.0–36.0)
MCV: 86 fL (ref 80.0–100.0)
Platelets: 518 10*3/uL — ABNORMAL HIGH (ref 150–400)
RBC: 3.35 MIL/uL — ABNORMAL LOW (ref 4.22–5.81)
RDW: 17.8 % — ABNORMAL HIGH (ref 11.5–15.5)
WBC: 8.5 10*3/uL (ref 4.0–10.5)
nRBC: 0 % (ref 0.0–0.2)

## 2024-04-07 LAB — BASIC METABOLIC PANEL WITH GFR
Anion gap: 10 (ref 5–15)
BUN: 15 mg/dL (ref 8–23)
CO2: 27 mmol/L (ref 22–32)
Calcium: 8.6 mg/dL — ABNORMAL LOW (ref 8.9–10.3)
Chloride: 101 mmol/L (ref 98–111)
Creatinine, Ser: 1.09 mg/dL (ref 0.61–1.24)
GFR, Estimated: >60 mL/min (ref >60)
Glucose, Bld: 77 mg/dL (ref 70–99)
Potassium: 3.4 mmol/L — ABNORMAL LOW (ref 3.5–5.1)
Sodium: 138 mmol/L (ref 135–145)

## 2024-04-07 LAB — MAGNESIUM: Magnesium: 2.1 mg/dL (ref 1.7–2.4)

## 2024-04-07 MED ORDER — DOCUSATE SODIUM 100 MG PO CAPS: 100.0000 mg | ORAL_CAPSULE | Freq: Two times a day (BID) | ORAL | 2 refills | Status: DC

## 2024-04-07 MED ORDER — SUCRALFATE 1 GM/10ML PO SUSP: 1.0000 g | Freq: Three times a day (TID) | ORAL | 0 refills | Status: DC

## 2024-04-07 MED ORDER — FERROUS SULFATE 325 (65 FE) MG PO TBEC: 325.0000 mg | DELAYED_RELEASE_TABLET | Freq: Two times a day (BID) | ORAL | 3 refills | Status: DC

## 2024-04-07 MED ORDER — POTASSIUM CHLORIDE CRYS ER 20 MEQ PO TBCR
40.0000 meq | EXTENDED_RELEASE_TABLET | Freq: Once | ORAL | Status: AC
  Administered 2024-04-07: 40 meq via ORAL
  Filled 2024-04-07: qty 2

## 2024-04-07 NOTE — Care Management Important Message (Signed)
 Important Message  Patient Details  Name: David Callahan MRN: [id number] Date of Birth: 01-16-1956   Important Message Given:  Yes - Medicare IM     Ladarion Munyon L Lamanda Rudder 04/07/2024, 10:18 AM

## 2024-04-07 NOTE — Progress Notes (Signed)
 IV removed and DC instructions reviewed.  New scripts sent to pharmacy.  Transported by [facility] to ER entrance and to drive self home

## 2024-04-07 NOTE — Telephone Encounter (Signed)
Spoke to pt, informed him of recommendations. Pt voiced understanding. Labs entered into [employer]

## 2024-04-07 NOTE — Plan of Care (Signed)

## 2024-04-07 NOTE — Telephone Encounter (Signed)
 Devere - please arrange hospital follow-up with Josette Cree, myself, or Dr. Eartha in about 3-4 weeks.   Charmaine RAMAN - please arrange CBC outpatient for patient in 1 week.

## 2024-04-07 NOTE — Discharge Summary (Signed)
 Physician Discharge Summary  David Callahan FMW:[id number] DOB: Mar 24, 1956 DOA: 04/04/2024  PCP: [name], NP  Admit date: 04/04/2024  Discharge date: 04/07/2024  Admitted From:Home  Disposition:  Home  Recommendations for Outpatient Follow-up:  Follow up with PCP in 1-2 weeks Follow-up with gastroenterology outpatient for further follow-up Remain on PPI twice daily and avoid NSAIDs Carafate as prescribed for 4 weeks Ferrous sulfate and Colace Continue other home medications as prior  Home Health: None  Equipment/Devices: None  Discharge Condition:Stable  CODE STATUS: Full  Diet recommendation: Heart Healthy  Brief/Interim Summary: 68 y.o. M with HTN, COPD, and recently admitted for melena, found to have PUD and duodenal ulcer, who returns for persistent anemia.   Went to his PCP for routine follow up, found to have anemia again, sent to the ER.  Denies any recent melena, hematemesis.  He has undergone EGD on 6/25 with no significant findings of bleed and possibly recently healed duodenal ulcer.  He has required 2 unit PRBC transfusion on admission with improvement in hemoglobin levels noted.  He was also noted to have some mild AKI which has resolved and returned back to baseline after hydration and transfusion.  He has stable hemoglobin levels and has been tolerating diet this morning.  He has been instructed to remain off of all NSAIDs and remain on PPI twice daily as well as Carafate and follow-up with GI outpatient.  He was given some IV iron due to low iron levels during this hospitalization and will be prescribed ferrous sulfate outpatient.  No other acute events or concerns noted.  Discharge Diagnoses:  Principal Problem:   Symptomatic anemia Active Problems:   COPD (chronic obstructive pulmonary disease) (HCC)   AKI (acute kidney injury) (HCC)   Essential hypertension  Principal discharge diagnosis: Symptomatic anemia along with AKI with noted iron deficiency  requiring 2 unit PRBC transfusion.  Discharge Instructions  Discharge Instructions     Ambulatory referral to Gastroenterology   Complete by: As directed    What is the reason for referral?: Other   Diet - low sodium heart healthy   Complete by: As directed    Increase activity slowly   Complete by: As directed       Allergies as of 04/07/2024       Reactions   Motrin [ibuprofen] Anaphylaxis, Swelling   Anti-inflammatory analgesics- cause anaphylaxis        Medication List     STOP taking these medications    furosemide 20 MG tablet Commonly known as: LASIX   predniSONE  50 MG tablet Commonly known as: DELTASONE        TAKE these medications    acetaminophen  500 MG tablet Commonly known as: TYLENOL  Take 1,000 mg by mouth every 6 (six) hours as needed for mild pain (pain score 1-3).   albuterol  108 (90 Base) MCG/ACT inhaler Commonly known as: VENTOLIN  HFA Inhale 2 puffs into the lungs every 6 (six) hours as needed for shortness of breath.   atorvastatin  40 MG tablet Commonly known as: LIPITOR Take 40 mg by mouth daily.   cyclobenzaprine  10 MG tablet Commonly known as: FLEXERIL  Take 10 mg by mouth at bedtime.   diltiazem  360 MG 24 hr capsule Commonly known as: CARDIZEM  CD Take 360 mg by mouth daily.   docusate sodium 100 MG capsule Commonly known as: Colace Take 1 capsule (100 mg total) by mouth 2 (two) times daily.   feeding supplement (ENSURE COMPLETE) Liqd Take 237 mLs by mouth 2 (  two) times daily between meals.   ferrous sulfate 325 (65 FE) MG EC tablet Take 1 tablet (325 mg total) by mouth 2 (two) times daily.   HYDROcodone -acetaminophen  10-325 MG tablet Commonly known as: NORCO Take 1 tablet by mouth every 4 (four) hours as needed for moderate pain (pain score 4-6).   Incruse Ellipta  62.5 MCG/ACT Aepb Generic drug: umeclidinium bromide  Inhale 1 puff into the lungs daily.   pantoprazole  40 MG tablet Commonly known as: PROTONIX  Take 1  tablet (40 mg total) by mouth 2 (two) times daily.   rOPINIRole  1 MG tablet Commonly known as: REQUIP  Take 1 mg by mouth daily.   sucralfate 1 GM/10ML suspension Commonly known as: CARAFATE Take 10 mLs (1 g total) by mouth 4 (four) times daily -  with meals and at bedtime for 28 days.   terazosin 10 MG capsule Commonly known as: HYTRIN Take 10 mg by mouth daily.   traZODone  150 MG tablet Commonly known as: DESYREL  Take 150 mg by mouth at bedtime as needed for sleep.        Follow-up Information     [name], NP. Schedule an appointment as soon as possible for a visit in 1 week(s).   Specialty: Nurse Practitioner Contact information: [address] [place] [address] [phone]         Eartha Angelia Sieving, MD. Go to.   Specialty: Gastroenterology Contact information: [address] [place] [address] [phone]                Allergies  Allergen Reactions   Motrin [Ibuprofen] Anaphylaxis and Swelling    Anti-inflammatory analgesics- cause anaphylaxis    Consultations: GI   Procedures/Studies: US  Abdomen Complete Result Date: 04/06/2024 CLINICAL DATA:  Rectal bleeding.  ETOH EXAM: ABDOMEN ULTRASOUND COMPLETE COMPARISON:  Noncontrast CT 06/20/2022. FINDINGS: Gallbladder: No gallstones or wall thickening visualized. No sonographic Murphy sign noted by sonographer. Common bile duct: Diameter: 6 mm Liver: No focal lesion identified. Within normal limits in parenchymal echogenicity. Portal vein is patent on color Doppler imaging with normal direction of blood flow towards the liver. IVC: No abnormality visualized. Pancreas: Visualized portion unremarkable. Spleen: Size and appearance within normal limits. Length 11.7 cm. Multiple splenic granulomas. Right Kidney: Length: 10.5 cm. Echogenicity within normal limits. No mass or hydronephrosis visualized. Tiny anechoic structure identified measuring 7 mm with some through  transmission. Small cyst. Lower pole focus measures 14 mm and is also near anechoic with through transmission. Left Kidney: Length: 11.1 cm. Echogenicity within normal limits. No mass or hydronephrosis visualized. Abdominal aorta: Mild ectasia of the abdominal aorta of up to 3.5 cm but partially obscured by overlapping bowel gas. Other findings: Note is made of some mild ascites. There is a right-sided pleural effusion. IMPRESSION: No gallstones or ductal dilatation.  Small right-sided renal cysts. Multiple splenic granulomas as seen previously. Limited evaluation of some structures including the abdominal aorta. There is 1 image where the aorta may be slightly enlarged at 3.5 cm in the abdomen. Recommend dedicated CT to confirm diameter of the aorta when clinically appropriate. Electronically Signed   By: Ranell Bring M.D.   On: 04/06/2024 11:06   ECHOCARDIOGRAM COMPLETE Result Date: 04/05/2024    ECHOCARDIOGRAM REPORT   Patient Name:   NEWTON FRUTIGER Date of Exam: 04/05/2024 Medical Rec #:  [id number]       Height:       70.0 in Accession #:    [id number]  Weight:       203.9 lb Date of Birth:  08-06-1956        BSA:          2.105 m Patient Age:    68 years        BP:           172/78 mmHg Patient Gender: M               HR:           84 bpm. Exam Location:  [facility] Procedure: 2D Echo, Cardiac Doppler and Color Doppler (Both Spectral and Color            Flow Doppler were utilized during procedure). Indications:    CHF-Acute Diastolic I50.31  History:        Patient has no prior history of Echocardiogram examinations.                 Risk Factors:Hypertension and Dyslipidemia.  Sonographer:    Aida Pizza RCS Referring Phys: [id number] CHRISTOPHER P DANFORD IMPRESSIONS  1. Left ventricular ejection fraction, by estimation, is 60 to 65%. The left ventricle has normal function. The left ventricle has no regional wall motion abnormalities. Left ventricular diastolic parameters are indeterminate.  2. Right  ventricular systolic function is normal. The right ventricular size is normal. Tricuspid regurgitation signal is inadequate for assessing PA pressure.  3. Left atrial size was severely dilated.  4. Right atrial size was moderately dilated.  5. The mitral valve is normal in structure. No evidence of mitral valve regurgitation. No evidence of mitral stenosis.  6. The aortic valve is tricuspid. There is mild calcification of the aortic valve. There is mild thickening of the aortic valve. Aortic valve regurgitation is not visualized. Mild aortic valve stenosis. Aortic valve area, by VTI measures 2.27 cm. Aortic valve mean gradient measures 11.0 mmHg.  7. The inferior vena cava is dilated in size with >50% respiratory variability, suggesting right atrial pressure of 8 mmHg. FINDINGS  Left Ventricle: Left ventricular ejection fraction, by estimation, is 60 to 65%. The left ventricle has normal function. The left ventricle has no regional wall motion abnormalities. The left ventricular internal cavity size was normal in size. There is  no left ventricular hypertrophy. Left ventricular diastolic parameters are indeterminate. Right Ventricle: The right ventricular size is normal. Right vetricular wall thickness was not well visualized. Right ventricular systolic function is normal. Tricuspid regurgitation signal is inadequate for assessing PA pressure. Left Atrium: Left atrial size was severely dilated. Right Atrium: Right atrial size was moderately dilated. Pericardium: There is no evidence of pericardial effusion. Mitral Valve: The mitral valve is normal in structure. No evidence of mitral valve regurgitation. No evidence of mitral valve stenosis. Tricuspid Valve: The tricuspid valve is normal in structure. Tricuspid valve regurgitation is trivial. No evidence of tricuspid stenosis. Aortic Valve: The aortic valve is tricuspid. There is mild calcification of the aortic valve. There is mild thickening of the aortic valve.  There is mild aortic valve annular calcification. Aortic valve regurgitation is not visualized. Mild aortic stenosis is present. Aortic valve mean gradient measures 11.0 mmHg. Aortic valve peak gradient measures 21.8 mmHg. Aortic valve area, by VTI measures 2.27 cm. Pulmonic Valve: The pulmonic valve was not well visualized. Pulmonic valve regurgitation is not visualized. No evidence of pulmonic stenosis. Aorta: The aortic root is normal in size and structure. Venous: The inferior vena cava is dilated in size with greater than 50% respiratory  variability, suggesting right atrial pressure of 8 mmHg. IAS/Shunts: No atrial level shunt detected by color flow Doppler.  LEFT VENTRICLE PLAX 2D LVIDd:         5.40 cm   Diastology LVIDs:         3.10 cm   LV e' medial:    8.38 cm/s LV PW:         1.00 cm   LV E/e' medial:  14.3 LV IVS:        1.10 cm   LV e' lateral:   9.36 cm/s LVOT diam:     2.20 cm   LV E/e' lateral: 12.8 LV SV:         120 LV SV Index:   57 LVOT Area:     3.80 cm  RIGHT VENTRICLE RV S prime:     17.00 cm/s TAPSE (M-mode): 3.1 cm LEFT ATRIUM              Index        RIGHT ATRIUM           Index LA diam:        4.40 cm  2.09 cm/m   RA Area:     26.80 cm LA Vol (A2C):   110.0 ml 52.27 ml/m  RA Volume:   95.90 ml  45.57 ml/m LA Vol (A4C):   123.0 ml 58.44 ml/m LA Biplane Vol: 117.0 ml 55.59 ml/m  AORTIC VALVE AV Area (Vmax):    2.15 cm AV Area (Vmean):   2.10 cm AV Area (VTI):     2.27 cm AV Vmax:           233.25 cm/s AV Vmean:          145.750 cm/s AV VTI:            0.530 m AV Peak Grad:      21.8 mmHg AV Mean Grad:      11.0 mmHg LVOT Vmax:         132.00 cm/s LVOT Vmean:        80.400 cm/s LVOT VTI:          0.316 m LVOT/AV VTI ratio: 0.60  AORTA Ao Root diam: 3.50 cm MITRAL VALVE MV Area (PHT): 3.17 cm     SHUNTS MV Decel Time: 239 msec     Systemic VTI:  0.32 m MV E velocity: 120.00 cm/s  Systemic Diam: 2.20 cm MV A velocity: 68.30 cm/s MV E/A ratio:  1.76 Dorn Ross MD  Electronically signed by Dorn Ross MD Signature Date/Time: 04/05/2024/4:04:32 PM    Final    DG CHEST PORT 1 VIEW Result Date: 04/05/2024 CLINICAL DATA:  Dyspnea. EXAM: PORTABLE CHEST 1 VIEW COMPARISON:  03/09/2024 FINDINGS: Stable cardiomediastinal contours. Aortic atherosclerotic calcifications. Chronic coarsened interstitial markings noted bilaterally with diffuse bronchial wall thickening. Mild increased peripheral septal markings identified within the lower lung zones. No significant pleural effusion. No signs of airspace consolidation. 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 IMPRESSION: 1. Chronic coarsened interstitial markings with diffuse bronchial wall thickening. 2. Mild increased peripheral septal markings within the lower lung zones may reflect mild interstitial edema. Electronically Signed   By: Waddell Calk M.D.   On: 04/05/2024 07:00   DG Chest Port 1 View Result Date: 03/09/2024 CLINICAL DATA:  68 year old male with weakness. EXAM: PORTABLE CHEST 1 VIEW COMPARISON:  Chest radiographs 07/01/2022 and earlier. FINDINGS: Portable AP upright view at 0735 hours. Lungs appear somewhat hyperinflated, with improved left  lung size and ventilation since 2023. Mediastinal contours are within normal limits. Visualized tracheal air column is within normal limits. Allowing for portable technique the lungs are clear. No pneumothorax or pleural effusion. Negative visible bowel gas. No acute osseous abnormality identified. Chronic severe right glenohumeral degeneration. IMPRESSION: No acute cardiopulmonary abnormality. Electronically Signed   By: VEAR Hurst M.D.   On: 03/09/2024 07:54     Discharge Exam: Vitals:   04/06/24 1946 04/07/24 0342  BP: (!) 156/77 (!) 151/75  Pulse: 68 68  Resp: 20 16  Temp: 97.7 F (36.5 C) (!) 97.4 F (36.3 C)  SpO2: 98% 97%   Vitals:   04/06/24 1615 04/06/24 1640 04/06/24 1946 04/07/24 0342  BP: 113/71 (!) 144/76 (!) 156/77 (!) 151/75   Pulse: 72 65 68 68  Resp: 17 18 20 16   Temp:  97.7 F (36.5 C) 97.7 F (36.5 C) (!) 97.4 F (36.3 C)  TempSrc:  Oral Oral Oral  SpO2: 98% 98% 98% 97%  Weight:      Height:        General: Pt is alert, awake, not in acute distress Cardiovascular: RRR, S1/S2 +, no rubs, no gallops Respiratory: CTA bilaterally, no wheezing, no rhonchi Abdominal: Soft, NT, ND, bowel sounds + Extremities: no edema, no cyanosis    The results of significant diagnostics from this hospitalization (including imaging, microbiology, ancillary and laboratory) are listed below for reference.     Microbiology: No results found for this or any previous visit (from the past 240 hours).   Labs: BNP (last 3 results) No results for input(s): BNP in the last 8760 hours. Basic Metabolic Panel: Recent Labs  Lab 04/04/24 1750 04/05/24 0143 04/06/24 0404 04/07/24 0358  NA 139 139 139 138  K 3.9 3.8 3.2* 3.4*  CL 100 101 100 101  CO2 26 27 30 27   GLUCOSE 120* 105* 83 77  BUN 25* 27* 17 15  CREATININE 1.52* 1.34* 1.07 1.09  CALCIUM  8.6* 8.4* 8.4* 8.6*  MG  --   --   --  2.1   Liver Function Tests: Recent Labs  Lab 04/04/24 1750  AST 17  ALT 12  ALKPHOS 84  BILITOT 0.4  PROT 6.2*  ALBUMIN 3.2*   No results for input(s): LIPASE, AMYLASE in the last 168 hours. No results for input(s): AMMONIA in the last 168 hours. CBC: Recent Labs  Lab 04/04/24 1750 04/05/24 0143 04/05/24 0845 04/05/24 1534 04/06/24 0404 04/07/24 0358  WBC 8.0 9.4 9.3 8.3 9.0 8.5  NEUTROABS 6.5  --   --   --   --   --   HGB 6.1* 7.3* 7.6* 7.8* 8.5* 9.0*  HCT 19.5* 23.2* 23.9* 25.4* 27.4* 28.8*  MCV 85.9 86.2 86.3 86.1 85.4 86.0  PLT 489* 434* 408* 431* 469* 518*   Cardiac Enzymes: No results for input(s): CKTOTAL, CKMB, CKMBINDEX, TROPONINI in the last 168 hours. BNP: Invalid input(s): POCBNP CBG: No results for input(s): GLUCAP in the last 168 hours. D-Dimer No results for input(s):  DDIMER in the last 72 hours. Hgb A1c No results for input(s): HGBA1C in the last 72 hours. Lipid Profile No results for input(s): CHOL, HDL, LDLCALC, TRIG, CHOLHDL, LDLDIRECT in the last 72 hours. Thyroid function studies No results for input(s): TSH, T4TOTAL, T3FREE, THYROIDAB in the last 72 hours.  Invalid input(s): FREET3 Anemia work up Recent Labs    04/06/24 0404  FERRITIN 11*  TIBC 357  IRON 29*  RETICCTPCT 2.1   Urinalysis  Component Value Date/Time   COLORURINE YELLOW 03/09/2024 1520   APPEARANCEUR CLEAR 03/09/2024 1520   LABSPEC 1.021 03/09/2024 1520   PHURINE 5.0 03/09/2024 1520   GLUCOSEU NEGATIVE 03/09/2024 1520   HGBUR NEGATIVE 03/09/2024 1520   BILIRUBINUR NEGATIVE 03/09/2024 1520   KETONESUR NEGATIVE 03/09/2024 1520   PROTEINUR 30 (A) 03/09/2024 1520   NITRITE NEGATIVE 03/09/2024 1520   LEUKOCYTESUR NEGATIVE 03/09/2024 1520   Sepsis Labs Recent Labs  Lab 04/05/24 0845 04/05/24 1534 04/06/24 0404 04/07/24 0358  WBC 9.3 8.3 9.0 8.5   Microbiology No results found for this or any previous visit (from the past 240 hours).   Time coordinating discharge: 35 minutes  SIGNED:   Adron JONETTA Fairly, DO [facility] 04/07/2024, 10:08 AM  If 7PM-7AM, please contact night-coverage [email or web address]

## 2024-04-07 NOTE — Plan of Care (Signed)

## 2024-04-08 NOTE — Anesthesia Postprocedure Evaluation (Signed)
 Anesthesia Post Note  Patient: David Callahan  Procedure(s) Performed: EGD (ESOPHAGOGASTRODUODENOSCOPY)  Patient location during evaluation: Phase II Anesthesia Type: General Level of consciousness: awake Pain management: pain level controlled Vital Signs Assessment: post-procedure vital signs reviewed and stable Respiratory status: spontaneous breathing and respiratory function stable Cardiovascular status: blood pressure returned to baseline and stable Postop Assessment: no headache and no apparent nausea or vomiting Anesthetic complications: no Comments: Late entry   No notable events documented.   Last Vitals:  Vitals:   04/06/24 1946 04/07/24 0342  BP: (!) 156/77 (!) 151/75  Pulse: 68 68  Resp: 20 16  Temp: 36.5 C (!) 36.3 C  SpO2: 98% 97%    Last Pain:  Vitals:   04/07/24 0817  TempSrc:   PainSc: 9                  Yvonna JINNY [name]

## 2024-04-14 ENCOUNTER — Ambulatory Visit (INDEPENDENT_AMBULATORY_CARE_PROVIDER_SITE_OTHER): Admitting: Gastroenterology

## 2024-04-14 ENCOUNTER — Encounter (INDEPENDENT_AMBULATORY_CARE_PROVIDER_SITE_OTHER): Payer: Self-pay | Admitting: Gastroenterology

## 2024-04-14 VITALS — BP 119/57 | HR 87 | Temp 98.5°F | Ht 70.0 in | Wt 182.2 lb

## 2024-04-14 DIAGNOSIS — D62 Acute posthemorrhagic anemia: Secondary | ICD-10-CM

## 2024-04-14 DIAGNOSIS — D649 Anemia, unspecified: Secondary | ICD-10-CM | POA: Diagnosis not present

## 2024-04-14 DIAGNOSIS — K269 Duodenal ulcer, unspecified as acute or chronic, without hemorrhage or perforation: Secondary | ICD-10-CM

## 2024-04-14 DIAGNOSIS — Z8711 Personal history of peptic ulcer disease: Secondary | ICD-10-CM

## 2024-04-14 NOTE — Progress Notes (Addendum)
 Referring Provider: [name], NP Primary Care Physician:  [name], NP Primary GI Physician: Dr. Eartha   Chief Complaint  Patient presents with   Follow-up    Pt arrives for follow up. Pt states he was at [facility] last week for 3 days to due bleeding. Pt had blood transfusion 04/07/24   HPI:   David Callahan is a 68 y.o. male with past medical history of asthma, COPD, HTN, HLD   Patient presenting today for:  Hospital follow up of anemia, heme positive stool, recent duodenal ulcer   Recent admission in may for coffee ground emesis, melena, EGD at that time showed Grade a esophagitis, gastritis, nonobstructing large duodenal bulb ulcer with a nonbleeding visible vessel. Suspected NSAID induced etiology. Area was injected with epinephrine  and treated with bipolar cautery. Recommended if any significant bleeding, obtain IR consult stat for possible GDA embolization   Patient reports abstaining from NSAIDs, however, presented to [facility] again on 6/24 with ongoing anemia. He underwent repeat EGD on 6/25 with normal esophagus, normal stomach, D2 diverticulum, possible duodenal scar, no source of bleeding. Query downstream NSAID insult though advised to hold off on capsule study at this time, trend H&H, consider capsule of bleeding or melena.  Last hgb on 6/26 was 9, up from 6.1 on 6/23, s/p 2 units PRCS  Present: feeling pretty good, taking iron  daily with greener stools. No more darker stools. No BRBPR. Denies abdominal pain, nausea, vomiting. No constipation or diarrhea. States he feels his strength is improving. No sob or dizziness. Had some labs done with PCP a few days ago but he has not gotten the results of these yet. He is doing carafate  QID, PPI BID. He has not taken any NSAIDs. Only using tylenol  as needed.   Last colonoscopy: March 2025, [place], normal per patient, repeat in 10 years  Last Endoscopy: as above   [facility] Weights   04/14/24 0957  Weight: 182 lb  3.2 oz (82.6 kg)     Past Medical History:  Diagnosis Date   Asthma    COPD (chronic obstructive pulmonary disease) (HCC)    Heart murmur    Hypertension    Sleep apnea     Past Surgical History:  Procedure Laterality Date   COLONOSCOPY     ESOPHAGOGASTRODUODENOSCOPY N/A 03/10/2024   Procedure: EGD (ESOPHAGOGASTRODUODENOSCOPY);  Surgeon: Cinderella Deatrice FALCON, MD;  [facility];  Service: Endoscopy;  Laterality: N/A;   ESOPHAGOGASTRODUODENOSCOPY N/A 04/06/2024   Procedure: EGD (ESOPHAGOGASTRODUODENOSCOPY);  Surgeon: Shaaron Lamar HERO, MD;  Location: [facility];  Service: Endoscopy;  Laterality: N/A;   JOINT REPLACEMENT Left    knee   KNEE SURGERY      Current Outpatient Medications  Medication Sig Dispense Refill   acetaminophen  (TYLENOL ) 500 MG tablet Take 1,000 mg by mouth every 6 (six) hours as needed for mild pain (pain score 1-3).     albuterol  (VENTOLIN  HFA) 108 (90 Base) MCG/ACT inhaler Inhale 2 puffs into the lungs every 6 (six) hours as needed for shortness of breath.     atorvastatin  (LIPITOR) 40 MG tablet Take 40 mg by mouth daily.     cyclobenzaprine  (FLEXERIL ) 10 MG tablet Take 10 mg by mouth at bedtime.     diltiazem  (CARDIZEM  CD) 360 MG 24 hr capsule Take 360 mg by mouth daily.     docusate sodium  (COLACE) 100 MG capsule Take 1 capsule (100 mg total) by mouth 2 (two) times daily. 60 capsule  2   feeding supplement, ENSURE COMPLETE, (ENSURE COMPLETE) LIQD Take 237 mLs by mouth 2 (two) times daily between meals. 60 mL 1   ferrous sulfate  325 (65 FE) MG EC tablet Take 1 tablet (325 mg total) by mouth 2 (two) times daily. 60 tablet 3   HYDROcodone -acetaminophen  (NORCO) 10-325 MG tablet Take 1 tablet by mouth every 4 (four) hours as needed for moderate pain (pain score 4-6).     pantoprazole  (PROTONIX ) 40 MG tablet Take 1 tablet (40 mg total) by mouth 2 (two) times daily. 60 tablet 1   rOPINIRole  (REQUIP ) 1 MG tablet Take 1 mg by mouth daily.     sucralfate   (CARAFATE ) 1 GM/10ML suspension Take 10 mLs (1 g total) by mouth 4 (four) times daily -  with meals and at bedtime for 28 days. 1120 mL 0   terazosin  (HYTRIN ) 10 MG capsule Take 10 mg by mouth daily.     traZODone  (DESYREL ) 150 MG tablet Take 150 mg by mouth at bedtime as needed for sleep.     umeclidinium bromide  (INCRUSE ELLIPTA ) 62.5 MCG/ACT AEPB Inhale 1 puff into the lungs daily. 30 each 0   No current facility-administered medications for this visit.    Allergies as of 04/14/2024 - Review Complete 04/14/2024  Allergen Reaction Noted   Motrin [ibuprofen] Anaphylaxis and Swelling 10/07/2020    Social History   Socioeconomic History   Marital status: Divorced    Spouse name: Not on file   Number of children: Not on file   Years of education: Not on file   Highest education level: Not on file  Occupational History   Not on file  Tobacco Use   Smoking status: Every Day    Current packs/day: 0.50    Types: Cigarettes, Cigars   Smokeless tobacco: Never  Vaping Use   Vaping status: Never Used  Substance and Sexual Activity   Alcohol use: Yes    Comment: ocassional homemade moonshine per pt   Drug use: Never   Sexual activity: Not on file  Other Topics Concern   Not on file  Social History Narrative   Not on file   Social Drivers of Health   Financial Resource Strain: Not on file  Food Insecurity: No Food Insecurity (04/04/2024)   Hunger Vital Sign    Worried About Running Out of Food in the Last Year: Never true    Ran Out of Food in the Last Year: Never true  Transportation Needs: No Transportation Needs (04/04/2024)   PRAPARE - Administrator, Civil Service (Medical): No    Lack of Transportation (Non-Medical): No  Physical Activity: Not on file  Stress: Not on file  Social Connections: Socially Isolated (04/04/2024)   Social Connection and Isolation Panel    Frequency of Communication with Friends and Family: Twice a week    Frequency of Social  Gatherings with Friends and Family: Twice a week    Attends Religious Services: Never    Diplomatic Services operational officer: No    Attends Engineer, structural: Never    Marital Status: Divorced    Review of systems General: negative for malaise, night sweats, fever, chills, weight loss Neck: Negative for lumps, goiter, pain and significant neck swelling Resp: Negative for cough, wheezing, dyspnea at rest CV: Negative for chest pain, leg swelling, palpitations, orthopnea GI: denies melena, hematochezia, nausea, vomiting, diarrhea, constipation, dysphagia, odyonophagia, early satiety or unintentional weight loss.  The remainder of the review  of systems is noncontributory.  Physical Exam: Ht 5' 10 (1.778 m)   Wt 182 lb 3.2 oz (82.6 kg)   BMI 26.14 kg/m  General:   Alert and oriented. No distress noted. Pleasant and cooperative.  Head:  Normocephalic and atraumatic. Eyes:  Conjuctiva clear without scleral icterus. Mouth:  Oral mucosa pink and moist. Good dentition. No lesions. Heart: Normal rate and rhythm, s1 and s2 heart sounds present.  Lungs: Clear lung sounds in all lobes. Respirations equal and unlabored. Abdomen:  +BS, soft, non-tender and non-distended. No rebound or guarding. No HSM or masses noted. Derm: No palmar erythema or jaundice Msk:  Symmetrical without gross deformities. Normal posture. Extremities:  Without edema. Neurologic:  Alert and  oriented x4 Psych:  Alert and cooperative. Normal mood and affect.  Invalid input(s): 6 MONTHS   ASSESSMENT: David Callahan is a 68 y.o. male presenting today for hospital follow up of anemia, history of duodenal ulcer  Doing well since recent admission, taking iron  daily, PPI BID and Carafate  1g QID. He denies any black stools, BRBPR. He has no GI complaints. Is feeling much less fatigued than previously. Notes he had labs done with PCP a few days ago. I will obtain these for review and determine timing of  repeat h&h/Iron  studies thereafter. For now will continue with Iron  po daily, PPI BID, carafate  QID. We discussed potential capsule endoscopy if he were to have recurrent melena or ongoing IDA, though given stability and clinical improvement, no indication for capsule study at this time. He will make me aware of any new or worsening GI issues.    PLAN:  -Continue PPI BID -continue iron  daily -avoid all NSAIDs -review recent labs from PCP, will determine timing of repeat h&h and IRon  studies thereafter -consider capsule study if melena/coffee ground stools recur/hemoglobin trends down again  All questions were answered, patient verbalized understanding and is in agreement with plan as outlined above.    Follow Up: 3 months   Kensly Bowmer L. Mariette, MSN, APRN, AGNP-C Adult-Gerontology Nurse Practitioner [facility]  I have reviewed the note and agree with the APP's assessment as described in this progress note  Toribio Fortune, MD Gastroenterology and Hepatology [facility]

## 2024-04-14 NOTE — Patient Instructions (Signed)
-  Continue protonix  40mg  twice daily -continue iron  daily -avoid all NSAIDs (ibuprofen, aleve , naproxyen, advil, goody/bc powder) -I will get most recent labs from your PCP to review and let you know when we need to repeat labs thereafter  -consider capsule study if melena/coffee ground stools occurs again or your blood counts drop  Follow up 3 month  It was a pleasure to see you today. I want to create trusting relationships with patients and provide genuine, compassionate, and quality care. I truly value your feedback! please be on the lookout for a survey regarding your visit with me today. I appreciate your input about our visit and your time in completing this!    Gregary Blackard L. Vernel Donlan, MSN, APRN, AGNP-C Adult-Gerontology Nurse Practitioner [facility] at [facility]

## 2024-05-08 ENCOUNTER — Emergency Department (HOSPITAL_COMMUNITY)
Admission: EM | Admit: 2024-05-08 | Discharge: 2024-05-08 | DRG: 392 | Disposition: A | Discharge status: Home / Self Care | Attending: Internal Medicine | Admitting: Internal Medicine

## 2024-05-08 ENCOUNTER — Other Ambulatory Visit: Payer: Self-pay

## 2024-05-08 ENCOUNTER — Encounter (HOSPITAL_COMMUNITY): Payer: Self-pay

## 2024-05-08 DIAGNOSIS — G4700 Insomnia, unspecified: Secondary | ICD-10-CM | POA: Diagnosis not present

## 2024-05-08 DIAGNOSIS — E785 Hyperlipidemia, unspecified: Secondary | ICD-10-CM | POA: Diagnosis present

## 2024-05-08 DIAGNOSIS — J4489 Other specified chronic obstructive pulmonary disease: Secondary | ICD-10-CM | POA: Diagnosis present

## 2024-05-08 DIAGNOSIS — F17210 Nicotine dependence, cigarettes, uncomplicated: Secondary | ICD-10-CM | POA: Diagnosis not present

## 2024-05-08 DIAGNOSIS — G4730 Sleep apnea, unspecified: Secondary | ICD-10-CM | POA: Diagnosis present

## 2024-05-08 DIAGNOSIS — K921 Melena: Secondary | ICD-10-CM | POA: Diagnosis not present

## 2024-05-08 DIAGNOSIS — G2581 Restless legs syndrome: Secondary | ICD-10-CM | POA: Diagnosis present

## 2024-05-08 DIAGNOSIS — Z79899 Other long term (current) drug therapy: Secondary | ICD-10-CM

## 2024-05-08 DIAGNOSIS — Z96652 Presence of left artificial knee joint: Secondary | ICD-10-CM | POA: Diagnosis present

## 2024-05-08 DIAGNOSIS — K625 Hemorrhage of anus and rectum: Principal | ICD-10-CM

## 2024-05-08 DIAGNOSIS — Z888 Allergy status to other drugs, medicaments and biological substances status: Secondary | ICD-10-CM | POA: Diagnosis not present

## 2024-05-08 DIAGNOSIS — I10 Essential (primary) hypertension: Secondary | ICD-10-CM | POA: Diagnosis present

## 2024-05-08 DIAGNOSIS — Y92009 Unspecified place in unspecified non-institutional (private) residence as the place of occurrence of the external cause: Secondary | ICD-10-CM

## 2024-05-08 DIAGNOSIS — F17290 Nicotine dependence, other tobacco product, uncomplicated: Secondary | ICD-10-CM | POA: Diagnosis not present

## 2024-05-08 DIAGNOSIS — T454X5A Adverse effect of iron and its compounds, initial encounter: Secondary | ICD-10-CM | POA: Diagnosis present

## 2024-05-08 DIAGNOSIS — Z8711 Personal history of peptic ulcer disease: Secondary | ICD-10-CM | POA: Diagnosis not present

## 2024-05-08 DIAGNOSIS — R195 Other fecal abnormalities: Secondary | ICD-10-CM | POA: Diagnosis present

## 2024-05-08 LAB — TYPE AND SCREEN
ABO/RH(D): O POS
Antibody Screen: NEGATIVE

## 2024-05-08 LAB — CBC WITH DIFFERENTIAL/PLATELET
Abs Immature Granulocytes: 0.03 K/uL (ref 0.00–0.07)
Basophils Absolute: 0 K/uL (ref 0.0–0.1)
Basophils Relative: 0 %
Eosinophils Absolute: 0.2 K/uL (ref 0.0–0.5)
Eosinophils Relative: 2 %
HCT: 30.4 % — ABNORMAL LOW (ref 39.0–52.0)
Hemoglobin: 9.8 g/dL — ABNORMAL LOW (ref 13.0–17.0)
Immature Granulocytes: 0 %
Lymphocytes Relative: 27 %
Lymphs Abs: 2.4 K/uL (ref 0.7–4.0)
MCH: 29.2 pg (ref 26.0–34.0)
MCHC: 32.2 g/dL (ref 30.0–36.0)
MCV: 90.5 fL (ref 80.0–100.0)
Monocytes Absolute: 0.6 K/uL (ref 0.1–1.0)
Monocytes Relative: 6 %
Neutro Abs: 5.7 K/uL (ref 1.7–7.7)
Neutrophils Relative %: 65 %
Platelets: 342 K/uL (ref 150–400)
RBC: 3.36 MIL/uL — ABNORMAL LOW (ref 4.22–5.81)
RDW: 20.5 % — ABNORMAL HIGH (ref 11.5–15.5)
WBC: 8.9 K/uL (ref 4.0–10.5)
nRBC: 0 % (ref 0.0–0.2)

## 2024-05-08 LAB — COMPREHENSIVE METABOLIC PANEL WITH GFR
ALT: 26 U/L (ref 0–44)
AST: 27 U/L (ref 15–41)
Albumin: 3 g/dL — ABNORMAL LOW (ref 3.5–5.0)
Alkaline Phosphatase: 81 U/L (ref 38–126)
Anion gap: 9 (ref 5–15)
BUN: 19 mg/dL (ref 8–23)
CO2: 24 mmol/L (ref 22–32)
Calcium: 8.4 mg/dL — ABNORMAL LOW (ref 8.9–10.3)
Chloride: 107 mmol/L (ref 98–111)
Creatinine, Ser: 1.05 mg/dL (ref 0.61–1.24)
GFR, Estimated: >60 mL/min (ref >60)
Glucose, Bld: 99 mg/dL (ref 70–99)
Potassium: 3.4 mmol/L — ABNORMAL LOW (ref 3.5–5.1)
Sodium: 140 mmol/L (ref 135–145)
Total Bilirubin: 0.7 mg/dL (ref 0.0–1.2)
Total Protein: 5.8 g/dL — ABNORMAL LOW (ref 6.5–8.1)

## 2024-05-08 LAB — TROPONIN I (HIGH SENSITIVITY)
Troponin I (High Sensitivity): 13 ng/L (ref <18)
Troponin I (High Sensitivity): 13 ng/L (ref <18)

## 2024-05-08 MED ORDER — ESOMEPRAZOLE MAGNESIUM 40 MG PO CPDR
40.0000 mg | DELAYED_RELEASE_CAPSULE | Freq: Two times a day (BID) | ORAL | 3 refills | Status: AC
Start: 2024-05-08 — End: 2025-05-08

## 2024-05-08 MED ORDER — PANTOPRAZOLE SODIUM 40 MG IV SOLR
40.0000 mg | Freq: Once | INTRAVENOUS | Status: AC
Start: 2024-05-08 — End: 2024-05-08
  Administered 2024-05-08: 40 mg via INTRAVENOUS
  Filled 2024-05-08: qty 10

## 2024-05-08 NOTE — H&P (Signed)
 History and Physical    David Callahan FMW:[id number] DOB: 03/26/1956 DOA: 05/08/2024  PCP: [name], NP   Chief Complaint:  dark stool  HPI: David Callahan is a 68 y.o. male with medical history significant of hypertension, COPD and asthma who presented to the emergency department due to dark stools.  Patient was recently admitted for a duodenal ulcer and had an EGD 1 month ago.  He was given Protonix .  He has had intermittent dark stools over the last week.  He presented to the ER labs were obtained which showed hemoglobin 9.8 which is higher than it has been in weeks.  Remaining labs showed potassium 3.4, troponin 13.  Patient was admitted for further workup.  I discussed with patient his presentation.  He has been taking iron  supplementation which is likely source of his dark stools.  He endorses compliance with his remaining medications however is unsure if he is taking his Protonix .  I told him that given his dark stools are probably from his iron  and his hemoglobin is stable he can likely manage in outpatient setting.  I told him that I will send him a new prescription for Nexium  40 mg twice daily to his pharmacy and he is supposed to call his primary care doctor tomorrow to schedule repeat hemoglobin check.  Patient endorses understanding and he was discharged.   Review of Systems: Review of Systems  Constitutional: Negative.  Negative for chills, fever and weight loss.  HENT: Negative.    Eyes: Negative.   Respiratory: Negative.    Cardiovascular: Negative.   Gastrointestinal: Negative.   Genitourinary: Negative.   Musculoskeletal: Negative.   Skin: Negative.   Neurological: Negative.   Endo/Heme/Allergies: Negative.   Psychiatric/Behavioral: Negative.       As per HPI otherwise 10 point review of systems negative.   Allergies  Allergen Reactions   Motrin [Ibuprofen] Anaphylaxis and Swelling    Anti-inflammatory analgesics- cause anaphylaxis    Past Medical  History:  Diagnosis Date   Asthma    COPD (chronic obstructive pulmonary disease) (HCC)    Heart murmur    Hypertension    Sleep apnea     Past Surgical History:  Procedure Laterality Date   COLONOSCOPY     ESOPHAGOGASTRODUODENOSCOPY N/A 03/10/2024   Procedure: EGD (ESOPHAGOGASTRODUODENOSCOPY);  Surgeon: David Deatrice FALCON, MD;  [facility];  Service: Endoscopy;  Laterality: N/A;   ESOPHAGOGASTRODUODENOSCOPY N/A 04/06/2024   Procedure: EGD (ESOPHAGOGASTRODUODENOSCOPY);  Surgeon: David David Callahan HERO, MD;  Location: [facility];  Service: Endoscopy;  Laterality: N/A;   JOINT REPLACEMENT Left    knee   KNEE SURGERY       reports that he has been smoking cigarettes and cigars. He has never used smokeless tobacco. He reports that he does not currently use alcohol. He reports that he does not use drugs.  History reviewed. No pertinent family history.  Prior to Admission medications   Medication Sig Start Date End Date Taking? Authorizing Provider  esomeprazole  (NEXIUM ) 40 MG capsule Take 1 capsule (40 mg total) by mouth 2 (two) times daily before a meal. 05/08/24 05/08/25 Yes David Callahan, Lamar, MD  acetaminophen  (TYLENOL ) 500 MG tablet Take 1,000 mg by mouth every 6 (six) hours as needed for mild pain (pain score 1-3).    [provider]  albuterol  (VENTOLIN  HFA) 108 (90 Base) MCG/ACT inhaler Inhale 2 puffs into the lungs every 6 (six) hours as needed for shortness of breath.    [provider]  atorvastatin  (LIPITOR) 40 MG tablet Take 40 mg by mouth daily.    [provider]  benazepril  (LOTENSIN ) 40 MG tablet Take 40 mg by mouth daily. 04/27/24   [provider]  cyclobenzaprine  (FLEXERIL ) 10 MG tablet Take 10 mg by mouth at bedtime. 09/17/20   [provider]  diltiazem  (CARDIZEM  CD) 360 MG 24 hr capsule Take 360 mg by mouth daily. 02/16/24   [provider]  docusate sodium  (COLACE) 100 MG capsule Take 1 capsule (100 mg total) by  mouth 2 (two) times daily. 04/07/24 04/07/25  Callahan, David D, DO  feeding supplement, ENSURE COMPLETE, (ENSURE COMPLETE) LIQD Take 237 mLs by mouth 2 (two) times daily between meals. 03/12/24   David Lenis, MD  ferrous sulfate  325 (65 FE) MG tablet Take 325 mg by mouth daily with breakfast.    [provider]  HYDROcodone -acetaminophen  (NORCO) 10-325 MG tablet Take 1 tablet by mouth every 4 (four) hours as needed for moderate pain (pain score 4-6). 02/10/24   [provider]  rOPINIRole  (REQUIP ) 1 MG tablet Take 1 mg by mouth daily. 02/16/24   [provider]  sucralfate  (CARAFATE ) 1 GM/10ML suspension Take 10 mLs (1 g total) by mouth 4 (four) times daily -  with meals and at bedtime for 28 days. 04/07/24 05/05/24  Callahan, David D, DO  terazosin  (HYTRIN ) 10 MG capsule Take 10 mg by mouth daily. 02/16/24   [provider]  traZODone  (DESYREL ) 150 MG tablet Take 150 mg by mouth at bedtime as needed for sleep. 02/16/24   [provider]  umeclidinium bromide  (INCRUSE ELLIPTA ) 62.5 MCG/ACT AEPB Inhale 1 puff into the lungs daily. 06/23/22   Bryn Bernardino NOVAK, MD    Physical Exam: Vitals:   05/08/24 1915 05/08/24 2015 05/08/24 2058 05/08/24 2126  BP: (!) 184/118 (!) 165/75  (!) 160/70  Pulse: 70 63  62  Resp:  15  16  Temp:   98.2 F (36.8 C)   TempSrc:   Oral   SpO2: 95% 98%  99%  Weight:      Height:       Physical Exam Constitutional:      Appearance: He is normal weight.  HENT:     Head: Normocephalic.     Nose: Nose normal.     Mouth/Throat:     Mouth: Mucous membranes are moist.     Pharynx: Oropharynx is clear.  Eyes:     Conjunctiva/sclera: Conjunctivae normal.     Pupils: Pupils are equal, round, and reactive to light.  Cardiovascular:     Rate and Rhythm: Normal rate and regular rhythm.     Pulses: Normal pulses.     Heart sounds: Normal heart sounds.  Pulmonary:     Effort: Pulmonary effort is normal.     Breath sounds: Normal breath sounds.   Abdominal:     General: Abdomen is flat.     Palpations: Abdomen is soft.  Musculoskeletal:        General: Normal range of motion.     Cervical back: Normal range of motion.  Skin:    General: Skin is warm.     Capillary Refill: Capillary refill takes less than 2 seconds.  Neurological:     General: No focal deficit present.     Mental Status: He is alert.  Psychiatric:        Mood and Affect: Mood normal.        Labs on Admission:  I have personally reviewed the patients's labs and imaging studies.  Assessment/Plan Principal Problem:   Melena   # Dark stools - Start stools likely secondary to iron  supplementation Endorses compliance with medications - Hemoglobin stable at 9.8  Plan: Follow-up primary care doctor for repeat hemoglobin Start Nexium  40 mg twice daily  # Hyperlipidemia-continue Lipitor  # Hypertension-continue ACE inhibitor, diltiazem   # COPD-continue inhalers  # Insomnia-continue trazodone   # RLS-continue ropinirole    Admission status: Inpatient Med-Surg  Certification: The appropriate patient status for this patient is INPATIENT. Inpatient status is judged to be reasonable and necessary in order to provide the required intensity of service to ensure the patient's safety. The patient's presenting symptoms, physical exam findings, and initial radiographic and laboratory data in the context of their chronic comorbidities is felt to place them at high risk for further clinical deterioration. Furthermore, it is not anticipated that the patient will be medically stable for discharge from the hospital within 2 midnights of admission.   * I certify that at the point of admission it is my clinical judgment that the patient will require inpatient hospital care spanning beyond 2 midnights from the point of admission due to high intensity of service, high risk for further deterioration and high frequency of surveillance required.DEWAINE David Callahan Dess  MD [facility] If 7PM-7AM, please contact night-coverage [email or web address]  05/08/2024, 10:23 PM

## 2024-05-08 NOTE — ED Provider Notes (Signed)
 [place] [facility] AT [facility] Provider Note   CSN: [id number] Arrival date & time: 05/08/24  1801     Patient presents with: Rectal Bleeding   David Callahan is a 68 y.o. male history of hypertension, COPD presents with complaints of bloody stools.  Stools are described as black.  States this started up again today.  Had a recent admission for melena, requiring 2 units PRBC transfusion, found to have PUD and duodenal ulcer on EGD on 6/25 and discharged on 6/26.  He reports compliance with his medications.  Denies NSAID or blood thinner use.     Rectal Bleeding  Past Medical History:  Diagnosis Date   Asthma    COPD (chronic obstructive pulmonary disease) (HCC)    Heart murmur    Hypertension    Sleep apnea    Past Surgical History:  Procedure Laterality Date   COLONOSCOPY     ESOPHAGOGASTRODUODENOSCOPY N/A 03/10/2024   Procedure: EGD (ESOPHAGOGASTRODUODENOSCOPY);  Surgeon: Cinderella Deatrice FALCON, MD;  Location: [facility];  Service: Endoscopy;  Laterality: N/A;   ESOPHAGOGASTRODUODENOSCOPY N/A 04/06/2024   Procedure: EGD (ESOPHAGOGASTRODUODENOSCOPY);  Surgeon: Shaaron Lamar HERO, MD;  Location: [facility];  Service: Endoscopy;  Laterality: N/A;   JOINT REPLACEMENT Left    knee   KNEE SURGERY         Prior to Admission medications   Medication Sig Start Date End Date Taking? Authorizing Provider  acetaminophen  (TYLENOL ) 500 MG tablet Take 1,000 mg by mouth every 6 (six) hours as needed for mild pain (pain score 1-3).    [provider]  albuterol  (VENTOLIN  HFA) 108 (90 Base) MCG/ACT inhaler Inhale 2 puffs into the lungs every 6 (six) hours as needed for shortness of breath.    [provider]  atorvastatin  (LIPITOR) 40 MG tablet Take 40 mg by mouth daily.    [provider]  benazepril  (LOTENSIN ) 40 MG tablet Take 40 mg by mouth daily. 04/27/24   [provider]  cyclobenzaprine  (FLEXERIL ) 10 MG tablet Take 10 mg  by mouth at bedtime. 09/17/20   [provider]  diltiazem  (CARDIZEM  CD) 360 MG 24 hr capsule Take 360 mg by mouth daily. 02/16/24   [provider]  docusate sodium  (COLACE) 100 MG capsule Take 1 capsule (100 mg total) by mouth 2 (two) times daily. 04/07/24 04/07/25  Maree, Pratik D, DO  feeding supplement, ENSURE COMPLETE, (ENSURE COMPLETE) LIQD Take 237 mLs by mouth 2 (two) times daily between meals. 03/12/24   Evonnie Lenis, MD  ferrous sulfate  325 (65 FE) MG tablet Take 325 mg by mouth daily with breakfast.    [provider]  HYDROcodone -acetaminophen  (NORCO) 10-325 MG tablet Take 1 tablet by mouth every 4 (four) hours as needed for moderate pain (pain score 4-6). 02/10/24   [provider]  pantoprazole  (PROTONIX ) 40 MG tablet Take 1 tablet (40 mg total) by mouth 2 (two) times daily. 03/12/24   Evonnie Lenis, MD  rOPINIRole  (REQUIP ) 1 MG tablet Take 1 mg by mouth daily. 02/16/24   [provider]  sucralfate  (CARAFATE ) 1 GM/10ML suspension Take 10 mLs (1 g total) by mouth 4 (four) times daily -  with meals and at bedtime for 28 days. 04/07/24 05/05/24  Maree, Pratik D, DO  terazosin  (HYTRIN ) 10 MG capsule Take 10 mg by mouth daily. 02/16/24   [provider]  traZODone  (DESYREL ) 150 MG tablet Take 150 mg by mouth at bedtime as needed for sleep. 02/16/24  [provider]  umeclidinium bromide  (INCRUSE ELLIPTA ) 62.5 MCG/ACT AEPB Inhale 1 puff into the lungs daily. 06/23/22   Bryn Bernardino NOVAK, MD    Allergies: Motrin [ibuprofen]    Review of Systems  Gastrointestinal:  Positive for hematochezia.    Updated Vital Signs BP (!) 165/75   Pulse 63   Temp 98.2 F (36.8 C) (Oral)   Resp 15   Ht 5' 9 (1.753 m)   Wt 74.8 kg   SpO2 98%   BMI 24.37 kg/m   Physical Exam Vitals and nursing note reviewed.  Constitutional:      General: He is not in acute distress.    Appearance: He is well-developed.  HENT:     Head: Normocephalic and atraumatic.   Eyes:     Conjunctiva/sclera: Conjunctivae normal.  Cardiovascular:     Rate and Rhythm: Normal rate and regular rhythm.     Heart sounds: No murmur heard. Pulmonary:     Effort: Pulmonary effort is normal. No respiratory distress.     Breath sounds: Normal breath sounds.  Abdominal:     Palpations: Abdomen is soft.     Tenderness: There is no abdominal tenderness.  Musculoskeletal:        General: No swelling.     Cervical back: Neck supple.  Skin:    General: Skin is warm and dry.     Capillary Refill: Capillary refill takes less than 2 seconds.  Neurological:     Mental Status: He is alert.  Psychiatric:        Mood and Affect: Mood normal.     (all labs ordered are listed, but only abnormal results are displayed) Labs Reviewed  CBC WITH DIFFERENTIAL/PLATELET - Abnormal; Notable for the following components:      Result Value   RBC 3.36 (*)    Hemoglobin 9.8 (*)    HCT 30.4 (*)    RDW 20.5 (*)    All other components within normal limits  COMPREHENSIVE METABOLIC PANEL WITH GFR - Abnormal; Notable for the following components:   Potassium 3.4 (*)    Calcium  8.4 (*)    Total Protein 5.8 (*)    Albumin 3.0 (*)    All other components within normal limits  POC OCCULT BLOOD, ED  TYPE AND SCREEN  TROPONIN I (HIGH SENSITIVITY)  TROPONIN I (HIGH SENSITIVITY)    EKG: None  Radiology: No results found.   Procedures   Medications Ordered in the ED  pantoprazole  (PROTONIX ) injection 40 mg (40 mg Intravenous Given 05/08/24 2055)    Clinical Course as of 05/08/24 2106  Sun May 08, 2024  1944 EKG not crossing over into MUSE.  Interpreted by me in the absence of cardiology and shows sinus rhythm, rate 64, baseline wander, nonspecific ST changes [MK]    Clinical Course User Index [MK] Gennaro Duwaine CROME, DO                                 Medical Decision Making Amount and/or Complexity of Data Reviewed Labs: ordered.   This patient presents to the ED with  chief complaint(s) of melena.  The complaint involves an extensive differential diagnosis and also carries with it a high risk of complications and morbidity.   Pertinent past medical history as listed in HPI  The differential diagnosis includes  PUD/GI bleed, anemia Additional history obtained: Records reviewed Care Everywhere/External Records  Assessment and management:  Nontoxic appearing patient presenting with complaints of melena that started up again today.  He has had multiple admissions for this requiring blood transfusions.  EGD on 5/29 showed esophagitis, gastritis, nonobstructing large duodenal bulb ulcer.  EGD on 6/25 demonstrated PUD with duodenal ulcer with no significant bleeds.  He reports compliance with taking iron  daily, PPI twice daily and Carafate  1G 4 times daily.  During his visit here patient started reporting chest heaviness with shortness of breath and nausea.  EKG appears unchanged.  Will add on troponins.  Independent ECG interpretation:  Normal sinus rhythm, minimal ST depressions appear unchanged  Independent labs interpretation:  The following labs were independently interpreted:  CBC with stable hemoglobin of 9.8, CMP without significant abnormality, Hemoccult positive, troponin without elevation  Independent visualization and interpretation of imaging: I independently visualized the following imaging with scope of interpretation limited to determining acute life threatening conditions related to emergency care: none    Consultations obtained:   Gastroenterology NPO, clear diet, IV 40 Pantoprozole BID Hospitalist Dr. Dena, agreed for admission  Disposition:   Patient be admitted for further workup and management.  Social Determinants of Health:   none  This note was dictated with voice recognition software.  Despite best efforts at proofreading, errors may have occurred which can change the documentation meaning.       Final diagnoses:   Rectal bleeding    ED Discharge Orders     None          Donnajean Lynwood VEAR DEVONNA 05/08/24 2106    Kammerer, Megan L, DO 05/09/24 2007

## 2024-05-08 NOTE — Discharge Instructions (Signed)
 FU with PCP tomorrow STOP Protonix  Start nexium  40mg  twice daily, sent to [facility]

## 2024-05-08 NOTE — ED Triage Notes (Signed)
 Pt arrives with reports of dark stool at home starting today reports history of internal bleeding.

## 2024-05-08 NOTE — ED Notes (Signed)
 Pt states MD told him he could be managed as an outpatient. Pt is refusing admission. MD aware and will consult with patient. 300 updated on status.

## 2024-05-09 ENCOUNTER — Telehealth: Payer: Self-pay | Admitting: Gastroenterology

## 2024-05-09 NOTE — Telephone Encounter (Signed)
 Patient was in ED over the weekend but went home. Needs follow-up with [facility], as he has been seeing her. He is a patient of Dr. Eartha, so if he has any appts, that's ok also.  If no appts in about 2 weeks, please put with any available APP.

## 2024-05-10 NOTE — Discharge Summary (Signed)
 Physician Discharge Summary   Patient: David Callahan MRN: [id number] DOB: October 17, 1955  Admit date:     05/08/2024  Discharge date: 05/08/2024  Discharge Physician: David Callahan   PCP: David Morna FALCON, NP   Recommendations at discharge:    FU with PCP  Discharge Diagnoses: Principal Problem:   Melena  Resolved Problems:   * No resolved hospital problems. [facility] Course: No notes on file  Assessment and Plan: No notes have been filed under this hospital service. Service: Hospitalist David Callahan is a 68 y.o. male with medical history significant of hypertension, COPD and asthma who presented to the emergency department due to dark stools.  Patient was recently admitted for a duodenal ulcer and had an EGD 1 month ago.  He was given Protonix .  He has had intermittent dark stools over the last week.  He presented to the ER labs were obtained which showed hemoglobin 9.8 which is higher than it has been in weeks.  Remaining labs showed potassium 3.4, troponin 13.  Patient was admitted for further workup.  I discussed with patient his presentation.  He has been taking iron  supplementation which is likely source of his dark stools.  He endorses compliance with his remaining medications however is unsure if he is taking his Protonix .  I told him that given his dark stools are probably from his iron  and his hemoglobin is stable he can likely manage in outpatient setting.  I told him that I will send him a new prescription for Nexium  40 mg twice daily to his pharmacy and he is supposed to call his primary care doctor tomorrow to schedule repeat hemoglobin check.  Patient endorses understanding and he was discharged.       DISCHARGE MEDICATION: Allergies as of 05/08/2024       Reactions   Motrin [ibuprofen] Anaphylaxis, Swelling   Anti-inflammatory analgesics- cause anaphylaxis        Medication List     STOP taking these medications    pantoprazole  40 MG tablet Commonly  known as: PROTONIX        TAKE these medications    acetaminophen  500 MG tablet Commonly known as: TYLENOL  Take 1,000 mg by mouth every 6 (six) hours as needed for mild pain (pain score 1-3).   albuterol  108 (90 Base) MCG/ACT inhaler Commonly known as: VENTOLIN  HFA Inhale 2 puffs into the lungs every 6 (six) hours as needed for shortness of breath.   atorvastatin  40 MG tablet Commonly known as: LIPITOR Take 40 mg by mouth daily.   benazepril  40 MG tablet Commonly known as: LOTENSIN  Take 40 mg by mouth daily.   cyclobenzaprine  10 MG tablet Commonly known as: FLEXERIL  Take 10 mg by mouth at bedtime.   diltiazem  360 MG 24 hr capsule Commonly known as: CARDIZEM  CD Take 360 mg by mouth daily.   docusate sodium  100 MG capsule Commonly known as: Colace Take 1 capsule (100 mg total) by mouth 2 (two) times daily.   esomeprazole  40 MG capsule Commonly known as: NexIUM  Take 1 capsule (40 mg total) by mouth 2 (two) times daily before a meal.   feeding supplement (ENSURE COMPLETE) Liqd Take 237 mLs by mouth 2 (two) times daily between meals.   ferrous sulfate  325 (65 FE) MG tablet Take 325 mg by mouth daily with breakfast.   HYDROcodone -acetaminophen  10-325 MG tablet Commonly known as: NORCO Take 1 tablet by mouth every 4 (four) hours as needed for moderate pain (pain score 4-6).  Incruse Ellipta  62.5 MCG/ACT Aepb Generic drug: umeclidinium bromide  Inhale 1 puff into the lungs daily.   rOPINIRole  1 MG tablet Commonly known as: REQUIP  Take 1 mg by mouth daily.   sucralfate  1 GM/10ML suspension Commonly known as: CARAFATE  Take 10 mLs (1 g total) by mouth 4 (four) times daily -  with meals and at bedtime for 28 days.   terazosin  10 MG capsule Commonly known as: HYTRIN  Take 10 mg by mouth daily.   traZODone  150 MG tablet Commonly known as: DESYREL  Take 150 mg by mouth at bedtime as needed for sleep.        Discharge Exam: Filed Weights   05/08/24 1812   Weight: 74.8 kg    Condition at discharge: good  The results of significant diagnostics from this hospitalization (including imaging, microbiology, ancillary and laboratory) are listed below for reference.   Imaging Studies: No results found.  Microbiology: Results for orders placed or performed during the hospital encounter of 06/14/22  Culture, blood (Routine X 2) w Reflex to ID Panel     Status: None   Collection Time: 06/14/22  8:50 AM   Specimen: Left Antecubital; Blood  Result Value Ref Range Status   Specimen Description   Final    LEFT ANTECUBITAL BOTTLES DRAWN AEROBIC AND ANAEROBIC   Special Requests Blood Culture adequate volume  Final   Culture   Final    NO GROWTH 5 DAYS Performed at [facility], [address], [place], [place] [address]    Report Status 06/19/2022 FINAL  Final  SARS Coronavirus 2 by RT PCR (hospital order, performed in [facility] hospital lab) *cepheid single result test* Anterior Nasal Swab     Status: None   Collection Time: 06/14/22  8:58 AM   Specimen: Anterior Nasal Swab  Result Value Ref Range Status   SARS Coronavirus 2 by RT PCR NEGATIVE NEGATIVE Final    Comment: (NOTE) SARS-CoV-2 target nucleic acids are NOT DETECTED.  The SARS-CoV-2 RNA is generally detectable in upper and lower respiratory specimens during the acute phase of infection. The lowest concentration of SARS-CoV-2 viral copies this assay can detect is 250 copies / mL. A negative result does not preclude SARS-CoV-2 infection and should not be used as the sole basis for treatment or other patient management decisions.  A negative result may occur with improper specimen collection / handling, submission of specimen other than nasopharyngeal swab, presence of viral mutation(s) within the areas targeted by this assay, and inadequate number of viral copies (<250 copies / mL). A negative result must be combined with clinical observations, patient history, and epidemiological  information.  Fact Sheet for Patients:   RoadLapTop.co.za  Fact Sheet for Healthcare Providers: http://kim-miller.com/  This test is not yet approved or  cleared by the [place]  FDA and has been authorized for detection and/or diagnosis of SARS-CoV-2 by FDA under an Emergency Use Authorization (EUA).  This EUA will remain in effect (meaning this test can be used) for the duration of the COVID-19 declaration under Section 564(b)(1) of the Act, 21 U.S.C. section 360bbb-3(b)(1), unless the authorization is terminated or revoked sooner.  Performed at [facility], [address], [place], [place] [address]   Culture, blood (Routine X 2) w Reflex to ID Panel     Status: None   Collection Time: 06/14/22  9:09 AM   Specimen: BLOOD RIGHT HAND  Result Value Ref Range Status   Specimen Description   Final    BLOOD RIGHT HAND  BOTTLES DRAWN AEROBIC AND ANAEROBIC   Special Requests   Final    Blood Culture results may not be optimal due to an excessive volume of blood received in culture bottles   Culture   Final    NO GROWTH 5 DAYS Performed at [facility], [address], [place], [place] [address]    Report Status 06/19/2022 FINAL  Final  MRSA Next Gen by PCR, Nasal     Status: Abnormal   Collection Time: 06/14/22  9:45 AM   Specimen: Nasal Mucosa; Nasal Swab  Result Value Ref Range Status   MRSA by PCR Next Gen DETECTED (A) NOT DETECTED Final    Comment: RESULT CALLED TO, READ BACK BY AND VERIFIED WITH: EANES M @ 1138 ON [id number] BY HENDERSON L (NOTE) The GeneXpert MRSA Assay (FDA approved for NASAL specimens only), is one component of a comprehensive MRSA colonization surveillance program. It is not intended to diagnose MRSA infection nor to guide or monitor treatment for MRSA infections. Test performance is not FDA approved in patients less than 74 years old. Performed at [facility], [address] [place], [place] [address]    Respiratory (~20 pathogens) panel by PCR     Status: None   Collection Time: 06/14/22  9:30 PM   Specimen: Nasopharyngeal Swab; Respiratory  Result Value Ref Range Status   Adenovirus NOT DETECTED NOT DETECTED Final   Coronavirus 229E NOT DETECTED NOT DETECTED Final    Comment: (NOTE) The Coronavirus on the Respiratory Panel, DOES NOT test for the novel  Coronavirus (2019 nCoV)    Coronavirus HKU1 NOT DETECTED NOT DETECTED Final   Coronavirus NL63 NOT DETECTED NOT DETECTED Final   Coronavirus OC43 NOT DETECTED NOT DETECTED Final   Metapneumovirus NOT DETECTED NOT DETECTED Final   Rhinovirus / Enterovirus NOT DETECTED NOT DETECTED Final   Influenza A NOT DETECTED NOT DETECTED Final   Influenza B NOT DETECTED NOT DETECTED Final   Parainfluenza Virus 1 NOT DETECTED NOT DETECTED Final   Parainfluenza Virus 2 NOT DETECTED NOT DETECTED Final   Parainfluenza Virus 3 NOT DETECTED NOT DETECTED Final   Parainfluenza Virus 4 NOT DETECTED NOT DETECTED Final   Respiratory Syncytial Virus NOT DETECTED NOT DETECTED Final   Bordetella pertussis NOT DETECTED NOT DETECTED Final   Bordetella Parapertussis NOT DETECTED NOT DETECTED Final   Chlamydophila pneumoniae NOT DETECTED NOT DETECTED Final   Mycoplasma pneumoniae NOT DETECTED NOT DETECTED Final    Comment: Performed at [facility], [address], [place], [place] [address]  Aerobic/Anaerobic Culture w Gram Stain (surgical/deep wound)     Status: None   Collection Time: 06/16/22  1:14 PM   Specimen: Lung  Result Value Ref Range Status   Specimen Description LUNG  Final   Special Requests NONE  Final   Gram Stain   Final    RARE WBC PRESENT,BOTH PMN AND MONONUCLEAR NO ORGANISMS SEEN    Culture   Final    No growth aerobically or anaerobically. Performed at [facility], [address] [place], [place] [address]    Report Status 06/21/2022 FINAL  Final  Body fluid culture w Gram Stain     Status: None   Collection Time:  06/16/22  4:55 PM   Specimen: Pleural Fluid  Result Value Ref Range Status   Specimen Description PLEURAL  Final   Special Requests NONE  Final   Gram Stain NO WBC SEEN NO ORGANISMS SEEN   Final  Culture   Final    NO GROWTH 3 DAYS Performed at [facility], [address], [place], [place] [address]    Report Status 06/20/2022 FINAL  Final    Labs: CBC: Recent Labs  Lab 05/08/24 1835  WBC 8.9  NEUTROABS 5.7  HGB 9.8*  HCT 30.4*  MCV 90.5  PLT 342   Basic Metabolic Panel: Recent Labs  Lab 05/08/24 1835  NA 140  K 3.4*  CL 107  CO2 24  GLUCOSE 99  BUN 19  CREATININE 1.05  CALCIUM  8.4*   Liver Function Tests: Recent Labs  Lab 05/08/24 1835  AST 27  ALT 26  ALKPHOS 81  BILITOT 0.7  PROT 5.8*  ALBUMIN 3.0*   CBG: No results for input(s): GLUCAP in the last 168 hours.  Discharge time spent: less than 30 minutes.  Signed: Lamar Dess, MD [facility] 05/10/2024

## 2024-05-18 ENCOUNTER — Other Ambulatory Visit: Payer: Self-pay

## 2024-05-18 ENCOUNTER — Encounter (HOSPITAL_COMMUNITY): Payer: Self-pay | Admitting: Family Medicine

## 2024-05-18 ENCOUNTER — Inpatient Hospital Stay (HOSPITAL_COMMUNITY)
Admission: EM | Admit: 2024-05-18 | Discharge: 2024-05-26 | DRG: 353 | Disposition: A | Discharge status: Home Health | Attending: Internal Medicine | Admitting: Internal Medicine

## 2024-05-18 DIAGNOSIS — D500 Iron deficiency anemia secondary to blood loss (chronic): Secondary | ICD-10-CM | POA: Diagnosis not present

## 2024-05-18 DIAGNOSIS — F17210 Nicotine dependence, cigarettes, uncomplicated: Secondary | ICD-10-CM | POA: Diagnosis present

## 2024-05-18 DIAGNOSIS — J4489 Other specified chronic obstructive pulmonary disease: Secondary | ICD-10-CM | POA: Diagnosis present

## 2024-05-18 DIAGNOSIS — Z604 Social exclusion and rejection: Secondary | ICD-10-CM | POA: Diagnosis present

## 2024-05-18 DIAGNOSIS — Z7951 Long term (current) use of inhaled steroids: Secondary | ICD-10-CM

## 2024-05-18 DIAGNOSIS — Z96652 Presence of left artificial knee joint: Secondary | ICD-10-CM | POA: Diagnosis present

## 2024-05-18 DIAGNOSIS — F17290 Nicotine dependence, other tobacco product, uncomplicated: Secondary | ICD-10-CM | POA: Diagnosis present

## 2024-05-18 DIAGNOSIS — N179 Acute kidney failure, unspecified: Secondary | ICD-10-CM | POA: Diagnosis present

## 2024-05-18 DIAGNOSIS — I10 Essential (primary) hypertension: Secondary | ICD-10-CM | POA: Diagnosis present

## 2024-05-18 DIAGNOSIS — D62 Acute posthemorrhagic anemia: Secondary | ICD-10-CM | POA: Diagnosis present

## 2024-05-18 DIAGNOSIS — I2510 Atherosclerotic heart disease of native coronary artery without angina pectoris: Secondary | ICD-10-CM | POA: Diagnosis not present

## 2024-05-18 DIAGNOSIS — K922 Gastrointestinal hemorrhage, unspecified: Secondary | ICD-10-CM | POA: Diagnosis present

## 2024-05-18 DIAGNOSIS — I469 Cardiac arrest, cause unspecified: Secondary | ICD-10-CM | POA: Diagnosis not present

## 2024-05-18 DIAGNOSIS — E663 Overweight: Secondary | ICD-10-CM | POA: Diagnosis present

## 2024-05-18 DIAGNOSIS — Z886 Allergy status to analgesic agent status: Secondary | ICD-10-CM

## 2024-05-18 DIAGNOSIS — D649 Anemia, unspecified: Secondary | ICD-10-CM

## 2024-05-18 DIAGNOSIS — E872 Acidosis, unspecified: Secondary | ICD-10-CM | POA: Diagnosis present

## 2024-05-18 DIAGNOSIS — Z8711 Personal history of peptic ulcer disease: Secondary | ICD-10-CM | POA: Diagnosis not present

## 2024-05-18 DIAGNOSIS — K920 Hematemesis: Secondary | ICD-10-CM | POA: Diagnosis not present

## 2024-05-18 DIAGNOSIS — J449 Chronic obstructive pulmonary disease, unspecified: Secondary | ICD-10-CM | POA: Diagnosis not present

## 2024-05-18 DIAGNOSIS — K264 Chronic or unspecified duodenal ulcer with hemorrhage: Secondary | ICD-10-CM | POA: Diagnosis present

## 2024-05-18 DIAGNOSIS — E785 Hyperlipidemia, unspecified: Secondary | ICD-10-CM | POA: Diagnosis present

## 2024-05-18 DIAGNOSIS — T39395A Adverse effect of other nonsteroidal anti-inflammatory drugs [NSAID], initial encounter: Secondary | ICD-10-CM | POA: Diagnosis present

## 2024-05-18 DIAGNOSIS — R739 Hyperglycemia, unspecified: Secondary | ICD-10-CM

## 2024-05-18 DIAGNOSIS — K269 Duodenal ulcer, unspecified as acute or chronic, without hemorrhage or perforation: Secondary | ICD-10-CM

## 2024-05-18 DIAGNOSIS — Z9889 Other specified postprocedural states: Secondary | ICD-10-CM | POA: Diagnosis not present

## 2024-05-18 DIAGNOSIS — R578 Other shock: Secondary | ICD-10-CM | POA: Diagnosis not present

## 2024-05-18 DIAGNOSIS — N400 Enlarged prostate without lower urinary tract symptoms: Secondary | ICD-10-CM | POA: Diagnosis present

## 2024-05-18 DIAGNOSIS — D72829 Elevated white blood cell count, unspecified: Secondary | ICD-10-CM | POA: Diagnosis present

## 2024-05-18 DIAGNOSIS — J9601 Acute respiratory failure with hypoxia: Secondary | ICD-10-CM | POA: Diagnosis not present

## 2024-05-18 DIAGNOSIS — E861 Hypovolemia: Secondary | ICD-10-CM | POA: Diagnosis present

## 2024-05-18 DIAGNOSIS — G4733 Obstructive sleep apnea (adult) (pediatric): Secondary | ICD-10-CM | POA: Diagnosis present

## 2024-05-18 DIAGNOSIS — K921 Melena: Secondary | ICD-10-CM

## 2024-05-18 DIAGNOSIS — Z79899 Other long term (current) drug therapy: Secondary | ICD-10-CM

## 2024-05-18 DIAGNOSIS — E8809 Other disorders of plasma-protein metabolism, not elsewhere classified: Secondary | ICD-10-CM | POA: Diagnosis present

## 2024-05-18 DIAGNOSIS — D509 Iron deficiency anemia, unspecified: Secondary | ICD-10-CM

## 2024-05-18 DIAGNOSIS — I509 Heart failure, unspecified: Secondary | ICD-10-CM | POA: Diagnosis not present

## 2024-05-18 DIAGNOSIS — K219 Gastro-esophageal reflux disease without esophagitis: Secondary | ICD-10-CM | POA: Diagnosis present

## 2024-05-18 DIAGNOSIS — I110 Hypertensive heart disease with heart failure: Secondary | ICD-10-CM | POA: Diagnosis not present

## 2024-05-18 DIAGNOSIS — E8770 Fluid overload, unspecified: Secondary | ICD-10-CM | POA: Diagnosis present

## 2024-05-18 DIAGNOSIS — Z791 Long term (current) use of non-steroidal anti-inflammatories (NSAID): Secondary | ICD-10-CM

## 2024-05-18 DIAGNOSIS — Z6825 Body mass index (BMI) 25.0-25.9, adult: Secondary | ICD-10-CM

## 2024-05-18 LAB — COMPREHENSIVE METABOLIC PANEL WITH GFR
ALT: 20 U/L (ref 0–44)
AST: 25 U/L (ref 15–41)
Albumin: 2.4 g/dL — ABNORMAL LOW (ref 3.5–5.0)
Alkaline Phosphatase: 68 U/L (ref 38–126)
Anion gap: 10 (ref 5–15)
BUN: 52 mg/dL — ABNORMAL HIGH (ref 8–23)
CO2: 22 mmol/L (ref 22–32)
Calcium: 8.2 mg/dL — ABNORMAL LOW (ref 8.9–10.3)
Chloride: 110 mmol/L (ref 98–111)
Creatinine, Ser: 1.36 mg/dL — ABNORMAL HIGH (ref 0.61–1.24)
GFR, Estimated: 57 mL/min — ABNORMAL LOW (ref >60)
Glucose, Bld: 118 mg/dL — ABNORMAL HIGH (ref 70–99)
Potassium: 5.1 mmol/L (ref 3.5–5.1)
Sodium: 142 mmol/L (ref 135–145)
Total Bilirubin: 0.4 mg/dL (ref 0.0–1.2)
Total Protein: 5 g/dL — ABNORMAL LOW (ref 6.5–8.1)

## 2024-05-18 LAB — CBC
HCT: 19.1 % — ABNORMAL LOW (ref 39.0–52.0)
HCT: 21 % — ABNORMAL LOW (ref 39.0–52.0)
Hemoglobin: 6.2 g/dL — CL (ref 13.0–17.0)
Hemoglobin: 6.7 g/dL — CL (ref 13.0–17.0)
MCH: 30.5 pg (ref 26.0–34.0)
MCH: 29.5 pg (ref 26.0–34.0)
MCHC: 32.5 g/dL (ref 30.0–36.0)
MCHC: 31.9 g/dL (ref 30.0–36.0)
MCV: 94.1 fL (ref 80.0–100.0)
MCV: 92.5 fL (ref 80.0–100.0)
Platelets: 274 K/uL (ref 150–400)
Platelets: 273 K/uL (ref 150–400)
RBC: 2.03 MIL/uL — ABNORMAL LOW (ref 4.22–5.81)
RBC: 2.27 MIL/uL — ABNORMAL LOW (ref 4.22–5.81)
RDW: 18.8 % — ABNORMAL HIGH (ref 11.5–15.5)
RDW: 17.9 % — ABNORMAL HIGH (ref 11.5–15.5)
WBC: 11.8 K/uL — ABNORMAL HIGH (ref 4.0–10.5)
WBC: 12.3 K/uL — ABNORMAL HIGH (ref 4.0–10.5)
nRBC: 0 % (ref 0.0–0.2)
nRBC: 0 % (ref 0.0–0.2)

## 2024-05-18 LAB — CBC WITH DIFFERENTIAL/PLATELET
Abs Immature Granulocytes: 0.18 K/uL — ABNORMAL HIGH (ref 0.00–0.07)
Basophils Absolute: 0 K/uL (ref 0.0–0.1)
Basophils Relative: 0 %
Eosinophils Absolute: 0 K/uL (ref 0.0–0.5)
Eosinophils Relative: 0 %
HCT: 16.8 % — ABNORMAL LOW (ref 39.0–52.0)
Hemoglobin: 5.3 g/dL — CL (ref 13.0–17.0)
Immature Granulocytes: 1 %
Lymphocytes Relative: 9 %
Lymphs Abs: 1.4 K/uL (ref 0.7–4.0)
MCH: 29.9 pg (ref 26.0–34.0)
MCHC: 31.5 g/dL (ref 30.0–36.0)
MCV: 94.9 fL (ref 80.0–100.0)
Monocytes Absolute: 0.6 K/uL (ref 0.1–1.0)
Monocytes Relative: 4 %
Neutro Abs: 14.2 K/uL — ABNORMAL HIGH (ref 1.7–7.7)
Neutrophils Relative %: 86 %
Platelets: 342 K/uL (ref 150–400)
RBC: 1.77 MIL/uL — ABNORMAL LOW (ref 4.22–5.81)
RDW: 21.4 % — ABNORMAL HIGH (ref 11.5–15.5)
Smear Review: NORMAL
WBC: 16.4 K/uL — ABNORMAL HIGH (ref 4.0–10.5)
nRBC: 0 % (ref 0.0–0.2)

## 2024-05-18 LAB — IRON AND TIBC
Iron: 24 ug/dL — ABNORMAL LOW (ref 45–182)
Saturation Ratios: 12 % — ABNORMAL LOW (ref 17.9–39.5)
TIBC: 195 ug/dL — ABNORMAL LOW (ref 250–450)
UIBC: 171 ug/dL

## 2024-05-18 LAB — LIPASE, BLOOD: Lipase: 26 U/L (ref 11–51)

## 2024-05-18 LAB — HEMOGLOBIN AND HEMATOCRIT, BLOOD
HCT: 19 % — ABNORMAL LOW (ref 39.0–52.0)
Hemoglobin: 6.2 g/dL — CL (ref 13.0–17.0)

## 2024-05-18 LAB — MRSA NEXT GEN BY PCR, NASAL: MRSA by PCR Next Gen: NOT DETECTED

## 2024-05-18 LAB — PREPARE RBC (CROSSMATCH)

## 2024-05-18 LAB — PROTIME-INR
INR: 1.2 (ref 0.8–1.2)
Prothrombin Time: 16 s — ABNORMAL HIGH (ref 11.4–15.2)

## 2024-05-18 LAB — FOLATE: Folate: 7.9 ng/mL (ref >5.9)

## 2024-05-18 LAB — VITAMIN B12: Vitamin B-12: 156 pg/mL — ABNORMAL LOW (ref 180–914)

## 2024-05-18 MED ORDER — PROCHLORPERAZINE EDISYLATE 10 MG/2ML IJ SOLN
5.0000 mg | Freq: Four times a day (QID) | INTRAMUSCULAR | Status: DC | PRN
  Administered 2024-05-19: 5 mg via INTRAVENOUS
  Filled 2024-05-18: qty 2

## 2024-05-18 MED ORDER — ATORVASTATIN CALCIUM 40 MG PO TABS
40.0000 mg | ORAL_TABLET | Freq: Every day | ORAL | Status: DC
Start: 2024-05-19 — End: 2024-05-21
  Administered 2024-05-19 – 2024-05-20 (×2): 40 mg via ORAL
  Filled 2024-05-18 (×3): qty 1

## 2024-05-18 MED ORDER — SODIUM CHLORIDE 0.9 % IV BOLUS
1000.0000 mL | Freq: Once | INTRAVENOUS | Status: AC
  Administered 2024-05-18: 1000 mL via INTRAVENOUS

## 2024-05-18 MED ORDER — SODIUM CHLORIDE 0.9 % IV SOLN: INTRAVENOUS | Status: DC

## 2024-05-18 MED ORDER — ACETAMINOPHEN 650 MG RE SUPP: 650.0000 mg | Freq: Four times a day (QID) | RECTAL | Status: DC | PRN

## 2024-05-18 MED ORDER — ONDANSETRON HCL 4 MG/2ML IJ SOLN
4.0000 mg | Freq: Once | INTRAMUSCULAR | Status: AC
  Administered 2024-05-18: 4 mg via INTRAVENOUS
  Filled 2024-05-18: qty 2

## 2024-05-18 MED ORDER — ROPINIROLE HCL 1 MG PO TABS
1.0000 mg | ORAL_TABLET | Freq: Every day | ORAL | Status: DC
  Administered 2024-05-18 – 2024-05-19 (×3): 1 mg via ORAL
  Filled 2024-05-18 (×4): qty 1

## 2024-05-18 MED ORDER — OXYCODONE HCL 5 MG PO TABS
5.0000 mg | ORAL_TABLET | ORAL | Status: DC | PRN
  Administered 2024-05-18 – 2024-05-20 (×5): 5 mg via ORAL
  Filled 2024-05-18 (×5): qty 1

## 2024-05-18 MED ORDER — ACETAMINOPHEN 325 MG PO TABS
650.0000 mg | ORAL_TABLET | Freq: Four times a day (QID) | ORAL | Status: DC | PRN
  Administered 2024-05-19: 650 mg via ORAL
  Filled 2024-05-18: qty 2

## 2024-05-18 MED ORDER — UMECLIDINIUM BROMIDE 62.5 MCG/ACT IN AEPB
1.0000 | INHALATION_SPRAY | Freq: Every day | RESPIRATORY_TRACT | Status: DC
  Administered 2024-05-18 – 2024-05-20 (×3): 1 via RESPIRATORY_TRACT
  Filled 2024-05-18: qty 7

## 2024-05-18 MED ORDER — CYCLOBENZAPRINE HCL 10 MG PO TABS
10.0000 mg | ORAL_TABLET | Freq: Every day | ORAL | Status: DC
  Administered 2024-05-18 – 2024-05-19 (×2): 10 mg via ORAL
  Filled 2024-05-18 (×3): qty 1

## 2024-05-18 MED ORDER — PANTOPRAZOLE SODIUM 40 MG IV SOLR
80.0000 mg | Freq: Once | INTRAVENOUS | Status: AC
  Administered 2024-05-18: 80 mg via INTRAVENOUS
  Filled 2024-05-18: qty 20

## 2024-05-18 MED ORDER — SODIUM CHLORIDE 0.9% IV SOLUTION: Freq: Once | INTRAVENOUS | Status: DC

## 2024-05-18 MED ORDER — FENTANYL CITRATE (PF) 100 MCG/2ML IJ SOLN
12.5000 ug | INTRAMUSCULAR | Status: DC | PRN
  Administered 2024-05-19: 12.5 ug via INTRAVENOUS
  Administered 2024-05-20: 100 ug via INTRAVENOUS
  Administered 2024-05-20: 50 ug via INTRAVENOUS
  Administered 2024-05-20: 100 ug via INTRAVENOUS
  Filled 2024-05-18 (×2): qty 2

## 2024-05-18 MED ORDER — ATORVASTATIN CALCIUM 40 MG PO TABS: 40.0000 mg | ORAL_TABLET | Freq: Every day | ORAL | Status: DC

## 2024-05-18 MED ORDER — TERAZOSIN HCL 5 MG PO CAPS
10.0000 mg | ORAL_CAPSULE | Freq: Every day | ORAL | Status: DC
  Filled 2024-05-18: qty 2

## 2024-05-18 MED ORDER — PANTOPRAZOLE SODIUM 40 MG IV SOLR
40.0000 mg | Freq: Two times a day (BID) | INTRAVENOUS | Status: DC
  Administered 2024-05-18 – 2024-05-22 (×8): 40 mg via INTRAVENOUS
  Filled 2024-05-18 (×8): qty 10

## 2024-05-18 MED ORDER — IPRATROPIUM-ALBUTEROL 0.5-2.5 (3) MG/3ML IN SOLN: 3.0000 mL | RESPIRATORY_TRACT | Status: DC | PRN

## 2024-05-18 MED ORDER — SODIUM CHLORIDE 0.9% FLUSH
3.0000 mL | Freq: Two times a day (BID) | INTRAVENOUS | Status: DC
  Administered 2024-05-18 – 2024-05-26 (×21): 3 mL via INTRAVENOUS

## 2024-05-18 MED ORDER — CHLORHEXIDINE GLUCONATE CLOTH 2 % EX PADS
6.0000 | MEDICATED_PAD | Freq: Every day | CUTANEOUS | Status: DC
  Administered 2024-05-19 – 2024-05-20 (×3): 6 via TOPICAL

## 2024-05-18 MED ORDER — TRAZODONE HCL 50 MG PO TABS: 150.0000 mg | ORAL_TABLET | Freq: Every evening | ORAL | Status: DC | PRN

## 2024-05-18 NOTE — H&P (Signed)
 History and Physical    David Callahan FMW:[id number] DOB: Mar 31, 1956 DOA: 05/18/2024  PCP: [name], NP   Patient coming from: Home   Chief Complaint: hematemesis, black stool, DOE  HPI: David Callahan is a 68 y.o. male with medical history significant for COPD, hypertension, and peptic ulcer disease who presents with hematemesis, black stools, and exertional dyspnea.  Patient reports that he was feeling well during the day yesterday but developed nausea and mild epigastric pain last night and vomited blood at approximately 11 PM.  He notes that his stool has been black for weeks but attributes this to his iron  supplementation.  He reports complete avoidance of NSAIDs and strict adherence with twice-daily PPI.   ED Course: Upon arrival to the ED, patient is found to have normal heart rate and SBP in the 90s and greater.  Labs are most notable for BUN 52, creatinine 1.36, albumin 2.4, WBC 16,400, and hemoglobin 5.3.  ED physician sent a secure chat to GI (Dr. Eartha) and the patient was treated with 1 L NS, 80 mg IV Protonix , and Zofran .  2 units RBC were ordered for transfusion.  Review of Systems:  All other systems reviewed and apart from HPI, are negative.  Past Medical History:  Diagnosis Date   Asthma    COPD (chronic obstructive pulmonary disease) (HCC)    Heart murmur    Hypertension    Sleep apnea     Past Surgical History:  Procedure Laterality Date   COLONOSCOPY     ESOPHAGOGASTRODUODENOSCOPY N/A 03/10/2024   Procedure: EGD (ESOPHAGOGASTRODUODENOSCOPY);  Surgeon: Cinderella Deatrice FALCON, MD;  Location: [facility];  Service: Endoscopy;  Laterality: N/A;   ESOPHAGOGASTRODUODENOSCOPY N/A 04/06/2024   Procedure: EGD (ESOPHAGOGASTRODUODENOSCOPY);  Surgeon: Shaaron Lamar HERO, MD;  Location: [facility];  Service: Endoscopy;  Laterality: N/A;   JOINT REPLACEMENT Left    knee   KNEE SURGERY      Social History:   reports that he has been smoking cigarettes and  cigars. He has never used smokeless tobacco. He reports that he does not currently use alcohol. He reports that he does not use drugs.  Allergies  Allergen Reactions   Motrin [Ibuprofen] Anaphylaxis and Swelling    Anti-inflammatory analgesics- cause anaphylaxis    History reviewed. No pertinent family history.   Prior to Admission medications   Medication Sig Start Date End Date Taking? Authorizing Provider  acetaminophen  (TYLENOL ) 500 MG tablet Take 1,000 mg by mouth every 6 (six) hours as needed for mild pain (pain score 1-3).    [provider]  albuterol  (VENTOLIN  HFA) 108 (90 Base) MCG/ACT inhaler Inhale 2 puffs into the lungs every 6 (six) hours as needed for shortness of breath.    [provider]  atorvastatin  (LIPITOR) 40 MG tablet Take 40 mg by mouth daily.    [provider]  benazepril  (LOTENSIN ) 40 MG tablet Take 40 mg by mouth daily. 04/27/24   [provider]  cyclobenzaprine  (FLEXERIL ) 10 MG tablet Take 10 mg by mouth at bedtime. 09/17/20   [provider]  diltiazem  (CARDIZEM  CD) 360 MG 24 hr capsule Take 360 mg by mouth daily. 02/16/24   [provider]  docusate sodium  (COLACE) 100 MG capsule Take 1 capsule (100 mg total) by mouth 2 (two) times daily. 04/07/24 04/07/25  Maree, Pratik D, DO  esomeprazole  (NEXIUM ) 40 MG capsule Take 1 capsule (40 mg total) by mouth 2 (two) times daily before a meal. 05/08/24  05/08/25  Dena Charleston, MD  feeding supplement, ENSURE COMPLETE, (ENSURE COMPLETE) LIQD Take 237 mLs by mouth 2 (two) times daily between meals. 03/12/24   Evonnie Lenis, MD  ferrous sulfate  325 (65 FE) MG tablet Take 325 mg by mouth daily with breakfast.    [provider]  HYDROcodone -acetaminophen  (NORCO) 10-325 MG tablet Take 1 tablet by mouth every 4 (four) hours as needed for moderate pain (pain score 4-6). 02/10/24   [provider]  rOPINIRole  (REQUIP ) 1 MG tablet Take 1 mg by mouth daily. 02/16/24    [provider]  sucralfate  (CARAFATE ) 1 GM/10ML suspension Take 10 mLs (1 g total) by mouth 4 (four) times daily -  with meals and at bedtime for 28 days. 04/07/24 05/05/24  Maree, Pratik D, DO  terazosin  (HYTRIN ) 10 MG capsule Take 10 mg by mouth daily. 02/16/24   [provider]  traZODone  (DESYREL ) 150 MG tablet Take 150 mg by mouth at bedtime as needed for sleep. 02/16/24   [provider]  umeclidinium bromide  (INCRUSE ELLIPTA ) 62.5 MCG/ACT AEPB Inhale 1 puff into the lungs daily. 06/23/22   Bryn Bernardino NOVAK, MD    Physical Exam: Vitals:   05/18/24 0500 05/18/24 0505 05/18/24 0515 05/18/24 0550  BP: (!) 98/48  (!) 129/59   Pulse: 68 73 64 63  Resp: 15 16 14 15   SpO2: 100% 100% 100% 100%     Constitutional: NAD, pale, no diaphoresis  Eyes: PERTLA, lids and conjunctivae normal ENMT: Mucous membranes are moist. Posterior pharynx clear of any exudate or lesions.   Neck: supple, no masses  Respiratory: no wheezing, no crackles. No accessory muscle use.  Cardiovascular: S1 & S2 heard, regular rate and rhythm. No JVD. Abdomen: Soft, no guarding. Bowel sounds active.  Musculoskeletal: no clubbing / cyanosis. No joint deformity upper and lower extremities.   Skin: no significant rashes, lesions, ulcers. Warm, dry, pale. Neurologic: CN 2-12 grossly intact. Moving all extremities. Alert and oriented.  Psychiatric: Calm. Cooperative.    Labs and Imaging on Admission: I have personally reviewed following labs and imaging studies  CBC: Recent Labs  Lab 05/18/24 0447  WBC 16.4*  NEUTROABS PENDING  HGB 5.3*  HCT 16.8*  MCV 94.9  PLT 342   Basic Metabolic Panel: Recent Labs  Lab 05/18/24 0447  NA 142  K 5.1  CL 110  CO2 22  GLUCOSE 118*  BUN 52*  CREATININE 1.36*  CALCIUM  8.2*   GFR: Estimated Creatinine Clearance: 52.7 mL/min (A) (by C-G formula based on SCr of 1.36 mg/dL (H)). Liver Function Tests: Recent Labs  Lab 05/18/24 0447  AST 25  ALT 20   ALKPHOS 68  BILITOT 0.4  PROT 5.0*  ALBUMIN 2.4*   Recent Labs  Lab 05/18/24 0447  LIPASE 26   No results for input(s): AMMONIA in the last 168 hours. Coagulation Profile: No results for input(s): INR, PROTIME in the last 168 hours. Cardiac Enzymes: No results for input(s): CKTOTAL, CKMB, CKMBINDEX, TROPONINI in the last 168 hours. BNP (last 3 results) No results for input(s): PROBNP in the last 8760 hours. HbA1C: No results for input(s): HGBA1C in the last 72 hours. CBG: No results for input(s): GLUCAP in the last 168 hours. Lipid Profile: No results for input(s): CHOL, HDL, LDLCALC, TRIG, CHOLHDL, LDLDIRECT in the last 72 hours. Thyroid Function Tests: No results for input(s): TSH, T4TOTAL, FREET4, T3FREE, THYROIDAB in the last 72 hours. Anemia Panel: No results for input(s): VITAMINB12, FOLATE, FERRITIN, TIBC, IRON ,  RETICCTPCT in the last 72 hours. Urine analysis:    Component Value Date/Time   COLORURINE YELLOW 03/09/2024 1520   APPEARANCEUR CLEAR 03/09/2024 1520   LABSPEC 1.021 03/09/2024 1520   PHURINE 5.0 03/09/2024 1520   GLUCOSEU NEGATIVE 03/09/2024 1520   HGBUR NEGATIVE 03/09/2024 1520   BILIRUBINUR NEGATIVE 03/09/2024 1520   KETONESUR NEGATIVE 03/09/2024 1520   PROTEINUR 30 (A) 03/09/2024 1520   NITRITE NEGATIVE 03/09/2024 1520   LEUKOCYTESUR NEGATIVE 03/09/2024 1520   Sepsis Labs: @LABRCNTIP (procalcitonin:4,lacticidven:4) )No results found for this or any previous visit (from the past 240 hours).   Radiological Exams on Admission: No results found.   Assessment/Plan   1. Acute upper GI bleed; symptomatic anemia  - GI consulted by ED physician  - Continue bowel rest and IV PPI, proceed with transfusion of 2 units RBCs, follow serial CBCs    2. AKI  - Likely prerenal in setting of GIB with severe anemia  - Hold ACE-i, transfuse RBCs, continue IVF hydration, repeat chem panel in am    3.  Hypertension  - SBP 90s in ED, will hold antihypertensives and treat as-needed only for now   4. COPD  - Not in exacerbation  - Continue Incruse Ellipta  and as-needed short-acting bronchodilators     DVT prophylaxis: SCDs  Code Status: Full  Level of Care: Level of care: Stepdown Family Communication: None present   Disposition Plan:  Patient is from: home  Anticipated d/c is to: home  Anticipated d/c date is: 05/21/24  Patient currently: pending stable H&H  Consults called: GI  Admission status: Inpatient     Evalene GORMAN Sprinkles, MD [facility]  05/18/2024, 6:06 AM

## 2024-05-18 NOTE — Progress Notes (Signed)
 Date and time results received: 05/18/24 1423 (use smartphrase .now to insert current time)  Test: Hgb Critical Value: 6.2  Name of Provider Notified: Dr. Willette  Orders Received? Or Actions Taken?: MD aware. Awaiting orders. 2 units of blood have been completed.

## 2024-05-18 NOTE — Consult Note (Signed)
 Gastroenterology Consult   Referring Provider: No ref. provider found Primary Care Physician:  [name], NP Primary Gastroenterologist:  Dr. Eartha   Patient ID: David Callahan; [id number]; [birth year]   Admit date: 05/18/2024  LOS: 0 days   Date of Consultation: 05/18/2024  Reason for Consultation:  coffee ground emesis, dark stools, anemia   History of Present Illness   David Callahan is a 68 y.o. year old male with history of HTN, asthma, COPD, PUD with recent admission for large duodenal ulcer, likely NSAID induced who presented to the ED this morning with c/o hematemesis, black stools for the past few weeks, though on Oral iron . Denied NSAID use. 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  Patient reports onset of darker stools last week, prompting an ED visit due to concern for bleeding, however he was told his hgb was stable  at 9.8 and was felt dark stools were secondary to PO Iron . He was discharged the same day. He reports he has had more heartburn over the last week or so, PCP refilled carafate  which seemed to help. He denies NSAID use and reports compliance with his PCP. He had sudden onset of coffee ground emesis yesterday x3. Denies any BRB in emesis or per rectum. Stools have remained darker. Some abdominal discomfort but no real pain. Did have some diarrhea yesterday. He does not drink alcohol. Appetite stable.    Last EGD:03/2024 - Normal esophagus.                           - Normal stomach.                           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  Patient                            tells me he is absolutely refrain taking NSAIDs                            since his hospitalization; remain on twice daily                            PPI therapy. It is certainly conceivable that his                            ulcer has healed in this short period of time,                            taking away the insult and treated with twice daily                            PPI therapy. Today's findings are reassuring. He  did drop his hemoglobin back to 6.5 without overt                            bleeding; he could certainly may have had a NSAID                            insult further downstream. Recent colonoscopy                            report reassuring.  Last Colonoscopy: March 2025, [place], normal per patient    Past Medical History:  Diagnosis Date   Asthma    COPD (chronic obstructive pulmonary disease) (HCC)    Heart murmur    Hypertension    Sleep apnea     Past Surgical History:  Procedure Laterality Date   COLONOSCOPY     ESOPHAGOGASTRODUODENOSCOPY N/A 03/10/2024   Procedure: EGD (ESOPHAGOGASTRODUODENOSCOPY);  Surgeon: Cinderella Deatrice FALCON, MD;  Location: [facility];  Service: Endoscopy;  Laterality: N/A;   ESOPHAGOGASTRODUODENOSCOPY N/A 04/06/2024   Procedure: EGD (ESOPHAGOGASTRODUODENOSCOPY);  Surgeon: Shaaron Lamar HERO, MD;  Location: [facility];  Service: Endoscopy;  Laterality: N/A;   JOINT REPLACEMENT Left    knee   KNEE SURGERY      Prior to Admission medications   Medication Sig Start Date End Date Taking? Authorizing Provider  albuterol  (VENTOLIN  HFA) 108 (90 Base) MCG/ACT inhaler Inhale 2 puffs into the lungs every 6 (six) hours as needed for shortness of breath.   Yes [provider]  atorvastatin  (LIPITOR) 40 MG tablet Take 40 mg by mouth daily.   Yes [provider]  benazepril  (LOTENSIN ) 40 MG tablet Take 40 mg by mouth daily. 04/27/24  Yes [provider]  cyclobenzaprine  (FLEXERIL ) 10 MG tablet Take 10 mg by mouth at bedtime. 09/17/20  Yes [provider]  diltiazem  (CARDIZEM  CD) 360 MG 24 hr capsule Take 360 mg by mouth daily. 02/16/24  Yes [provider]  esomeprazole  (NEXIUM ) 40 MG capsule Take 1 capsule (40 mg total) by mouth 2 (two) times daily before a meal. 05/08/24 05/08/25 Yes Dorrell, Lamar, MD  feeding supplement, ENSURE COMPLETE, (ENSURE COMPLETE) LIQD Take 237 mLs by mouth 2 (two) times daily between meals. 03/12/24  Yes Tat, Alm, MD  ferrous sulfate  325 (65 FE) MG tablet Take 325 mg by mouth daily with breakfast.   Yes [provider]  HYDROcodone -acetaminophen  (NORCO) 10-325 MG tablet Take 1 tablet by mouth every 4 (four) hours as needed for moderate pain (pain score 4-6). 02/10/24  Yes [provider]  rOPINIRole  (REQUIP ) 1 MG tablet Take 1 mg by mouth daily. 02/16/24  Yes [provider]  sucralfate  (CARAFATE ) 1 GM/10ML suspension Take 10 mLs (1 g total) by mouth 4 (four) times daily -  with meals and at bedtime for 28 days. 04/07/24 05/18/24 Yes Shah, Pratik D, DO  terazosin  (HYTRIN ) 10 MG capsule Take 10 mg by mouth daily. 02/16/24  Yes [provider]  traZODone  (DESYREL ) 150 MG tablet Take 150 mg by mouth at bedtime as needed for sleep. 02/16/24  Yes [provider]  umeclidinium bromide  (INCRUSE ELLIPTA ) 62.5 MCG/ACT AEPB Inhale 1 puff into the lungs daily. 06/23/22  Yes Bryn Bernardino NOVAK, MD    Current Facility-Administered Medications  Medication Dose Route Frequency Provider Last Rate Last Admin   0.9 %  sodium chloride  infusion (Manually program via Guardrails IV Fluids)   Intravenous Once Opyd, Timothy S, MD       0.9 %  sodium chloride  infusion   Intravenous Continuous Opyd, Timothy S, MD       acetaminophen  (TYLENOL ) tablet 650 mg  650 mg Oral Q6H PRN Opyd, Timothy S, MD       Or   acetaminophen  (TYLENOL ) suppository 650 mg  650 mg Rectal Q6H PRN Opyd, Timothy S, MD        [START ON 05/19/2024] atorvastatin  (LIPITOR) tablet 40 mg  40 mg Oral Daily Shahmehdi, Seyed A, MD       Chlorhexidine  Gluconate Cloth 2 % PADS 6 each  6 each Topical Q0600 Opyd, Timothy S, MD       cyclobenzaprine  (FLEXERIL ) tablet 10 mg  10 mg Oral QHS Opyd, Timothy S, MD       fentaNYL  (SUBLIMAZE ) injection 12.5-50 mcg  12.5-50 mcg Intravenous Q2H PRN Opyd, Timothy S, MD       ipratropium-albuterol  (DUONEB) 0.5-2.5 (3) MG/3ML nebulizer solution 3 mL  3 mL Nebulization Q4H PRN Opyd, Timothy S, MD       oxyCODONE  (Oxy IR/ROXICODONE ) immediate release tablet 5 mg  5 mg Oral Q4H PRN Opyd, Timothy S, MD       pantoprazole  (PROTONIX ) injection 40 mg  40 mg Intravenous Q12H Opyd, Timothy S, MD       prochlorperazine  (COMPAZINE ) injection 5 mg  5 mg Intravenous Q6H PRN Opyd, Evalene RAMAN, MD       rOPINIRole  (REQUIP ) tablet 1 mg  1 mg Oral Daily Opyd, Timothy S, MD       sodium chloride  flush (NS) 0.9 % injection 3 mL  3 mL Intravenous Q12H Opyd, Timothy S, MD       traZODone  (DESYREL ) tablet 150 mg  150 mg Oral QHS PRN Opyd, Evalene RAMAN, MD       umeclidinium bromide  (INCRUSE ELLIPTA ) 62.5 MCG/ACT 1 puff  1 puff Inhalation Daily Opyd, Evalene RAMAN, MD   1 puff at 05/18/24 0816    Allergies as of 05/18/2024 - Review Complete 05/18/2024  Allergen Reaction Noted   Motrin [ibuprofen] Anaphylaxis and Swelling 10/07/2020    History reviewed. No pertinent family history.  Social History   Socioeconomic History   Marital status: Divorced    Spouse name: Not on file   Number of children: Not on file   Years of education: Not on file   Highest education level: Not on file  Occupational History   Not on file  Tobacco Use   Smoking status: Every Day    Current packs/day: 0.50    Types: Cigarettes, Cigars   Smokeless tobacco: Never  Vaping Use   Vaping status: Never Used  Substance and Sexual Activity   Alcohol use: Not Currently    Comment: ocassional homemade moonshine per pt   Drug use: Never    Sexual activity: Not on file  Other Topics Concern   Not on file  Social History Narrative   Not on file   Social Drivers of Health   Financial Resource Strain: Not on file  Food Insecurity: No Food Insecurity (05/08/2024)   Hunger Vital Sign    Worried About Running Out of Food in the Last Year: Never true    Ran Out of Food in the Last Year: Never true  Transportation Needs: No Transportation Needs (05/08/2024)   PRAPARE - Administrator, Civil Service (Medical): No  Lack of Transportation (Non-Medical): No  Physical Activity: Not on file  Stress: Not on file  Social Connections: Socially Isolated (05/08/2024)   Social Connection and Isolation Panel    Frequency of Communication with Friends and Family: More than three times a week    Frequency of Social Gatherings with Friends and Family: More than three times a week    Attends Religious Services: Never    Database administrator or Organizations: No    Attends Banker Meetings: Never    Marital Status: Divorced  Catering manager Violence: Not At Risk (05/08/2024)   Humiliation, Afraid, Rape, and Kick questionnaire    Fear of Current or Ex-Partner: No    Emotionally Abused: No    Physically Abused: No    Sexually Abused: No     Review of Systems   Gen: Denies any fever, chills, loss of appetite, change in weight or weight loss CV: Denies chest pain, heart palpitations, syncope, edema  Resp: Denies shortness of breath with rest, cough, wheezing, coughing up blood, and pleurisy. GI:+melena +vomiting +coffee ground emesis +diarrhea  GU : Denies urinary burning, blood in urine, urinary frequency, and urinary incontinence. MS: Denies joint pain, limitation of movement, swelling, cramps, and atrophy.  Derm: Denies rash, itching, dry skin, hives. Psych: Denies depression, anxiety, memory loss, hallucinations, and confusion. Heme: Denies bruising or bleeding Neuro:  Denies any headaches, dizziness,  paresthesias, shaking  Physical Exam   Vital Signs in last 24 hours: Temp:  [98 F (36.7 C)-98.6 F (37 C)] 98 F (36.7 C) (08/06 0900) Pulse Rate:  [59-84] 65 (08/06 0900) Resp:  [14-20] 18 (08/06 0900) BP: (98-154)/(44-63) 124/49 (08/06 0900) SpO2:  [94 %-100 %] 96 % (08/06 0900) Weight:  [79.4 kg] 79.4 kg (08/06 0751) Last BM Date : 05/17/24  General:   Alert,  Well-developed, well-nourished, pleasant and cooperative in NAD Head:  Normocephalic and atraumatic. Eyes:  Sclera clear, no icterus.   Conjunctiva pink. Ears:  Normal auditory acuity. Mouth:  No deformity or lesions, dentition normal. Neck:  Supple; no masses Lungs:  Clear throughout to auscultation.   No wheezes, crackles, or rhonchi. No acute distress. Heart:  Regular rate and rhythm; no murmurs, clicks, rubs,  or gallops. Abdomen:  Soft and nondistended. Mild TTP of upper to mid RIght abdomen. No masses, hepatosplenomegaly or hernias noted. Normal bowel sounds, without guarding, and without rebound.   Msk:  Symmetrical without gross deformities. Normal posture. Extremities:  Without clubbing or edema. Neurologic:  Alert and  oriented x4. Skin:  Intact without significant lesions or rashes. Psych:  Alert and cooperative. Normal mood and affect.  Labs/Studies   Recent Labs Recent Labs    05/18/24 0447  WBC 16.4*  HGB 5.3*  HCT 16.8*  PLT 342   BMET Recent Labs    05/18/24 0447  NA 142  K 5.1  CL 110  CO2 22  GLUCOSE 118*  BUN 52*  CREATININE 1.36*  CALCIUM  8.2*   LFT Recent Labs    05/18/24 0447  PROT 5.0*  ALBUMIN 2.4*  AST 25  ALT 20  ALKPHOS 68  BILITOT 0.4   PT/INR Recent Labs    05/18/24 0447  LABPROT 16.0*  INR 1.2     Assessment   David Callahan is a 68 y.o. year old male with history significant for PUD, recent admission with bleeding, large duodenal ulcer in June, who presented to the ED with dark stools, hematemesis, hgb 5.3 on admission,  GI consulted for further  evaluation.  Anemia, hematemesis in setting of PUD:  -history of large duodenal ulcer with visible vessel in may, though secondary to NSAID use which he reports complete cessation of.  -presented again with anemia and melena in June, subsequent EGD without findings to explain symptoms at that time -recent darker stools over the past week or so, though on PO iron  -hgb was  5.3 on admission, with iron  studies also low, B12 low as well -reports more heartburn over the past few weeks, improved with carafate , compliant with his PPI -coffee ground emesis yesterday x3, denies BRBPR or BRB In emesis -feeling weak, SOB, dizzy  Patient with history of NSAID abuse and duodenal ulcer though most recent endoscopic evaluation showed healing, though he continues to have intermittent melena, now with coffee ground emesis, and IDA, despite PO iron . No BRBPR or in emesis, therefore low suspicion for LGI source of blood loss. Could be dealing with a dieulafoy lesion. Given coffee ground emesis, darker stools, would recommend EGD tomorrow given PRBC transfusion is still underway, as long as hemoglobin improved. consider deployment of givens capsule during EGD if no findings on EGD to further evaluate remainder of small bowel for sources of GI blood loss.    Plan / Recommendations   -trend h&h, continue transfusion of PRBCs -monitor for overt GI bleeding -PPI BID -EGD tomorrow +/- Givens capsule deployment pending EGD findings  -remain NPO -anti emetics per hospitalist  -Avoid all NSAIDs -Carafate    05/18/2024, 9:20 AM  David Callahan L. Hannia Matchett, MSN, APRN, AGNP-C Adult-Gerontology Nurse Practitioner [facility]

## 2024-05-18 NOTE — Hospital Course (Addendum)
 David Callahan is a 68 y.o. male with medical history significant for COPD, hypertension, and peptic ulcer disease who presents with hematemesis, black stools, and exertional dyspnea.  Found to have a duodenal ulcer on EGD but this could not be stopped with bleeding so went to the OR emergently and underwent exploratory laparotomy to control his duodenal bleeding ulcer and had an omental patch over the do ectomy.  He is postop and had to be transferred to the unit and was intubated and subsequently extubated on 05/21/2024. Transferred to [facility] on 05/23/24.  Blood count is continued to drop slowly so he is already status post 13 units of PRBCs and only received one of the 2 units ordered yesterday.  White count continues to worsen but is now improving.  CT abdomen and pelvis was done and showed no evidence of infection.  Of note he has been very large black bowel movement today and case was discussed with GI and general surgery.  The surgery team feels that this is likely old blood and recommends that if the patient has any hemodynamic instability or any further need for transfusion that they recommend stat angiogram for stat angioembolization.  In the interim we will cycle his H&H is every 6.  Assessment and Plan:    Acute upper GI bleed and Blood Loss Anemia / Hemorrhagic Shock; Symptomatic Anemia status post exploratory laparotomy and control of his bleeding duodenal ulcer on 05/20/2024: GI consulted due to his peptic ulcer disease presented with hematemesis and black stools.  He had an EGD done which showed a large duodenal ulcer and subsequently distal not able to be stopped so he went to the OR and underwent exploratory laparotomy and controlling of his duodenal bleeding ulcer and had omental patch over his duodenectomy closure.  He went to the ICU postoperatively and has been transferred to [facility] service on 05/23/2024.  Blood count continues to be drop and current Trend: Recent Labs  Lab 05/22/24 1844  05/23/24 0040 05/23/24 1423 05/23/24 2133 05/24/24 0247 05/24/24 1146 05/24/24 1621  HGB 7.5* 7.0* 6.9* 7.4* 7.1* 7.5* 7.7*  HCT 23.5* 22.2* 21.9* 22.9* 22.3* 23.6* 24.3*  MCV 95.1  --  95.6 94.2 94.9 94.4  --   -Type and Screen and Transfuse 2 units of pRBCs (only 1 out of 2 units given on 8/11). He is already s/p 13 units  -Was  NPO and bowel rest. Has NG and surgery following his NG was removed and diet was advanced to full's.  Had a very large black bloody stool today and will monitor hemoglobin/hematocrit every 6 now.  Case was discussed with GI who is deferring to general surgery and they are recommending against EGD at this point.  General surgery feels that this is all probably old blood unless he has new hemodynamic instability.  Dr. Lyndel recommends that if the patient develops hemodynamic instability or need for transfusion would recommend a CT angiogram stat with eval for angio embolization  -Further care per surgery recommendations - PT OT recommending home health PT and OT when medically stable for discharge  Leukocytosis: Worsening. WBC Trend:  Recent Labs  Lab 05/22/24 0157 05/22/24 1048 05/22/24 1844 05/23/24 1423 05/23/24 2133 05/24/24 0247 05/24/24 1146  WBC 21.1* 21.7* 26.6* 30.5* 29.9* 25.5* 26.4*  -Continue monitoring and trend -CT scan of the abdomen pelvis done without contrast today and showed interval postsurgical changes of the ex lap with repair of the duodenal ulcer and omental patch without complication and infection.  Patient was noted to have new groundglass opacities in the right greater than left lobes concerning for low volume aspiration so we will obtain SLP evaluation.  His NG tube is now been removed.  See the official CT scan report for full findings -UA unremarkable except small hemoglobin and rare bacteria and blood cultures x 2 pending; chest x-ray showed No acute cardiopulmonary disease and that the Endotracheal tube and nasogastric tube  have been removed.  AKI / Metabolic Acidosis: Improved. BUN/Cr appears that it peaked at 53/1.94 and is now 24/1.12.  Metabolic acidosis is improved. Avoid Nephrotoxic Medications, Contrast Dyes, Hypotension and Dehydration to Ensure Adequate Renal Perfusion and will need to Renally Adjust Meds. CTM and Trend Renal Function carefully and repeat CMP in the AM   BPH: Terazosin  10 mg per Tube daily has been resumed  HLD: C/w Atorvastatin  40 mg per Tube Daily   Essential Hypertension: Was hypotensive on admission due to shock and Antihypertensives were held but blood pressures improved. Now Continuing Amlodipine  5 mg p.o. daily. C/w IV Hydralazine  10 mg q4hprn for SBP >180. CTM BP per Protocol. Last BP reading was 118/61   COPD: Not in exacerbation. On Incruse Ellipta  and as-needed short-acting bronchodilators. C/w Arformoterol  15 mcg neb BID and Revefenacin  175 mcg Neb Daily. C/w  DuoNeb 3 mL Neb q6hprn Wheezing. Repeat CXR in the AM  GERD/GI Prophylaxis: C/w PPI with pantoprazole  and changed to IV 40 mg every 12  Hypoalbuminemia: Patient's Albumin  Lvl went from 3.0 -> 2.4 -> 1.8 -> <1.5 -> 1.9. CTM and Trend and repeat CMP in the AM  Overweight: Complicates overall prognosis and care. Estimated body mass index is 25.05 kg/m as calculated from the following:   Height as of this encounter: 5' 10 (1.778 m).   Weight as of this encounter: 79.2 kg. Weight Loss and Dietary Counseling given

## 2024-05-18 NOTE — Progress Notes (Signed)
 PROGRESS NOTE    Patient: David Callahan                            PCP: [name], NP                    DOB: [birth year]            DOA: 05/18/2024 FMW:[id number]             DOS: 05/18/2024, 1:19 PM   LOS: 0 days   Date of Service: The patient was seen and examined on 05/18/2024  Subjective:   The patient was seen and examined this morning. Hemodynamically stable. No issues overnight .  Brief Narrative:   David Callahan is a 68 y.o. male with medical history significant for COPD, hypertension, and peptic ulcer disease who presents with hematemesis, black stools, and exertional dyspnea.   Patient reports that he was feeling well during the day yesterday but developed nausea and mild epigastric pain last night and vomited blood at approximately 11 PM.  He notes that his stool has been black for weeks but attributes this to his iron  supplementation.   He reports complete avoidance of NSAIDs and strict adherence with twice-daily PPI.    ED Course:  Normal heart rate and SBP in the 90s and greater.  Labs are most notable for BUN 52, creatinine 1.36, albumin 2.4, WBC 16,400, and hemoglobin 5.3.   ED physician sent a secure chat to GI (Dr. Eartha) and the patient was treated with 1 L NS, 80 mg IV Protonix , and Zofran .  2 units RBC were ordered for transfusion.       Assessment & Plan:   Principal Problem:   Acute upper GI bleed Active Problems:   COPD (chronic obstructive pulmonary disease) (HCC)   AKI (acute kidney injury) (HCC)   Essential hypertension   Assessment/Plan    Acute upper GI bleed; symptomatic anemia  - GI consulted-planning for EGD in a.m. also evaluating for capsule endoscopy - NPO - IVF _ IV Protonix  40 twice daily - Continue bowel rest and IV PPI, proceed with transfusion of 2 units RBCs, follow serial CBCs   -Monitoring H&H following, for any further blood transfusion     Latest Ref Rng & Units 05/18/2024   11:51 AM 05/18/2024    4:47 AM  05/08/2024    6:35 PM  CBC  WBC 4.0 - 10.5 K/uL 11.8  16.4  8.9   Hemoglobin 13.0 - 17.0 g/dL 6.2  5.3  9.8   Hematocrit 39.0 - 52.0 % 19.1  16.8  30.4   Platelets 150 - 400 K/uL 274  342  342       Leukocytosis  - Signs of infection likely reactive - Trend closely afebrile,   AKI  - Likely prerenal in setting of GIB with severe anemia  - Hold ACE-i, transfuse RBCs, continue IVF hydration, repeat chem panel in am   Lab Results  Component Value Date   CREATININE 1.36 (H) 05/18/2024   CREATININE 1.05 05/08/2024   CREATININE 1.09 04/07/2024        Hypertension -  POA hypotensive, SBP in 90s -Exacerbated by hypovolemia, -Holding BP meds     COPD  - Not in exacerbation  - Continue Incruse Ellipta  and as-needed short-acting bronchodilators       -------------------------------------------------------------------------------------------------------------------------------------------- Nutritional status:  The patient's BMI is: Body mass index is 25.12 kg/m. I agree  with the assessment and plan as outlined  Nutrition Status:         --------------------------------------------------------------------------------------------------------------------------------------------  DVT prophylaxis:  SCDs Start: 05/18/24 0605   Code Status:   Code Status: Full Code  Family Communication: No family member present at bedside-  -Advance care planning has been discussed.   Admission status:   Status is: Inpatient Remains inpatient appropriate because: Blood transfusion, GI evaluation for acute GI bleed   Disposition: From  - home             Planning for discharge in 1-2 days   Procedures:   No admission procedures for hospital encounter.   Antimicrobials:  Anti-infectives (From admission, onward)    None        Medication:   sodium chloride    Intravenous Once   [START ON 05/19/2024] atorvastatin   40 mg Oral Daily   Chlorhexidine  Gluconate Cloth  6 each  Topical Q0600   cyclobenzaprine   10 mg Oral QHS   pantoprazole  (PROTONIX ) IV  40 mg Intravenous Q12H   rOPINIRole   1 mg Oral Daily   sodium chloride  flush  3 mL Intravenous Q12H   umeclidinium bromide   1 puff Inhalation Daily    acetaminophen  **OR** acetaminophen , fentaNYL  (SUBLIMAZE ) injection, ipratropium-albuterol , oxyCODONE , prochlorperazine , traZODone    Objective:   Vitals:   05/18/24 1121 05/18/24 1155 05/18/24 1200 05/18/24 1300  BP:  (!) 161/68 (!) 121/52   Pulse: 65 60 62   Resp: 20 15 13 18   Temp: 98.6 F (37 C) 98.3 F (36.8 C)    TempSrc:  Oral    SpO2: 97%  97%   Weight:      Height:        Intake/Output Summary (Last 24 hours) at 05/18/2024 1319 Last data filed at 05/18/2024 1300 Gross per 24 hour  Intake 702.67 ml  Output 650 ml  Net 52.67 ml   Filed Weights   05/18/24 0751  Weight: 79.4 kg     Physical examination:   General:  AAO x 3,  cooperative, no distress;   HEENT:  Normocephalic, PERRL, otherwise with in Normal limits   Neuro:  CNII-XII intact. , normal motor and sensation, reflexes intact   Lungs:   Clear to auscultation BL, Respirations unlabored,  No wheezes / crackles  Cardio:    S1/S2, RRR, No murmure, No Rubs or Gallops   Abdomen:  Soft, non-tender, bowel sounds active all four quadrants, no guarding or peritoneal signs.  Muscular  skeletal:  Limited exam -global generalized weaknesses - in bed, able to move all 4 extremities,   2+ pulses,  symmetric, No pitting edema  Skin:  Dry, warm to touch, negative for any Rashes,  Wounds: Please see nursing documentation       ------------------------------------------------------------------------------------------------------------------------------------------    LABs:     Latest Ref Rng & Units 05/18/2024   11:51 AM 05/18/2024    4:47 AM 05/08/2024    6:35 PM  CBC  WBC 4.0 - 10.5 K/uL 11.8  16.4  8.9   Hemoglobin 13.0 - 17.0 g/dL 6.2  5.3  9.8   Hematocrit 39.0 - 52.0 % 19.1  16.8   30.4   Platelets 150 - 400 K/uL 274  342  342       Latest Ref Rng & Units 05/18/2024    4:47 AM 05/08/2024    6:35 PM 04/07/2024    3:58 AM  CMP  Glucose 70 - 99 mg/dL 881  99  77   BUN 8 -  23 mg/dL 52  19  15   Creatinine 0.61 - 1.24 mg/dL 8.63  8.94  8.90   Sodium 135 - 145 mmol/L 142  140  138   Potassium 3.5 - 5.1 mmol/L 5.1  3.4  3.4   Chloride 98 - 111 mmol/L 110  107  101   CO2 22 - 32 mmol/L 22  24  27    Calcium  8.9 - 10.3 mg/dL 8.2  8.4  8.6   Total Protein 6.5 - 8.1 g/dL 5.0  5.8    Total Bilirubin 0.0 - 1.2 mg/dL 0.4  0.7    Alkaline Phos 38 - 126 U/L 68  81    AST 15 - 41 U/L 25  27    ALT 0 - 44 U/L 20  26         Micro Results Recent Results (from the past 240 hours)  MRSA Next Gen by PCR, Nasal     Status: None   Collection Time: 05/18/24  7:50 AM   Specimen: Nasal Mucosa; Nasal Swab  Result Value Ref Range Status   MRSA by PCR Next Gen NOT DETECTED NOT DETECTED Final    Comment: (NOTE) The GeneXpert MRSA Assay (FDA approved for NASAL specimens only), is one component of a comprehensive MRSA colonization surveillance program. It is not intended to diagnose MRSA infection nor to guide or monitor treatment for MRSA infections. Test performance is not FDA approved in patients less than 16 years old. Performed at [facility], [address], [place], [place] [address]     Radiology Reports No results found.  SIGNED: Adriana DELENA Grams, MD, FHM. FAAFP. Jolynn Pack - [facility] Attending to monitor and ICU setting for acute symptomatic anemia, with hypotension, and close monitoring  Critical care time spent - 55 min.  In seeing, evaluating and examining the patient. Reviewing medical records, labs, drawn plan of care. [facility],  Pager (please use [email or web address] to page/ text) Please use Epic Secure Chat for non-urgent communication (7AM-7PM)  If 7PM-7AM, please contact night-coverage [email or web address], 05/18/2024, 1:19 PM

## 2024-05-18 NOTE — ED Provider Notes (Signed)
 [place] EMERGENCY DEPARTMENT AT [facility] Provider Note   CSN: [id number] Arrival date & time: 05/18/24  9652     Patient presents with: Hematemesis   David Callahan is a 68 y.o. male.   The history is provided by the patient.   He has a history of hypertension, asthma, COPD, peptic ulcer disease with bleeding comes in because of vomiting blood and black stools.  He states that he vomited blood tonight.  His stools have been black for several weeks but that has been blamed on his taking iron .  He complains of mild epigastric pain.  He denies any radiation of pain to the back.  He states he has been compliant with taking esomeprazole  and denies any NSAID use.    Prior to Admission medications   Medication Sig Start Date End Date Taking? Authorizing Provider  acetaminophen  (TYLENOL ) 500 MG tablet Take 1,000 mg by mouth every 6 (six) hours as needed for mild pain (pain score 1-3).    [provider]  albuterol  (VENTOLIN  HFA) 108 (90 Base) MCG/ACT inhaler Inhale 2 puffs into the lungs every 6 (six) hours as needed for shortness of breath.    [provider]  atorvastatin  (LIPITOR) 40 MG tablet Take 40 mg by mouth daily.    [provider]  benazepril  (LOTENSIN ) 40 MG tablet Take 40 mg by mouth daily. 04/27/24   [provider]  cyclobenzaprine  (FLEXERIL ) 10 MG tablet Take 10 mg by mouth at bedtime. 09/17/20   [provider]  diltiazem  (CARDIZEM  CD) 360 MG 24 hr capsule Take 360 mg by mouth daily. 02/16/24   [provider]  docusate sodium  (COLACE) 100 MG capsule Take 1 capsule (100 mg total) by mouth 2 (two) times daily. 04/07/24 04/07/25  Maree, Pratik D, DO  esomeprazole  (NEXIUM ) 40 MG capsule Take 1 capsule (40 mg total) by mouth 2 (two) times daily before a meal. 05/08/24 05/08/25  Dena Charleston, MD  feeding supplement, ENSURE COMPLETE, (ENSURE COMPLETE) LIQD Take 237 mLs by mouth 2 (two) times daily between meals. 03/12/24    Evonnie Lenis, MD  ferrous sulfate  325 (65 FE) MG tablet Take 325 mg by mouth daily with breakfast.    [provider]  HYDROcodone -acetaminophen  (NORCO) 10-325 MG tablet Take 1 tablet by mouth every 4 (four) hours as needed for moderate pain (pain score 4-6). 02/10/24   [provider]  rOPINIRole  (REQUIP ) 1 MG tablet Take 1 mg by mouth daily. 02/16/24   [provider]  sucralfate  (CARAFATE ) 1 GM/10ML suspension Take 10 mLs (1 g total) by mouth 4 (four) times daily -  with meals and at bedtime for 28 days. 04/07/24 05/05/24  Maree, Pratik D, DO  terazosin  (HYTRIN ) 10 MG capsule Take 10 mg by mouth daily. 02/16/24   [provider]  traZODone  (DESYREL ) 150 MG tablet Take 150 mg by mouth at bedtime as needed for sleep. 02/16/24   [provider]  umeclidinium bromide  (INCRUSE ELLIPTA ) 62.5 MCG/ACT AEPB Inhale 1 puff into the lungs daily. 06/23/22   Bryn Bernardino NOVAK, MD    Allergies: Motrin [ibuprofen]    Review of Systems  All other systems reviewed and are negative.   Updated Vital Signs There were no vitals taken for this visit.  Physical Exam Vitals and nursing note reviewed.  Musculoskeletal:        General: Deformity: 59.   68 year old male, resting comfortably and in no acute distress. Vital signs are normal.  Oxygen  saturation is 100%, which is normal. Head is normocephalic and atraumatic. PERRLA, EOMI.  Neck is nontender and supple without adenopathy or JVD. Lungs are clear without rales, wheezes, or rhonchi. Heart has regular rate and rhythm without murmur. Abdomen is soft, flat, with mild epigastric tenderness. Rectal: Normal sphincter tone, stool is greenish-black and Hemoccult positive. Extremities have no cyanosis or edema. Skin is warm and dry without rash. Neurologic: Mental status is normal, cranial nerves are intact, moves all extremities equally.  (all labs ordered are listed, but only abnormal results are displayed) Labs Reviewed   COMPREHENSIVE METABOLIC PANEL WITH GFR - Abnormal; Notable for the following components:      Result Value   Glucose, Bld 118 (*)    BUN 52 (*)    Creatinine, Ser 1.36 (*)    Calcium  8.2 (*)    Total Protein 5.0 (*)    Albumin  2.4 (*)    GFR, Estimated 57 (*)    All other components within normal limits  CBC WITH DIFFERENTIAL/PLATELET - Abnormal; Notable for the following components:   WBC 16.4 (*)    RBC 1.77 (*)    Hemoglobin 5.3 (*)    HCT 16.8 (*)    RDW 21.4 (*)    All other components within normal limits  LIPASE, BLOOD  PROTIME-INR  TYPE AND SCREEN  PREPARE RBC (CROSSMATCH)     Procedures  Cardiac monitor shows normal sinus rhythm, per my interpretation. Medications Ordered in the ED  0.9 %  sodium chloride  infusion (Manually program via Guardrails IV Fluids) (has no administration in time range)  sodium chloride  0.9 % bolus 1,000 mL (1,000 mLs Intravenous Bolus 05/18/24 0427)  pantoprazole  (PROTONIX ) injection 80 mg (80 mg Intravenous Given 05/18/24 0427)  ondansetron  (ZOFRAN ) injection 4 mg (4 mg Intravenous Given 05/18/24 0427)                                    Medical Decision Making Amount and/or Complexity of Data Reviewed Labs: ordered.  Risk Prescription drug management. Decision regarding hospitalization.   Upper gastrointestinal bleeding in the patient with known history of bleeding ulcers.  Differential diagnosis does also include Mallory-Weiss tear, gastritis, AV malformation but these are all much less likely.  He is currently hemodynamically stable.  I have ordered IV fluids, screening labs including type and screen, ondansetron  for nausea and IV pantoprazole .  I have reviewed his past records, and note 3 recent hospitalizations for GI bleeding, most recently on 05/08/2024.  I have reviewed his laboratory tests, and my interpretation is severe anemia with hemoglobin dropped 4.5 g compared with 05/08/2024, moderate leukocytosis felt to be stress related,  acute kidney injury with BUN increased significantly more than creatinine felt to be secondary to GI bleed, elevated random glucose which will need to be followed as an outpatient.  I have ordered blood for transfusion.  I have discussed case with Dr. Charlton of [facility], who agrees to admit the patient.  I have sent a secure chat to Dr. Eartha who is on-call for gastroenterology to notify him of the need to see the patient in consultation.  CRITICAL CARE Performed by: Alm Lias Total critical care time: 60 minutes Critical care time was exclusive of separately billable procedures and treating other patients. Critical care was necessary to treat or prevent imminent or life-threatening deterioration. Critical care was time spent personally by me on the following activities:  development of treatment plan with patient and/or surrogate as well as nursing, discussions with consultants, evaluation of patient's response to treatment, examination of patient, obtaining history from patient or surrogate, ordering and performing treatments and interventions, ordering and review of laboratory studies, ordering and review of radiographic studies, pulse oximetry and re-evaluation of patient's condition.     Final diagnoses:  UGI bleed  Acute kidney injury (nontraumatic) (HCC)  Elevated random blood glucose level    ED Discharge Orders     None          Raford Lenis, MD 05/18/24 [phone]

## 2024-05-18 NOTE — ED Notes (Signed)
Consent for blood signed  

## 2024-05-18 NOTE — H&P (View-Only) (Signed)
 Gastroenterology Consult   Referring Provider: No ref. provider found Primary Care Physician:  [name], NP Primary Gastroenterologist:  Dr. Eartha   Patient ID: David Callahan; [id number]; [birth year]   Admit date: 05/18/2024  LOS: 0 days   Date of Consultation: 05/18/2024  Reason for Consultation:  coffee ground emesis, dark stools, anemia   History of Present Illness   David Callahan is a 68 y.o. year old male with history of HTN, asthma, COPD, PUD with recent admission for large duodenal ulcer, likely NSAID induced who presented to the ED this morning with c/o hematemesis, black stools for the past few weeks, though on Oral iron . Denied NSAID use. 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  Patient reports onset of darker stools last week, prompting an ED visit due to concern for bleeding, however he was told his hgb was stable  at 9.8 and was felt dark stools were secondary to PO Iron . He was discharged the same day. He reports he has had more heartburn over the last week or so, PCP refilled carafate  which seemed to help. He denies NSAID use and reports compliance with his PCP. He had sudden onset of coffee ground emesis yesterday x3. Denies any BRB in emesis or per rectum. Stools have remained darker. Some abdominal discomfort but no real pain. Did have some diarrhea yesterday. He does not drink alcohol. Appetite stable.    Last EGD:03/2024 - Normal esophagus.                           - Normal stomach.                           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  Patient                            tells me he is absolutely refrain taking NSAIDs                            since his hospitalization; remain on twice daily                            PPI therapy. It is certainly conceivable that his                            ulcer has healed in this short period of time,                            taking away the insult and treated with twice daily                            PPI therapy. Today's findings are reassuring. He  did drop his hemoglobin back to 6.5 without overt                            bleeding; he could certainly may have had a NSAID                            insult further downstream. Recent colonoscopy                            report reassuring.  Last Colonoscopy: March 2025, [place], normal per patient    Past Medical History:  Diagnosis Date   Asthma    COPD (chronic obstructive pulmonary disease) (HCC)    Heart murmur    Hypertension    Sleep apnea     Past Surgical History:  Procedure Laterality Date   COLONOSCOPY     ESOPHAGOGASTRODUODENOSCOPY N/A 03/10/2024   Procedure: EGD (ESOPHAGOGASTRODUODENOSCOPY);  Surgeon: Cinderella Deatrice FALCON, MD;  Location: [facility];  Service: Endoscopy;  Laterality: N/A;   ESOPHAGOGASTRODUODENOSCOPY N/A 04/06/2024   Procedure: EGD (ESOPHAGOGASTRODUODENOSCOPY);  Surgeon: Shaaron Lamar HERO, MD;  Location: [facility];  Service: Endoscopy;  Laterality: N/A;   JOINT REPLACEMENT Left    knee   KNEE SURGERY      Prior to Admission medications   Medication Sig Start Date End Date Taking? Authorizing Provider  albuterol  (VENTOLIN  HFA) 108 (90 Base) MCG/ACT inhaler Inhale 2 puffs into the lungs every 6 (six) hours as needed for shortness of breath.   Yes [provider]  atorvastatin  (LIPITOR) 40 MG tablet Take 40 mg by mouth daily.   Yes [provider]  benazepril  (LOTENSIN ) 40 MG tablet Take 40 mg by mouth daily. 04/27/24  Yes [provider]  cyclobenzaprine  (FLEXERIL ) 10 MG tablet Take 10 mg by mouth at bedtime. 09/17/20  Yes [provider]  diltiazem  (CARDIZEM  CD) 360 MG 24 hr capsule Take 360 mg by mouth daily. 02/16/24  Yes [provider]  esomeprazole  (NEXIUM ) 40 MG capsule Take 1 capsule (40 mg total) by mouth 2 (two) times daily before a meal. 05/08/24 05/08/25 Yes Dorrell, Lamar, MD  feeding supplement, ENSURE COMPLETE, (ENSURE COMPLETE) LIQD Take 237 mLs by mouth 2 (two) times daily between meals. 03/12/24  Yes Tat, Alm, MD  ferrous sulfate  325 (65 FE) MG tablet Take 325 mg by mouth daily with breakfast.   Yes [provider]  HYDROcodone -acetaminophen  (NORCO) 10-325 MG tablet Take 1 tablet by mouth every 4 (four) hours as needed for moderate pain (pain score 4-6). 02/10/24  Yes [provider]  rOPINIRole  (REQUIP ) 1 MG tablet Take 1 mg by mouth daily. 02/16/24  Yes [provider]  sucralfate  (CARAFATE ) 1 GM/10ML suspension Take 10 mLs (1 g total) by mouth 4 (four) times daily -  with meals and at bedtime for 28 days. 04/07/24 05/18/24 Yes Shah, Pratik D, DO  terazosin  (HYTRIN ) 10 MG capsule Take 10 mg by mouth daily. 02/16/24  Yes [provider]  traZODone  (DESYREL ) 150 MG tablet Take 150 mg by mouth at bedtime as needed for sleep. 02/16/24  Yes [provider]  umeclidinium bromide  (INCRUSE ELLIPTA ) 62.5 MCG/ACT AEPB Inhale 1 puff into the lungs daily. 06/23/22  Yes Bryn Bernardino NOVAK, MD    Current Facility-Administered Medications  Medication Dose Route Frequency Provider Last Rate Last Admin   0.9 %  sodium chloride  infusion (Manually program via Guardrails IV Fluids)   Intravenous Once Opyd, Timothy S, MD       0.9 %  sodium chloride  infusion   Intravenous Continuous Opyd, Timothy S, MD       acetaminophen  (TYLENOL ) tablet 650 mg  650 mg Oral Q6H PRN Opyd, Timothy S, MD       Or   acetaminophen  (TYLENOL ) suppository 650 mg  650 mg Rectal Q6H PRN Opyd, Timothy S, MD        [START ON 05/19/2024] atorvastatin  (LIPITOR) tablet 40 mg  40 mg Oral Daily Shahmehdi, Seyed A, MD       Chlorhexidine  Gluconate Cloth 2 % PADS 6 each  6 each Topical Q0600 Opyd, Timothy S, MD       cyclobenzaprine  (FLEXERIL ) tablet 10 mg  10 mg Oral QHS Opyd, Timothy S, MD       fentaNYL  (SUBLIMAZE ) injection 12.5-50 mcg  12.5-50 mcg Intravenous Q2H PRN Opyd, Timothy S, MD       ipratropium-albuterol  (DUONEB) 0.5-2.5 (3) MG/3ML nebulizer solution 3 mL  3 mL Nebulization Q4H PRN Opyd, Timothy S, MD       oxyCODONE  (Oxy IR/ROXICODONE ) immediate release tablet 5 mg  5 mg Oral Q4H PRN Opyd, Timothy S, MD       pantoprazole  (PROTONIX ) injection 40 mg  40 mg Intravenous Q12H Opyd, Timothy S, MD       prochlorperazine  (COMPAZINE ) injection 5 mg  5 mg Intravenous Q6H PRN Opyd, Evalene RAMAN, MD       rOPINIRole  (REQUIP ) tablet 1 mg  1 mg Oral Daily Opyd, Timothy S, MD       sodium chloride  flush (NS) 0.9 % injection 3 mL  3 mL Intravenous Q12H Opyd, Timothy S, MD       traZODone  (DESYREL ) tablet 150 mg  150 mg Oral QHS PRN Opyd, Evalene RAMAN, MD       umeclidinium bromide  (INCRUSE ELLIPTA ) 62.5 MCG/ACT 1 puff  1 puff Inhalation Daily Opyd, Evalene RAMAN, MD   1 puff at 05/18/24 0816    Allergies as of 05/18/2024 - Review Complete 05/18/2024  Allergen Reaction Noted   Motrin [ibuprofen] Anaphylaxis and Swelling 10/07/2020    History reviewed. No pertinent family history.  Social History   Socioeconomic History   Marital status: Divorced    Spouse name: Not on file   Number of children: Not on file   Years of education: Not on file   Highest education level: Not on file  Occupational History   Not on file  Tobacco Use   Smoking status: Every Day    Current packs/day: 0.50    Types: Cigarettes, Cigars   Smokeless tobacco: Never  Vaping Use   Vaping status: Never Used  Substance and Sexual Activity   Alcohol use: Not Currently    Comment: ocassional homemade moonshine per pt   Drug use: Never    Sexual activity: Not on file  Other Topics Concern   Not on file  Social History Narrative   Not on file   Social Drivers of Health   Financial Resource Strain: Not on file  Food Insecurity: No Food Insecurity (05/08/2024)   Hunger Vital Sign    Worried About Running Out of Food in the Last Year: Never true    Ran Out of Food in the Last Year: Never true  Transportation Needs: No Transportation Needs (05/08/2024)   PRAPARE - Administrator, Civil Service (Medical): No  Lack of Transportation (Non-Medical): No  Physical Activity: Not on file  Stress: Not on file  Social Connections: Socially Isolated (05/08/2024)   Social Connection and Isolation Panel    Frequency of Communication with Friends and Family: More than three times a week    Frequency of Social Gatherings with Friends and Family: More than three times a week    Attends Religious Services: Never    Database administrator or Organizations: No    Attends Banker Meetings: Never    Marital Status: Divorced  Catering manager Violence: Not At Risk (05/08/2024)   Humiliation, Afraid, Rape, and Kick questionnaire    Fear of Current or Ex-Partner: No    Emotionally Abused: No    Physically Abused: No    Sexually Abused: No     Review of Systems   Gen: Denies any fever, chills, loss of appetite, change in weight or weight loss CV: Denies chest pain, heart palpitations, syncope, edema  Resp: Denies shortness of breath with rest, cough, wheezing, coughing up blood, and pleurisy. GI:+melena +vomiting +coffee ground emesis +diarrhea  GU : Denies urinary burning, blood in urine, urinary frequency, and urinary incontinence. MS: Denies joint pain, limitation of movement, swelling, cramps, and atrophy.  Derm: Denies rash, itching, dry skin, hives. Psych: Denies depression, anxiety, memory loss, hallucinations, and confusion. Heme: Denies bruising or bleeding Neuro:  Denies any headaches, dizziness,  paresthesias, shaking  Physical Exam   Vital Signs in last 24 hours: Temp:  [98 F (36.7 C)-98.6 F (37 C)] 98 F (36.7 C) (08/06 0900) Pulse Rate:  [59-84] 65 (08/06 0900) Resp:  [14-20] 18 (08/06 0900) BP: (98-154)/(44-63) 124/49 (08/06 0900) SpO2:  [94 %-100 %] 96 % (08/06 0900) Weight:  [79.4 kg] 79.4 kg (08/06 0751) Last BM Date : 05/17/24  General:   Alert,  Well-developed, well-nourished, pleasant and cooperative in NAD Head:  Normocephalic and atraumatic. Eyes:  Sclera clear, no icterus.   Conjunctiva pink. Ears:  Normal auditory acuity. Mouth:  No deformity or lesions, dentition normal. Neck:  Supple; no masses Lungs:  Clear throughout to auscultation.   No wheezes, crackles, or rhonchi. No acute distress. Heart:  Regular rate and rhythm; no murmurs, clicks, rubs,  or gallops. Abdomen:  Soft and nondistended. Mild TTP of upper to mid RIght abdomen. No masses, hepatosplenomegaly or hernias noted. Normal bowel sounds, without guarding, and without rebound.   Msk:  Symmetrical without gross deformities. Normal posture. Extremities:  Without clubbing or edema. Neurologic:  Alert and  oriented x4. Skin:  Intact without significant lesions or rashes. Psych:  Alert and cooperative. Normal mood and affect.  Labs/Studies   Recent Labs Recent Labs    05/18/24 0447  WBC 16.4*  HGB 5.3*  HCT 16.8*  PLT 342   BMET Recent Labs    05/18/24 0447  NA 142  K 5.1  CL 110  CO2 22  GLUCOSE 118*  BUN 52*  CREATININE 1.36*  CALCIUM  8.2*   LFT Recent Labs    05/18/24 0447  PROT 5.0*  ALBUMIN 2.4*  AST 25  ALT 20  ALKPHOS 68  BILITOT 0.4   PT/INR Recent Labs    05/18/24 0447  LABPROT 16.0*  INR 1.2     Assessment   David Callahan is a 68 y.o. year old male with history significant for PUD, recent admission with bleeding, large duodenal ulcer in June, who presented to the ED with dark stools, hematemesis, hgb 5.3 on admission,  GI consulted for further  evaluation.  Anemia, hematemesis in setting of PUD:  -history of large duodenal ulcer with visible vessel in may, though secondary to NSAID use which he reports complete cessation of.  -presented again with anemia and melena in June, subsequent EGD without findings to explain symptoms at that time -recent darker stools over the past week or so, though on PO iron  -hgb was  5.3 on admission, with iron  studies also low, B12 low as well -reports more heartburn over the past few weeks, improved with carafate , compliant with his PPI -coffee ground emesis yesterday x3, denies BRBPR or BRB In emesis -feeling weak, SOB, dizzy  Patient with history of NSAID abuse and duodenal ulcer though most recent endoscopic evaluation showed healing, though he continues to have intermittent melena, now with coffee ground emesis, and IDA, despite PO iron . No BRBPR or in emesis, therefore low suspicion for LGI source of blood loss. Could be dealing with a dieulafoy lesion. Given coffee ground emesis, darker stools, would recommend EGD tomorrow given PRBC transfusion is still underway, as long as hemoglobin improved. consider deployment of givens capsule during EGD if no findings on EGD to further evaluate remainder of small bowel for sources of GI blood loss.    Plan / Recommendations   -trend h&h, continue transfusion of PRBCs -monitor for overt GI bleeding -PPI BID -EGD tomorrow +/- Givens capsule deployment pending EGD findings  -remain NPO -anti emetics per hospitalist  -Avoid all NSAIDs -Carafate    05/18/2024, 9:20 AM  Kayleann Mccaffery L. Hannia Matchett, MSN, APRN, AGNP-C Adult-Gerontology Nurse Practitioner [facility] at [facility]

## 2024-05-18 NOTE — Plan of Care (Signed)

## 2024-05-18 NOTE — ED Triage Notes (Signed)
 Pt bib [facility] from home c/o hematemesis and bloody stools that started last night. Hx of gastric ulcers. Per EMS pt became SOB upon exertion. Pt arrives on nonrebreather, does not wear oxygen  at home, hx of COPD

## 2024-05-18 NOTE — ED Notes (Signed)
 Pt is currently on RA at this time with an SpO2 of 95%

## 2024-05-18 NOTE — TOC Initial Note (Signed)
 Transition of Care [facility]) - Initial/Assessment Note    Patient Details  Name: David Callahan MRN: [id number] Date of Birth: Oct 11, 1956  Transition of [facility]) CM/SW Contact:    Lucie Lunger, LCSWA Phone Number: 05/18/2024, 9:24 AM  Clinical Narrative:                 Pt is high risk for readmission. Pt is known to [facility] from previous hospital admissions. Pt lives alone and is independent with ADLs. Pt ambulates with a cane. Pt drives himself to appointments. TOC to follow.   Expected Discharge Plan: Home/Self Care Barriers to Discharge: Continued Medical Work up   Patient Goals and CMS Choice Patient states their goals for this hospitalization and ongoing recovery are:: return home CMS Medicare.gov Compare Post Acute Care list provided to:: Patient Choice offered to / list presented to : Patient      Expected Discharge Plan and Services In-house Referral: Clinical Social Work Discharge Planning Services: CM Consult   Living arrangements for the past 2 months: Single Family Home                                      Prior Living Arrangements/Services Living arrangements for the past 2 months: Single Family Home Lives with:: Self Patient language and need for interpreter reviewed:: Yes Do you feel safe going back to the place where you live?: Yes      Need for Family Participation in Patient Care: Yes (Comment) Care giver support system in place?: Yes (comment) Current home services: DME (cane and walker) Criminal Activity/Legal Involvement Pertinent to Current Situation/Hospitalization: No - Comment as needed  Activities of Daily Living      Permission Sought/Granted                  Emotional Assessment Appearance:: Appears stated age Attitude/Demeanor/Rapport: Engaged Affect (typically observed): Accepting Orientation: : Oriented to Self, Oriented to Place, Oriented to  Time, Oriented to Situation Alcohol / Substance Use: Not Applicable Psych  Involvement: No (comment)  Admission diagnosis:  UGI bleed [K92.2] Acute kidney injury (nontraumatic) (HCC) [N17.9] Acute upper GI bleed [K92.2] Elevated random blood glucose level [R73.9] Patient Active Problem List   Diagnosis Date Noted   Acute upper GI bleed 05/18/2024   Essential hypertension 04/04/2024   Duodenal ulcer 03/11/2024   Peptic ulcer hemorrhagic 03/10/2024   Symptomatic anemia 03/10/2024   Acute blood loss anemia 03/09/2024   Melena 03/09/2024   Pleural effusion 06/14/2022   Thrombocytosis 06/05/2022   AKI (acute kidney injury) (HCC) 06/05/2022   Hyperglycemia 06/05/2022   Hyponatremia 06/05/2022   Hypokalemia 06/05/2022   Restless leg syndrome 06/05/2022   Mixed hyperlipidemia 06/05/2022   CAP (community acquired pneumonia) 06/04/2022   Leukocytosis 03/25/2022   COPD with acute bronchitis (HCC) 10/07/2020   COPD (chronic obstructive pulmonary disease) (HCC) 10/07/2020   Bronchitis    Acute respiratory failure with hypoxia (HCC)    PCP:  [name], NP Pharmacy:   [facility] - [place], [place] - [address] [place] [address] Phone: [phone] Fax: [phone]     Social Drivers of Health (SDOH) Social History: SDOH Screenings   Food Insecurity: No Food Insecurity (05/08/2024)  Housing: Low Risk  (05/08/2024)  Transportation Needs: No Transportation Needs (05/08/2024)  Utilities: Not At Risk (05/08/2024)  Social Connections: Socially Isolated (05/08/2024)  Tobacco Use: High Risk (  05/18/2024)   SDOH Interventions:     Readmission Risk Interventions    05/18/2024    9:09 AM 04/07/2024    9:51 AM 04/05/2024   11:34 AM  Readmission Risk Prevention Plan  Transportation Screening Complete Complete Complete  Home Care Screening  Complete   Medication Review (RN CM)  Complete   HRI or Home Care Consult Complete  Complete  Social Work Consult for Recovery Care Planning/Counseling Complete  Complete   Palliative Care Screening Not Applicable  Not Applicable  Medication Review Oceanographer) Complete  Complete

## 2024-05-19 ENCOUNTER — Inpatient Hospital Stay (HOSPITAL_COMMUNITY)

## 2024-05-19 DIAGNOSIS — D62 Acute posthemorrhagic anemia: Secondary | ICD-10-CM

## 2024-05-19 DIAGNOSIS — K922 Gastrointestinal hemorrhage, unspecified: Secondary | ICD-10-CM | POA: Diagnosis not present

## 2024-05-19 DIAGNOSIS — D500 Iron deficiency anemia secondary to blood loss (chronic): Secondary | ICD-10-CM

## 2024-05-19 DIAGNOSIS — Z8711 Personal history of peptic ulcer disease: Secondary | ICD-10-CM

## 2024-05-19 LAB — CBC
HCT: 19.6 % — ABNORMAL LOW (ref 39.0–52.0)
HCT: 20.8 % — ABNORMAL LOW (ref 39.0–52.0)
HCT: 28.5 % — ABNORMAL LOW (ref 39.0–52.0)
Hemoglobin: 6.6 g/dL — CL (ref 13.0–17.0)
Hemoglobin: 6.6 g/dL — CL (ref 13.0–17.0)
Hemoglobin: 9.7 g/dL — ABNORMAL LOW (ref 13.0–17.0)
MCH: 28.8 pg (ref 26.0–34.0)
MCH: 30.3 pg (ref 26.0–34.0)
MCH: 30.6 pg (ref 26.0–34.0)
MCHC: 31.7 g/dL (ref 30.0–36.0)
MCHC: 33.7 g/dL (ref 30.0–36.0)
MCHC: 34 g/dL (ref 30.0–36.0)
MCV: 89.1 fL (ref 80.0–100.0)
MCV: 90.7 fL (ref 80.0–100.0)
MCV: 90.8 fL (ref 80.0–100.0)
Platelets: 227 K/uL (ref 150–400)
Platelets: 228 K/uL (ref 150–400)
Platelets: 267 K/uL (ref 150–400)
RBC: 2.16 MIL/uL — ABNORMAL LOW (ref 4.22–5.81)
RBC: 2.29 MIL/uL — ABNORMAL LOW (ref 4.22–5.81)
RBC: 3.2 MIL/uL — ABNORMAL LOW (ref 4.22–5.81)
RDW: 17 % — ABNORMAL HIGH (ref 11.5–15.5)
RDW: 17.8 % — ABNORMAL HIGH (ref 11.5–15.5)
RDW: 18.7 % — ABNORMAL HIGH (ref 11.5–15.5)
WBC: 13.8 K/uL — ABNORMAL HIGH (ref 4.0–10.5)
WBC: 16.5 K/uL — ABNORMAL HIGH (ref 4.0–10.5)
WBC: 18.3 K/uL — ABNORMAL HIGH (ref 4.0–10.5)
nRBC: 0 % (ref 0.0–0.2)
nRBC: 0.2 % (ref 0.0–0.2)
nRBC: 0.4 % — ABNORMAL HIGH (ref 0.0–0.2)

## 2024-05-19 LAB — BASIC METABOLIC PANEL WITH GFR
Anion gap: 7 (ref 5–15)
BUN: 46 mg/dL — ABNORMAL HIGH (ref 8–23)
CO2: 23 mmol/L (ref 22–32)
Calcium: 7.4 mg/dL — ABNORMAL LOW (ref 8.9–10.3)
Chloride: 108 mmol/L (ref 98–111)
Creatinine, Ser: 1.26 mg/dL — ABNORMAL HIGH (ref 0.61–1.24)
GFR, Estimated: >60 mL/min (ref >60)
Glucose, Bld: 126 mg/dL — ABNORMAL HIGH (ref 70–99)
Potassium: 4.7 mmol/L (ref 3.5–5.1)
Sodium: 138 mmol/L (ref 135–145)

## 2024-05-19 LAB — PREPARE RBC (CROSSMATCH)

## 2024-05-19 LAB — GLUCOSE, CAPILLARY: Glucose-Capillary: 110 mg/dL — ABNORMAL HIGH (ref 70–99)

## 2024-05-19 LAB — HEMOGLOBIN AND HEMATOCRIT, BLOOD
HCT: 20.3 % — ABNORMAL LOW (ref 39.0–52.0)
Hemoglobin: 6.9 g/dL — CL (ref 13.0–17.0)

## 2024-05-19 MED ORDER — FUROSEMIDE 10 MG/ML IJ SOLN
40.0000 mg | Freq: Once | INTRAMUSCULAR | Status: AC
Start: 2024-05-19 — End: 2024-05-19
  Administered 2024-05-19: 40 mg via INTRAVENOUS
  Filled 2024-05-19: qty 4

## 2024-05-19 MED ORDER — SODIUM CHLORIDE 0.9% IV SOLUTION: Freq: Once | INTRAVENOUS | Status: AC

## 2024-05-19 MED ORDER — FUROSEMIDE 10 MG/ML IJ SOLN
40.0000 mg | Freq: Once | INTRAMUSCULAR | Status: AC
  Administered 2024-05-19: 40 mg via INTRAVENOUS
  Filled 2024-05-19: qty 4

## 2024-05-19 MED ORDER — DIPHENHYDRAMINE HCL 25 MG PO CAPS
25.0000 mg | ORAL_CAPSULE | Freq: Once | ORAL | Status: AC
  Administered 2024-05-19: 25 mg via ORAL
  Filled 2024-05-19: qty 1

## 2024-05-19 MED ORDER — EPINEPHRINE 1 MG/10ML IJ SOSY
PREFILLED_SYRINGE | INTRAMUSCULAR | Status: AC
  Filled 2024-05-19: qty 10

## 2024-05-19 MED ORDER — IOHEXOL 350 MG/ML SOLN
100.0000 mL | Freq: Once | INTRAVENOUS | Status: AC | PRN
  Administered 2024-05-19: 100 mL via INTRAVENOUS

## 2024-05-19 NOTE — Progress Notes (Signed)
   05/19/24 0810  Provider Notification  Provider Name/Title Dr. Willette  Date Provider Notified 05/19/24  Time Provider Notified [phone]  Method of Notification Page  Notification Reason Critical Result  Test performed and critical result Hemogobin 6.9  Date Critical Result Received 05/19/24  Time Critical Result Received 0809  Provider response No new orders  Date of Provider Response 05/19/24  Time of Provider Response 0810   No new orders. To infuse 2 units of blood.

## 2024-05-19 NOTE — Progress Notes (Signed)
 Since the transfusion was stopped after the patient had a episode of increased work of breathing, Blood products stopped as close to 5 AM and  discarded in the Bio[employer] bin. Lab consulted if needed to return the blood product which they mentioned  if not initiating a transfusion reaction, won't need to return it. AC made aware.

## 2024-05-19 NOTE — Progress Notes (Incomplete)
 Blood Transfusion Stopped as per Dr. Bryn verbal order,

## 2024-05-19 NOTE — Plan of Care (Signed)

## 2024-05-19 NOTE — Progress Notes (Addendum)
 Gastroenterology Progress Note   Referring Provider: No ref. provider found Primary Care Physician:  [name], NP Primary Gastroenterologist:  Toribio Rubins, MD  Patient ID: David Callahan; [id number]; January 14, 1956   Subjective   No abdominal pain. Mild shortness of breath but no difficulty this morning. Receiving 2u PRBC during my exam and had returned from CT. Nursing overnight reported melena with some dark red streaks. No BM this morning. Denies nausea or vomiting. Requesting diet if nothing to be done today.   Objective   Vital signs in last 24 hours Temp:  [97.9 F (36.6 C)-99 F (37.2 C)] 98.4 F (36.9 C) (08/07 0848) Pulse Rate:  [51-97] 51 (08/07 0900) Resp:  [10-26] 18 (08/07 1000) BP: (87-182)/(34-90) 135/35 (08/07 1000) SpO2:  [84 %-100 %] 100 % (08/07 0848) Last BM Date : 05/17/24  Physical Exam General:   Alert and oriented, pleasant Head:  Normocephalic and atraumatic. Eyes:  No icterus, sclera clear. Conjuctiva pink.  Mouth:  mucosa pink and moist. Poor dentition.  Neck:  Supple, without thyromegaly or masses. 2L O2 Heart:  S1, S2 present, no murmurs noted. Lungs: Clear to auscultation bilaterally, without wheezing, rales, or rhonchi.  Abdomen:  Bowel sounds present, soft, non-tender, non-distended. No HSM or hernias noted. No rebound or guarding. No masses appreciated  Extremities:  Without clubbing or edema. Neurologic:  Alert and  oriented x4;  grossly normal neurologically. Psych:  Alert and cooperative. Normal mood and affect.  Intake/Output from previous day: 08/06 0701 - 08/07 0700 In: 2298.6 [I.V.:386.9; Blood:1911.7] Out: 2650 [Urine:2650] Intake/Output this shift: Total I/O In: 20 [I.V.:20] Out: -   Lab Results  Recent Labs    05/18/24 1727 05/18/24 2357 05/19/24 0640 05/19/24 0744  WBC 12.3* 13.8* 16.5*  --   HGB 6.7* 6.6* 6.6* 6.9*  HCT 21.0* 20.8* 19.6* 20.3*  PLT 273 267 228  --    BMET Recent Labs     05/18/24 0447 05/19/24 0640  NA 142 138  K 5.1 4.7  CL 110 108  CO2 22 23  GLUCOSE 118* 126*  BUN 52* 46*  CREATININE 1.36* 1.26*  CALCIUM  8.2* 7.4*   LFT Recent Labs    05/18/24 0447  PROT 5.0*  ALBUMIN 2.4*  AST 25  ALT 20  ALKPHOS 68  BILITOT 0.4   PT/INR Recent Labs    05/18/24 0447  LABPROT 16.0*  INR 1.2   Hepatitis Panel No results for input(s): HEPBSAG, HCVAB, HEPAIGM, HEPBIGM in the last 72 hours.   Studies/Results DG CHEST PORT 1 VIEW Result Date: 05/19/2024 CLINICAL DATA:  Acute respiratory failure with hypoxia. EXAM: PORTABLE CHEST 1 VIEW COMPARISON:  04/05/2024 FINDINGS: The lungs are clear without focal pneumonia, edema, pneumothorax or pleural effusion. Interstitial markings are diffusely coarsened with chronic features. The cardiopericardial silhouette is within normal limits for size. No acute bony abnormality. Degenerative changes noted right shoulder. Telemetry leads overlie the chest. IMPRESSION: Chronic interstitial coarsening without acute cardiopulmonary findings. Electronically Signed   By: Camellia Candle M.D.   On: 05/19/2024 07:14    Assessment  68 y.o. male with a history of peptic ulcer disease with prior admissions for upper GI bleed with large duodenal ulcer likely NSAID induced with visible vessel, HTN, asthma, COPD who re-presented to the ED 8/6 with hematemesis and dark stools and found to have a hemoglobin of 5.6 for which GI was consulted for further evaluation.  Anemia, hematemesis/coffee ground emesis, PUD: - As noted in consult documentation there  was history of large duodenal ulcer with visible vessel in May which was  suspected to be secondary to NSAIDs - Repeat hospitalization in June with subsequent EGD without any findings to explain symptoms. - Has been maintained on oral iron  and PPI. - Presented with hemoglobin 5.3 with low iron  studies as well as low B12 at 156. - BUN elevated, likely upper GI course, could be rapid  transit given red streaks and dark stool although unable to rule out distal small bowel bleed.  - Ongoing melena overnight, prior hematemesis and coffee-ground emesis.  Not adequately resuscitated today with Hgb remaining 6.6, one of the 2 units ordered for this morning previously was not completed secondary to shortness of breath and possible concern for transfusion reaction.  Most likely fluid overload therefore another 2u PRBC ordered by hospitalist. Although he remains hemodynamically stable he is not responding well to transfusion therefore will assess with CTA GI bleed scan to assess for active bleeding vessel and will keep plans for EGD +/- capsule deployment tomorrow if Hgb >7.   Plan / Recommendations  Clear liquids if CTA GI bleed negative NPO midnight EGD +/- capsule deployment tomorrow with Dr. Rourk PPI IV BID Trend h/h, transfuse Hgb <7 Continue carafate  Oral B12 supplementation. Avoid NSAIDs    LOS: 1 day   05/19/2024, 10:38 AM  Charmaine Melia, MSN, FNP-BC, AGACNP-BC [facility]   Update: CTA with focal outpouching within the first portion of the duodenum measuring 1.9 cm which could represent large ulcer or duodenal diverticulum and the GDA comes in close approximation with the wall and possibly the mucosal surface which could represent source of occult bleeding.  No active bleeding on imaging at this time.  This is likely possible visible vessel bleeding again from duodenal ulcer at same site as previously.  Will plan to transfuse as needed to keep hemoglobin above 7 and will repeat upper endoscopy tomorrow.  Charmaine Melia, MSN, APRN, FNP-BC, AGACNP-BC [facility]

## 2024-05-19 NOTE — Progress Notes (Signed)
 PROGRESS NOTE    Patient: David Callahan                            PCP: [name], NP                    DOB: 1956-08-07            DOA: 05/18/2024 FMW:[id number]             DOS: 05/19/2024, 12:12 PM   LOS: 1 day   Date of Service: The patient was seen and examined on 05/19/2024  Subjective:   The patient was seen and examined this morning, stable no acute distress. Overnight developed some shortness of breath after 30 years of PRBC transfusion Hemoglobin still at 6.9 this morning.  Denies any chest pain, improved shortness of breath.   Brief Narrative:   David Callahan is a 68 y.o. male with medical history significant for COPD, hypertension, and peptic ulcer disease who presents with hematemesis, black stools, and exertional dyspnea.   Patient reports that he was feeling well during the day yesterday but developed nausea and mild epigastric pain last night and vomited blood at approximately 11 PM.  He notes that his stool has been black for weeks but attributes this to his iron  supplementation.   He reports complete avoidance of NSAIDs and strict adherence with twice-daily PPI.    ED Course:  Normal heart rate and SBP in the 90s and greater.  Labs are most notable for BUN 52, creatinine 1.36, albumin 2.4, WBC 16,400, and hemoglobin 5.3.   ED physician sent a secure chat to GI (Dr. Eartha) and the patient was treated with 1 L NS, 80 mg IV Protonix , and Zofran .  2 units RBC were ordered for transfusion.       Assessment & Plan:   Principal Problem:   Acute upper GI bleed Active Problems:   COPD (chronic obstructive pulmonary disease) (HCC)   AKI (acute kidney injury) (HCC)   Essential hypertension   Assessment/Plan    Acute upper GI bleed; symptomatic anemia  - GI consulted-noted slow improvement in hemoglobin despite 3 units of PRBC blood transfusion -hemoglobin of 6.9 -Pursuing with 2 more U PRBC transfusion today 05/19/2024 -Discussed with GI-obtaining CT  abdomen pelvis - NPO: In anticipation of possible emergent scoping   - Developed shortness of breath after third units of PRBC blood transfusion -Received Lasix  overnight, initiating another dose of 40 mg of Lasix  _ IV Protonix  40 twice daily  - Continue bowel rest and IV PPI, proceed with transfusion of 2 units RBCs, follow serial CBCs   -Monitoring H&H following, for any further blood transfusion     Latest Ref Rng & Units 05/19/2024    7:44 AM 05/19/2024    6:40 AM 05/18/2024   11:57 PM  CBC  WBC 4.0 - 10.5 K/uL  16.5  13.8   Hemoglobin 13.0 - 17.0 g/dL 6.9  6.6  6.6   Hematocrit 39.0 - 52.0 % 20.3  19.6  20.8   Platelets 150 - 400 K/uL  228  267       Acute respiratory distress -in the setting of volume overload, COPD - Likely due to volume overload with blood transfusion IVF - Continue with IV Lasix  -continuing on 2 L of oxygen , currently satting 100% - Nebs as needed  leukocytosis  - Signs of infection likely reactive - Trend closely afebrile,  AKI  - Likely prerenal in setting of GIB with severe anemia  - Hold ACE-i, transfuse RBCs, continue IVF hydration, repeat chem panel in am   Lab Results  Component Value Date   CREATININE 1.36 (H) 05/18/2024   CREATININE 1.05 05/08/2024   CREATININE 1.09 04/07/2024     Hypertension -  POA hypotensive, SBP in 90s -Exacerbated by hypovolemia, - Blood pressure stabilized now hypertensive - Will utilize as needed IV hydralazine , IV Lasix , holding BP meds     COPD  - With mild exacerbation likely due to volume overload -Continue oxygen  supplements currently satting 100% on 2 L - Continue Incruse Ellipta  and as-needed short-acting bronchodilators       -------------------------------------------------------------------------------------------------------------------------------------------- Nutritional status:  The patient's BMI is: Body mass index is 25.12 kg/m. I agree with the assessment and plan as outlined   Nutrition Status:     DVT prophylaxis:  SCDs Start: 05/18/24 0605   Code Status:   Code Status: Full Code  Family Communication: No family member present at bedside-  -Advance care planning has been discussed.   Admission status:   Status is: Inpatient Remains inpatient appropriate because: Blood transfusion, GI evaluation for acute GI bleed   Disposition: From  - home             Planning for discharge in 1-2 days   Procedures:   No admission procedures for hospital encounter.   Antimicrobials:  Anti-infectives (From admission, onward)    None        Medication:   sodium chloride    Intravenous Once   sodium chloride    Intravenous Once   atorvastatin   40 mg Oral Daily   Chlorhexidine  Gluconate Cloth  6 each Topical Q0600   cyclobenzaprine   10 mg Oral QHS   EPINEPHrine        pantoprazole  (PROTONIX ) IV  40 mg Intravenous Q12H   rOPINIRole   1 mg Oral Daily   sodium chloride  flush  3 mL Intravenous Q12H   umeclidinium bromide   1 puff Inhalation Daily    acetaminophen  **OR** acetaminophen , EPINEPHrine , fentaNYL  (SUBLIMAZE ) injection, ipratropium-albuterol , oxyCODONE , prochlorperazine , traZODone    Objective:   Vitals:   05/19/24 1053 05/19/24 1100 05/19/24 1118 05/19/24 1139  BP: (!) 146/41 (!) 127/40 130/60 (!) 148/65  Pulse:  70 66 69  Resp:  18 (!) 22   Temp: 98.3 F (36.8 C)  98.3 F (36.8 C) 98.4 F (36.9 C)  TempSrc: Oral  Oral Oral  SpO2:  100% 100% 100%  Weight:      Height:        Intake/Output Summary (Last 24 hours) at 05/19/2024 1212 Last data filed at 05/19/2024 1133 Gross per 24 hour  Intake 1983.08 ml  Output 3225 ml  Net -1241.92 ml   Filed Weights   05/18/24 0751  Weight: 79.4 kg     Physical examination:       General:  AAO x 3,  cooperative, no distress;   HEENT:  Normocephalic, PERRL, otherwise with in Normal limits   Neuro:  CNII-XII intact. , normal motor and sensation, reflexes intact   Lungs:   Clear to  auscultation BL, Respirations unlabored,  No wheezes / crackles  Cardio:    S1/S2, RRR, No murmure, No Rubs or Gallops   Abdomen:  Soft, non-tender, bowel sounds active all four quadrants, no guarding or peritoneal signs.  Muscular  skeletal:  Limited exam -global generalized weaknesses - in bed, able to move all 4 extremities,   2+ pulses,  symmetric, No pitting edema  Skin:  Dry, warm to touch, negative for any Rashes,  Wounds: Please see nursing documentation          LABs:     Latest Ref Rng & Units 05/19/2024    7:44 AM 05/19/2024    6:40 AM 05/18/2024   11:57 PM  CBC  WBC 4.0 - 10.5 K/uL  16.5  13.8   Hemoglobin 13.0 - 17.0 g/dL 6.9  6.6  6.6   Hematocrit 39.0 - 52.0 % 20.3  19.6  20.8   Platelets 150 - 400 K/uL  228  267       Latest Ref Rng & Units 05/19/2024    6:40 AM 05/18/2024    4:47 AM 05/08/2024    6:35 PM  CMP  Glucose 70 - 99 mg/dL 873  881  99   BUN 8 - 23 mg/dL 46  52  19   Creatinine 0.61 - 1.24 mg/dL 8.73  8.63  8.94   Sodium 135 - 145 mmol/L 138  142  140   Potassium 3.5 - 5.1 mmol/L 4.7  5.1  3.4   Chloride 98 - 111 mmol/L 108  110  107   CO2 22 - 32 mmol/L 23  22  24    Calcium  8.9 - 10.3 mg/dL 7.4  8.2  8.4   Total Protein 6.5 - 8.1 g/dL  5.0  5.8   Total Bilirubin 0.0 - 1.2 mg/dL  0.4  0.7   Alkaline Phos 38 - 126 U/L  68  81   AST 15 - 41 U/L  25  27   ALT 0 - 44 U/L  20  26        Micro Results Recent Results (from the past 240 hours)  MRSA Next Gen by PCR, Nasal     Status: None   Collection Time: 05/18/24  7:50 AM   Specimen: Nasal Mucosa; Nasal Swab  Result Value Ref Range Status   MRSA by PCR Next Gen NOT DETECTED NOT DETECTED Final    Comment: (NOTE) The GeneXpert MRSA Assay (FDA approved for NASAL specimens only), is one component of a comprehensive MRSA colonization surveillance program. It is not intended to diagnose MRSA infection nor to guide or monitor treatment for MRSA infections. Test performance is not FDA approved in  patients less than 51 years old. Performed at [facility], [address] [place], [place] [address]     Radiology Reports DG CHEST PORT 1 VIEW Result Date: 05/19/2024 CLINICAL DATA:  Acute respiratory failure with hypoxia. EXAM: PORTABLE CHEST 1 VIEW COMPARISON:  04/05/2024 FINDINGS: The lungs are clear without focal pneumonia, edema, pneumothorax or pleural effusion. Interstitial markings are diffusely coarsened with chronic features. The cardiopericardial silhouette is within normal limits for size. No acute bony abnormality. Degenerative changes noted right shoulder. Telemetry leads overlie the chest. IMPRESSION: Chronic interstitial coarsening without acute cardiopulmonary findings. Electronically Signed   By: Camellia Candle M.D.   On: 05/19/2024 07:14    SIGNED: Adriana DELENA Grams, MD, FHM. FAAFP. David Callahan - [facility] Attending to monitor and ICU setting for acute symptomatic anemia, with hypotension, and close monitoring  Critical care time spent - 55 min.  In seeing, evaluating and examining the patient. Reviewing medical records, labs, drawn plan of care. [facility],  Pager (please use [email or web address] to page/ text) Please use Epic Secure Chat for non-urgent communication (7AM-7PM)  If 7PM-7AM, please contact night-coverage [email or web address], 05/19/2024, 12:12 PM

## 2024-05-19 NOTE — Progress Notes (Signed)
 Critical results Documentation   05/19/24 0035  Provider Notification  Provider Name/Title Dr. Bernardino Come  Date Provider Notified 05/19/24  Time Provider Notified [phone]  Method of Notification Page  Notification Reason Critical Result  Test performed and critical result Hemoglobin 6.6  Date Critical Result Received 05/19/24  Time Critical Result Received 0034  Provider response See new orders  Date of Provider Response 05/19/24  Time of Provider Response [phone]

## 2024-05-19 NOTE — Progress Notes (Signed)
 Patient called the RN, when in the room, found patient to be short of breath, with increased work of breathing, Diaphoretic, started on oxygen  @ NRB 15 litres, coarse crackles on auscultation, MD made aware and lasix  40 mg given, CBG 110, Transfusion stopped, Cxray done, RT, MD and the RN at bedside, MD doesn't think is the transfusion reaction but wants the transfusion to be on hold/stopped, Lab made aware , told this RN if not the transfusion reaction does not need to bring the blood back to the lab. Patient is still in NRB at 15 litres, getting better, awaiting for cxray results. Will endorse to the oncoming RN.

## 2024-05-20 ENCOUNTER — Encounter (HOSPITAL_COMMUNITY): Payer: Self-pay | Admitting: Family Medicine

## 2024-05-20 ENCOUNTER — Encounter (HOSPITAL_COMMUNITY)
Admission: EM | Disposition: A | Discharge status: Home Health | Payer: Self-pay | Source: Home / Self Care | Attending: Family Medicine

## 2024-05-20 ENCOUNTER — Inpatient Hospital Stay (HOSPITAL_COMMUNITY): Admitting: Anesthesiology

## 2024-05-20 ENCOUNTER — Inpatient Hospital Stay (HOSPITAL_COMMUNITY)

## 2024-05-20 ENCOUNTER — Other Ambulatory Visit: Payer: Self-pay

## 2024-05-20 ENCOUNTER — Inpatient Hospital Stay (HOSPITAL_COMMUNITY): Admitting: Certified Registered"

## 2024-05-20 ENCOUNTER — Telehealth: Payer: Self-pay | Admitting: Internal Medicine

## 2024-05-20 DIAGNOSIS — F17210 Nicotine dependence, cigarettes, uncomplicated: Secondary | ICD-10-CM

## 2024-05-20 DIAGNOSIS — N179 Acute kidney failure, unspecified: Secondary | ICD-10-CM | POA: Diagnosis not present

## 2024-05-20 DIAGNOSIS — K264 Chronic or unspecified duodenal ulcer with hemorrhage: Secondary | ICD-10-CM

## 2024-05-20 DIAGNOSIS — I509 Heart failure, unspecified: Secondary | ICD-10-CM | POA: Diagnosis not present

## 2024-05-20 DIAGNOSIS — J449 Chronic obstructive pulmonary disease, unspecified: Secondary | ICD-10-CM | POA: Diagnosis not present

## 2024-05-20 DIAGNOSIS — D72829 Elevated white blood cell count, unspecified: Secondary | ICD-10-CM

## 2024-05-20 DIAGNOSIS — K922 Gastrointestinal hemorrhage, unspecified: Principal | ICD-10-CM | POA: Diagnosis present

## 2024-05-20 DIAGNOSIS — I110 Hypertensive heart disease with heart failure: Secondary | ICD-10-CM

## 2024-05-20 DIAGNOSIS — I2510 Atherosclerotic heart disease of native coronary artery without angina pectoris: Secondary | ICD-10-CM | POA: Diagnosis not present

## 2024-05-20 DIAGNOSIS — I10 Essential (primary) hypertension: Secondary | ICD-10-CM

## 2024-05-20 DIAGNOSIS — I469 Cardiac arrest, cause unspecified: Secondary | ICD-10-CM | POA: Diagnosis present

## 2024-05-20 DIAGNOSIS — Z978 Presence of other specified devices: Secondary | ICD-10-CM

## 2024-05-20 HISTORY — PX: ESOPHAGOGASTRODUODENOSCOPY: SHX5428

## 2024-05-20 HISTORY — PX: LAPAROTOMY: SHX154

## 2024-05-20 LAB — BASIC METABOLIC PANEL WITH GFR
Anion gap: 9 (ref 5–15)
BUN: 52 mg/dL — ABNORMAL HIGH (ref 8–23)
CO2: 26 mmol/L (ref 22–32)
Calcium: 7.9 mg/dL — ABNORMAL LOW (ref 8.9–10.3)
Chloride: 102 mmol/L (ref 98–111)
Creatinine, Ser: 1.45 mg/dL — ABNORMAL HIGH (ref 0.61–1.24)
GFR, Estimated: 53 mL/min — ABNORMAL LOW (ref >60)
Glucose, Bld: 109 mg/dL — ABNORMAL HIGH (ref 70–99)
Potassium: 4.4 mmol/L (ref 3.5–5.1)
Sodium: 137 mmol/L (ref 135–145)

## 2024-05-20 LAB — HEMOGLOBIN AND HEMATOCRIT, BLOOD
HCT: 21.8 % — ABNORMAL LOW (ref 39.0–52.0)
HCT: 22.6 % — ABNORMAL LOW (ref 39.0–52.0)
Hemoglobin: 7.3 g/dL — ABNORMAL LOW (ref 13.0–17.0)
Hemoglobin: 7.2 g/dL — ABNORMAL LOW (ref 13.0–17.0)

## 2024-05-20 LAB — CBC
HCT: 23 % — ABNORMAL LOW (ref 39.0–52.0)
HCT: 25 % — ABNORMAL LOW (ref 39.0–52.0)
Hemoglobin: 7.8 g/dL — ABNORMAL LOW (ref 13.0–17.0)
Hemoglobin: 8.4 g/dL — ABNORMAL LOW (ref 13.0–17.0)
MCH: 30.2 pg (ref 26.0–34.0)
MCH: 29.4 pg (ref 26.0–34.0)
MCHC: 33.9 g/dL (ref 30.0–36.0)
MCHC: 33.6 g/dL (ref 30.0–36.0)
MCV: 89.1 fL (ref 80.0–100.0)
MCV: 87.4 fL (ref 80.0–100.0)
Platelets: 185 K/uL (ref 150–400)
Platelets: 174 K/uL (ref 150–400)
RBC: 2.58 MIL/uL — ABNORMAL LOW (ref 4.22–5.81)
RBC: 2.86 MIL/uL — ABNORMAL LOW (ref 4.22–5.81)
RDW: 17.3 % — ABNORMAL HIGH (ref 11.5–15.5)
RDW: 16.7 % — ABNORMAL HIGH (ref 11.5–15.5)
WBC: 19.3 K/uL — ABNORMAL HIGH (ref 4.0–10.5)
WBC: 29.1 K/uL — ABNORMAL HIGH (ref 4.0–10.5)
nRBC: 0.3 % — ABNORMAL HIGH (ref 0.0–0.2)
nRBC: 0.2 % (ref 0.0–0.2)

## 2024-05-20 LAB — PROTIME-INR
INR: 1.3 — ABNORMAL HIGH (ref 0.8–1.2)
Prothrombin Time: 16.5 s — ABNORMAL HIGH (ref 11.4–15.2)

## 2024-05-20 LAB — POCT I-STAT 7, (LYTES, BLD GAS, ICA,H+H)
Acid-base deficit: 1 mmol/L (ref 0.0–2.0)
Bicarbonate: 24.7 mmol/L (ref 20.0–28.0)
Calcium, Ion: 1.09 mmol/L — ABNORMAL LOW (ref 1.15–1.40)
HCT: 22 % — ABNORMAL LOW (ref 39.0–52.0)
Hemoglobin: 7.5 g/dL — ABNORMAL LOW (ref 13.0–17.0)
O2 Saturation: 100 %
Patient temperature: 98.7
Potassium: 4.6 mmol/L (ref 3.5–5.1)
Sodium: 136 mmol/L (ref 135–145)
TCO2: 26 mmol/L (ref 22–32)
pCO2 arterial: 42.8 mmHg (ref 32–48)
pH, Arterial: 7.37 (ref 7.35–7.45)
pO2, Arterial: 308 mmHg — ABNORMAL HIGH (ref 83–108)

## 2024-05-20 LAB — COMPREHENSIVE METABOLIC PANEL WITH GFR
ALT: 13 U/L (ref 0–44)
AST: 16 U/L (ref 15–41)
Albumin: 1.8 g/dL — ABNORMAL LOW (ref 3.5–5.0)
Alkaline Phosphatase: 41 U/L (ref 38–126)
Anion gap: 8 (ref 5–15)
BUN: 53 mg/dL — ABNORMAL HIGH (ref 8–23)
CO2: 24 mmol/L (ref 22–32)
Calcium: 7.4 mg/dL — ABNORMAL LOW (ref 8.9–10.3)
Chloride: 105 mmol/L (ref 98–111)
Creatinine, Ser: 1.73 mg/dL — ABNORMAL HIGH (ref 0.61–1.24)
GFR, Estimated: 43 mL/min — ABNORMAL LOW (ref >60)
Glucose, Bld: 134 mg/dL — ABNORMAL HIGH (ref 70–99)
Potassium: 4.9 mmol/L (ref 3.5–5.1)
Sodium: 137 mmol/L (ref 135–145)
Total Bilirubin: 0.5 mg/dL (ref 0.0–1.2)
Total Protein: 3.3 g/dL — ABNORMAL LOW (ref 6.5–8.1)

## 2024-05-20 LAB — PREPARE RBC (CROSSMATCH)

## 2024-05-20 LAB — MRSA NEXT GEN BY PCR, NASAL: MRSA by PCR Next Gen: NOT DETECTED

## 2024-05-20 LAB — GLUCOSE, CAPILLARY
Glucose-Capillary: 121 mg/dL — ABNORMAL HIGH (ref 70–99)
Glucose-Capillary: 142 mg/dL — ABNORMAL HIGH (ref 70–99)

## 2024-05-20 LAB — MAGNESIUM: Magnesium: 1.7 mg/dL (ref 1.7–2.4)

## 2024-05-20 LAB — PHOSPHORUS: Phosphorus: 5.4 mg/dL — ABNORMAL HIGH (ref 2.5–4.6)

## 2024-05-20 SURGERY — EGD (ESOPHAGOGASTRODUODENOSCOPY)
Anesthesia: General

## 2024-05-20 SURGERY — LAPAROTOMY, EXPLORATORY
Anesthesia: General | Site: Abdomen

## 2024-05-20 MED ORDER — PHENYLEPHRINE HCL-NACL 20-0.9 MG/250ML-% IV SOLN
INTRAVENOUS | Status: AC
Start: 2024-05-20 — End: 2024-05-20
  Filled 2024-05-20: qty 250

## 2024-05-20 MED ORDER — FENTANYL 2500MCG IN NS 250ML (10MCG/ML) PREMIX INFUSION
0.0000 ug/h | INTRAVENOUS | Status: DC
  Administered 2024-05-20: 25 ug/h via INTRAVENOUS

## 2024-05-20 MED ORDER — PROPOFOL 1000 MG/100ML IV EMUL
0.0000 ug/kg/min | INTRAVENOUS | Status: DC
  Administered 2024-05-20: 40 ug/kg/min via INTRAVENOUS
  Administered 2024-05-20: 20 ug/kg/min via INTRAVENOUS
  Administered 2024-05-20: 10 ug/kg/min via INTRAVENOUS
  Administered 2024-05-21: 30 ug/kg/min via INTRAVENOUS
  Filled 2024-05-20: qty 100

## 2024-05-20 MED ORDER — SURGIFLO WITH THROMBIN (HEMOSTATIC MATRIX KIT) OPTIME
TOPICAL | Status: DC | PRN
  Administered 2024-05-20: 1 via TOPICAL

## 2024-05-20 MED ORDER — ORAL CARE MOUTH RINSE: 15.0000 mL | OROMUCOSAL | Status: DC | PRN

## 2024-05-20 MED ORDER — TRANEXAMIC ACID-NACL 1000-0.7 MG/100ML-% IV SOLN
1000.0000 mg | Freq: Once | INTRAVENOUS | Status: AC
  Administered 2024-05-20: 1000 mg via INTRAVENOUS
  Filled 2024-05-20 (×2): qty 100

## 2024-05-20 MED ORDER — ACETAMINOPHEN 325 MG PO TABS: 650.0000 mg | ORAL_TABLET | ORAL | Status: DC | PRN

## 2024-05-20 MED ORDER — FENTANYL 2500MCG IN NS 250ML (10MCG/ML) PREMIX INFUSION
INTRAVENOUS | Status: AC
  Filled 2024-05-20: qty 250

## 2024-05-20 MED ORDER — LIDOCAINE 2% (20 MG/ML) 5 ML SYRINGE
INTRAMUSCULAR | Status: DC | PRN
Start: 2024-05-20 — End: 2024-05-21
  Administered 2024-05-20: 100 mg via INTRAVENOUS

## 2024-05-20 MED ORDER — SODIUM CHLORIDE 0.9% IV SOLUTION: Freq: Once | INTRAVENOUS | Status: AC

## 2024-05-20 MED ORDER — IPRATROPIUM-ALBUTEROL 0.5-2.5 (3) MG/3ML IN SOLN: 3.0000 mL | Freq: Four times a day (QID) | RESPIRATORY_TRACT | Status: DC | PRN

## 2024-05-20 MED ORDER — DOCUSATE SODIUM 100 MG PO CAPS: 100.0000 mg | ORAL_CAPSULE | Freq: Two times a day (BID) | ORAL | Status: DC | PRN

## 2024-05-20 MED ORDER — FENTANYL CITRATE (PF) 100 MCG/2ML IJ SOLN
INTRAMUSCULAR | Status: AC
Start: 2024-05-20 — End: 2024-05-20
  Filled 2024-05-20: qty 2

## 2024-05-20 MED ORDER — SODIUM CHLORIDE 0.9% IV SOLUTION: Freq: Once | INTRAVENOUS | Status: DC

## 2024-05-20 MED ORDER — PROPOFOL 500 MG/50ML IV EMUL
INTRAVENOUS | Status: DC | PRN
  Administered 2024-05-20: 60 mg via INTRAVENOUS
  Administered 2024-05-20: 20 mg via INTRAVENOUS
  Administered 2024-05-20: 150 ug/kg/min via INTRAVENOUS

## 2024-05-20 MED ORDER — CEFAZOLIN SODIUM-DEXTROSE 2-3 GM-%(50ML) IV SOLR
INTRAVENOUS | Status: DC | PRN
  Administered 2024-05-20: 2 g via INTRAVENOUS

## 2024-05-20 MED ORDER — MIDAZOLAM HCL 2 MG/2ML IJ SOLN
INTRAMUSCULAR | Status: AC
  Filled 2024-05-20: qty 2

## 2024-05-20 MED ORDER — ARFORMOTEROL TARTRATE 15 MCG/2ML IN NEBU
15.0000 ug | INHALATION_SOLUTION | Freq: Two times a day (BID) | RESPIRATORY_TRACT | Status: DC
  Administered 2024-05-20 – 2024-05-26 (×14): 15 ug via RESPIRATORY_TRACT
  Filled 2024-05-20 (×11): qty 2

## 2024-05-20 MED ORDER — CALCIUM GLUCONATE-NACL 2-0.675 GM/100ML-% IV SOLN
2.0000 g | Freq: Once | INTRAVENOUS | Status: AC
  Administered 2024-05-20: 2000 mg via INTRAVENOUS
  Filled 2024-05-20: qty 100

## 2024-05-20 MED ORDER — IPRATROPIUM-ALBUTEROL 0.5-2.5 (3) MG/3ML IN SOLN: 3.0000 mL | Freq: Four times a day (QID) | RESPIRATORY_TRACT | Status: DC

## 2024-05-20 MED ORDER — ORAL CARE MOUTH RINSE
15.0000 mL | OROMUCOSAL | Status: DC | PRN
  Administered 2024-05-20: 15 mL via OROMUCOSAL

## 2024-05-20 MED ORDER — PHENYLEPHRINE 80 MCG/ML (10ML) SYRINGE FOR IV PUSH (FOR BLOOD PRESSURE SUPPORT)
PREFILLED_SYRINGE | INTRAVENOUS | Status: AC
  Filled 2024-05-20: qty 10

## 2024-05-20 MED ORDER — SODIUM CHLORIDE 0.9 % IV SOLN: INTRAVENOUS | Status: AC | PRN

## 2024-05-20 MED ORDER — MIDAZOLAM HCL 2 MG/2ML IJ SOLN
INTRAMUSCULAR | Status: DC | PRN
  Administered 2024-05-20: 2 mg via INTRAVENOUS

## 2024-05-20 MED ORDER — ROCURONIUM BROMIDE 10 MG/ML (PF) SYRINGE
PREFILLED_SYRINGE | INTRAVENOUS | Status: DC | PRN
  Administered 2024-05-20: 40 mg via INTRAVENOUS

## 2024-05-20 MED ORDER — SALINE SPRAY 0.65 % NA SOLN: 1.0000 | NASAL | Status: DC | PRN

## 2024-05-20 MED ORDER — ORAL CARE MOUTH RINSE
15.0000 mL | OROMUCOSAL | Status: DC
  Administered 2024-05-20 – 2024-05-21 (×11): 15 mL via OROMUCOSAL

## 2024-05-20 MED ORDER — CEFAZOLIN SODIUM-DEXTROSE 1-4 GM/50ML-% IV SOLN
1.0000 g | Freq: Three times a day (TID) | INTRAVENOUS | Status: AC
  Administered 2024-05-20 – 2024-05-22 (×6): 1 g via INTRAVENOUS
  Filled 2024-05-20 (×7): qty 50

## 2024-05-20 MED ORDER — BUPIVACAINE HCL (PF) 0.5 % IJ SOLN
INTRAMUSCULAR | Status: AC
  Filled 2024-05-20: qty 30

## 2024-05-20 MED ORDER — SODIUM CHLORIDE 0.9 % IR SOLN
Status: DC | PRN
  Administered 2024-05-20 (×2): 1000 mL

## 2024-05-20 MED ORDER — DOCUSATE SODIUM 50 MG/5ML PO LIQD: 100.0000 mg | Freq: Two times a day (BID) | ORAL | Status: DC

## 2024-05-20 MED ORDER — POLYETHYLENE GLYCOL 3350 17 G PO PACK: 17.0000 g | PACK | Freq: Every day | ORAL | Status: DC | PRN

## 2024-05-20 MED ORDER — SODIUM CHLORIDE 0.9 % IV SOLN: INTRAVENOUS | Status: DC

## 2024-05-20 MED ORDER — ORAL CARE MOUTH RINSE: 15.0000 mL | OROMUCOSAL | Status: DC

## 2024-05-20 MED ORDER — LACTATED RINGERS IV SOLN: INTRAVENOUS | Status: DC

## 2024-05-20 MED ORDER — FENTANYL CITRATE PF 50 MCG/ML IJ SOSY: 25.0000 ug | PREFILLED_SYRINGE | INTRAMUSCULAR | Status: DC | PRN

## 2024-05-20 MED ORDER — SODIUM CHLORIDE (PF) 0.9 % IJ SOLN
PREFILLED_SYRINGE | INTRAMUSCULAR | Status: DC | PRN
  Administered 2024-05-20: 6 mL

## 2024-05-20 MED ORDER — PROPOFOL 1000 MG/100ML IV EMUL
INTRAVENOUS | Status: AC
  Filled 2024-05-20: qty 100

## 2024-05-20 MED ORDER — REVEFENACIN 175 MCG/3ML IN SOLN
175.0000 ug | Freq: Every day | RESPIRATORY_TRACT | Status: DC
  Administered 2024-05-21 – 2024-05-26 (×5): 175 ug via RESPIRATORY_TRACT
  Filled 2024-05-20 (×5): qty 3

## 2024-05-20 MED ORDER — FAMOTIDINE 20 MG PO TABS: 20.0000 mg | ORAL_TABLET | Freq: Every day | ORAL | Status: DC

## 2024-05-20 MED ORDER — POLYETHYLENE GLYCOL 3350 17 G PO PACK: 17.0000 g | PACK | Freq: Every day | ORAL | Status: DC

## 2024-05-20 MED ORDER — ROCURONIUM BROMIDE 100 MG/10ML IV SOLN
INTRAVENOUS | Status: DC | PRN
  Administered 2024-05-20: 50 mg via INTRAVENOUS

## 2024-05-20 MED ORDER — CEFAZOLIN SODIUM-DEXTROSE 2-4 GM/100ML-% IV SOLN
INTRAVENOUS | Status: AC
Start: 2024-05-20 — End: 2024-05-21
  Filled 2024-05-20: qty 100

## 2024-05-20 MED ORDER — FAMOTIDINE IN NACL 20-0.9 MG/50ML-% IV SOLN
20.0000 mg | Freq: Two times a day (BID) | INTRAVENOUS | Status: DC
  Administered 2024-05-20: 20 mg via INTRAVENOUS
  Filled 2024-05-20: qty 50

## 2024-05-20 MED ORDER — FENTANYL CITRATE (PF) 100 MCG/2ML IJ SOLN
INTRAMUSCULAR | Status: AC
  Filled 2024-05-20: qty 2

## 2024-05-20 MED ORDER — BUPIVACAINE HCL (PF) 0.5 % IJ SOLN
INTRAMUSCULAR | Status: DC | PRN
  Administered 2024-05-20: 30 mL

## 2024-05-20 MED ORDER — LACTATED RINGERS IV SOLN
INTRAVENOUS | Status: DC | PRN
Start: 2024-05-20 — End: 2024-05-20

## 2024-05-20 MED ORDER — SUCCINYLCHOLINE CHLORIDE 200 MG/10ML IV SOSY
PREFILLED_SYRINGE | INTRAVENOUS | Status: DC | PRN
  Administered 2024-05-20: 100 mg via INTRAVENOUS

## 2024-05-20 MED ORDER — PROPOFOL 10 MG/ML IV BOLUS
INTRAVENOUS | Status: DC | PRN
  Administered 2024-05-20: 100 mg via INTRAVENOUS

## 2024-05-20 SURGICAL SUPPLY — 38 items
CHLORAPREP W/TINT 26 (MISCELLANEOUS) ×1 IMPLANT
CLOTH BEACON ORANGE TIMEOUT ST (SAFETY) ×1 IMPLANT
COVER LIGHT HANDLE (MISCELLANEOUS) IMPLANT
COVER LIGHT HANDLE STERIS (MISCELLANEOUS) ×2 IMPLANT
DRAPE WARM FLUID 44X44 (DRAPES) ×1 IMPLANT
DRSG OPSITE POSTOP 4X10 (GAUZE/BANDAGES/DRESSINGS) IMPLANT
ELECT BLADE 6 FLAT ULTRCLN (ELECTRODE) IMPLANT
ELECTRODE REM PT RTRN 9FT ADLT (ELECTROSURGICAL) ×1 IMPLANT
EVACUATOR DRAINAGE 10X20 100CC (DRAIN) IMPLANT
GLOVE BIOGEL PI IND STRL 6.5 (GLOVE) ×1 IMPLANT
GLOVE BIOGEL PI IND STRL 7.0 (GLOVE) ×2 IMPLANT
GLOVE SURG SS PI 6.5 STRL IVOR (GLOVE) ×2 IMPLANT
GOWN STRL REUS W/TWL LRG LVL3 (GOWN DISPOSABLE) ×3 IMPLANT
HANDLE SUCTION POOLE (INSTRUMENTS) ×1 IMPLANT
INST SET MAJOR GENERAL (KITS) ×1 IMPLANT
KIT TURNOVER KIT A (KITS) ×1 IMPLANT
LIGASURE IMPACT 36 18CM CVD LR (INSTRUMENTS) IMPLANT
MANIFOLD NEPTUNE II (INSTRUMENTS) ×1 IMPLANT
NDL HYPO 18GX1.5 BLUNT FILL (NEEDLE) ×1 IMPLANT
NDL HYPO 21X1.5 SAFETY (NEEDLE) ×1 IMPLANT
NEEDLE HYPO 18GX1.5 BLUNT FILL (NEEDLE) ×1 IMPLANT
NEEDLE HYPO 21X1.5 SAFETY (NEEDLE) ×1 IMPLANT
NS IRRIG 1000ML POUR BTL (IV SOLUTION) ×2 IMPLANT
PACK MAJOR ABDOMINAL (CUSTOM PROCEDURE TRAY) ×1 IMPLANT
PAD ARMBOARD POSITIONER FOAM (MISCELLANEOUS) ×1 IMPLANT
PENCIL SMOKE EVACUATOR COATED (MISCELLANEOUS) ×1 IMPLANT
POSITIONER HEAD 8X9X4 ADT (SOFTGOODS) ×1 IMPLANT
RETRACTOR WND ALEXIS-O 25 LRG (MISCELLANEOUS) IMPLANT
SET BASIN LINEN APH (SET/KITS/TRAYS/PACK) ×1 IMPLANT
SPONGE DRAIN TRACH 4X4 STRL 2S (GAUZE/BANDAGES/DRESSINGS) IMPLANT
SPONGE T-LAP 18X18 ~~LOC~~+RFID (SPONGE) ×1 IMPLANT
STAPLER VISISTAT (STAPLE) ×1 IMPLANT
SURGIFLO W/THROMBIN 8M KIT (HEMOSTASIS) IMPLANT
SUT PDS AB CT VIOLET #0 27IN (SUTURE) ×2 IMPLANT
SUT SILK 2 0 SH (SUTURE) IMPLANT
SUT SILK 3 0 SH CR/8 (SUTURE) ×1 IMPLANT
SYR 30ML LL (SYRINGE) ×2 IMPLANT
TRAY FOLEY MTR SLVR 16FR STAT (SET/KITS/TRAYS/PACK) ×1 IMPLANT

## 2024-05-20 NOTE — Anesthesia Preprocedure Evaluation (Addendum)
 Anesthesia Evaluation  Patient identified by MRN, date of birth, ID band Patient awake    Reviewed: Allergy & Precautions, H&P , NPO status , Patient's Chart, lab work & pertinent test results  Airway Mallampati: II  TM Distance: >3 FB Neck ROM: Full    Dental  (+) Edentulous Lower   Pulmonary asthma , sleep apnea , pneumonia, COPD, Current Smoker and Patient abstained from smoking.  Hx pleural effusion   Pulmonary exam normal breath sounds clear to auscultation       Cardiovascular hypertension, +CHF  Normal cardiovascular exam+ Valvular Problems/Murmurs  Rhythm:Regular Rate:Normal  EF 60-65% Severe LAE   Neuro/Psych negative neurological ROS  negative psych ROS   GI/Hepatic Neg liver ROS, PUD,,,  Endo/Other  negative endocrine ROS    Renal/GU Renal disease  negative genitourinary   Musculoskeletal negative musculoskeletal ROS (+)    Abdominal   Peds negative pediatric ROS (+)  Hematology  (+) Blood dyscrasia, anemia   Anesthesia Other Findings   Reproductive/Obstetrics negative OB ROS                              Anesthesia Physical Anesthesia Plan  ASA: 4 and emergent  Anesthesia Plan: General   Post-op Pain Management:    Induction: Intravenous  PONV Risk Score and Plan:   Airway Management Planned: Nasal Cannula  Additional Equipment:   Intra-op Plan:   Post-operative Plan:   Informed Consent: I have reviewed the patients History and Physical, chart, labs and discussed the procedure including the risks, benefits and alternatives for the proposed anesthesia with the patient or authorized representative who has indicated his/her understanding and acceptance.       Plan Discussed with: CRNA  Anesthesia Plan Comments:          Anesthesia Quick Evaluation

## 2024-05-20 NOTE — Op Note (Signed)
 [facility] Patient Name: David Callahan Procedure Date: 05/20/2024 1:49 PM MRN: [id number] Date of Birth: 22-Aug-1956 Attending MD: Lamar Ozell Hollingshead , MD, [id number] CSN: [id number] Age: 68 Admit Type: Inpatient Procedure:                Upper GI endoscopy Indications:              Melena; history of duodenal ulcer Providers:                Lamar Ozell Hollingshead, MD, Madelin Hunter, RN, Jon Loge Referring MD:              Medicines:                Propofol  per Anesthesia Complications:            Large duodenal bulbar ulcer with visible                            vessel/active bleeding. He has failed endoscopic                            bleeding control therapy.                           He needs to go to the operating room. Consult with                            Dr. Tobie you. She is kindly come to see the                            patient. Patient will be moved to the OR for                            surgical intervention. Estimated Blood Loss:     400 cc Procedure:                Pre-Anesthesia Assessment:                           - Prior to the procedure, a History and Physical                            was performed, and patient medications and                            allergies were reviewed. The patient's tolerance of                            previous anesthesia was also reviewed. The risks                            and benefits of the procedure and the sedation  options and risks were discussed with the patient.                            All questions were answered, and informed consent                            was obtained. Prior Anticoagulants: The patient has                            taken no anticoagulant or antiplatelet agents. ASA                            Grade Assessment: III - A patient with severe                            systemic disease. After reviewing the risks and                             benefits, the patient was deemed in satisfactory                            condition to undergo the procedure.                           After obtaining informed consent, the endoscope was                            passed under direct vision. Throughout the                            procedure, the patient's blood pressure, pulse, and                            oxygen  saturations were monitored continuously. The                            [id number]-H190 ([id number]) scope was introduced through the                            mouth, and advanced to the second part of duodenum.                            The upper GI endoscopy was accomplished without                            difficulty. The patient tolerated the procedure                            well. Scope In: Scope Out: 3:42:09 PM Findings:      The examined esophagus was normal. No blood in the stomach initially.      The entire examined stomach was normal. Pylorus patent. Examination bulb       second portion revealed a 3.5 cm cratered ulcer with a central pigmented  protuberance/visible vessel present.      I ring injected the ulcer crater with 6 cc of 1-10,000 epinephrine .       Subsequently, I loaded up a 16.5 mm of Vesco over the scope clip. I       approximated the clip over the visible vessel and suction mucosa into       the scope and deployed the a Vesco clip. This precipitated brisk       bleeding. The clip appeared to be adherent to the mucosa but there was       bleeding. Subsequently I put a total of 7 mantis clips in the area which       slow but did not stop the arterial bleeding. 2 units of blood given       additionally during the procedure. His vital signs remained stable. Impression:               - Normal esophagus.                           - Normal stomach.                           - Large duodenal ulcer with visible vessel?"active                            bleeding with attempted bleeding  control therapy. Moderate Sedation:      Moderate (conscious) sedation was personally administered by an       anesthesia professional. The following parameters were monitored: oxygen        saturation, heart rate, blood pressure, respiratory rate, EKG, adequacy       of pulmonary ventilation, and response to care. Recommendation:           Urgent surgery consultation for laparotomy. Unsafe                            to attempt transfer to another center for IR. Procedure Code(s):        --- Professional ---                           [phone], Esophagogastroduodenoscopy, flexible,                            transoral; diagnostic, including collection of                            specimen(s) by brushing or washing, when performed                            (separate procedure) Diagnosis Code(s):        --- Professional ---                           K92.1, Melena (includes Hematochezia) CPT copyright 2022 American Medical Association. All rights reserved. The codes documented in this report are preliminary and upon coder review may  be revised to meet current compliance requirements. Lamar HERO. Rinoa Garramone, MD Lamar Ozell Hollingshead, MD 05/20/2024 4:31:38 PM This report has been signed electronically. Number of Addenda: 0

## 2024-05-20 NOTE — Anesthesia Postprocedure Evaluation (Signed)
 Anesthesia Post Note  Patient: David Callahan  Procedure(s) Performed: LAPAROTOMY, EXPLORATORY, CONTROL OF BLEEDING DUODENAL ULCER (Abdomen)  Patient location during evaluation: PACU Anesthesia Type: General Level of consciousness: patient remains intubated per anesthesia plan Pain management: pain level controlled Vital Signs Assessment: post-procedure vital signs reviewed and stable Respiratory status: patient remains intubated per anesthesia plan Cardiovascular status: blood pressure returned to baseline and stable Postop Assessment: no apparent nausea or vomiting Anesthetic complications: no   No notable events documented.   Last Vitals:  Vitals:   05/20/24 1742 05/20/24 1743  BP:    Pulse:    Resp: (!) 0 (!) 0  Temp:    SpO2:      Last Pain:  Vitals:   05/20/24 1430  TempSrc:   PainSc: 10-Worst pain ever                 Andrea Limes

## 2024-05-20 NOTE — Transfer of Care (Signed)
 Immediate Anesthesia Transfer of Care Note  Patient: David Callahan  Procedure(s) Performed: LAPAROTOMY, EXPLORATORY, CONTROL OF BLEEDING DUODENAL ULCER (Abdomen)  Patient Location: PACU and ICU  Anesthesia Type:General  Level of Consciousness: Patient remains intubated per anesthesia plan  Airway & Oxygen  Therapy: Patient remains intubated per anesthesia plan  Post-op Assessment: Report given to RN and Post -op Vital signs reviewed and stable  Post vital signs: Reviewed and stable  Last Vitals:  Vitals Value Taken Time  BP    Temp    Pulse    Resp    SpO2      Last Pain:  Vitals:   05/20/24 1430  TempSrc:   PainSc: 10-Worst pain ever      Patients Stated Pain Goal: 0 (05/20/24 0500)  Complications: No notable events documented.

## 2024-05-20 NOTE — Progress Notes (Addendum)
 eLink Physician-Brief Progress Note Patient Name: David Callahan DOB: 1956/03/24 MRN: [id number]   Date of Service  05/20/2024  HPI/Events of Note  68 y/o gentleman with a history of COPD, PUD who presented with acute upper GIB to [facility] on 8/6 with hematemesis and bloody stools.  Developed hemorrhagic shock requiring ex lap for bleeding control status post omental patch for duodenotomy closure.  Vital signs are within normal limits.  The patient is saturating 100% on 40% FiO2 on the ventilator.  Currently on propofol  and fentanyl  infusions.  Results show adequate ventilation and oxygenation, elevated creatinine, leukocytosis and anemia.  EKG with accelerated junctional rhythm  eICU Interventions  Maintain prophylactic antibiotics  Continue scheduled SVNs, mechanical ventilation with daily spontaneous awakening/breathing trials.  PPI IV twice daily.  Discontinue concurrent famotidine   DVT prophylaxis with SCDs in the setting of active bleeding GI prophylaxis with pantoprazole    2345 -minimal hyperglycemia.  No indication for SSI.  Maintain blood sugars less than 180  Intervention Category Evaluation Type: New Patient Evaluation  [name] 05/20/2024, 7:45 PM

## 2024-05-20 NOTE — Progress Notes (Signed)
  Contacted by general surgery Dr. Pappayliou/GI team (Dr. Shaaron)   Patient was admitted for profuse anemia due to GI bleed, status post total of 7U PRBC transfusion.-CT angio revealed possible duodenal ulcer. Today patient was taken to EGD: He was discovered to have active bleeding ulcer within the duodenum with a visible vessel.  With all attempts was unable to control bleeding. Apparently intraoperatively patient lost approximately 1 to 1.5 L of blood. He was being transfused during the procedure.  Sequently general surgery Dr. Evonnie emergently consulted who will take the patient to the OR for emergency exploratory laparoscopy due to failed endoscopic control of duodenal bleed.    Dr. Evonnie requested patient to be transferred to ICU at [facility] where IR will be available for any emergency of rebleeding. Patient will remain intubated postoperatively per anesthesia.    Called PCCM through [facility] discussed the case with Dr. Ruther  Continue discussion via secure chat.   Dr. Shelah has accepted patient to be transferred to [facility].     SIGNED: Adriana DELENA Grams, MD, FHM. FAAFP [facility],  Pager (please use [email or web address] to page/text)  Please use Epic Secure Chat for non-urgent communication (7AM-7PM) If 7PM-7AM, please contact night-coverage [email or web address],  05/20/2024, 4:13 PM

## 2024-05-20 NOTE — Anesthesia Preprocedure Evaluation (Addendum)
 Anesthesia Evaluation  Patient identified by MRN, date of birth, ID band Patient awake    Reviewed: Allergy & Precautions, H&P , NPO status , Patient's Chart, lab work & pertinent test results  Airway        Dental   Pulmonary asthma , sleep apnea , pneumonia, COPD, Current Smoker and Patient abstained from smoking.          Cardiovascular hypertension, + Valvular Problems/Murmurs      Neuro/Psych negative neurological ROS  negative psych ROS   GI/Hepatic negative GI ROS, Neg liver ROS, PUD,,,  Endo/Other  negative endocrine ROS    Renal/GU Renal diseasenegative Renal ROS  negative genitourinary   Musculoskeletal negative musculoskeletal ROS (+)    Abdominal   Peds negative pediatric ROS (+)  Hematology negative hematology ROS (+) Blood dyscrasia, anemia   Anesthesia Other Findings   Reproductive/Obstetrics negative OB ROS                              Anesthesia Physical Anesthesia Plan  ASA: 4 and emergent  Anesthesia Plan: General   Post-op Pain Management:    Induction: Rapid sequence, Cricoid pressure planned and Intravenous  PONV Risk Score and Plan:   Airway Management Planned: Oral ETT  Additional Equipment: Arterial line  Intra-op Plan:   Post-operative Plan: Post-operative intubation/ventilation  Informed Consent: I have reviewed the patients History and Physical, chart, labs and discussed the procedure including the risks, benefits and alternatives for the proposed anesthesia with the patient or authorized representative who has indicated his/her understanding and acceptance.       Plan Discussed with:   Anesthesia Plan Comments:         Anesthesia Quick Evaluation

## 2024-05-20 NOTE — Transfer of Care (Signed)
 Immediate Anesthesia Transfer of Care Note  Patient: David Callahan  Procedure(s) Performed: EGD (ESOPHAGOGASTRODUODENOSCOPY)  Patient Location: To OR 3 from Endoscopy 3   Anesthesia Type:General  Level of Consciousness: sedated  Airway & Oxygen  Therapy: Patient remains intubated per anesthesia plan  Post-op Assessment: Post -op Vital signs reviewed and stable  Post vital signs: Reviewed and stable  Last Vitals:  Vitals Value Taken Time  BP    Temp    Pulse    Resp    SpO2      Last Pain:  Vitals:   05/20/24 1430  TempSrc:   PainSc: 10-Worst pain ever       Complications: No notable events documented.

## 2024-05-20 NOTE — H&P (Signed)
 NAME:  David Callahan, MRN:  [id number], DOB:  [birth year], LOS: 2 ADMISSION DATE:  05/18/2024, CONSULTATION DATE:  8/8 REFERRING MD:  Dr. Willette, CHIEF COMPLAINT:  duodenal bleeding ulcer post op   History of Present Illness:  Patient is a 68 yo M w/ pertinent PMH COPD, HTN, PUD presents to [facility] ED on 8/6 w/ GIB.  Patient presents to [facility] on 8/6 w/ hematemesis and black stools. SBP 90s. Hgb 5.3 requiring transfusion. Started on PPI BID. GI consulted.  9/7 CTA GIB showing focal outpouching in first portion of duodenum; could represent large ulcer or duodenum diverticulum. 8/8 EGD noted to have bleeding duodenal ulcer unable to control bleeding. Given PRBCs. Surgery consulted plan to take to OR. Will plan on keeping intubated post op and will need to transfer to [facility] ICU. PCCM consulted. Total of 12 units PRBC plus 1 FFP given.  Pertinent  Medical History   Past Medical History:  Diagnosis Date   Asthma    COPD (chronic obstructive pulmonary disease) (HCC)    Heart murmur    Hypertension    Sleep apnea      Significant Hospital Events: Including procedures, antibiotic start and stop dates in addition to other pertinent events   8/6 admit to [facility] gib 8/8 persistent bleeding duodenal ulcer taking to OR. Remained intubated post-operatively and he was transferred to [facility] for ICU care.   Interim History / Subjective:    Objective    Blood pressure (!) 127/57, pulse 66, temperature 97.9 F (36.6 C), temperature source Oral, resp. rate 17, height 5' 10 (1.778 m), weight 79.4 kg, SpO2 100%.        Intake/Output Summary (Last 24 hours) at 05/20/2024 1622 Last data filed at 05/20/2024 1606 Gross per 24 hour  Intake 962.83 ml  Output 3200 ml  Net -2237.17 ml   Filed Weights   05/18/24 0751 05/20/24 1342  Weight: 79.4 kg 79.4 kg    Examination: General: adult male in NAD on vent HENT: [place]/AT, PERRL, no JVD Lungs: Clear bilateral breath sounds Cardiovascular: RRR, no  MRG Abdomen: Concave. Longitudinal midline incision with stables and clear adhesive closure device. Incision and staple line look good.  Extremities: No acute deformity Neuro: Sedated RASS -4 GU: Foley  Resolved problem list   Assessment and Plan   Bleeding duodenal ulcer post op repair: has received 12 units PRBC and 2 FFP best I can tell.  Plan: -transfer to [facility] -was seen by [facility] surgical associates and [facility] at [facility]. Consult GI (unassigned in [place]) and [facility] in AM. -NPO -NGT to low intermittent suction -Perioperative Ancef  -PPI bid -trend cbc; transfuse for hgb <7 -SCDs for dvt ppx -CBC now and in AM  Endotracheal tube present post op COPD without exacerbation Plan: -cxr on arrival -LTVV strategy with tidal volumes of 6-8 cc/kg ideal body weight -check ABG and adjust settings accordingly  -Wean PEEP/FiO2 for SpO2 >92% -VAP bundle in place -Daily SAT and SBT -PAD protocol in place -wean sedation for RASS goal 0 to -1 -Duonebs in place of home Incruse   AKI Plan: -Trend BMP / urinary output -Replace electrolytes as indicated -Avoid nephrotoxic agents, ensure adequate renal perfusion  Leukocytosis Plan: -likely reactive -trend cbc  HTN HLD Plan: -hold home meds for now -statin when able to take po   Best Practice (right click and Reselect all SmartList Selections daily)   Diet/type: NPO DVT prophylaxis SCD Pressure ulcer(s): pressure ulcer assessment deferred  GI prophylaxis:  PPI Lines: N/A Foley:  Yes, and it is still needed Code Status:  full code Last date of multidisciplinary goals of care discussion [Tried to call daughter, no answer and voicemail is full]  Labs   CBC: Recent Labs  Lab 05/18/24 0447 05/18/24 1151 05/18/24 1317 05/18/24 1727 05/18/24 2357 05/19/24 0640 05/19/24 0744 05/19/24 1547 05/20/24 0412 05/20/24 1248  WBC 16.4* 11.8*  --  12.3* 13.8* 16.5*  --  18.3*  --   --   NEUTROABS 14.2*  --    --   --   --   --   --   --   --   --   HGB 5.3* 6.2*   < > 6.7* 6.6* 6.6* 6.9* 9.7* 7.3* 7.2*  HCT 16.8* 19.1*   < > 21.0* 20.8* 19.6* 20.3* 28.5* 21.8* 22.6*  MCV 94.9 94.1  --  92.5 90.8 90.7  --  89.1  --   --   PLT 342 274  --  273 267 228  --  227  --   --    < > = values in this interval not displayed.    Basic Metabolic Panel: Recent Labs  Lab 05/18/24 0447 05/19/24 0640 05/20/24 0412  NA 142 138 137  K 5.1 4.7 4.4  CL 110 108 102  CO2 22 23 26   GLUCOSE 118* 126* 109*  BUN 52* 46* 52*  CREATININE 1.36* 1.26* 1.45*  CALCIUM  8.2* 7.4* 7.9*   GFR: Estimated Creatinine Clearance: 51 mL/min (A) (by C-G formula based on SCr of 1.45 mg/dL (H)). Recent Labs  Lab 05/18/24 1727 05/18/24 2357 05/19/24 0640 05/19/24 1547  WBC 12.3* 13.8* 16.5* 18.3*    Liver Function Tests: Recent Labs  Lab 05/18/24 0447  AST 25  ALT 20  ALKPHOS 68  BILITOT 0.4  PROT 5.0*  ALBUMIN 2.4*   Recent Labs  Lab 05/18/24 0447  LIPASE 26   No results for input(s): AMMONIA in the last 168 hours.  ABG    Component Value Date/Time   HCO3 31.6 (H) 06/04/2022 2032   O2SAT 30.9 06/04/2022 2032     Coagulation Profile: Recent Labs  Lab 05/18/24 0447  INR 1.2    Cardiac Enzymes: No results for input(s): CKTOTAL, CKMB, CKMBINDEX, TROPONINI in the last 168 hours.  HbA1C: Hgb A1c MFr Bld  Date/Time Value Ref Range Status  06/05/2022 05:03 AM 5.4 4.8 - 5.6 % Final    Comment:    (NOTE) Pre diabetes:          5.7%-6.4%  Diabetes:              >6.4%  Glycemic control for   <7.0% adults with diabetes     CBG: Recent Labs  Lab 05/19/24 0503  GLUCAP 110*    Review of Systems:   Patient intubated; therefore, history has been obtained from chart review.    Past Medical History:  He,  has a past medical history of Asthma, COPD (chronic obstructive pulmonary disease) (HCC), Heart murmur, Hypertension, and Sleep apnea.   Surgical History:   Past Surgical  History:  Procedure Laterality Date   COLONOSCOPY     ESOPHAGOGASTRODUODENOSCOPY N/A 03/10/2024   Procedure: EGD (ESOPHAGOGASTRODUODENOSCOPY);  Surgeon: Cinderella Deatrice FALCON, MD;  Location: [facility] ENDO SUITE;  Service: Endoscopy;  Laterality: N/A;   ESOPHAGOGASTRODUODENOSCOPY N/A 04/06/2024   Procedure: EGD (ESOPHAGOGASTRODUODENOSCOPY);  Surgeon: Shaaron Lamar HERO, MD;  Location: [facility] ENDO SUITE;  Service: Endoscopy;  Laterality: N/A;   JOINT REPLACEMENT  Left    knee   KNEE SURGERY       Social History:   reports that he has been smoking cigarettes and cigars. He has never used smokeless tobacco. He reports that he does not currently use alcohol. He reports that he does not use drugs.   Family History:  His family history is not on file.   Allergies Allergies  Allergen Reactions   Motrin [Ibuprofen] Anaphylaxis and Swelling    Anti-inflammatory analgesics- cause anaphylaxis     Home Medications  Prior to Admission medications   Medication Sig Start Date End Date Taking? Authorizing Provider  albuterol  (VENTOLIN  HFA) 108 (90 Base) MCG/ACT inhaler Inhale 2 puffs into the lungs every 6 (six) hours as needed for shortness of breath.   Yes [provider]  atorvastatin  (LIPITOR) 40 MG tablet Take 40 mg by mouth daily.   Yes [provider]  benazepril  (LOTENSIN ) 40 MG tablet Take 40 mg by mouth daily. 04/27/24  Yes [provider]  cyclobenzaprine  (FLEXERIL ) 10 MG tablet Take 10 mg by mouth at bedtime. 09/17/20  Yes [provider]  diltiazem  (CARDIZEM  CD) 360 MG 24 hr capsule Take 360 mg by mouth daily. 02/16/24  Yes [provider]  esomeprazole  (NEXIUM ) 40 MG capsule Take 1 capsule (40 mg total) by mouth 2 (two) times daily before a meal. 05/08/24 05/08/25 Yes Dorrell, Lamar, MD  feeding supplement, ENSURE COMPLETE, (ENSURE COMPLETE) LIQD Take 237 mLs by mouth 2 (two) times daily between meals. 03/12/24  Yes Tat, Alm, MD  ferrous sulfate  325 (65 FE) MG  tablet Take 325 mg by mouth daily with breakfast.   Yes [provider]  HYDROcodone -acetaminophen  (NORCO) 10-325 MG tablet Take 1 tablet by mouth every 4 (four) hours as needed for moderate pain (pain score 4-6). 02/10/24  Yes [provider]  rOPINIRole  (REQUIP ) 1 MG tablet Take 1 mg by mouth daily. 02/16/24  Yes [provider]  sucralfate  (CARAFATE ) 1 GM/10ML suspension Take 10 mLs (1 g total) by mouth 4 (four) times daily -  with meals and at bedtime for 28 days. 04/07/24 05/18/24 Yes Shah, Pratik D, DO  terazosin  (HYTRIN ) 10 MG capsule Take 10 mg by mouth daily. 02/16/24  Yes [provider]  traZODone  (DESYREL ) 150 MG tablet Take 150 mg by mouth at bedtime as needed for sleep. 02/16/24  Yes [provider]  umeclidinium bromide  (INCRUSE ELLIPTA ) 62.5 MCG/ACT AEPB Inhale 1 puff into the lungs daily. 06/23/22  Yes Bryn Bernardino NOVAK, MD     Critical care time: 51 minutes      Deward Eastern, AGACNP-BC [facility]  See Amion for personal pager PCCM on call pager [phone] until 7pm. Please call Elink 7p-7a. [phone]  05/20/2024 8:25 PM

## 2024-05-20 NOTE — Progress Notes (Addendum)
 1915 hours: Hand-off received from Marval Coder, RN regarding this patient coming from [facility] to [id number]. Patient has not yet arrived to MICU.  1934 hours: Patient arrived to [id number]; handoff received from [facility] RN. Dr. Gretta and Deward, NP at beside.

## 2024-05-20 NOTE — Anesthesia Procedure Notes (Signed)
 Procedure Name: Intubation Date/Time: 05/20/2024 3:30 PM  Performed by: Jullie Redell SAUNDERS, CRNAPre-anesthesia Checklist: Patient identified, Emergency Drugs available, Suction available, Patient being monitored and Timeout performed Patient Re-evaluated:Patient Re-evaluated prior to induction Oxygen  Delivery Method: Circle system utilized Preoxygenation: Pre-oxygenation with 100% oxygen  Induction Type: IV induction, Rapid sequence and Cricoid Pressure applied Laryngoscope Size: Mac and 4 Grade View: Grade I Tube type: Oral Number of attempts: 1 Airway Equipment and Method: Stylet Placement Confirmation: ETT inserted through vocal cords under direct vision, positive ETCO2, breath sounds checked- equal and bilateral and CO2 detector Secured at: 22 cm Tube secured with: Tape Dental Injury: Teeth and Oropharynx as per pre-operative assessment

## 2024-05-20 NOTE — Progress Notes (Signed)
 [facility]  Unable to update the patient postoperatively, as he was intubated and sedated.  I called his daughter, but she did not answer the phone.  Patient received 5 units of PRBCs and 1 of platelets in Endo suite and OR.  Plan: -Patient transferred directly from the OR to [facility] ICU. -Plans for patient to be transferred down to [facility] ICU for resuscitation and in the event he rebleeds and needs IR intervention -Chest x-ray to confirm ET tube placement -NG to LIS -Stat H/H -Recommend DIC panel/Teg to evaluate for further transfusion needs -Vent management per critical care -JP drain care -Appreciate hospitalist and critical care recommendations   Dorothyann Brittle, DO [facility] [address] [place], [place] [address] [phone] (office)

## 2024-05-20 NOTE — Progress Notes (Signed)
 PROGRESS NOTE    Patient: David Callahan                            PCP: [name], NP                    DOB: [birth year]            DOA: 05/18/2024 FMW:[id number]             DOS: 05/20/2024, 11:56 AM   LOS: 2 days   Date of Service: The patient was seen and examined on 05/20/2024  Subjective:   The patient was seen and examined this morning, stable no acute distress Thus far received 5 units of PRBC transfusion 2 units yesterday tolerated Hemodynamically stable. Hemoglobin 6.9--9.7-dropped back to 7.3 this a.m. He reports of no bowel movement no active rectal bleeding no bloody hematemesis    Brief Narrative:   David Callahan is a 67 y.o. male with medical history significant for COPD, hypertension, and peptic ulcer disease who presents with hematemesis, black stools, and exertional dyspnea.   Patient reports that he was feeling well during the day yesterday but developed nausea and mild epigastric pain last night and vomited blood at approximately 11 PM.  He notes that his stool has been black for weeks but attributes this to his iron  supplementation.   He reports complete avoidance of NSAIDs and strict adherence with twice-daily PPI.    ED Course:  Normal heart rate and SBP in the 90s and greater.  Labs are most notable for BUN 52, creatinine 1.36, albumin 2.4, WBC 16,400, and hemoglobin 5.3.   ED physician sent a secure chat to GI (Dr. Eartha) and the patient was treated with 1 L NS, 80 mg IV Protonix , and Zofran .  2 units RBC were ordered for transfusion.       Assessment & Plan:   Principal Problem:   Acute upper GI bleed Active Problems:   COPD (chronic obstructive pulmonary disease) (HCC)   AKI (acute kidney injury) (HCC)   Essential hypertension   Assessment/Plan    Acute upper GI bleed; symptomatic anemia  - GI consulted-following closely -Anticipating EGD today -S/p total of 5 units of PRBC so far hemoglobin 6.9, 9.7, dropped back to 7.3 this  a.m. -Type and screening for another 2 units of PRBC today 05/20/2024  -CT abdomen reviewed, discussed with GI,  outpouching within the first portion of the duodenum (axial 26), measuring 1.9 cm. This could represent a large ulcer or duodenum diverticulum.  -Continue IV Protonix  40 twice daily  - Continue bowel rest and IV PPI, proceed with transfusion of 2 units RBCs, follow serial CBCs   -Monitoring H&H following, for any further blood transfusion     Latest Ref Rng & Units 05/20/2024    4:12 AM 05/19/2024    3:47 PM 05/19/2024    7:44 AM  CBC  WBC 4.0 - 10.5 K/uL  18.3    Hemoglobin 13.0 - 17.0 g/dL 7.3  9.7  6.9   Hematocrit 39.0 - 52.0 % 21.8  28.5  20.3   Platelets 150 - 400 K/uL  227        Acute respiratory distress -in the setting of volume overload, COPD Resolved -on room air satting 100% - Likely was due to volume overload with blood transfusion IVF - As needed IV Lasix  (and between blood transfusions - Nebs as needed  leukocytosis  -  Signs of infection likely reactive - Trend closely afebrile,   AKI  - Likely prerenal in setting of GIB with severe anemia  -Avoiding hypotension - Hold ACE-i, transfuse RBCs, continue IVF hydration, repeat chem panel in am   Lab Results  Component Value Date   CREATININE 1.45 (H) 05/20/2024   CREATININE 1.26 (H) 05/19/2024   CREATININE 1.36 (H) 05/18/2024     Hypertension -  POA hypotensive, SBP in 90s -Exacerbated by hypovolemia, - Blood pressure stabilized now hypertensive - Will utilize as needed IV hydralazine , IV Lasix , holding BP meds     COPD  - With mild exacerbation likely due to volume overload -Continue oxygen  supplements currently satting 100% on 2 L - Continue Incruse Ellipta  and as-needed short-acting bronchodilators       -------------------------------------------------------------------------------------------------------------------------------------------- Nutritional status:  The patient's BMI is:  Body mass index is 25.12 kg/m. I agree with the assessment and plan as outlined  Nutrition Status:     DVT prophylaxis:  SCDs Start: 05/18/24 0605   Code Status:   Code Status: Full Code  Family Communication: No family member present at bedside-  -Advance care planning has been discussed.   Admission status:   Status is: Inpatient Remains inpatient appropriate because: Blood transfusion, GI evaluation for acute GI bleed   Disposition: From  - home             Planning for discharge in 1-2 days   Procedures:   No admission procedures for hospital encounter.   Antimicrobials:  Anti-infectives (From admission, onward)    None        Medication:   sodium chloride    Intravenous Once   sodium chloride    Intravenous Once   atorvastatin   40 mg Oral Daily   Chlorhexidine  Gluconate Cloth  6 each Topical Q0600   cyclobenzaprine   10 mg Oral QHS   pantoprazole  (PROTONIX ) IV  40 mg Intravenous Q12H   rOPINIRole   1 mg Oral Daily   sodium chloride  flush  3 mL Intravenous Q12H   umeclidinium bromide   1 puff Inhalation Daily    acetaminophen  **OR** acetaminophen , fentaNYL  (SUBLIMAZE ) injection, ipratropium-albuterol , oxyCODONE , prochlorperazine , sodium chloride , traZODone    Objective:   Vitals:   05/20/24 1000 05/20/24 1055 05/20/24 1100 05/20/24 1119  BP: 105/60  (!) 139/54 (!) (P) 120/47  Pulse:   66   Resp: 12  16   Temp:  97.9 F (36.6 C) 97.9 F (36.6 C) (P) 97.7 F (36.5 C)  TempSrc:  Oral Oral (P) Oral  SpO2:   100%   Weight:      Height:        Intake/Output Summary (Last 24 hours) at 05/20/2024 1156 Last data filed at 05/20/2024 1038 Gross per 24 hour  Intake 336 ml  Output 3550 ml  Net -3214 ml   Filed Weights   05/18/24 0751  Weight: 79.4 kg     Physical examination:     General:  AAO x 3,  cooperative, no distress;   HEENT:  Normocephalic, PERRL, otherwise with in Normal limits   Neuro:  CNII-XII intact. , normal motor and sensation,  reflexes intact   Lungs:   Clear to auscultation BL, Respirations unlabored,  No wheezes / crackles  Cardio:    S1/S2, RRR, No murmure, No Rubs or Gallops   Abdomen:  Soft, non-tender, bowel sounds active all four quadrants, no guarding or peritoneal signs.  Muscular  skeletal:  Limited exam -global generalized weaknesses - in bed, able to move  all 4 extremities,   2+ pulses,  symmetric, No pitting edema  Skin:  Dry, warm to touch, negative for any Rashes,  Wounds: Please see nursing documentation      LABs:     Latest Ref Rng & Units 05/20/2024    4:12 AM 05/19/2024    3:47 PM 05/19/2024    7:44 AM  CBC  WBC 4.0 - 10.5 K/uL  18.3    Hemoglobin 13.0 - 17.0 g/dL 7.3  9.7  6.9   Hematocrit 39.0 - 52.0 % 21.8  28.5  20.3   Platelets 150 - 400 K/uL  227        Latest Ref Rng & Units 05/20/2024    4:12 AM 05/19/2024    6:40 AM 05/18/2024    4:47 AM  CMP  Glucose 70 - 99 mg/dL 890  873  881   BUN 8 - 23 mg/dL 52  46  52   Creatinine 0.61 - 1.24 mg/dL 8.54  8.73  8.63   Sodium 135 - 145 mmol/L 137  138  142   Potassium 3.5 - 5.1 mmol/L 4.4  4.7  5.1   Chloride 98 - 111 mmol/L 102  108  110   CO2 22 - 32 mmol/L 26  23  22    Calcium  8.9 - 10.3 mg/dL 7.9  7.4  8.2   Total Protein 6.5 - 8.1 g/dL   5.0   Total Bilirubin 0.0 - 1.2 mg/dL   0.4   Alkaline Phos 38 - 126 U/L   68   AST 15 - 41 U/L   25   ALT 0 - 44 U/L   20        Micro Results Recent Results (from the past 240 hours)  MRSA Next Gen by PCR, Nasal     Status: None   Collection Time: 05/18/24  7:50 AM   Specimen: Nasal Mucosa; Nasal Swab  Result Value Ref Range Status   MRSA by PCR Next Gen NOT DETECTED NOT DETECTED Final    Comment: (NOTE) The GeneXpert MRSA Assay (FDA approved for NASAL specimens only), is one component of a comprehensive MRSA colonization surveillance program. It is not intended to diagnose MRSA infection nor to guide or monitor treatment for MRSA infections. Test performance is not FDA approved in  patients less than 69 years old. Performed at [facility], [address], [place], [place] [address]     Radiology Reports No results found.   SIGNED: Adriana DELENA Grams, MD, FHM. FAAFP. David Callahan - [facility] Attending to monitor and ICU setting for acute symptomatic anemia, with hypotension, and close monitoring  Critical care time spent - 55 min.  In seeing, evaluating and examining the patient. Reviewing medical records, labs, drawn plan of care. [facility],  Pager (please use [email or web address] to page/ text) Please use Epic Secure Chat for non-urgent communication (7AM-7PM)  If 7PM-7AM, please contact night-coverage [email or web address], 05/20/2024, 11:56 AM

## 2024-05-20 NOTE — Consult Note (Signed)
 [facility] Consult  Reason for Consult: Duodenal ulcer bleed Referring Physician: Dr. Shaaron  Chief Complaint   Hematemesis     HPI: David Callahan is a 68 y.o. male who was admitted with an acute upper GI bleed.  He underwent a CTA which demonstrated concern for an ulcer within the duodenum in close proximity to the GDA.  He underwent EGD today, and was noted to have an ulcer within the duodenum with a visible vessel that started spurting.  Attempts were made to control this vessel with EGD, however bleeding was unable to be controlled.  He lost 1 to 1.5 L of blood during the procedure.  He is currently receiving his 4th and 5th units of PRBCs while during the procedure.  His preprocedure hemoglobin was 7.3.  Patient is currently intubated and sedated.  Past Medical History:  Diagnosis Date   Asthma    COPD (chronic obstructive pulmonary disease) (HCC)    Heart murmur    Hypertension    Sleep apnea     Past Surgical History:  Procedure Laterality Date   COLONOSCOPY     ESOPHAGOGASTRODUODENOSCOPY N/A 03/10/2024   Procedure: EGD (ESOPHAGOGASTRODUODENOSCOPY);  Surgeon: Cinderella Deatrice FALCON, MD;  Location: [facility];  Service: Endoscopy;  Laterality: N/A;   ESOPHAGOGASTRODUODENOSCOPY N/A 04/06/2024   Procedure: EGD (ESOPHAGOGASTRODUODENOSCOPY);  Surgeon: Shaaron Lamar HERO, MD;  Location: [facility];  Service: Endoscopy;  Laterality: N/A;   JOINT REPLACEMENT Left    knee   KNEE SURGERY      History reviewed. No pertinent family history.  Social History   Tobacco Use   Smoking status: Every Day    Current packs/day: 0.50    Types: Cigarettes, Cigars   Smokeless tobacco: Never  Vaping Use   Vaping status: Never Used  Substance Use Topics   Alcohol use: Not Currently    Comment: ocassional homemade moonshine per pt   Drug use: Never    Medications: I have reviewed the patient's current medications.  Allergies  Allergen Reactions   Motrin [Ibuprofen]  Anaphylaxis and Swelling    Anti-inflammatory analgesics- cause anaphylaxis     ROS:  Unable to obtain secondary to patient being intubated and sedated  Blood pressure (!) 127/57, pulse 66, temperature 97.9 F (36.6 C), temperature source Oral, resp. rate 17, height 5' 10 (1.778 m), weight 79.4 kg, SpO2 100%. Physical Exam Vitals reviewed.  Constitutional:      Comments: Intubated and sedated  Cardiovascular:     Rate and Rhythm: Normal rate.  Pulmonary:     Effort: Pulmonary effort is normal.  Abdominal:     General: There is no distension.     Palpations: Abdomen is soft.     Tenderness: There is no abdominal tenderness.  Skin:    General: Skin is warm and dry.     Results: Results for orders placed or performed during the hospital encounter of 05/18/24 (from the past 48 hours)  CBC     Status: Abnormal   Collection Time: 05/18/24  5:27 PM  Result Value Ref Range   WBC 12.3 (H) 4.0 - 10.5 K/uL   RBC 2.27 (L) 4.22 - 5.81 MIL/uL   Hemoglobin 6.7 (LL) 13.0 - 17.0 g/dL    Comment: REPEATED TO VERIFY This result has been called to [facility] by Mitzie Buck on 05/18/2024 17:51:59, and has been read back.    HCT 21.0 (L) 39.0 - 52.0 %   MCV 92.5 80.0 - 100.0 fL  MCH 29.5 26.0 - 34.0 pg   MCHC 31.9 30.0 - 36.0 g/dL   RDW 82.0 (H) 88.4 - 84.4 %   Platelets 273 150 - 400 K/uL   nRBC 0.0 0.0 - 0.2 %    Comment: Performed at [facility], [address] [place], [place] [address]  CBC     Status: Abnormal   Collection Time: 05/18/24 11:57 PM  Result Value Ref Range   WBC 13.8 (H) 4.0 - 10.5 K/uL   RBC 2.29 (L) 4.22 - 5.81 MIL/uL   Hemoglobin 6.6 (LL) 13.0 - 17.0 g/dL    Comment: REPEATED TO VERIFY This result has been called to [facility] by [id number] on 05/19/2024 00:33:18, and has been read back.    HCT 20.8 (L) 39.0 - 52.0 %   MCV 90.8 80.0 - 100.0 fL   MCH 28.8 26.0 - 34.0 pg   MCHC 31.7 30.0 - 36.0 g/dL   RDW 81.2 (H) 88.4 - 84.4 %   Platelets 267 150 -  400 K/uL   nRBC 0.0 0.0 - 0.2 %    Comment: Performed at [facility], [address], [place], [place] [address]  Prepare RBC (crossmatch)     Status: None   Collection Time: 05/19/24 12:30 AM  Result Value Ref Range   Order Confirmation      ORDER PROCESSED BY BLOOD BANK Performed at [facility], [address], [place], [place] [address]   Glucose, capillary     Status: Abnormal   Collection Time: 05/19/24  5:03 AM  Result Value Ref Range   Glucose-Capillary 110 (H) 70 - 99 mg/dL    Comment: Glucose reference range applies only to samples taken after fasting for at least 8 hours.  Basic metabolic panel with GFR     Status: Abnormal   Collection Time: 05/19/24  6:40 AM  Result Value Ref Range   Sodium 138 135 - 145 mmol/L   Potassium 4.7 3.5 - 5.1 mmol/L   Chloride 108 98 - 111 mmol/L   CO2 23 22 - 32 mmol/L   Glucose, Bld 126 (H) 70 - 99 mg/dL    Comment: Glucose reference range applies only to samples taken after fasting for at least 8 hours.   BUN 46 (H) 8 - 23 mg/dL   Creatinine, Ser 8.73 (H) 0.61 - 1.24 mg/dL   Calcium  7.4 (L) 8.9 - 10.3 mg/dL   GFR, Estimated >39 >39 mL/min    Comment: (NOTE) Calculated using the CKD-EPI Creatinine Equation (2021)    Anion gap 7 5 - 15    Comment: Performed at [facility], [address] [place], [place] [address]  CBC     Status: Abnormal   Collection Time: 05/19/24  6:40 AM  Result Value Ref Range   WBC 16.5 (H) 4.0 - 10.5 K/uL   RBC 2.16 (L) 4.22 - 5.81 MIL/uL   Hemoglobin 6.6 (LL) 13.0 - 17.0 g/dL    Comment: REPEATED TO VERIFY This result has been called to K.LORENZO by [id number] on 05/19/2024 06:53:17, and has been read back.    HCT 19.6 (L) 39.0 - 52.0 %   MCV 90.7 80.0 - 100.0 fL   MCH 30.6 26.0 - 34.0 pg   MCHC 33.7 30.0 - 36.0 g/dL   RDW 82.1 (H) 88.4 - 84.4 %   Platelets 228 150 - 400 K/uL   nRBC 0.2 0.0 - 0.2 %    Comment: Performed at [facility], [address] [place],  [place] [address]  Hemoglobin and  hematocrit, blood     Status: Abnormal   Collection Time: 05/19/24  7:44 AM  Result Value Ref Range   Hemoglobin 6.9 (LL) 13.0 - 17.0 g/dL    Comment: REPEATED TO VERIFY POST TRANSFUSION SPECIMEN This result has been called to CATELYN ROGERS by Mitzie Buck on 05/19/2024 08:04:34, and has been read back.    HCT 20.3 (L) 39.0 - 52.0 %    Comment: Performed at [facility], [address] [place], [place] [address]  Prepare RBC (crossmatch)     Status: None   Collection Time: 05/19/24  7:44 AM  Result Value Ref Range   Order Confirmation      ORDERS RECEIVED TO CROSSMATCH Performed at [facility], [address], [place], [place] [address]   CBC     Status: Abnormal   Collection Time: 05/19/24  3:47 PM  Result Value Ref Range   WBC 18.3 (H) 4.0 - 10.5 K/uL   RBC 3.20 (L) 4.22 - 5.81 MIL/uL   Hemoglobin 9.7 (L) 13.0 - 17.0 g/dL    Comment: REPEATED TO VERIFY POST TRANSFUSION SPECIMEN DELTA CHECK NOTED    HCT 28.5 (L) 39.0 - 52.0 %   MCV 89.1 80.0 - 100.0 fL   MCH 30.3 26.0 - 34.0 pg   MCHC 34.0 30.0 - 36.0 g/dL   RDW 82.9 (H) 88.4 - 84.4 %   Platelets 227 150 - 400 K/uL   nRBC 0.4 (H) 0.0 - 0.2 %    Comment: Performed at [facility], [address] [place], [place] [address]  Basic metabolic panel     Status: Abnormal   Collection Time: 05/20/24  4:12 AM  Result Value Ref Range   Sodium 137 135 - 145 mmol/L   Potassium 4.4 3.5 - 5.1 mmol/L   Chloride 102 98 - 111 mmol/L   CO2 26 22 - 32 mmol/L   Glucose, Bld 109 (H) 70 - 99 mg/dL    Comment: Glucose reference range applies only to samples taken after fasting for at least 8 hours.   BUN 52 (H) 8 - 23 mg/dL   Creatinine, Ser 8.54 (H) 0.61 - 1.24 mg/dL   Calcium  7.9 (L) 8.9 - 10.3 mg/dL   GFR, Estimated 53 (L) >60 mL/min    Comment: (NOTE) Calculated using the CKD-EPI Creatinine Equation (2021)    Anion gap 9 5 - 15    Comment: Performed at [facility], [address], [place], [place] [address]  Hemoglobin and  hematocrit, blood     Status: Abnormal   Collection Time: 05/20/24  4:12 AM  Result Value Ref Range   Hemoglobin 7.3 (L) 13.0 - 17.0 g/dL   HCT 78.1 (L) 60.9 - 47.9 %    Comment: Performed at [facility], [address], [place], [place] [address]  Prepare RBC (crossmatch)     Status: None   Collection Time: 05/20/24  8:16 AM  Result Value Ref Range   Order Confirmation      ORDER PROCESSED BY BLOOD BANK Performed at [facility], [address], [place], [place] [address]   Hemoglobin and hematocrit, blood     Status: Abnormal   Collection Time: 05/20/24 12:48 PM  Result Value Ref Range   Hemoglobin 7.2 (L) 13.0 - 17.0 g/dL   HCT 77.3 (L) 60.9 - 47.9 %    Comment: Performed at [facility], [address] [place], [place] [address]  Prepare RBC (crossmatch)  Status: None   Collection Time: 05/20/24  2:50 PM  Result Value Ref Range   Order Confirmation      ORDER PROCESSED BY BLOOD BANK Performed at [facility], [address] [place], [place] [address]   Prepare RBC (crossmatch)     Status: None   Collection Time: 05/20/24  3:15 PM  Result Value Ref Range   Order Confirmation      ORDER PROCESSED BY BLOOD BANK Performed at [facility], [address], [place], [place] [address]   Prepare fresh frozen plasma     Status: None (Preliminary result)   Collection Time: 05/20/24  3:15 PM  Result Value Ref Range   Unit Number [id number]    Blood Component Type THAWED PLASMA    Unit division 00    Status of Unit ALLOCATED    Transfusion Status OK TO TRANSFUSE   Prepare RBC     Status: None   Collection Time: 05/20/24  3:18 PM  Result Value Ref Range   Order Confirmation      ORDER PROCESSED BY BLOOD BANK Performed at [facility], [address], [place], [place] [address]     CT ANGIO GI BLEED Result Date: 05/19/2024 CLINICAL DATA:  Severe anemia refractory to transfusion. History of Melena. EXAM: CTA ABDOMEN AND PELVIS WITHOUT AND WITH CONTRAST TECHNIQUE: Initially,  noncontrast CT images of the abdomen and pelvis were performed. Subsequently, Multidetector CT imaging of the abdomen and pelvis was performed using the standard protocol during bolus administration of intravenous contrast. Multiplanar reconstructed images and MIPs were obtained and reviewed to evaluate the vascular anatomy. RADIATION DOSE REDUCTION: This exam was performed according to the departmental dose-optimization program which includes automated exposure control, adjustment of the mA and/or kV according to patient size and/or use of iterative reconstruction technique. CONTRAST:  OMNIPAQUE  IOHEXOL  350 MG/ML SOLN COMPARISON:  April 06, 2024 FINDINGS: The study is significantly degraded by beam hardening artifact from the patient's arm positioning. VASCULAR Aorta: No intramural hematoma or aortic dissection. Fusiform infrarenal aortic aneurysm measuring 3.3 cm. Diffuse calcified and noncalcified atherosclerosis throughout the abdominal aorta. No hemodynamically significant stenosis. Celiac: Patent without acute thrombus, aneurysm, or dissection.No hemodynamically significant stenosis. SMA: Patent without acute thrombus, aneurysm, or dissection.No hemodynamically significant stenosis. Renals: Patent without acute thrombus, aneurysm, or dissection.Mild stenosis of both renal artery ostia from calcified plaque. IMA: Patent without acute thrombus, aneurysm, or dissection.No hemodynamically significant stenosis. Inflow: Patent without acute thrombus, aneurysm, or dissection.Diffuse calcified atherosclerosis throughout the inflow vessels without hemodynamically significant stenosis. Proximal Outflow: The bilateral common femoral and visualized portions of the superficial and profunda femoral arteries are patent without acute thrombus, aneurysm, or dissection.No hemodynamically significant stenosis. Veins: No acute abnormality within the opacified veins. Review of the MIP images confirms the above findings.  NON-VASCULAR Lower chest: No focal airspace consolidation or pleural effusion.Small pericardial effusion. Multifocal right coronary artery calcified atherosclerosis. Hepatobiliary: No mass.Decompressed gallbladder without radiopaque stones or wall thickening.No intrahepatic or extrahepatic biliary ductal dilation. Pancreas: No mass or main ductal dilation.No peripancreatic inflammation or fluid collection. Spleen: Normal size. No mass. Adrenals/Urinary Tract: No adrenal masses. A couple of subcentimeter hypodensities are noted in the kidneys, too small to definitively characterize, but likely small cysts. No hydronephrosis or nephrolithiasis. The urinary bladder is distended without focal abnormality. Stomach/Bowel: The stomach is decompressed without focal abnormality. There is a fluid-filled outpouching along the first portion of the duodenum measuring 1.9 cm (axial 26), which comes in close approximation with the GDA (  axial 64-68). No small bowel wall thickening or inflammation. No small bowel obstruction.Normal appendix. GI Bleed: No extravasation of contrast to suggest active GI bleeding. Lymphatic: No intraabdominal or pelvic lymphadenopathy. Reproductive: Mild prostatomegaly.No free pelvic fluid. Other: No pneumoperitoneum, ascites, or mesenteric inflammation. Musculoskeletal: No acute fracture or destructive lesion.Osteopenia. Multilevel degenerative disc disease of the spine. 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 IMPRESSION: VASCULAR Fusiform infrarenal aortic aneurysm measuring 3.3 cm. No aortic dissection or intramural hematoma. NON-VASCULAR Focal outpouching within the first portion of the duodenum (axial 26), measuring 1.9 cm. This could represent a large ulcer or duodenum diverticulum. Of note, the GDA comes in close approximation with the wall and possibly the mucosal surface (coronal 40), and could represent a source  of occult GI bleeding. No findings of active bleeding, at this time. Nonemergent upper endoscopy should be considered. These results will be called to the ordering clinician or representative by the Radiologist Assistant and communication documented in the PACS or [employer]. Aortic Atherosclerosis (ICD10-I70.0). Aortic aneurysm NOS (ICD10-I71.9). Electronically Signed   By: Rogelia Myers M.D.   On: 05/19/2024 14:49   DG CHEST PORT 1 VIEW Result Date: 05/19/2024 CLINICAL DATA:  Acute respiratory failure with hypoxia. EXAM: PORTABLE CHEST 1 VIEW COMPARISON:  04/05/2024 FINDINGS: The lungs are clear without focal pneumonia, edema, pneumothorax or pleural effusion. Interstitial markings are diffusely coarsened with chronic features. The cardiopericardial silhouette is within normal limits for size. No acute bony abnormality. Degenerative changes noted right shoulder. Telemetry leads overlie the chest. IMPRESSION: Chronic interstitial coarsening without acute cardiopulmonary findings. Electronically Signed   By: Camellia Candle M.D.   On: 05/19/2024 07:14     Assessment & Plan:  David Callahan is a 68 y.o. male who was admitted with an acute upper GI bleed.  He failed endoscopic control of his duodenal bleeding ulcer.  General surgery emergently consulted for operative intervention.  -Will plan to take the patient emergently to the operating room for control of duodenal bleeding ulcer after failed endoscopic control.  He has no family with which I can obtain consent from.  -Patient currently receiving 4th and 5th units of PRBCs -Currently hemodynamically stable -Further recommendations to follow surgery  Note: Portions of this report may have been transcribed using voice recognition software. Every effort has been made to ensure accuracy; however, inadvertent computerized transcription errors may still be present.   -- Dorothyann Brittle, DO [facility] [address] [place], [place] [address] [phone] (office)

## 2024-05-20 NOTE — Interval H&P Note (Signed)
 History and Physical Interval Note:  05/20/2024 2:08 PM  David Callahan  has presented today for surgery, with the diagnosis of melena, anemia, coffee ground emesis.  The various methods of treatment have been discussed with the patient and family. After consideration of risks, benefits and other options for treatment, the patient has consented to  Procedure(s): EGD (ESOPHAGOGASTRODUODENOSCOPY) (N/A) as a surgical intervention.  The patient's history has been reviewed, patient examined, no change in status, stable for surgery.  I have reviewed the patient's chart and labs.  Questions were answered to the patient's satisfaction.     David Callahan    Patient seen and examined in short stay CTA results reviewed.  Hemoglobin 7.2 EGD with therapeutic intervention as feasible/appropriate per plan.  The risks, benefits, limitations, alternatives and imponderables have been reviewed with the patient. Potential for esophageal dilation, biopsy, etc. have also been reviewed.  Questions have been answered. All parties agreeable.

## 2024-05-20 NOTE — Progress Notes (Signed)
 Nurse to nurse report called to Debbie RN at [facility] 66M. [facility] transport set up.

## 2024-05-20 NOTE — Plan of Care (Addendum)
 This patient was admitted to [facility] / MICU overnight. Upon arrival, the patient is orally intubated, mechanically ventilated, and sedated with continuous infusions of Propofol  and Fentanyl ; RASS upon arrival is -5. FiO2 upon arrival is 70% and later weaned to 40%. No vasopressor requirement at this time. PIV access only for venous access. Left radial arterial line in place. Foley catheter in place upon arrival to unit. Midline abdominal honeycomb dressing without evidence of drainage. JP to RUQ. Single metal container of pills is sent to pharmacy per protocol for storage. Unable to contact patient's family overnight to complete blood consent form. Reached out to [facility] team at around 2345 hours overnight to request ICU glycemic control orders; awaiting new orders as of time of writing.  Edit 0445 hours: Sedation lightened this AM for WUA. The patient follows commands with equal antigravity effect in all four extremities. The patient was in obvious distress while awake: ventilator dyssynchrony, SBP > 165 mmHg per arterial line, coughing/gagging, new dark/red secretions from NGT, so sedation was up-titrated per parameters to the lowest amount possible to maintain RASS/CPOT goals.   Problem: Education: Goal: Knowledge of General Education information will improve Description: Including pain rating scale, medication(s)/side effects and non-pharmacologic comfort measures Outcome: Progressing   Problem: Health Behavior/Discharge Planning: Goal: Ability to manage health-related needs will improve Outcome: Progressing   Problem: Clinical Measurements: Goal: Ability to maintain clinical measurements within normal limits will improve Outcome: Progressing Goal: Will remain free from infection Outcome: Progressing Goal: Diagnostic test results will improve Outcome: Progressing Goal: Respiratory complications will improve Outcome: Progressing Goal: Cardiovascular complication will be avoided Outcome:  Progressing   Problem: Activity: Goal: Risk for activity intolerance will decrease Outcome: Progressing   Problem: Nutrition: Goal: Adequate nutrition will be maintained Outcome: Progressing   Problem: Coping: Goal: Level of anxiety will decrease Outcome: Progressing   Problem: Elimination: Goal: Will not experience complications related to bowel motility Outcome: Progressing Goal: Will not experience complications related to urinary retention Outcome: Progressing   Problem: Pain Managment: Goal: General experience of comfort will improve and/or be controlled Outcome: Progressing   Problem: Safety: Goal: Ability to remain free from injury will improve Outcome: Progressing   Problem: Skin Integrity: Goal: Risk for impaired skin integrity will decrease Outcome: Progressing   Problem: Activity: Goal: Ability to tolerate increased activity will improve Outcome: Progressing   Problem: Respiratory: Goal: Ability to maintain a clear airway and adequate ventilation will improve Outcome: Progressing   Problem: Role Relationship: Goal: Method of communication will improve Outcome: Progressing

## 2024-05-20 NOTE — Op Note (Addendum)
 [facility] Operative Note  05/20/24  Preoperative Diagnosis: Bleeding duodenal ulcer   Postoperative Diagnosis: Bleeding posterior duodenal ulcer   Procedure(s) Performed: Exploratory laparotomy, control of bleeding duodenal ulcer, Omental patch over duodenotomy closure   Surgeon: Dorothyann Brittle, DO    Assistants: Oneil Budge, MD; Montie Seltzer, RNFA   Anesthesia: General endotracheal   Anesthesiologist: Herschell Hollering, MD    Specimens: None   Estimated Blood Loss: 500 cc (450 cc of which was evaluated from stomach)   Blood Replacement: Receiving his 5th unit PRBCs and 1st unit of platelets intraoperatively today   Complications: None   Wound Class: Contaminated   Operative Indications: Patient is a 68 year old male who was admitted to the hospital with an upper GI bleed.  He underwent a CTA of the abdomen which demonstrated a focal outpouching within the first portion of the duodenum which could represent a large ulcer and the GDA comes into close approximation with this ulcer.  He underwent an EGD, which demonstrated an actively spurting vessel from a posterior duodenal ulcer.  Multiple attempts were made to obtain control via epinephrine  and clips, however bleeding was not able to be controlled.  General surgery was emergently consulted for operative control of bleeding duodenal ulcer.  The case was declared an emergency and consent was unable to be obtained as patient was already intubated, and daughter's number went to voicemail.  Findings: -3 cm posterior duodenal ulcer with a spurting vessel with an overlying endoscopic clip placed.  Vessel was suture-ligated, which controlled bleeding -Longitudinal duodenotomy closed transversely in 2 layers -Hemostasis noted at the completion of case   Procedure: The patient was taken to the operating room and placed supine. General endotracheal was already in place upon entering the operating room. Intravenous  antibiotics were administered per protocol.  A nasogastric tube was positioned to decompress the stomach. The abdomen was prepared and draped in the usual sterile fashion.  A time-out was completed verifying correct patient, procedure, site, positioning, and implant(s) and/or special equipment prior to beginning this procedure.  A vertical midline incision was made from the xiphoid to just above the umbilicus.  This was deepened through the subcutaneous tissues, and hemostasis was achieved with electrocautery.  The linea alba was identified, incised, and the peritoneal cavity was entered.  The abdomen was explored.  Adhesions of omentum over top of the duodenum were taken down with electrocautery and LigaSure.  Once the anterior aspect of the duodenum was readily visible, the area was palpated, and the underlying endoscopic clips were palpated.  A longitudinal anterior duodenotomy was created using electrocautery.  A large volume of blood was evacuated from the duodenum and stomach, approximately 450 cc.  Upon inspection of the duodenal bulb, there was a 3 cm posterior duodenal ulcer with multiple endoscopic clips present.  One of the clips was attached to a bleeding vessel.  To prevent tearing of the tissue and blood vessels, decision was made to leave the clips in place.  Hemostasis was achieved by performing a figure-of-eight stitch around the bleeding ulcer with the associated clip.  There was no other areas of bleeding on the base of this ulcer, and there were no other visible vessels.  Surgiflo with thrombin  was applied to the ulcer bed.  The longitudinal incision was then closed in a transverse fashion in a double layer.  The first layer was closed with a running 2-0 Vicryl.  The suture line was then imbricated with 2-0 silk in a Lembert  fashion.  A tongue of omentum was then brought up and sutured to cover the suture line.  The abdomen was then irrigated with warm saline.  The NG tube was verified to be in  appropriate position within the stomach.  A JP drain was then placed and was overlying the duodenal closure.  The fascia was then closed with 0 PDS from the superior and inferior aspects in a running fashion.  The incision was localized with Marcaine .  Hemostasis was achieved.  The skin was closed with skin staples.  A honeycomb dressing and a drain sponge were applied.  Dr. Mavis was assisting throughout the procedure and was present for the critical portions of the case, specifically exploring the duodenum, controlling the bleeding duodenal ulcer, and abdominal closure.   Final inspection revealed acceptable hemostasis. All counts were correct at the end of the case. The patient was transferred to the ICU intubated and sedated.  He will be transferred down to [facility] ICU for further resuscitation.   Dorothyann Brittle, DO  [facility] [address] [place], [place] [address] [phone] (office)

## 2024-05-20 NOTE — Progress Notes (Signed)
 1 bag of medication in a tin box removed from pharmacy in sealed bag and given to [facility] for transport to [facility],

## 2024-05-20 NOTE — Telephone Encounter (Signed)
 No change hemoglobin 7.2 this morning additional unit of packed RBCs hanging.  EGD today with therapeutic intent.  Potential risk benefits limitations were reviewed.  Specifically its remotely possible patient may need surgery or IR intervention for probable duodenal ulcer.  Questions answered he is agreeable.

## 2024-05-21 ENCOUNTER — Encounter (HOSPITAL_COMMUNITY): Payer: Self-pay | Admitting: Surgery

## 2024-05-21 LAB — CBC WITH DIFFERENTIAL/PLATELET
Abs Immature Granulocytes: 0.3 K/uL — ABNORMAL HIGH (ref 0.00–0.07)
Abs Immature Granulocytes: 0.31 K/uL — ABNORMAL HIGH (ref 0.00–0.07)
Abs Immature Granulocytes: 0.33 K/uL — ABNORMAL HIGH (ref 0.00–0.07)
Basophils Absolute: 0.1 K/uL (ref 0.0–0.1)
Basophils Absolute: 0 K/uL (ref 0.0–0.1)
Basophils Absolute: 0 K/uL (ref 0.0–0.1)
Basophils Relative: 0 %
Basophils Relative: 0 %
Basophils Relative: 0 %
Eosinophils Absolute: 0.1 K/uL (ref 0.0–0.5)
Eosinophils Absolute: 0 K/uL (ref 0.0–0.5)
Eosinophils Absolute: 0 K/uL (ref 0.0–0.5)
Eosinophils Relative: 0 %
Eosinophils Relative: 0 %
Eosinophils Relative: 0 %
HCT: 22.6 % — ABNORMAL LOW (ref 39.0–52.0)
HCT: 23.1 % — ABNORMAL LOW (ref 39.0–52.0)
HCT: 22.8 % — ABNORMAL LOW (ref 39.0–52.0)
Hemoglobin: 7.7 g/dL — ABNORMAL LOW (ref 13.0–17.0)
Hemoglobin: 7.5 g/dL — ABNORMAL LOW (ref 13.0–17.0)
Hemoglobin: 7.3 g/dL — ABNORMAL LOW (ref 13.0–17.0)
Immature Granulocytes: 2 %
Immature Granulocytes: 1 %
Immature Granulocytes: 2 %
Lymphocytes Relative: 12 %
Lymphocytes Relative: 10 %
Lymphocytes Relative: 10 %
Lymphs Abs: 2.3 K/uL (ref 0.7–4.0)
Lymphs Abs: 2.1 K/uL (ref 0.7–4.0)
Lymphs Abs: 2.1 K/uL (ref 0.7–4.0)
MCH: 30 pg (ref 26.0–34.0)
MCH: 29.2 pg (ref 26.0–34.0)
MCH: 29.4 pg (ref 26.0–34.0)
MCHC: 34.1 g/dL (ref 30.0–36.0)
MCHC: 32.5 g/dL (ref 30.0–36.0)
MCHC: 32 g/dL (ref 30.0–36.0)
MCV: 87.9 fL (ref 80.0–100.0)
MCV: 89.9 fL (ref 80.0–100.0)
MCV: 91.9 fL (ref 80.0–100.0)
Monocytes Absolute: 1.7 K/uL — ABNORMAL HIGH (ref 0.1–1.0)
Monocytes Absolute: 1.7 K/uL — ABNORMAL HIGH (ref 0.1–1.0)
Monocytes Absolute: 2 K/uL — ABNORMAL HIGH (ref 0.1–1.0)
Monocytes Relative: 9 %
Monocytes Relative: 8 %
Monocytes Relative: 9 %
Neutro Abs: 15.1 K/uL — ABNORMAL HIGH (ref 1.7–7.7)
Neutro Abs: 17.4 K/uL — ABNORMAL HIGH (ref 1.7–7.7)
Neutro Abs: 17.3 K/uL — ABNORMAL HIGH (ref 1.7–7.7)
Neutrophils Relative %: 77 %
Neutrophils Relative %: 81 %
Neutrophils Relative %: 79 %
Platelets: 180 K/uL (ref 150–400)
Platelets: 217 K/uL (ref 150–400)
Platelets: 201 K/uL (ref 150–400)
RBC: 2.57 MIL/uL — ABNORMAL LOW (ref 4.22–5.81)
RBC: 2.57 MIL/uL — ABNORMAL LOW (ref 4.22–5.81)
RBC: 2.48 MIL/uL — ABNORMAL LOW (ref 4.22–5.81)
RDW: 17.2 % — ABNORMAL HIGH (ref 11.5–15.5)
RDW: 17.6 % — ABNORMAL HIGH (ref 11.5–15.5)
RDW: 18.6 % — ABNORMAL HIGH (ref 11.5–15.5)
WBC: 19.5 K/uL — ABNORMAL HIGH (ref 4.0–10.5)
WBC: 21.6 K/uL — ABNORMAL HIGH (ref 4.0–10.5)
WBC: 21.7 K/uL — ABNORMAL HIGH (ref 4.0–10.5)
nRBC: 0.2 % (ref 0.0–0.2)
nRBC: 0.2 % (ref 0.0–0.2)
nRBC: 0.1 % (ref 0.0–0.2)

## 2024-05-21 LAB — BPAM FFP
Blood Product Expiration Date: 202508091553
Blood Product Expiration Date: 202508091553
Blood Product Expiration Date: 202508132359
Blood Product Expiration Date: 202508132359
Unit Type and Rh: 5100
Unit Type and Rh: 5100
Unit Type and Rh: 5100
Unit Type and Rh: 5100

## 2024-05-21 LAB — PREPARE FRESH FROZEN PLASMA
Unit division: 0
Unit division: 0
Unit division: 0

## 2024-05-21 LAB — CBC
HCT: 23.6 % — ABNORMAL LOW (ref 39.0–52.0)
Hemoglobin: 7.8 g/dL — ABNORMAL LOW (ref 13.0–17.0)
MCH: 29.2 pg (ref 26.0–34.0)
MCHC: 33.1 g/dL (ref 30.0–36.0)
MCV: 88.4 fL (ref 80.0–100.0)
Platelets: 181 K/uL (ref 150–400)
RBC: 2.67 MIL/uL — ABNORMAL LOW (ref 4.22–5.81)
RDW: 17.1 % — ABNORMAL HIGH (ref 11.5–15.5)
WBC: 22.5 K/uL — ABNORMAL HIGH (ref 4.0–10.5)
nRBC: 0.2 % (ref 0.0–0.2)

## 2024-05-21 LAB — GLUCOSE, CAPILLARY
Glucose-Capillary: 108 mg/dL — ABNORMAL HIGH (ref 70–99)
Glucose-Capillary: 58 mg/dL — ABNORMAL LOW (ref 70–99)
Glucose-Capillary: 67 mg/dL — ABNORMAL LOW (ref 70–99)
Glucose-Capillary: 90 mg/dL (ref 70–99)

## 2024-05-21 LAB — BASIC METABOLIC PANEL WITH GFR
Anion gap: 9 (ref 5–15)
BUN: 53 mg/dL — ABNORMAL HIGH (ref 8–23)
CO2: 21 mmol/L — ABNORMAL LOW (ref 22–32)
Calcium: 7.5 mg/dL — ABNORMAL LOW (ref 8.9–10.3)
Chloride: 105 mmol/L (ref 98–111)
Creatinine, Ser: 1.94 mg/dL — ABNORMAL HIGH (ref 0.61–1.24)
GFR, Estimated: 37 mL/min — ABNORMAL LOW (ref >60)
Glucose, Bld: 109 mg/dL — ABNORMAL HIGH (ref 70–99)
Potassium: 5 mmol/L (ref 3.5–5.1)
Sodium: 135 mmol/L (ref 135–145)

## 2024-05-21 LAB — TRIGLYCERIDES: Triglycerides: 157 mg/dL — ABNORMAL HIGH (ref <150)

## 2024-05-21 LAB — PHOSPHORUS: Phosphorus: 5.9 mg/dL — ABNORMAL HIGH (ref 2.5–4.6)

## 2024-05-21 LAB — MAGNESIUM: Magnesium: 1.7 mg/dL (ref 1.7–2.4)

## 2024-05-21 MED ORDER — HYDRALAZINE HCL 20 MG/ML IJ SOLN
10.0000 mg | INTRAMUSCULAR | Status: DC | PRN
  Administered 2024-05-21: 10 mg via INTRAVENOUS
  Filled 2024-05-21: qty 1

## 2024-05-21 MED ORDER — ALBUMIN HUMAN 25 % IV SOLN: 25.0000 g | Freq: Once | INTRAVENOUS | Status: DC

## 2024-05-21 MED ORDER — AMLODIPINE BESYLATE 10 MG PO TABS
10.0000 mg | ORAL_TABLET | Freq: Every day | ORAL | Status: DC
  Administered 2024-05-21: 10 mg
  Filled 2024-05-21: qty 1

## 2024-05-21 MED ORDER — DEXTROSE 50 % IV SOLN
12.5000 g | INTRAVENOUS | Status: AC
  Administered 2024-05-21: 12.5 g via INTRAVENOUS
  Filled 2024-05-21: qty 50

## 2024-05-21 MED ORDER — ORAL CARE MOUTH RINSE: 15.0000 mL | OROMUCOSAL | Status: DC | PRN

## 2024-05-21 MED ORDER — HYDROCODONE-ACETAMINOPHEN 10-325 MG PO TABS
1.0000 | ORAL_TABLET | ORAL | Status: DC | PRN
  Administered 2024-05-21 – 2024-05-24 (×15): 1
  Filled 2024-05-21 (×10): qty 1

## 2024-05-21 MED ORDER — DEXTROSE 50 % IV SOLN
12.5000 g | INTRAVENOUS | Status: AC
  Administered 2024-05-21: 12.5 g via INTRAVENOUS

## 2024-05-21 MED ORDER — ATORVASTATIN CALCIUM 40 MG PO TABS
40.0000 mg | ORAL_TABLET | Freq: Every day | ORAL | Status: DC
  Administered 2024-05-21 – 2024-05-24 (×6): 40 mg
  Filled 2024-05-21 (×3): qty 1

## 2024-05-21 MED ORDER — FENTANYL CITRATE PF 50 MCG/ML IJ SOSY
25.0000 ug | PREFILLED_SYRINGE | INTRAMUSCULAR | Status: DC | PRN
  Administered 2024-05-21 (×2): 25 ug via INTRAVENOUS
  Filled 2024-05-21 (×2): qty 1

## 2024-05-21 NOTE — Progress Notes (Signed)
 Wasted of Fentanyl  in [place] Cycle witnessed by Ronal Berry, RN.  Unable to waste in [place] due to medication pulled at [facility].

## 2024-05-21 NOTE — Anesthesia Postprocedure Evaluation (Signed)
 Anesthesia Post Note  Patient: David Callahan  Procedure(s) Performed: EGD (ESOPHAGOGASTRODUODENOSCOPY)  Patient location during evaluation: ICU Anesthesia Type: General Level of consciousness: patient remains intubated per anesthesia plan Vital Signs Assessment: post-procedure vital signs reviewed and stable Respiratory status: patient remains intubated per anesthesia plan Cardiovascular status: stable Anesthetic complications: no Comments: Patient was transferred ventilated to another OR where an ex lap was performed.    No notable events documented.   Last Vitals:  Vitals:   05/21/24 0600 05/21/24 0700  BP: 92/74 105/71  Pulse: 64 66  Resp: 18 18  Temp: 36.5 C   SpO2: 100% 100%    Last Pain:  Vitals:   05/21/24 0600  TempSrc: Axillary  PainSc:                  Andrea Limes

## 2024-05-21 NOTE — Consult Note (Signed)
 Reason for Consult: Previous surgery Referring Physician: Dr. Zaida Nancyann LITTIE David Callahan is an 68 y.o. male.  HPI: The patient is a 68 year old white male who had a bleeding duodenal ulcer yesterday that could not be controlled.  He was taken emergently to the operating room for oversewing of the gastroduodenal artery.  He seemed to tolerate the surgery well.  He was then transferred here for postop management intubated.  He was just extubated this morning.  He reports only some tenderness near his incision.  Past Medical History:  Diagnosis Date   Asthma    COPD (chronic obstructive pulmonary disease) (HCC)    Heart murmur    Hypertension    Sleep apnea     Past Surgical History:  Procedure Laterality Date   COLONOSCOPY     ESOPHAGOGASTRODUODENOSCOPY N/A 03/10/2024   Procedure: EGD (ESOPHAGOGASTRODUODENOSCOPY);  Surgeon: Cinderella Deatrice FALCON, MD;  [facility];  Service: Endoscopy;  Laterality: N/A;   ESOPHAGOGASTRODUODENOSCOPY N/A 04/06/2024   Procedure: EGD (ESOPHAGOGASTRODUODENOSCOPY);  Surgeon: Shaaron Lamar HERO, MD;  Location: [facility];  Service: Endoscopy;  Laterality: N/A;   JOINT REPLACEMENT Left    knee   KNEE SURGERY     LAPAROTOMY N/A 05/20/2024   Procedure: LAPAROTOMY, EXPLORATORY, CONTROL OF BLEEDING DUODENAL ULCER;  Surgeon: Evonnie Dorothyann LABOR, DO;  Location: [facility] ORS;  Service: General;  Laterality: N/A;    History reviewed. No pertinent family history.  Social History:  reports that he has been smoking cigarettes and cigars. He has never used smokeless tobacco. He reports that he does not currently use alcohol. He reports that he does not use drugs.  Allergies:  Allergies  Allergen Reactions   Motrin [Ibuprofen] Anaphylaxis and Swelling    Anti-inflammatory analgesics- cause anaphylaxis    Medications: I have reviewed the patient's current medications.  Results for orders placed or performed during the hospital encounter of 05/18/24 (from the past 48  hours)  CBC     Status: Abnormal   Collection Time: 05/19/24  3:47 PM  Result Value Ref Range   WBC 18.3 (H) 4.0 - 10.5 K/uL   RBC 3.20 (L) 4.22 - 5.81 MIL/uL   Hemoglobin 9.7 (L) 13.0 - 17.0 g/dL    Comment: REPEATED TO VERIFY POST TRANSFUSION SPECIMEN DELTA CHECK NOTED    HCT 28.5 (L) 39.0 - 52.0 %   MCV 89.1 80.0 - 100.0 fL   MCH 30.3 26.0 - 34.0 pg   MCHC 34.0 30.0 - 36.0 g/dL   RDW 82.9 (H) 88.4 - 84.4 %   Platelets 227 150 - 400 K/uL   nRBC 0.4 (H) 0.0 - 0.2 %    Comment: Performed at [facility], [address], [place], [place] [address]  Basic metabolic panel     Status: Abnormal   Collection Time: 05/20/24  4:12 AM  Result Value Ref Range   Sodium 137 135 - 145 mmol/L   Potassium 4.4 3.5 - 5.1 mmol/L   Chloride 102 98 - 111 mmol/L   CO2 26 22 - 32 mmol/L   Glucose, Bld 109 (H) 70 - 99 mg/dL    Comment: Glucose reference range applies only to samples taken after fasting for at least 8 hours.   BUN 52 (H) 8 - 23 mg/dL   Creatinine, Ser 8.54 (H) 0.61 - 1.24 mg/dL   Calcium  7.9 (L) 8.9 - 10.3 mg/dL   GFR, Estimated 53 (L) >60 mL/min    Comment: (NOTE) Calculated using the CKD-EPI Creatinine Equation (  2021)    Anion gap 9 5 - 15    Comment: Performed at [facility], [address], [place], [place] [address]  Hemoglobin and hematocrit, blood     Status: Abnormal   Collection Time: 05/20/24  4:12 AM  Result Value Ref Range   Hemoglobin 7.3 (L) 13.0 - 17.0 g/dL   HCT 78.1 (L) 60.9 - 47.9 %    Comment: Performed at [facility], [address] [place], [place] [address]  Prepare RBC (crossmatch)     Status: None   Collection Time: 05/20/24  8:16 AM  Result Value Ref Range   Order Confirmation      ORDER PROCESSED BY BLOOD BANK Performed at [facility], [address] [place], [place] [address]   Hemoglobin and hematocrit, blood     Status: Abnormal   Collection Time: 05/20/24 12:48 PM  Result Value Ref Range   Hemoglobin 7.2 (L) 13.0 - 17.0 g/dL   HCT 77.3 (L)  60.9 - 52.0 %    Comment: Performed at [facility], [address] [place], [place] [address]  Prepare RBC (crossmatch)     Status: None   Collection Time: 05/20/24  2:50 PM  Result Value Ref Range   Order Confirmation      ORDER PROCESSED BY BLOOD BANK Performed at [facility], [address] [place], [place] [address]   Prepare RBC (crossmatch)     Status: None   Collection Time: 05/20/24  3:15 PM  Result Value Ref Range   Order Confirmation      ORDER PROCESSED BY BLOOD BANK Performed at [facility], [address] [place], [place] [address]   Prepare fresh frozen plasma     Status: None (Preliminary result)   Collection Time: 05/20/24  3:15 PM  Result Value Ref Range   Unit Number [id number]    Blood Component Type THAWED PLASMA    Unit division 00    Status of Unit ALLOCATED    Transfusion Status OK TO TRANSFUSE    Unit Number [id number]    Blood Component Type THW PLS APHR    Unit division B0    Status of Unit ALLOCATED    Transfusion Status      OK TO TRANSFUSE Performed at [facility], [address] [place], [place] [address]    Unit Number [id number]    Blood Component Type FP24 PHR THW    Unit division 00    Status of Unit ALLOCATED    Transfusion Status OK TO TRANSFUSE    Unit Number [id number]    Blood Component Type FP24 PHR THW    Unit division 00    Status of Unit ALLOCATED    Transfusion Status OK TO TRANSFUSE   Prepare RBC     Status: None   Collection Time: 05/20/24  3:18 PM  Result Value Ref Range   Order Confirmation      ORDER PROCESSED BY BLOOD BANK Performed at [facility], [address], [place], [place] [address]   CBC     Status: Abnormal   Collection Time: 05/20/24  4:20 PM  Result Value Ref Range   WBC 19.3 (H) 4.0 - 10.5 K/uL   RBC 2.58 (L) 4.22 - 5.81 MIL/uL   Hemoglobin 7.8 (L) 13.0 - 17.0 g/dL   HCT 76.9 (L) 60.9 - 47.9 %   MCV 89.1 80.0 - 100.0 fL   MCH 30.2 26.0 - 34.0 pg   MCHC 33.9 30.0 - 36.0 g/dL  RDW  17.3 (H) 11.5 - 15.5 %   Platelets 185 150 - 400 K/uL   nRBC 0.3 (H) 0.0 - 0.2 %    Comment: Performed at [facility], [address] [place], [place] [address]  MRSA Next Gen by PCR, Nasal     Status: None   Collection Time: 05/20/24  7:42 PM   Specimen: Nasal Mucosa; Nasal Swab  Result Value Ref Range   MRSA by PCR Next Gen NOT DETECTED NOT DETECTED    Comment: (NOTE) The GeneXpert MRSA Assay (FDA approved for NASAL specimens only), is one component of a comprehensive MRSA colonization surveillance program. It is not intended to diagnose MRSA infection nor to guide or monitor treatment for MRSA infections. Test performance is not FDA approved in patients less than 2 years old. Performed at [facility], [address] [place], [place] [address]   Glucose, capillary     Status: Abnormal   Collection Time: 05/20/24  7:52 PM  Result Value Ref Range   Glucose-Capillary 142 (H) 70 - 99 mg/dL    Comment: Glucose reference range applies only to samples taken after fasting for at least 8 hours.  I-STAT 7, (LYTES, BLD GAS, ICA, H+H)     Status: Abnormal   Collection Time: 05/20/24  7:59 PM  Result Value Ref Range   pH, Arterial 7.370 7.35 - 7.45   pCO2 arterial 42.8 32 - 48 mmHg   pO2, Arterial 308 (H) 83 - 108 mmHg   Bicarbonate 24.7 20.0 - 28.0 mmol/L   TCO2 26 22 - 32 mmol/L   O2 Saturation 100 %   Acid-base deficit 1.0 0.0 - 2.0 mmol/L   Sodium 136 135 - 145 mmol/L   Potassium 4.6 3.5 - 5.1 mmol/L   Calcium , Ion 1.09 (L) 1.15 - 1.40 mmol/L   HCT 22.0 (L) 39.0 - 52.0 %   Hemoglobin 7.5 (L) 13.0 - 17.0 g/dL   Patient temperature 01.2 F    Sample type ARTERIAL   CBC     Status: Abnormal   Collection Time: 05/20/24  8:25 PM  Result Value Ref Range   WBC 29.1 (H) 4.0 - 10.5 K/uL   RBC 2.86 (L) 4.22 - 5.81 MIL/uL   Hemoglobin 8.4 (L) 13.0 - 17.0 g/dL   HCT 74.9 (L) 60.9 - 47.9 %   MCV 87.4 80.0 - 100.0 fL   MCH 29.4 26.0 - 34.0 pg   MCHC 33.6 30.0 - 36.0 g/dL   RDW  83.2 (H) 88.4 - 15.5 %   Platelets 174 150 - 400 K/uL   nRBC 0.2 0.0 - 0.2 %    Comment: Performed at [facility], [address] [place], [place] [address]  Comprehensive metabolic panel with GFR     Status: Abnormal   Collection Time: 05/20/24  8:25 PM  Result Value Ref Range   Sodium 137 135 - 145 mmol/L   Potassium 4.9 3.5 - 5.1 mmol/L   Chloride 105 98 - 111 mmol/L   CO2 24 22 - 32 mmol/L   Glucose, Bld 134 (H) 70 - 99 mg/dL    Comment: Glucose reference range applies only to samples taken after fasting for at least 8 hours.   BUN 53 (H) 8 - 23 mg/dL   Creatinine, Ser 8.26 (H) 0.61 - 1.24 mg/dL   Calcium  7.4 (L) 8.9 - 10.3 mg/dL   Total Protein 3.3 (L) 6.5 - 8.1 g/dL   Albumin  1.8 (L) 3.5 -  5.0 g/dL   AST 16 15 - 41 U/L   ALT 13 0 - 44 U/L   Alkaline Phosphatase 41 38 - 126 U/L   Total Bilirubin 0.5 0.0 - 1.2 mg/dL   GFR, Estimated 43 (L) >60 mL/min    Comment: (NOTE) Calculated using the CKD-EPI Creatinine Equation (2021)    Anion gap 8 5 - 15    Comment: Performed at [facility], [address] [place], [place] [address]  Magnesium      Status: None   Collection Time: 05/20/24  8:25 PM  Result Value Ref Range   Magnesium  1.7 1.7 - 2.4 mg/dL    Comment: Performed at [facility], [address] [place], [place] [address]  Phosphorus     Status: Abnormal   Collection Time: 05/20/24  8:25 PM  Result Value Ref Range   Phosphorus 5.4 (H) 2.5 - 4.6 mg/dL    Comment: Performed at [facility], [address], [place], [place] [address]  Protime-INR     Status: Abnormal   Collection Time: 05/20/24  8:40 PM  Result Value Ref Range   Prothrombin Time 16.5 (H) 11.4 - 15.2 seconds   INR 1.3 (H) 0.8 - 1.2    Comment: (NOTE) INR goal varies based on device and disease states. Performed at [facility], [address] [place], [place] [address]   Glucose, capillary     Status: Abnormal   Collection Time: 05/20/24 11:33 PM  Result Value Ref  Range   Glucose-Capillary 121 (H) 70 - 99 mg/dL    Comment: Glucose reference range applies only to samples taken after fasting for at least 8 hours.  Basic metabolic panel with GFR     Status: Abnormal   Collection Time: 05/21/24  2:55 AM  Result Value Ref Range   Sodium 135 135 - 145 mmol/L   Potassium 5.0 3.5 - 5.1 mmol/L   Chloride 105 98 - 111 mmol/L   CO2 21 (L) 22 - 32 mmol/L   Glucose, Bld 109 (H) 70 - 99 mg/dL    Comment: Glucose reference range applies only to samples taken after fasting for at least 8 hours.   BUN 53 (H) 8 - 23 mg/dL   Creatinine, Ser 8.05 (H) 0.61 - 1.24 mg/dL   Calcium  7.5 (L) 8.9 - 10.3 mg/dL   GFR, Estimated 37 (L) >60 mL/min    Comment: (NOTE) Calculated using the CKD-EPI Creatinine Equation (2021)    Anion gap 9 5 - 15    Comment: Performed at [facility], [address] [place], [place] [address]  Magnesium      Status: None   Collection Time: 05/21/24  2:55 AM  Result Value Ref Range   Magnesium  1.7 1.7 - 2.4 mg/dL    Comment: Performed at [facility], [address], [place], [place] [address]  Phosphorus     Status: Abnormal   Collection Time: 05/21/24  2:55 AM  Result Value Ref Range   Phosphorus 5.9 (H) 2.5 - 4.6 mg/dL    Comment: Performed at [facility], [address] [place], [place] [address]  CBC     Status: Abnormal   Collection Time: 05/21/24  2:55 AM  Result Value Ref Range   WBC 22.5 (H) 4.0 - 10.5 K/uL   RBC 2.67 (L) 4.22 - 5.81 MIL/uL   Hemoglobin 7.8 (L) 13.0 - 17.0 g/dL   HCT 76.3 (L) 60.9 -  52.0 %   MCV 88.4 80.0 - 100.0 fL   MCH 29.2 26.0 - 34.0 pg   MCHC 33.1 30.0 - 36.0 g/dL   RDW 82.8 (H) 88.4 - 84.4 %   Platelets 181 150 - 400 K/uL   nRBC 0.2 0.0 - 0.2 %    Comment: Performed at [facility], [address] [place], [place] [address]  Glucose, capillary     Status: Abnormal   Collection Time: 05/21/24  3:32 AM  Result Value Ref Range   Glucose-Capillary 108 (H) 70 - 99 mg/dL     Comment: Glucose reference range applies only to samples taken after fasting for at least 8 hours.  CBC with Differential/Platelet     Status: Abnormal   Collection Time: 05/21/24  8:03 AM  Result Value Ref Range   WBC 19.5 (H) 4.0 - 10.5 K/uL   RBC 2.57 (L) 4.22 - 5.81 MIL/uL   Hemoglobin 7.7 (L) 13.0 - 17.0 g/dL   HCT 77.3 (L) 60.9 - 47.9 %   MCV 87.9 80.0 - 100.0 fL   MCH 30.0 26.0 - 34.0 pg   MCHC 34.1 30.0 - 36.0 g/dL   RDW 82.7 (H) 88.4 - 84.4 %   Platelets 180 150 - 400 K/uL   nRBC 0.2 0.0 - 0.2 %   Neutrophils Relative % 77 %   Neutro Abs 15.1 (H) 1.7 - 7.7 K/uL   Lymphocytes Relative 12 %   Lymphs Abs 2.3 0.7 - 4.0 K/uL   Monocytes Relative 9 %   Monocytes Absolute 1.7 (H) 0.1 - 1.0 K/uL   Eosinophils Relative 0 %   Eosinophils Absolute 0.1 0.0 - 0.5 K/uL   Basophils Relative 0 %   Basophils Absolute 0.1 0.0 - 0.1 K/uL   Immature Granulocytes 2 %   Abs Immature Granulocytes 0.30 (H) 0.00 - 0.07 K/uL    Comment: Performed at [facility], [address], [place], [place] [address]    DG Chest Port 1 View Result Date: 05/20/2024 CLINICAL DATA:  [id number] Endotracheally intubated [id number] EXAM: PORTABLE CHEST - 1 VIEW COMPARISON:  May 19, 2024 FINDINGS: Esophagogastric tube terminates in the mid trachea. Esophagogastric tube courses below the diaphragm terminating outside the field of view. The last side hole is at the gastric cardia. No focal airspace consolidation, pleural effusion, or pneumothorax. No cardiomegaly. No acute fracture or destructive lesions. Multilevel thoracic osteophytosis. IMPRESSION: Well-positioned endotracheal tube. Esophagogastric tube courses below the diaphragm with the distal tip not included in the field of view. Electronically Signed   By: Rogelia Myers M.D.   On: 05/20/2024 18:50    Review of Systems  Constitutional: Negative.   HENT: Negative.    Eyes: Negative.   Respiratory: Negative.    Cardiovascular: Negative.   Gastrointestinal:   Positive for abdominal pain.  Endocrine: Negative.   Genitourinary: Negative.   Musculoskeletal: Negative.   Skin: Negative.   Allergic/Immunologic: Negative.   Neurological: Negative.   Hematological: Negative.   Psychiatric/Behavioral: Negative.     Blood pressure (!) 149/93, pulse 67, temperature 98.7 F (37.1 C), temperature source Axillary, resp. rate 12, height 5' 10 (1.778 m), weight 79.2 kg, SpO2 98%. Physical Exam Vitals reviewed.  Constitutional:      General: He is not in acute distress.    Appearance: Normal appearance.  HENT:     Head: Normocephalic and atraumatic.     Comments: NG in place    Right Ear: External ear normal.  Left Ear: External ear normal.     Nose: Nose normal.     Mouth/Throat:     Mouth: Mucous membranes are dry.     Pharynx: Oropharynx is clear.  Eyes:     General: No scleral icterus.    Extraocular Movements: Extraocular movements intact.     Conjunctiva/sclera: Conjunctivae normal.     Pupils: Pupils are equal, round, and reactive to light.  Cardiovascular:     Rate and Rhythm: Normal rate and regular rhythm.     Pulses: Normal pulses.     Heart sounds: Normal heart sounds.  Pulmonary:     Effort: Pulmonary effort is normal. No respiratory distress.     Breath sounds: Normal breath sounds.  Abdominal:     General: Abdomen is flat.     Palpations: Abdomen is soft.     Comments: There is moderate tenderness near the incision.  Drain output is serosanguineous  Musculoskeletal:        General: No swelling or deformity. Normal range of motion.     Cervical back: Normal range of motion and neck supple.  Skin:    General: Skin is warm and dry.     Coloration: Skin is not jaundiced.  Neurological:     General: No focal deficit present.     Mental Status: He is alert and oriented to person, place, and time.  Psychiatric:        Mood and Affect: Mood normal.        Behavior: Behavior normal.     Assessment/Plan: The patient is  postop day 1 from exploratory laparotomy and oversewing of the gastroduodenal artery.  He should continue with his NG tube and bowel rest until bowel function returns.  His stomach should be studied with Gastrografin upper GI once bowel function returns prior to removing the NG tube to rule out any leak.  He should be transfused as necessary.  We will monitor his drain output.  We will follow along with you.  Deward Null III 05/21/2024, 10:45 AM

## 2024-05-21 NOTE — Procedures (Signed)
 Extubation Procedure Note  Patient Details:   Name: David Callahan DOB: 1956-01-30 MRN: [id number]   Airway Documentation:    Vent end date: (not recorded) Vent end time: (not recorded)   Evaluation  O2 sats: stable throughout Complications: No apparent complications Patient did tolerate procedure well. Bilateral Breath Sounds: Clear, Diminished   Yes, pt could speak post extubation.  Pt extubated to room air.   Ozell KATHEE Blase 05/21/2024, 9:57 AM

## 2024-05-21 NOTE — Addendum Note (Signed)
 Addendum  created 05/21/24 1024 by Para Jerelene CROME, CRNA   Attestation recorded in [place], [employer] filed

## 2024-05-21 NOTE — Progress Notes (Signed)
 NAME:  David Callahan, MRN:  [id number], DOB:  07-May-1956, LOS: 3 ADMISSION DATE:  05/18/2024, CONSULTATION DATE:  05/21/24 REFERRING MD:  Dr Willette, CHIEF COMPLAINT:  Duodenal bleeding ulcer    History of Present Illness:  This is a 68 year old male with a past medical history of COPD, hypertension, PUD presenting with hematemesis and black stools. EGD with large duodenal ulcer. Went to the OR and underwent exploratory laparotomy, control of bleeding duodenal ulcer, Omental patch over duodenotomy closure underwent. He is presenting to the ICU post op  Pertinent  Medical History  COPD HTN PUD   Significant Hospital Events: Including procedures, antibiotic start and stop dates in addition to other pertinent events   05/20/24 exploratory laparotomy, control of bleeding duodenal ulcer, Omental patch over duodenotomy closure underwent. 05/21/24 Extubated   Interim History / Subjective:  Follows commands , has no complaints   Objective    Blood pressure 105/71, pulse 66, temperature 97.7 F (36.5 C), temperature source Axillary, resp. rate 18, height 5' 10 (1.778 m), weight 79.2 kg, SpO2 100%.    Vent Mode: PRVC FiO2 (%):  [40 %-100 %] 40 % Set Rate:  [18 bmp] 18 bmp Vt Set:  [580 mL] 580 mL PEEP:  [5 cmH20] 5 cmH20 Plateau Pressure:  [13 cmH20-15 cmH20] 15 cmH20   Intake/Output Summary (Last 24 hours) at 05/21/2024 0735 Last data filed at 05/21/2024 0700 Gross per 24 hour  Intake 2114.61 ml  Output 2870 ml  Net -755.39 ml   Filed Weights   05/18/24 0751 05/20/24 1342 05/20/24 1941  Weight: 79.4 kg 79.4 kg 79.2 kg    Examination: General: elderly male not in distress Lungs: CTAB Cardiovascular: RRR , Nl S1,s2 Abdomen: Soft, nt, nd Extremities: Warm Neuro: motor and sensation intact   Resolved problem list   Assessment and Plan   Neuro  No issues    Pulm #COPD on Incruse at home #Sleep apnea  #Acute hypoxic respiratory failure (improved) - Brovana  and eupleri nebs     Cardiovascular  #Hemorrhagic shock (improved) #HTN - Off pressors  - Trending Hb q6h  - holding home ACE and dilt (AKI and low HR) - Start amlodipine  and IV hydral PRN)   GI #Duodenal bleeding ulcer  S/P Ex lap  Per surgery: Gastrografin upper GI study once bowel function returns through NG tube.   PPI BID  - HB q6h  - NPO  - Type and screen  - 2 large PIV    Renal  AKI  #BPH Hold terzosin  Likely prerenal due to shock  Trend Cr Avoid nephrotoxins  Endo   Glucose levels acceptable   Hem/ONC SCD, no chem dvt ppx    ID No evidence of infection at this time  Anceph for surgical ppx for 48 hours     Best Practice (right click and Reselect all SmartList Selections daily)   Diet/type: NPO w/ oral meds DVT prophylaxis SCD Pressure ulcer(s): pressure ulcer assessment deferred  GI prophylaxis: PPI Lines: Arterial Line Foley:  Yes, and it is still needed Code Status:  full code Last date of multidisciplinary goals of care discussion [NA]  Labs   CBC: Recent Labs  Lab 05/18/24 0447 05/18/24 1151 05/19/24 0640 05/19/24 0744 05/19/24 1547 05/20/24 0412 05/20/24 1248 05/20/24 1620 05/20/24 1959 05/20/24 2025 05/21/24 0255  WBC 16.4*   < > 16.5*  --  18.3*  --   --  19.3*  --  29.1* 22.5*  NEUTROABS 14.2*  --   --   --   --   --   --   --   --   --   --  HGB 5.3*   < > 6.6*   < > 9.7*   < > 7.2* 7.8* 7.5* 8.4* 7.8*  HCT 16.8*   < > 19.6*   < > 28.5*   < > 22.6* 23.0* 22.0* 25.0* 23.6*  MCV 94.9   < > 90.7  --  89.1  --   --  89.1  --  87.4 88.4  PLT 342   < > 228  --  227  --   --  185  --  174 181   < > = values in this interval not displayed.    Basic Metabolic Panel: Recent Labs  Lab 05/18/24 0447 05/19/24 0640 05/20/24 0412 05/20/24 1959 05/20/24 2025 05/21/24 0255  NA 142 138 137 136 137 135  K 5.1 4.7 4.4 4.6 4.9 5.0  CL 110 108 102  --  105 105  CO2 22 23 26   --  24 21*  GLUCOSE 118* 126* 109*  --  134* 109*  BUN 52* 46* 52*   --  53* 53*  CREATININE 1.36* 1.26* 1.45*  --  1.73* 1.94*  CALCIUM  8.2* 7.4* 7.9*  --  7.4* 7.5*  MG  --   --   --   --  1.7 1.7  PHOS  --   --   --   --  5.4* 5.9*   GFR: Estimated Creatinine Clearance: 38.2 mL/min (A) (by C-G formula based on SCr of 1.94 mg/dL (H)). Recent Labs  Lab 05/19/24 1547 05/20/24 1620 05/20/24 2025 05/21/24 0255  WBC 18.3* 19.3* 29.1* 22.5*    Liver Function Tests: Recent Labs  Lab 05/18/24 0447 05/20/24 2025  AST 25 16  ALT 20 13  ALKPHOS 68 41  BILITOT 0.4 0.5  PROT 5.0* 3.3*  ALBUMIN  2.4* 1.8*   Recent Labs  Lab 05/18/24 0447  LIPASE 26   No results for input(s): AMMONIA in the last 168 hours.  ABG    Component Value Date/Time   PHART 7.370 05/20/2024 1959   PCO2ART 42.8 05/20/2024 1959   PO2ART 308 (H) 05/20/2024 1959   HCO3 24.7 05/20/2024 1959   TCO2 26 05/20/2024 1959   ACIDBASEDEF 1.0 05/20/2024 1959   O2SAT 100 05/20/2024 1959     Coagulation Profile: Recent Labs  Lab 05/18/24 0447 05/20/24 2040  INR 1.2 1.3*    Cardiac Enzymes: No results for input(s): CKTOTAL, CKMB, CKMBINDEX, TROPONINI in the last 168 hours.  HbA1C: Hgb A1c MFr Bld  Date/Time Value Ref Range Status  06/05/2022 05:03 AM 5.4 4.8 - 5.6 % Final    Comment:    (NOTE) Pre diabetes:          5.7%-6.4%  Diabetes:              >6.4%  Glycemic control for   <7.0% adults with diabetes     CBG: Recent Labs  Lab 05/19/24 0503 05/20/24 1952 05/20/24 2333 05/21/24 0332  GLUCAP 110* 142* 121* 108*    Review of Systems:   Negative except as above   Past Medical History:  He,  has a past medical history of Asthma, COPD (chronic obstructive pulmonary disease) (HCC), Heart murmur, Hypertension, and Sleep apnea.   Surgical History:   Past Surgical History:  Procedure Laterality Date   COLONOSCOPY     ESOPHAGOGASTRODUODENOSCOPY N/A 03/10/2024   Procedure: EGD (ESOPHAGOGASTRODUODENOSCOPY);  Surgeon: Cinderella Deatrice FALCON, MD;   Location: [facility];  Service: Endoscopy;  Laterality: N/A;   ESOPHAGOGASTRODUODENOSCOPY N/A 04/06/2024  Procedure: EGD (ESOPHAGOGASTRODUODENOSCOPY);  Surgeon: Shaaron Lamar HERO, MD;  Location: [facility];  Service: Endoscopy;  Laterality: N/A;   JOINT REPLACEMENT Left    knee   KNEE SURGERY     LAPAROTOMY N/A 05/20/2024   Procedure: LAPAROTOMY, EXPLORATORY, CONTROL OF BLEEDING DUODENAL ULCER;  Surgeon: Evonnie Dorothyann LABOR, DO;  Location: [facility];  Service: General;  Laterality: N/A;     Social History:   reports that he has been smoking cigarettes and cigars. He has never used smokeless tobacco. He reports that he does not currently use alcohol. He reports that he does not use drugs.   Family History:  His family history is not on file.   Allergies Allergies  Allergen Reactions   Motrin [Ibuprofen] Anaphylaxis and Swelling    Anti-inflammatory analgesics- cause anaphylaxis     Home Medications  Prior to Admission medications   Medication Sig Start Date End Date Taking? Authorizing Provider  albuterol  (VENTOLIN  HFA) 108 (90 Base) MCG/ACT inhaler Inhale 2 puffs into the lungs every 6 (six) hours as needed for shortness of breath.   Yes [provider]  atorvastatin  (LIPITOR) 40 MG tablet Take 40 mg by mouth daily.   Yes [provider]  benazepril  (LOTENSIN ) 40 MG tablet Take 40 mg by mouth daily. 04/27/24  Yes [provider]  cyclobenzaprine  (FLEXERIL ) 10 MG tablet Take 10 mg by mouth at bedtime. 09/17/20  Yes [provider]  diltiazem  (CARDIZEM  CD) 360 MG 24 hr capsule Take 360 mg by mouth daily. 02/16/24  Yes [provider]  esomeprazole  (NEXIUM ) 40 MG capsule Take 1 capsule (40 mg total) by mouth 2 (two) times daily before a meal. 05/08/24 05/08/25 Yes Dorrell, Lamar, MD  feeding supplement, ENSURE COMPLETE, (ENSURE COMPLETE) LIQD Take 237 mLs by mouth 2 (two) times daily between meals. 03/12/24  Yes Tat, Alm, MD  ferrous sulfate   325 (65 FE) MG tablet Take 325 mg by mouth daily with breakfast.   Yes [provider]  HYDROcodone -acetaminophen  (NORCO) 10-325 MG tablet Take 1 tablet by mouth every 4 (four) hours as needed for moderate pain (pain score 4-6). 02/10/24  Yes [provider]  rOPINIRole  (REQUIP ) 1 MG tablet Take 1 mg by mouth daily. 02/16/24  Yes [provider]  sucralfate  (CARAFATE ) 1 GM/10ML suspension Take 10 mLs (1 g total) by mouth 4 (four) times daily -  with meals and at bedtime for 28 days. 04/07/24 05/18/24 Yes Shah, Pratik D, DO  terazosin  (HYTRIN ) 10 MG capsule Take 10 mg by mouth daily. 02/16/24  Yes [provider]  traZODone  (DESYREL ) 150 MG tablet Take 150 mg by mouth at bedtime as needed for sleep. 02/16/24  Yes [provider]  umeclidinium bromide  (INCRUSE ELLIPTA ) 62.5 MCG/ACT AEPB Inhale 1 puff into the lungs daily. 06/23/22  Yes Bryn Bernardino NOVAK, MD     Critical care time: 20     The patient is critically ill due to life threatening GI bleeding requiring frequent monitoring, weaning off ventilator and extubating .  Critical care was necessary to treat or prevent imminent or life-threatening deterioration. Critical care time was spent by me on the following activities: development of a treatment plan with the patient and/or surrogate as well as nursing, discussions with consultants, evaluation of the patient's response to treatment, examination of the patient, obtaining a history from the patient or surrogate, ordering and performing treatments and interventions, ordering and review of laboratory studies, ordering and review of radiographic studies, review of  telemetry data including pulse oximetry, re-evaluation of patient's condition and participation in multidisciplinary rounds.   I personally spent 34  minutes providing critical care not including any separately billable procedures.   Zola LOISE Herter, MD [place] [facility] 05/21/2024 3:38 PM

## 2024-05-21 NOTE — Plan of Care (Signed)
  Problem: Education: Goal: Knowledge of General Education information will improve Description: Including pain rating scale, medication(s)/side effects and non-pharmacologic comfort measures Outcome: Not Progressing   Problem: Health Behavior/Discharge Planning: Goal: Ability to manage health-related needs will improve Outcome: Not Progressing   Problem: Clinical Measurements: Goal: Ability to maintain clinical measurements within normal limits will improve Outcome: Not Progressing Goal: Will remain free from infection Outcome: Not Progressing Goal: Diagnostic test results will improve Outcome: Not Progressing Goal: Respiratory complications will improve Outcome: Not Progressing Goal: Cardiovascular complication will be avoided Outcome: Not Progressing   Problem: Activity: Goal: Risk for activity intolerance will decrease Outcome: Not Progressing   Problem: Nutrition: Goal: Adequate nutrition will be maintained Outcome: Not Progressing   Problem: Coping: Goal: Level of anxiety will decrease Outcome: Not Progressing   Problem: Elimination: Goal: Will not experience complications related to bowel motility Outcome: Not Progressing Goal: Will not experience complications related to urinary retention Outcome: Not Progressing   Problem: Pain Managment: Goal: General experience of comfort will improve and/or be controlled Outcome: Not Progressing   Problem: Safety: Goal: Ability to remain free from injury will improve Outcome: Not Progressing   Problem: Skin Integrity: Goal: Risk for impaired skin integrity will decrease Outcome: Not Progressing   Problem: Activity: Goal: Ability to tolerate increased activity will improve Outcome: Not Progressing   Problem: Respiratory: Goal: Ability to maintain a clear airway and adequate ventilation will improve Outcome: Not Progressing   Problem: Role Relationship: Goal: Method of communication will improve Outcome: Not  Progressing

## 2024-05-21 NOTE — Addendum Note (Signed)
 Addendum  created 05/21/24 1023 by Para Jerelene CROME, CRNA   Attestation recorded in [place], [employer] filed

## 2024-05-22 ENCOUNTER — Encounter (HOSPITAL_COMMUNITY): Payer: Self-pay | Admitting: Surgery

## 2024-05-22 ENCOUNTER — Other Ambulatory Visit: Payer: Self-pay

## 2024-05-22 LAB — CBC WITH DIFFERENTIAL/PLATELET
Abs Immature Granulocytes: 0.32 K/uL — ABNORMAL HIGH (ref 0.00–0.07)
Abs Immature Granulocytes: 0.35 K/uL — ABNORMAL HIGH (ref 0.00–0.07)
Abs Immature Granulocytes: 0.44 K/uL — ABNORMAL HIGH (ref 0.00–0.07)
Basophils Absolute: 0 K/uL (ref 0.0–0.1)
Basophils Absolute: 0 K/uL (ref 0.0–0.1)
Basophils Absolute: 0.1 K/uL (ref 0.0–0.1)
Basophils Relative: 0 %
Basophils Relative: 0 %
Basophils Relative: 0 %
Eosinophils Absolute: 0 K/uL (ref 0.0–0.5)
Eosinophils Absolute: 0 K/uL (ref 0.0–0.5)
Eosinophils Absolute: 0 K/uL (ref 0.0–0.5)
Eosinophils Relative: 0 %
Eosinophils Relative: 0 %
Eosinophils Relative: 0 %
HCT: 21.4 % — ABNORMAL LOW (ref 39.0–52.0)
HCT: 22.4 % — ABNORMAL LOW (ref 39.0–52.0)
HCT: 23.5 % — ABNORMAL LOW (ref 39.0–52.0)
Hemoglobin: 7 g/dL — ABNORMAL LOW (ref 13.0–17.0)
Hemoglobin: 7.1 g/dL — ABNORMAL LOW (ref 13.0–17.0)
Hemoglobin: 7.5 g/dL — ABNORMAL LOW (ref 13.0–17.0)
Immature Granulocytes: 2 %
Immature Granulocytes: 2 %
Immature Granulocytes: 2 %
Lymphocytes Relative: 7 %
Lymphocytes Relative: 7 %
Lymphocytes Relative: 5 %
Lymphs Abs: 1.5 K/uL (ref 0.7–4.0)
Lymphs Abs: 1.5 K/uL (ref 0.7–4.0)
Lymphs Abs: 1.2 K/uL (ref 0.7–4.0)
MCH: 30 pg (ref 26.0–34.0)
MCH: 29.5 pg (ref 26.0–34.0)
MCH: 30.4 pg (ref 26.0–34.0)
MCHC: 32.7 g/dL (ref 30.0–36.0)
MCHC: 31.7 g/dL (ref 30.0–36.0)
MCHC: 31.9 g/dL (ref 30.0–36.0)
MCV: 91.8 fL (ref 80.0–100.0)
MCV: 92.9 fL (ref 80.0–100.0)
MCV: 95.1 fL (ref 80.0–100.0)
Monocytes Absolute: 2.1 K/uL — ABNORMAL HIGH (ref 0.1–1.0)
Monocytes Absolute: 1.8 K/uL — ABNORMAL HIGH (ref 0.1–1.0)
Monocytes Absolute: 2.4 K/uL — ABNORMAL HIGH (ref 0.1–1.0)
Monocytes Relative: 10 %
Monocytes Relative: 8 %
Monocytes Relative: 9 %
Neutro Abs: 17.1 K/uL — ABNORMAL HIGH (ref 1.7–7.7)
Neutro Abs: 18.1 K/uL — ABNORMAL HIGH (ref 1.7–7.7)
Neutro Abs: 22.5 K/uL — ABNORMAL HIGH (ref 1.7–7.7)
Neutrophils Relative %: 81 %
Neutrophils Relative %: 83 %
Neutrophils Relative %: 84 %
Platelets: 195 K/uL (ref 150–400)
Platelets: 251 K/uL (ref 150–400)
Platelets: 273 K/uL (ref 150–400)
RBC: 2.33 MIL/uL — ABNORMAL LOW (ref 4.22–5.81)
RBC: 2.41 MIL/uL — ABNORMAL LOW (ref 4.22–5.81)
RBC: 2.47 MIL/uL — ABNORMAL LOW (ref 4.22–5.81)
RDW: 18.6 % — ABNORMAL HIGH (ref 11.5–15.5)
RDW: 19 % — ABNORMAL HIGH (ref 11.5–15.5)
RDW: 19.9 % — ABNORMAL HIGH (ref 11.5–15.5)
Smear Review: NORMAL
WBC: 21.1 K/uL — ABNORMAL HIGH (ref 4.0–10.5)
WBC: 21.7 K/uL — ABNORMAL HIGH (ref 4.0–10.5)
WBC: 26.6 K/uL — ABNORMAL HIGH (ref 4.0–10.5)
nRBC: 0.2 % (ref 0.0–0.2)
nRBC: 0.1 % (ref 0.0–0.2)
nRBC: 0.1 % (ref 0.0–0.2)

## 2024-05-22 LAB — GLUCOSE, CAPILLARY
Glucose-Capillary: 100 mg/dL — ABNORMAL HIGH (ref 70–99)
Glucose-Capillary: 104 mg/dL — ABNORMAL HIGH (ref 70–99)
Glucose-Capillary: 108 mg/dL — ABNORMAL HIGH (ref 70–99)
Glucose-Capillary: 207 mg/dL — ABNORMAL HIGH (ref 70–99)
Glucose-Capillary: 42 mg/dL — CL (ref 70–99)
Glucose-Capillary: 49 mg/dL — ABNORMAL LOW (ref 70–99)
Glucose-Capillary: 82 mg/dL (ref 70–99)
Glucose-Capillary: 93 mg/dL (ref 70–99)
Glucose-Capillary: 98 mg/dL (ref 70–99)

## 2024-05-22 LAB — BASIC METABOLIC PANEL WITH GFR
Anion gap: 11 (ref 5–15)
BUN: 50 mg/dL — ABNORMAL HIGH (ref 8–23)
CO2: 23 mmol/L (ref 22–32)
Calcium: 7.4 mg/dL — ABNORMAL LOW (ref 8.9–10.3)
Chloride: 106 mmol/L (ref 98–111)
Creatinine, Ser: 1.79 mg/dL — ABNORMAL HIGH (ref 0.61–1.24)
GFR, Estimated: 41 mL/min — ABNORMAL LOW (ref >60)
Glucose, Bld: 112 mg/dL — ABNORMAL HIGH (ref 70–99)
Potassium: 4 mmol/L (ref 3.5–5.1)
Sodium: 140 mmol/L (ref 135–145)

## 2024-05-22 MED ORDER — ROPINIROLE HCL 1 MG PO TABS
1.0000 mg | ORAL_TABLET | Freq: Every day | ORAL | Status: DC
  Administered 2024-05-22 – 2024-05-23 (×3): 1 mg
  Filled 2024-05-22 (×2): qty 1

## 2024-05-22 MED ORDER — DEXTROSE 50 % IV SOLN: 25.0000 g | INTRAVENOUS | Status: AC

## 2024-05-22 MED ORDER — PANTOPRAZOLE SODIUM 40 MG IV SOLR
40.0000 mg | INTRAVENOUS | Status: DC
  Administered 2024-05-23 – 2024-05-24 (×4): 40 mg via INTRAVENOUS
  Filled 2024-05-22 (×2): qty 10

## 2024-05-22 MED ORDER — TERAZOSIN HCL 5 MG PO CAPS
10.0000 mg | ORAL_CAPSULE | Freq: Every day | ORAL | Status: DC
  Administered 2024-05-22 – 2024-05-23 (×3): 10 mg
  Filled 2024-05-22 (×2): qty 2

## 2024-05-22 MED ORDER — DEXTROSE 50 % IV SOLN
INTRAVENOUS | Status: AC
  Administered 2024-05-22: 25 g via INTRAVENOUS
  Filled 2024-05-22: qty 50

## 2024-05-22 MED ORDER — AMLODIPINE BESYLATE 5 MG PO TABS
5.0000 mg | ORAL_TABLET | Freq: Every day | ORAL | Status: DC
  Administered 2024-05-22 – 2024-05-24 (×5): 5 mg
  Filled 2024-05-22 (×3): qty 1

## 2024-05-22 MED ORDER — MAGNESIUM SULFATE 2 GM/50ML IV SOLN
2.0000 g | Freq: Once | INTRAVENOUS | Status: AC
  Administered 2024-05-22: 2 g via INTRAVENOUS
  Filled 2024-05-22: qty 50

## 2024-05-22 NOTE — Progress Notes (Signed)
 NAME:  David Callahan, MRN:  [id number], DOB:  11-07-1955, LOS: 4 ADMISSION DATE:  05/18/2024, CONSULTATION DATE:  05/21/24 REFERRING MD:  Dr Willette, CHIEF COMPLAINT:  Duodenal bleeding ulcer    History of Present Illness:  This is a 68 year old male with a past medical history of COPD, hypertension, PUD presenting with hematemesis and black stools. EGD with large duodenal ulcer. Went to the OR and underwent exploratory laparotomy, control of bleeding duodenal ulcer, Omental patch over duodenotomy closure underwent. He is presenting to the ICU post op  Pertinent  Medical History  COPD HTN PUD   Significant Hospital Events: Including procedures, antibiotic start and stop dates in addition to other pertinent events   05/20/24 exploratory laparotomy, control of bleeding duodenal ulcer, Omental patch over duodenotomy closure underwent. 05/21/24 Extubated   Interim History / Subjective:  Follows commands , has no complaints   Objective    Blood pressure 117/60, pulse 77, temperature 98.9 F (37.2 C), temperature source Oral, resp. rate 18, height 5' 10 (1.778 m), weight 79.2 kg, SpO2 92%.        Intake/Output Summary (Last 24 hours) at 05/22/2024 0831 Last data filed at 05/22/2024 9386 Gross per 24 hour  Intake 481.22 ml  Output 2305 ml  Net -1823.78 ml   Filed Weights   05/18/24 0751 05/20/24 1342 05/20/24 1941  Weight: 79.4 kg 79.4 kg 79.2 kg    Examination: General: elderly male not in distress Lungs: CTAB Cardiovascular: RRR , Nl S1,s2 Abdomen: Soft, nt, nd Extremities: Warm Neuro: motor and sensation intact   Resolved problem list   Assessment and Plan   Neuro  No issues    Pulm #COPD on Incruse at home #Sleep apnea  #Acute hypoxic respiratory failure (improved) - Brovana  and eupleri nebs    Cardiovascular  #Hemorrhagic shock (improved) #HTN - Off pressors  - Trending Hb q6h  - holding home ACE and dilt (AKI and low HR) - Start amlodipine  5mg  and IV  hydral PRN)   GI #Duodenal bleeding ulcer  S/P Ex lap  Per surgery: Gastrografin upper GI study once bowel function returns through NG tube.   PPI BID  - space out hb checks to twice daily  - clear liquid diet  - Type and screen  - 2 large PIV  - Hb stable    Renal  AKI  #BPH resume terzosin  Likely prerenal due to shock  Trend Cr Avoid nephrotoxins  Endo   Glucose levels acceptable   Hem/ONC SCD, no chem dvt ppx    ID No evidence of infection at this time  Anceph for surgical ppx for 48 hours     Best Practice (right click and Reselect all SmartList Selections daily)   Diet/type: NPO w/ oral meds DVT prophylaxis SCD Pressure ulcer(s): pressure ulcer assessment deferred  GI prophylaxis: PPI Lines: Arterial Line Foley:  Yes, and it is still needed Code Status:  full code Last date of multidisciplinary goals of care discussion [NA]  Labs   CBC: Recent Labs  Lab 05/18/24 0447 05/18/24 1151 05/21/24 0255 05/21/24 0803 05/21/24 1400 05/21/24 2204 05/22/24 0157  WBC 16.4*   < > 22.5* 19.5* 21.6* 21.7* 21.1*  NEUTROABS 14.2*  --   --  15.1* 17.4* 17.3* 17.1*  HGB 5.3*   < > 7.8* 7.7* 7.5* 7.3* 7.0*  HCT 16.8*   < > 23.6* 22.6* 23.1* 22.8* 21.4*  MCV 94.9   < > 88.4 87.9 89.9 91.9 91.8  PLT 342   < >  181 180 217 201 195   < > = values in this interval not displayed.    Basic Metabolic Panel: Recent Labs  Lab 05/19/24 0640 05/20/24 0412 05/20/24 1959 05/20/24 2025 05/21/24 0255 05/22/24 0200  NA 138 137 136 137 135 140  K 4.7 4.4 4.6 4.9 5.0 4.0  CL 108 102  --  105 105 106  CO2 23 26  --  24 21* 23  GLUCOSE 126* 109*  --  134* 109* 112*  BUN 46* 52*  --  53* 53* 50*  CREATININE 1.26* 1.45*  --  1.73* 1.94* 1.79*  CALCIUM  7.4* 7.9*  --  7.4* 7.5* 7.4*  MG  --   --   --  1.7 1.7  --   PHOS  --   --   --  5.4* 5.9*  --    GFR: Estimated Creatinine Clearance: 41.3 mL/min (A) (by C-G formula based on SCr of 1.79 mg/dL (H)). Recent Labs   Lab 05/21/24 0803 05/21/24 1400 05/21/24 2204 05/22/24 0157  WBC 19.5* 21.6* 21.7* 21.1*    Liver Function Tests: Recent Labs  Lab 05/18/24 0447 05/20/24 2025  AST 25 16  ALT 20 13  ALKPHOS 68 41  BILITOT 0.4 0.5  PROT 5.0* 3.3*  ALBUMIN  2.4* 1.8*   Recent Labs  Lab 05/18/24 0447  LIPASE 26   No results for input(s): AMMONIA in the last 168 hours.  ABG    Component Value Date/Time   PHART 7.370 05/20/2024 1959   PCO2ART 42.8 05/20/2024 1959   PO2ART 308 (H) 05/20/2024 1959   HCO3 24.7 05/20/2024 1959   TCO2 26 05/20/2024 1959   ACIDBASEDEF 1.0 05/20/2024 1959   O2SAT 100 05/20/2024 1959     Coagulation Profile: Recent Labs  Lab 05/18/24 0447 05/20/24 2040  INR 1.2 1.3*    Cardiac Enzymes: No results for input(s): CKTOTAL, CKMB, CKMBINDEX, TROPONINI in the last 168 hours.  HbA1C: Hgb A1c MFr Bld  Date/Time Value Ref Range Status  06/05/2022 05:03 AM 5.4 4.8 - 5.6 % Final    Comment:    (NOTE) Pre diabetes:          5.7%-6.4%  Diabetes:              >6.4%  Glycemic control for   <7.0% adults with diabetes     CBG: Recent Labs  Lab 05/22/24 0008 05/22/24 0032 05/22/24 0036 05/22/24 0323 05/22/24 0718  GLUCAP 42* 49* 207* 98 100*    Review of Systems:   Negative except as above   Past Medical History:  He,  has a past medical history of Asthma, COPD (chronic obstructive pulmonary disease) (HCC), Heart murmur, Hypertension, and Sleep apnea.   Surgical History:   Past Surgical History:  Procedure Laterality Date   COLONOSCOPY     ESOPHAGOGASTRODUODENOSCOPY N/A 03/10/2024   Procedure: EGD (ESOPHAGOGASTRODUODENOSCOPY);  Surgeon: Cinderella Deatrice FALCON, MD;  [facility];  Service: Endoscopy;  Laterality: N/A;   ESOPHAGOGASTRODUODENOSCOPY N/A 04/06/2024   Procedure: EGD (ESOPHAGOGASTRODUODENOSCOPY);  Surgeon: Shaaron Lamar HERO, MD;  Location[facility];  Service: Endoscopy;  Laterality: N/A;   JOINT REPLACEMENT Left     knee   KNEE SURGERY     LAPAROTOMY N/A 05/20/2024   Procedure: LAPAROTOMY, EXPLORATORY, CONTROL OF BLEEDING DUODENAL ULCER;  Surgeon: Evonnie Dorothyann LABOR, DO;  Location: [facility];  Service: General;  Laterality: N/A;     Social History:   reports that he has been smoking cigarettes and  cigars. He has never used smokeless tobacco. He reports that he does not currently use alcohol. He reports that he does not use drugs.   Family History:  His family history is not on file.   Allergies Allergies  Allergen Reactions   Motrin [Ibuprofen] Anaphylaxis and Swelling    Anti-inflammatory analgesics- cause anaphylaxis     Home Medications  Prior to Admission medications   Medication Sig Start Date End Date Taking? Authorizing Provider  albuterol  (VENTOLIN  HFA) 108 (90 Base) MCG/ACT inhaler Inhale 2 puffs into the lungs every 6 (six) hours as needed for shortness of breath.   Yes [provider]  atorvastatin  (LIPITOR) 40 MG tablet Take 40 mg by mouth daily.   Yes [provider]  benazepril  (LOTENSIN ) 40 MG tablet Take 40 mg by mouth daily. 04/27/24  Yes [provider]  cyclobenzaprine  (FLEXERIL ) 10 MG tablet Take 10 mg by mouth at bedtime. 09/17/20  Yes [provider]  diltiazem  (CARDIZEM  CD) 360 MG 24 hr capsule Take 360 mg by mouth daily. 02/16/24  Yes [provider]  esomeprazole  (NEXIUM ) 40 MG capsule Take 1 capsule (40 mg total) by mouth 2 (two) times daily before a meal. 05/08/24 05/08/25 Yes Dorrell, Lamar, MD  feeding supplement, ENSURE COMPLETE, (ENSURE COMPLETE) LIQD Take 237 mLs by mouth 2 (two) times daily between meals. 03/12/24  Yes Tat, Alm, MD  ferrous sulfate  325 (65 FE) MG tablet Take 325 mg by mouth daily with breakfast.   Yes [provider]  HYDROcodone -acetaminophen  (NORCO) 10-325 MG tablet Take 1 tablet by mouth every 4 (four) hours as needed for moderate pain (pain score 4-6). 02/10/24  Yes [provider]   rOPINIRole  (REQUIP ) 1 MG tablet Take 1 mg by mouth daily. 02/16/24  Yes [provider]  sucralfate  (CARAFATE ) 1 GM/10ML suspension Take 10 mLs (1 g total) by mouth 4 (four) times daily -  with meals and at bedtime for 28 days. 04/07/24 05/18/24 Yes Shah, Pratik D, DO  terazosin  (HYTRIN ) 10 MG capsule Take 10 mg by mouth daily. 02/16/24  Yes [provider]  traZODone  (DESYREL ) 150 MG tablet Take 150 mg by mouth at bedtime as needed for sleep. 02/16/24  Yes [provider]  umeclidinium bromide  (INCRUSE ELLIPTA ) 62.5 MCG/ACT AEPB Inhale 1 puff into the lungs daily. 06/23/22  Yes Bryn Bernardino NOVAK, MD         The patient is critically ill due to life threatening GI bleeding requiring frequent monitoring, weaning off ventilator and extubating .  Critical care was necessary to treat or prevent imminent or life-threatening deterioration. Critical care time was spent by me on the following activities: development of a treatment plan with the patient and/or surrogate as well as nursing, discussions with consultants, evaluation of the patient's response to treatment, examination of the patient, obtaining a history from the patient or surrogate, ordering and performing treatments and interventions, ordering and review of laboratory studies, ordering and review of radiographic studies, review of telemetry data including pulse oximetry, re-evaluation of patient's condition and participation in multidisciplinary rounds.   I personally spent 33  minutes providing critical care not including any separately billable procedures.   Zola LOISE Herter, MD [place] [facility] 05/22/2024 8:31 AM

## 2024-05-22 NOTE — Progress Notes (Signed)
 eLink Physician-Brief Progress Note Patient Name: David Callahan DOB: 1955/11/04 MRN: [id number]   Date of Service  05/22/2024  HPI/Events of Note  Glucose level 40-50 when checked by finger stick.  Hands were very cold.  Repeat from earlobe 207.  eICU Interventions  Confirm by blood draw If confirmed by blood draw that is 207 will use earlobe for checks     Intervention Category Intermediate Interventions: Other:  CLAUDENE AGENT, P 05/22/2024, 12:58 AM

## 2024-05-22 NOTE — Progress Notes (Signed)
 eLink Physician-Brief Progress Note Patient Name: MAMIE DIIORIO DOB: 1956/01/21 MRN: [id number]   Date of Service  05/22/2024  HPI/Events of Note  Notified as pt became tachycardic, HR 150s, narrow complex. Pt reported to be hypotensive as well during this episode.  After a few minutes, pt spontaneously converted back to sinus rhythm, MAP maintaining at 68.  Pt was not in pain/anxious during the event. No febrile episodes reported.   eICU Interventions  Will check H/H now.         Altus Zaino M DELA CRUZ 05/22/2024, 11:44 PM  12:30 AM Pt back in narrow complex tachycardia, appears to be SVT with HR 150s-160s.  Before any intervention can be given, pt spontaneously converted back to sinus.  H/H pending, I/O show pt net negative 6.7 liters since admission.  Will give 1L LR bolus.  Will await H/H, see if pt would require additional volume in the form of pRBC Will continue to monitor closely.

## 2024-05-22 NOTE — Plan of Care (Signed)
  Problem: Education: Goal: Knowledge of General Education information will improve Description: Including pain rating scale, medication(s)/side effects and non-pharmacologic comfort measures Outcome: Not Progressing   Problem: Health Behavior/Discharge Planning: Goal: Ability to manage health-related needs will improve Outcome: Not Progressing   Problem: Clinical Measurements: Goal: Ability to maintain clinical measurements within normal limits will improve Outcome: Not Progressing Goal: Will remain free from infection Outcome: Not Progressing Goal: Diagnostic test results will improve Outcome: Not Progressing Goal: Respiratory complications will improve Outcome: Not Progressing Goal: Cardiovascular complication will be avoided Outcome: Not Progressing   Problem: Activity: Goal: Risk for activity intolerance will decrease Outcome: Not Progressing   Problem: Nutrition: Goal: Adequate nutrition will be maintained Outcome: Not Progressing   Problem: Coping: Goal: Level of anxiety will decrease Outcome: Not Progressing   Problem: Elimination: Goal: Will not experience complications related to bowel motility Outcome: Not Progressing Goal: Will not experience complications related to urinary retention Outcome: Not Progressing   Problem: Pain Managment: Goal: General experience of comfort will improve and/or be controlled Outcome: Not Progressing   Problem: Safety: Goal: Ability to remain free from injury will improve Outcome: Not Progressing   Problem: Skin Integrity: Goal: Risk for impaired skin integrity will decrease Outcome: Not Progressing   Problem: Activity: Goal: Ability to tolerate increased activity will improve Outcome: Not Progressing   Problem: Respiratory: Goal: Ability to maintain a clear airway and adequate ventilation will improve Outcome: Not Progressing   Problem: Role Relationship: Goal: Method of communication will improve Outcome: Not  Progressing

## 2024-05-22 NOTE — Progress Notes (Signed)
 Rhythm change to SVT for over 60 seconds. Rate maintained at 167 consistently . Unable to capture EKG during rate increase. BP 82/59. Temp, pulse 95% on r/a. EKG obtained showed accelerated junctional. Discussed with elink . Will get H/H now . Current BP 102/56, MAP 68

## 2024-05-22 NOTE — Progress Notes (Signed)
 2 Days Post-Op   Subjective/Chief Complaint: No complaints   Objective: Vital signs in last 24 hours: Temp:  [98.4 F (36.9 C)-99.4 F (37.4 C)] 98.9 F (37.2 C) (08/10 0739) Pulse Rate:  [57-84] 78 (08/10 0900) Resp:  [13-21] 15 (08/10 0900) BP: (107-153)/(55-79) 123/63 (08/10 0900) SpO2:  [92 %-100 %] 94 % (08/10 0900) Arterial Line BP: (122-198)/(64-92) 193/65 (08/09 1343) Last BM Date : 05/20/24  Intake/Output from previous day: 08/09 0701 - 08/10 0700 In: 545.4 [I.V.:175.4; NG/GT:270; IV Piggyback:100] Out: 2305 [Urine:1725; Emesis/NG output:500; Drains:80] Intake/Output this shift: Total I/O In: 110 [NG/GT:60; IV Piggyback:50] Out: 350 [Urine:350]  General appearance: alert and cooperative Resp: clear to auscultation bilaterally Cardio: regular rate and rhythm GI: soft, mild tenderness. Ng output bilious now  Lab Results:  Recent Labs    05/21/24 2204 05/22/24 0157  WBC 21.7* 21.1*  HGB 7.3* 7.0*  HCT 22.8* 21.4*  PLT 201 195   BMET Recent Labs    05/21/24 0255 05/22/24 0200  NA 135 140  K 5.0 4.0  CL 105 106  CO2 21* 23  GLUCOSE 109* 112*  BUN 53* 50*  CREATININE 1.94* 1.79*  CALCIUM  7.5* 7.4*   PT/INR Recent Labs    05/20/24 2040  LABPROT 16.5*  INR 1.3*   ABG Recent Labs    05/20/24 1959  PHART 7.370  HCO3 24.7    Studies/Results: DG Chest Port 1 View Result Date: 05/20/2024 CLINICAL DATA:  [id number] Endotracheally intubated [id number] EXAM: PORTABLE CHEST - 1 VIEW COMPARISON:  May 19, 2024 FINDINGS: Esophagogastric tube terminates in the mid trachea. Esophagogastric tube courses below the diaphragm terminating outside the field of view. The last side hole is at the gastric cardia. No focal airspace consolidation, pleural effusion, or pneumothorax. No cardiomegaly. No acute fracture or destructive lesions. Multilevel thoracic osteophytosis. IMPRESSION: Well-positioned endotracheal tube. Esophagogastric tube courses below the diaphragm  with the distal tip not included in the field of view. Electronically Signed   By: Rogelia Myers M.D.   On: 05/20/2024 18:50    Anti-infectives: Anti-infectives (From admission, onward)    Start     Dose/Rate Route Frequency Ordered Stop   05/20/24 2200  ceFAZolin  (ANCEF ) IVPB 1 g/50 mL premix        1 g 100 mL/hr over 30 Minutes Intravenous Every 8 hours 05/20/24 1938 05/22/24 2159   05/20/24 1607  ceFAZolin  (ANCEF ) 2-4 GM/100ML-% IVPB       Note to Pharmacy: Cordella Area M: cabinet override      05/20/24 1607 05/21/24 0414       Assessment/Plan: s/p Procedure(s): LAPAROTOMY, EXPLORATORY, CONTROL OF BLEEDING DUODENAL ULCER (N/A) Continue ng and bowel rest. Will need UGI once bowel function returns before removing ng Hg stable Continue postop abx Will follow  LOS: 4 days    Deward Null III 05/22/2024

## 2024-05-23 ENCOUNTER — Inpatient Hospital Stay (HOSPITAL_COMMUNITY)

## 2024-05-23 ENCOUNTER — Other Ambulatory Visit: Payer: Self-pay

## 2024-05-23 DIAGNOSIS — J449 Chronic obstructive pulmonary disease, unspecified: Secondary | ICD-10-CM | POA: Diagnosis not present

## 2024-05-23 DIAGNOSIS — N179 Acute kidney failure, unspecified: Secondary | ICD-10-CM | POA: Diagnosis not present

## 2024-05-23 DIAGNOSIS — D500 Iron deficiency anemia secondary to blood loss (chronic): Secondary | ICD-10-CM

## 2024-05-23 DIAGNOSIS — K264 Chronic or unspecified duodenal ulcer with hemorrhage: Secondary | ICD-10-CM | POA: Diagnosis not present

## 2024-05-23 LAB — CBC WITH DIFFERENTIAL/PLATELET
Abs Immature Granulocytes: 0.34 K/uL — ABNORMAL HIGH (ref 0.00–0.07)
Basophils Absolute: 0 K/uL (ref 0.0–0.1)
Basophils Absolute: 0.1 K/uL (ref 0.0–0.1)
Basophils Relative: 0 %
Basophils Relative: 0 %
Eosinophils Absolute: 0 K/uL (ref 0.0–0.5)
Eosinophils Absolute: 0 K/uL (ref 0.0–0.5)
Eosinophils Relative: 0 %
Eosinophils Relative: 0 %
HCT: 21.9 % — ABNORMAL LOW (ref 39.0–52.0)
HCT: 22.9 % — ABNORMAL LOW (ref 39.0–52.0)
Hemoglobin: 6.9 g/dL — CL (ref 13.0–17.0)
Hemoglobin: 7.4 g/dL — ABNORMAL LOW (ref 13.0–17.0)
Immature Granulocytes: 1 %
Lymphocytes Relative: 0 %
Lymphocytes Relative: 5 %
Lymphs Abs: 0 K/uL — ABNORMAL LOW (ref 0.7–4.0)
Lymphs Abs: 1.5 K/uL (ref 0.7–4.0)
MCH: 30.1 pg (ref 26.0–34.0)
MCH: 30.5 pg (ref 26.0–34.0)
MCHC: 31.5 g/dL (ref 30.0–36.0)
MCHC: 32.3 g/dL (ref 30.0–36.0)
MCV: 95.6 fL (ref 80.0–100.0)
MCV: 94.2 fL (ref 80.0–100.0)
Monocytes Absolute: 1.2 K/uL — ABNORMAL HIGH (ref 0.1–1.0)
Monocytes Absolute: 1.7 K/uL — ABNORMAL HIGH (ref 0.1–1.0)
Monocytes Relative: 4 %
Monocytes Relative: 6 %
Neutro Abs: 29.3 K/uL — ABNORMAL HIGH (ref 1.7–7.7)
Neutro Abs: 26.4 K/uL — ABNORMAL HIGH (ref 1.7–7.7)
Neutrophils Relative %: 96 %
Neutrophils Relative %: 88 %
Platelets: 332 K/uL (ref 150–400)
Platelets: 325 K/uL (ref 150–400)
RBC: 2.29 MIL/uL — ABNORMAL LOW (ref 4.22–5.81)
RBC: 2.43 MIL/uL — ABNORMAL LOW (ref 4.22–5.81)
RDW: 20.5 % — ABNORMAL HIGH (ref 11.5–15.5)
RDW: 18.8 % — ABNORMAL HIGH (ref 11.5–15.5)
WBC: 30.5 K/uL — ABNORMAL HIGH (ref 4.0–10.5)
WBC: 29.9 K/uL — ABNORMAL HIGH (ref 4.0–10.5)
nRBC: 0.1 % (ref 0.0–0.2)
nRBC: 0 % (ref 0.0–0.2)

## 2024-05-23 LAB — BPAM RBC
Blood Product Expiration Date: 202509022359
Blood Product Expiration Date: 202509022359
Blood Product Expiration Date: 202509022359
Blood Product Expiration Date: 202509072359
Blood Product Expiration Date: 202509072359
Blood Product Expiration Date: 202509132359
Blood Product Expiration Date: 202509132359
Blood Product Expiration Date: 202509142359
Blood Product Expiration Date: 202509142359
Blood Product Expiration Date: 202509142359
Blood Product Expiration Date: 202508272359
Blood Product Expiration Date: 202509022359
Blood Product Expiration Date: 202509072359
Blood Product Expiration Date: 202509072359
Blood Product Expiration Date: 202509072359
Blood Product Expiration Date: 202509132359
Blood Product Expiration Date: 202509132359
Blood Product Unit Number: 202509142359
ISSUE DATE / TIME: 202508060620
ISSUE DATE / TIME: 202508061511
ISSUE DATE / TIME: 202508061758
ISSUE DATE / TIME: 202508070356
ISSUE DATE / TIME: 202508081056
ISSUE DATE / TIME: 202508081306
ISSUE DATE / TIME: 202508081522
ISSUE DATE / TIME: 202508081522
ISSUE DATE / TIME: 202508060902
ISSUE DATE / TIME: 202508070118
ISSUE DATE / TIME: 202508070827
ISSUE DATE / TIME: 202508071119
ISSUE DATE / TIME: 202508081522
ISSUE DATE / TIME: 202508081522
PRODUCT CODE: 202508101724
Unit Type and Rh: 5100
Unit Type and Rh: 5100
Unit Type and Rh: 5100
Unit Type and Rh: 5100
Unit Type and Rh: 5100
Unit Type and Rh: 5100
Unit Type and Rh: 5100
Unit Type and Rh: 5100
Unit Type and Rh: 202509132359
Unit Type and Rh: 202509142359
Unit Type and Rh: 5100
Unit Type and Rh: 5100
Unit Type and Rh: 5100
Unit Type and Rh: 5100
Unit Type and Rh: 5100
Unit Type and Rh: 5100
Unit Type and Rh: 5100
Unit Type and Rh: 5100
Unit Type and Rh: 5100
Unit Type and Rh: 5100
Unit Type and Rh: 5100
Unit Type and Rh: 5100

## 2024-05-23 LAB — TYPE AND SCREEN
ABO/RH(D): O POS
Antibody Screen: NEGATIVE
Unit division: 0
Unit division: 0
Unit division: 0
Unit division: 0
Unit division: 0
Unit division: 0
Unit division: 0
Unit division: 0
Unit division: 0
Unit division: 0
Unit division: 0
Unit division: 0
Unit division: 0
Unit division: 0
Unit division: 0
Unit division: 0
Unit division: 0
Unit division: 0

## 2024-05-23 LAB — BASIC METABOLIC PANEL WITH GFR
Anion gap: 12 (ref 5–15)
BUN: 34 mg/dL — ABNORMAL HIGH (ref 8–23)
CO2: 21 mmol/L — ABNORMAL LOW (ref 22–32)
Calcium: 7.4 mg/dL — ABNORMAL LOW (ref 8.9–10.3)
Chloride: 104 mmol/L (ref 98–111)
Creatinine, Ser: 1.24 mg/dL (ref 0.61–1.24)
GFR, Estimated: >60 mL/min (ref >60)
Glucose, Bld: 94 mg/dL (ref 70–99)
Potassium: 4 mmol/L (ref 3.5–5.1)
Sodium: 137 mmol/L (ref 135–145)

## 2024-05-23 LAB — HEPATIC FUNCTION PANEL
ALT: 9 U/L (ref 0–44)
AST: 16 U/L (ref 15–41)
Albumin: <1.5 g/dL — ABNORMAL LOW (ref 3.5–5.0)
Alkaline Phosphatase: 43 U/L (ref 38–126)
Bilirubin, Direct: 0.2 mg/dL (ref 0.0–0.2)
Indirect Bilirubin: 0.4 mg/dL (ref 0.3–0.9)
Total Bilirubin: 0.6 mg/dL (ref 0.0–1.2)
Total Protein: 4.6 g/dL — ABNORMAL LOW (ref 6.5–8.1)

## 2024-05-23 LAB — URINALYSIS, COMPLETE (UACMP) WITH MICROSCOPIC
Bilirubin Urine: NEGATIVE
Glucose, UA: NEGATIVE mg/dL
Ketones, ur: NEGATIVE mg/dL
Leukocytes,Ua: NEGATIVE
Nitrite: NEGATIVE
Protein, ur: NEGATIVE mg/dL
Specific Gravity, Urine: 1.009 (ref 1.005–1.030)
pH: 5 (ref 5.0–8.0)

## 2024-05-23 LAB — PHOSPHORUS: Phosphorus: 3.3 mg/dL (ref 2.5–4.6)

## 2024-05-23 LAB — GLUCOSE, CAPILLARY
Glucose-Capillary: 104 mg/dL — ABNORMAL HIGH (ref 70–99)
Glucose-Capillary: 101 mg/dL — ABNORMAL HIGH (ref 70–99)
Glucose-Capillary: 99 mg/dL (ref 70–99)
Glucose-Capillary: 101 mg/dL — ABNORMAL HIGH (ref 70–99)
Glucose-Capillary: 112 mg/dL — ABNORMAL HIGH (ref 70–99)
Glucose-Capillary: 90 mg/dL (ref 70–99)

## 2024-05-23 LAB — HEMOGLOBIN AND HEMATOCRIT, BLOOD
HCT: 22.2 % — ABNORMAL LOW (ref 39.0–52.0)
Hemoglobin: 7 g/dL — ABNORMAL LOW (ref 13.0–17.0)

## 2024-05-23 LAB — MASSIVE TRANSFUSION PROTOCOL ORDER (BLOOD BANK NOTIFICATION)

## 2024-05-23 LAB — MAGNESIUM: Magnesium: 2.2 mg/dL (ref 1.7–2.4)

## 2024-05-23 LAB — PREPARE RBC (CROSSMATCH)

## 2024-05-23 MED ORDER — FUROSEMIDE 10 MG/ML IJ SOLN
20.0000 mg | Freq: Once | INTRAMUSCULAR | Status: AC
  Administered 2024-05-23 (×2): 20 mg via INTRAVENOUS
  Filled 2024-05-23: qty 2

## 2024-05-23 MED ORDER — CHLORHEXIDINE GLUCONATE CLOTH 2 % EX PADS
6.0000 | MEDICATED_PAD | Freq: Every day | CUTANEOUS | Status: DC
  Administered 2024-05-23 – 2024-05-24 (×4): 6 via TOPICAL

## 2024-05-23 MED ORDER — LACTATED RINGERS IV BOLUS
1000.0000 mL | Freq: Once | INTRAVENOUS | Status: AC
  Administered 2024-05-23 (×2): 1000 mL via INTRAVENOUS

## 2024-05-23 MED ORDER — SODIUM CHLORIDE 0.9% IV SOLUTION: Freq: Once | INTRAVENOUS | Status: AC

## 2024-05-23 MED ORDER — IOHEXOL 300 MG/ML  SOLN
100.0000 mL | Freq: Once | INTRAMUSCULAR | Status: AC | PRN
  Administered 2024-05-23 (×2): 100 mL

## 2024-05-23 NOTE — Evaluation (Addendum)
 Physical Therapy Evaluation Patient Details Name: David Callahan MRN: [id number] DOB: [birth year] Today's Date: 05/23/2024  History of Present Illness  Patient is a 68 yo male presenting to the ED with hematemesis and bloody stools on 05/18/24. EGD on 8/8 showing large duodenal ulcer. Ex-lap, control of bleeding duodenal ulcer, and omental patch over duodenotomy completed on 8/9. PMH includes: COPD, hypertension, PUD, RLS, anxiety.  Clinical Impression  Patient presents with decreased mobility due to generalized weakness, R knee pain and limited ROM and decreased cardiorespiratory endurance.  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  Currently min A for most mobility with +2 today due to excessive lines and for chair follow for safety with HR elevation to 120's.  Feel he will progress well with aggressive acute level skilled therapies and follow up HHPT at d/c.  States has many friends and his ex-wife who can provide assistance as needed.   Of note his R knee remains extended throughout transitions and is painful which limits him.  Hopeful to have work up for treatment to improve tolerance.       If plan is discharge home, recommend the following: A little help with walking and/or transfers;A little help with bathing/dressing/bathroom;Help with stairs or ramp for entrance;Assist for transportation   Can travel by private vehicle        Equipment Recommendations Rolling walker (2 wheels)  Recommendations for Other Services       Functional Status Assessment Patient has had a recent decline in their functional status and demonstrates the ability to make significant improvements in function in a reasonable and predictable amount of time.     Precautions / Restrictions Precautions Precautions: Fall Precaution/Restrictions Comments: watch HR, NGT clamped today; R JP Drain      Mobility  Bed Mobility Overal bed mobility: Needs Assistance Bed  Mobility: Supine to Sit     Supine to sit: Contact guard, HOB elevated     General bed mobility comments: increased time, assist for lines    Transfers Overall transfer level: Needs assistance Equipment used: Rolling walker (2 wheels) Transfers: Sit to/from Stand Sit to Stand: Mod assist, From elevated surface           General transfer comment: using L LE as R knee not flexed enough    Ambulation/Gait Ambulation/Gait assistance: Min assist, +2 safety/equipment Gait Distance (Feet): 50 Feet Assistive device: Rolling walker (2 wheels) Gait Pattern/deviations: Step-to pattern, Antalgic, Decreased stride length, Decreased stance time - right, Trunk flexed       General Gait Details: cues for R foot flat and achieved over time though reports increased R knee pain, noted L hip crepitus at times with ambulation; encouraged to rest after HR up to 123  Stairs            Wheelchair Mobility     Tilt Bed    Modified Rankin (Stroke Patients Only)       Balance Overall balance assessment: Needs assistance   Sitting balance-Leahy Scale: Good     Standing balance support: Bilateral upper extremity supported Standing balance-Leahy Scale: Poor                               Pertinent Vitals/Pain Pain Assessment Pain Assessment: 0-10 Pain Score: 5  Pain Location: knees Pain Descriptors / Indicators: Aching Pain Intervention(s): Monitored during session    Home Living Family/patient expects to be discharged to::  Private residence Living Arrangements: Alone Available Help at Discharge: Friend(s);Available PRN/intermittently Type of Home: House Home Access: Stairs to enter Entrance Stairs-Rails: Right;Left Entrance Stairs-Number of Steps: 3   Home Layout: One level;Laundry or work area in [employer] Equipment: Tub bench;Cane - single point Additional Comments: ex-wife is an Charity fundraiser and can help intermittently, states has 100's of friends he can call  to help    Prior Function Prior Level of Function : Independent/Modified Independent               ADLs Comments: works for [employer] as Conservation officer, nature at [employer]     Extremity/Trunk Assessment   Upper Extremity Assessment Upper Extremity Assessment: Defer to OT evaluation    Lower Extremity Assessment Lower Extremity Assessment: RLE deficits/detail;LLE deficits/detail RLE Deficits / Details: reports numbness in R foot to touch knee flexion limited and painful with obvious arthritic changes and edema; lifts leg unaided and ankle ROM WFL RLE Sensation: decreased light touch LLE Deficits / Details: AROM/strength WFL    Cervical / Trunk Assessment Cervical / Trunk Assessment: Kyphotic  Communication   Communication Communication: No apparent difficulties    Cognition Arousal: Alert Behavior During Therapy: WFL for tasks assessed/performed   PT - Cognitive impairments: No apparent impairments                         Following commands: Intact       Cueing Cueing Techniques: Verbal cues     General Comments General comments (skin integrity, edema, etc.): VSS except HR elevation 120's with ambulation    Exercises     Assessment/Plan    PT Assessment Patient needs continued PT services  PT Problem List Decreased strength;Decreased activity tolerance;Decreased balance;Decreased mobility;Decreased range of motion;Pain;Cardiopulmonary status limiting activity;Decreased knowledge of use of DME       PT Treatment Interventions DME instruction;Gait training;Functional mobility training;Therapeutic activities;Therapeutic exercise;Balance training;Patient/family education    PT Goals (Current goals can be found in the Care Plan section)  Acute Rehab PT Goals Patient Stated Goal: to go home PT Goal Formulation: With patient Time For Goal Achievement: 06/06/24 Potential to Achieve Goals: Good    Frequency Min 3X/week     Co-evaluation  PT/OT/SLP Co-Evaluation/Treatment: Yes Reason for Co-Treatment: Complexity of the patient's impairments (multi-system involvement);For patient/therapist safety;To address functional/ADL transfers PT goals addressed during session: Mobility/safety with mobility;Balance;Proper use of DME         AM-PAC PT 6 Clicks Mobility  Outcome Measure Help needed turning from your back to your side while in a flat bed without using bedrails?: A Little Help needed moving from lying on your back to sitting on the side of a flat bed without using bedrails?: A Little Help needed moving to and from a bed to a chair (including a wheelchair)?: A Little Help needed standing up from a chair using your arms (e.g., wheelchair or bedside chair)?: A Little Help needed to walk in hospital room?: A Little Help needed climbing 3-5 steps with a railing? : Total 6 Click Score: 16    End of Session Equipment Utilized During Treatment: Gait belt Activity Tolerance: Patient limited by fatigue Patient left: in chair;with call bell/phone within reach;with chair alarm set Nurse Communication: Mobility status PT Visit Diagnosis: Other abnormalities of gait and mobility (R26.89);Muscle weakness (generalized) (M62.81)    Time: 8975-8944 PT Time Calculation (min) (ACUTE ONLY): 31 min   Charges:   PT Evaluation $PT Eval Moderate Complexity: 1 Mod  PT General Charges $$ ACUTE PT VISIT: 1 Visit         Micheline Portal, PT Acute Rehabilitation Services [phone] 05/23/2024   Montie Portal 05/23/2024, 12:54 PM

## 2024-05-23 NOTE — Evaluation (Signed)
 Occupational Therapy Evaluation Patient Details Name: David Callahan MRN: [id number] DOB: [birth year] Today's Date: 05/23/2024   History of Present Illness   Patient is a 68 yo male presenting to the ED with hematemesis and bloody stools on 05/18/24. EGD on 8/8 showing large duodenal ulcer. Ex-lap, control of bleeding duodenal ulcer, and omental patch over duodenotomy completed on 8/9. PMH includes: COPD, hypertension, PUD, RLS, anxiety.     Clinical Impressions Proir to this admission, patient living alone, working, and driving. Patient reports he can has family and friends assist him at home if needed. Currently, patient with need for min-mod A for ADL management, with noted edema in all extremities, affecting functional mobility and overall independence. Decreased grip strength noted. Patient also with R knee pain and increased flexion when ambulating but progressing to full extension with increased time. Patient with HR up to 126 with mobility. OT recommending HHOT at discharge, OT will continue to follow.      If plan is discharge home, recommend the following:   A little help with walking and/or transfers;A little help with bathing/dressing/bathroom;Assist for transportation;Help with stairs or ramp for entrance (initially)     Functional Status Assessment   Patient has had a recent decline in their functional status and demonstrates the ability to make significant improvements in function in a reasonable and predictable amount of time.     Equipment Recommendations   None recommended by OT     Recommendations for Other Services         Precautions/Restrictions   Precautions Precautions: Fall Precaution/Restrictions Comments: watch HR, NGT clamped today; R JP Drain Restrictions Weight Bearing Restrictions Per Provider Order: No     Mobility Bed Mobility Overal bed mobility: Needs Assistance Bed Mobility: Supine to Sit     Supine to sit: Contact guard, HOB  elevated     General bed mobility comments: increased time, assist for lines    Transfers Overall transfer level: Needs assistance Equipment used: Rolling walker (2 wheels) Transfers: Sit to/from Stand Sit to Stand: Mod assist, From elevated surface           General transfer comment: using L LE as R knee not flexed enough      Balance Overall balance assessment: Needs assistance   Sitting balance-Leahy Scale: Good     Standing balance support: Bilateral upper extremity supported Standing balance-Leahy Scale: Poor                             ADL either performed or assessed with clinical judgement   ADL Overall ADL's : Needs assistance/impaired Eating/Feeding: Set up;Sitting   Grooming: Set up;Sitting   Upper Body Bathing: Minimal assistance;Sitting   Lower Body Bathing: Moderate assistance;Sitting/lateral leans;Sit to/from stand   Upper Body Dressing : Minimal assistance;Sitting   Lower Body Dressing: Moderate assistance;Sitting/lateral leans;Sit to/from stand   Toilet Transfer: Minimal assistance;Ambulation;Rolling walker (2 wheels)   Toileting- Clothing Manipulation and Hygiene: Minimal assistance;Sitting/lateral lean;Sit to/from stand Toileting - Clothing Manipulation Details (indicate cue type and reason): for thoroughness     Functional mobility during ADLs: Minimal assistance;Moderate assistance;Cueing for safety;Cueing for sequencing;Rolling walker (2 wheels) General ADL Comments: Proir to this admission, patient living alone, working, and driving. Patient reports he can has family and friends assist him at home if needed. Currently, patient with need for min-mod A for ADL management, with noted edema in all extremities, affecting functional mobility and overall independence. Decreased grip strength noted.  Patient also with R knee pain and increased flexion when ambulating but progressing to full extension with increased time. Patient with HR up  to 126 with mobility. OT recommending HHOT at discharge, OT will continue to follow.     Vision Baseline Vision/History: 0 No visual deficits Ability to See in Adequate Light: 0 Adequate Patient Visual Report: No change from baseline Vision Assessment?: No apparent visual deficits     Perception Perception: Not tested       Praxis Praxis: Not tested       Pertinent Vitals/Pain Pain Assessment Pain Assessment: 0-10 Pain Score: 5  Pain Location: knees Pain Descriptors / Indicators: Aching Pain Intervention(s): Limited activity within patient's tolerance, Monitored during session, Repositioned     Extremity/Trunk Assessment Upper Extremity Assessment Upper Extremity Assessment: Left hand dominant;Right hand dominant;Generalized weakness;RUE deficits/detail;LUE deficits/detail RUE Deficits / Details: edematous affecting grip R>L RUE Sensation: WNL RUE Coordination: decreased gross motor;decreased fine motor LUE Deficits / Details: edematous affecting grip R>L LUE Sensation: WNL LUE Coordination: decreased fine motor;decreased gross motor   Lower Extremity Assessment Lower Extremity Assessment: RLE deficits/detail;LLE deficits/detail RLE Deficits / Details: reports numbness in R foot to touch knee flexion limited and painful with obvious arthritic changes and edema; lifts leg unaided and ankle ROM WFL RLE Sensation: decreased light touch LLE Deficits / Details: AROM/strength WFL   Cervical / Trunk Assessment Cervical / Trunk Assessment: Kyphotic   Communication Communication Communication: No apparent difficulties   Cognition Arousal: Alert Behavior During Therapy: WFL for tasks assessed/performed Cognition: No apparent impairments             OT - Cognition Comments: cues for pacing, but does not appear to have a cognitive component                 Following commands: Intact       Cueing  General Comments   Cueing Techniques: Verbal cues  HR up to  126 with ambulation   Exercises     Shoulder Instructions      Home Living Family/patient expects to be discharged to:: Private residence Living Arrangements: Alone Available Help at Discharge: Friend(s);Available PRN/intermittently Type of Home: House Home Access: Stairs to enter [employer] of Steps: 3 Entrance Stairs-Rails: Right;Left Home Layout: One level;Laundry or work area in basement     [employer] Shower/Tub: Chief Strategy Officer: Standard     Home Equipment: Tub bench;Cane - single point   Additional Comments: ex-wife is an Charity fundraiser and can help intermittently, states has 100's of friends he can call to help      Prior Functioning/Environment Prior Level of Function : Independent/Modified Independent;Working/employed;Driving             Mobility Comments: independent ADLs Comments: works for [employer] as Conservation officer, nature at [employer]    OT Problem List: Decreased strength;Decreased range of motion;Decreased activity tolerance;Impaired balance (sitting and/or standing);Decreased coordination;Decreased safety awareness;Cardiopulmonary status limiting activity;Increased edema;Impaired UE functional use;Pain   OT Treatment/Interventions: Self-care/ADL training;Therapeutic exercise;Energy conservation;DME and/or AE instruction;Therapeutic activities;Patient/family education;Balance training      OT Goals(Current goals can be found in the care plan section)   Acute Rehab OT Goals Patient Stated Goal: to get better OT Goal Formulation: With patient Time For Goal Achievement: 06/06/24 Potential to Achieve Goals: Good ADL Goals Pt Will Perform Lower Body Bathing: with modified independence;sitting/lateral leans;sit to/from stand Pt Will Perform Lower Body Dressing: with modified independence;sit to/from stand;sitting/lateral leans Pt Will Transfer to Toilet: with modified  independence;regular height toilet;ambulating Pt Will Perform  Toileting - Clothing Manipulation and hygiene: with modified independence;sit to/from stand;sitting/lateral leans Pt/caregiver will Perform Home Exercise Program: Increased strength;Both right and left upper extremity;With theraband;With written HEP provided;Independently Additional ADL Goal #1: Patient will be able to complete functional task in standing for 5-7 minutes to increase overall activity tolerance.   OT Frequency:  Min 2X/week    Co-evaluation   Reason for Co-Treatment: Complexity of the patient's impairments (multi-system involvement);For patient/therapist safety;To address functional/ADL transfers PT goals addressed during session: Mobility/safety with mobility;Balance;Proper use of DME        AM-PAC OT 6 Clicks Daily Activity     Outcome Measure Help from another person eating meals?: A Little Help from another person taking care of personal grooming?: A Little Help from another person toileting, which includes using toliet, bedpan, or urinal?: A Lot Help from another person bathing (including washing, rinsing, drying)?: A Lot Help from another person to put on and taking off regular upper body clothing?: A Little Help from another person to put on and taking off regular lower body clothing?: A Lot 6 Click Score: 15   End of Session Equipment Utilized During Treatment: Gait belt;Rolling walker (2 wheels) Nurse Communication: Mobility status  Activity Tolerance: Patient tolerated treatment well Patient left: in chair;with call bell/phone within reach;with chair alarm set  OT Visit Diagnosis: Unsteadiness on feet (R26.81);Other abnormalities of gait and mobility (R26.89);Muscle weakness (generalized) (M62.81);Pain Pain - Right/Left: Right Pain - part of body: Knee                Time: 1025-1056 OT Time Calculation (min): 31 min Charges:  OT General Charges $OT Visit: 1 Visit OT Evaluation $OT Eval Moderate Complexity: 1 Mod  David Callahan, OTR/L Acute  Rehabilitation Services [phone]   David Gift Salt 05/23/2024, 3:34 PM

## 2024-05-23 NOTE — TOC Progression Note (Signed)
 Transition of Care [facility]) - Progression Note    Patient Details  Name: David Callahan MRN: [id number] Date of Birth: October 05, 1956  Transition of [facility]) CM/SW Contact  Lauraine FORBES Saa, LCSWA Phone Number: 05/23/2024, 11:56 AM  Clinical Narrative:     11:56 AM CSW introduced self and role to patient. CSW followed up on SDOH needs (social connections) and offered resources. Patient declined CSW offer of social connections resources. Patient confirmed that he resides at home alone but works 40 hours a week at [employer]. TOC will continue to follow and be available to assist.  Expected Discharge Plan: Home/Self Care Barriers to Discharge: Continued Medical Work up               Expected Discharge Plan and Services In-house Referral: Clinical Social Work Discharge Planning Services: CM Consult   Living arrangements for the past 2 months: Single Family Home                                       Social Drivers of Health (SDOH) Interventions SDOH Screenings   Food Insecurity: No Food Insecurity (05/18/2024)  Housing: Low Risk  (05/18/2024)  Transportation Needs: No Transportation Needs (05/18/2024)  Utilities: Not At Risk (05/18/2024)  Social Connections: Socially Isolated (05/18/2024)  Tobacco Use: High Risk (05/20/2024)    Readmission Risk Interventions    05/18/2024    9:09 AM 04/07/2024    9:51 AM 04/05/2024   11:34 AM  Readmission Risk Prevention Plan  Transportation Screening Complete Complete Complete  Home Care Screening  Complete   Medication Review (RN CM)  Complete   HRI or Home Care Consult Complete  Complete  Social Work Consult for Recovery Care Planning/Counseling Complete  Complete  Palliative Care Screening Not Applicable  Not Applicable  Medication Review Oceanographer) Complete  Complete

## 2024-05-23 NOTE — Progress Notes (Signed)
 PROGRESS NOTE    David Callahan  FMW:[id number] DOB: April 20, 1956 DOA: 05/18/2024 PCP: [name], NP   Brief Narrative:  David Callahan is a 68 y.o. male with medical history significant for COPD, hypertension, and peptic ulcer disease who presents with hematemesis, black stools, and exertional dyspnea.  Found to have a duodenal ulcer on EGD but this could not be stopped with bleeding so went to the OR emergently and underwent exploratory laparotomy to control his duodenal bleeding ulcer and had an omental patch over the do ectomy.  He is postop and had to be transferred to the unit and was intubated and subsequently extubated on 05/21/2024. Transferred to [facility] on 05/23/24.  Blood count is continued to drop slowly so he is already status post 12 units of PRBCs and will give another additional 2 units.  Of note WBC also continues to worsen so we will work him up for infectious etiology.  Assessment and Plan:    Acute upper GI bleed and Blood Loss Anemia / Hemorrhagic Shock; Symptomatic Anemia: GI consulted due to his peptic ulcer disease presented with hematemesis and black stools.  He had an EGD done which showed a large duodenal ulcer and subsequently distal not able to be stopped so he went to the OR and underwent exploratory laparotomy and controlling of his duodenal bleeding ulcer and had omental patch over his duodenectomy closure.  He went to the ICU postoperatively and has been transferred to [facility] service on 05/23/2024.  Blood count continues to be drop and current Trend: Recent Labs  Lab 05/21/24 1400 05/21/24 2204 05/22/24 0157 05/22/24 1048 05/22/24 1844 05/23/24 0040 05/23/24 1423  HGB 7.5* 7.3* 7.0* 7.1* 7.5* 7.0* 6.9*  HCT 23.1* 22.8* 21.4* 22.4* 23.5* 22.2* 21.9*  MCV 89.9 91.9 91.8 92.9 95.1  --  95.6  -Type and Screen and Transfuse 2 units of pRBCs today. He is already s/p 12 units  - NPO and bowel rest. Has NG and surgery following and recommends that he will need  upper GI series once bowel function returns before removing his NG.  Leukocytosis: Worsening. WBC Trend:  Recent Labs  Lab 05/21/24 0803 05/21/24 1400 05/21/24 2204 05/22/24 0157 05/22/24 1048 05/22/24 1844 05/23/24 1423  WBC 19.5* 21.6* 21.7* 21.1* 21.7* 26.6* 30.5*  -Continue monitoring trend and consider repeating CT scan of the abdomen and pelvis given that it continues to worsen in the interim we will check blood cultures x 2, chest x-ray and urinalysis  AKI / Metabolic Acidosis: Improved. BUN/Cr Trend: Recent Labs  Lab 05/18/24 0447 05/19/24 0640 05/20/24 0412 05/20/24 2025 05/21/24 0255 05/22/24 0200 05/23/24 0040  BUN 52* 46* 52* 53* 53* 50* 34*  CREATININE 1.36* 1.26* 1.45* 1.73* 1.94* 1.79* 1.24  -Has a slight metabolic acidosis with a CO2 21, anion gap of 12, chloride level 104 -Avoid Nephrotoxic Medications, Contrast Dyes, Hypotension and Dehydration to Ensure Adequate Renal Perfusion and will need to Renally Adjust Meds -Continue to Monitor and Trend Renal Function carefully and repeat CMP in the AM   BPH: Terazosin  10 mg per Tube daily has been resumed  HLD: C/w Atorvastatin  40 mg per Tube Daily   Essential Hypertension: Was hypotensive on admission due to shock and Antihypertensives were held but blood pressures improved. Now Continuing Amlodipine  5 mg p.o. daily. C/w IV Hydralazine  10 mg q4hprn for SBP >180. CTM BP per Protocol. Last BP reading was 124/84   COPD: Not in exacerbation. On Incruse Ellipta  and as-needed short-acting bronchodilators.  C/w Arformoterol  15 mcg neb BID and Revefenacin  175 mcg Neb Daily. C/w  DuoNeb 3 mL Neb q6hprn Wheezing. Repeat CXR in the AM  GERD/GI Prophylaxis: C/w IV PP w/ Pantoprazole  40 mg q24h   Hypoalbuminemia: Patient's Albumin  Lvl went from 3.0 -> 2.4 -> 1.8 -> <1.5. CTM and Trend and repeat CMP in the AM  Overweight: Complicates overall prognosis and care. Estimated body mass index is 25.05 kg/m as calculated from the  following:   Height as of this encounter: 5' 10 (1.778 m).   Weight as of this encounter: 79.2 kg. Weight Loss and Dietary Counseling given    DVT prophylaxis: SCDs Start: 05/18/24 0605    Code Status: Full Code Family Communication: No family currently at bedside  Disposition Plan:  Level of care: Progressive Status is: Inpatient Remains inpatient appropriate because: Further clinical improvement and clearance by the specialists   Consultants:  General Surgery  Gastroenterology PCCM Transfer  Procedures:  As delineated as above  Antimicrobials:  Anti-infectives (From admission, onward)    Start     Dose/Rate Route Frequency Ordered Stop   05/20/24 2200  ceFAZolin  (ANCEF ) IVPB 1 g/50 mL premix        1 g 100 mL/hr over 30 Minutes Intravenous Every 8 hours 05/20/24 1938 05/22/24 1411   05/20/24 1607  ceFAZolin  (ANCEF ) 2-4 GM/100ML-% IVPB       Note to Pharmacy: Cordella Area M: cabinet override      05/20/24 1607 05/21/24 0414       Subjective: Seen and examined at bedside and he is sitting up in the ICU and is doing okay.  No nausea or vomiting.  Feels okay.  No other concerns or concerns at this time.   Objective: Vitals:   05/23/24 1641 05/23/24 1647 05/23/24 1702 05/23/24 1855  BP: 126/62  (!) 119/53 124/84  Pulse: 76 71 72 87  Resp: 17 14 15 19   Temp: 97.7 F (36.5 C)  98.5 F (36.9 C) (P) 98.7 F (37.1 C)  TempSrc: Oral  Oral (P) Oral  SpO2: 100% 99% 99%   Weight:      Height:        Intake/Output Summary (Last 24 hours) at 05/23/2024 1908 Last data filed at 05/23/2024 8144 Gross per 24 hour  Intake 1572.22 ml  Output 1150 ml  Net 422.22 ml   Filed Weights   05/18/24 0751 05/20/24 1342 05/20/24 1941  Weight: 79.4 kg 79.4 kg 79.2 kg   Examination: Physical Exam:  Constitutional: WN/WD slightly overweight chronically ill-appearing Caucasian male in no acute distress Respiratory: Diminished to auscultation bilaterally, no wheezing, rales,  rhonchi or crackles. Normal respiratory effort and patient is not tachypenic. No accessory muscle use.  Unlabored breathing Cardiovascular: RRR, no murmurs / rubs / gallops. S1 and S2 auscultated. No extremity edema. Abdomen: Soft, somewhat tender and slightly distended. Bowel sounds are diminished.  GU: Deferred. Musculoskeletal: No clubbing / cyanosis of digits/nails. No joint deformity upper and lower extremities. Skin: No rashes, lesions, ulcers on limited skin evaluation.  No induration; Warm and dry.  Neurologic: CN 2-12 grossly intact with no focal deficits. Romberg sign and cerebellar reflexes not assessed.  Psychiatric: Normal judgment and insight. Alert and oriented x 3. Normal mood and appropriate affect.   Data Reviewed: I have personally reviewed following labs and imaging studies  CBC: Recent Labs  Lab 05/21/24 2204 05/22/24 0157 05/22/24 1048 05/22/24 1844 05/23/24 0040 05/23/24 1423  WBC 21.7* 21.1* 21.7* 26.6*  --  30.5*  NEUTROABS 17.3* 17.1* 18.1* 22.5*  --  29.3*  HGB 7.3* 7.0* 7.1* 7.5* 7.0* 6.9*  HCT 22.8* 21.4* 22.4* 23.5* 22.2* 21.9*  MCV 91.9 91.8 92.9 95.1  --  95.6  PLT 201 195 251 273  --  332   Basic Metabolic Panel: Recent Labs  Lab 05/20/24 0412 05/20/24 1959 05/20/24 2025 05/21/24 0255 05/22/24 0200 05/23/24 0040 05/23/24 1120  NA 137 136 137 135 140 137  --   K 4.4 4.6 4.9 5.0 4.0 4.0  --   CL 102  --  105 105 106 104  --   CO2 26  --  24 21* 23 21*  --   GLUCOSE 109*  --  134* 109* 112* 94  --   BUN 52*  --  53* 53* 50* 34*  --   CREATININE 1.45*  --  1.73* 1.94* 1.79* 1.24  --   CALCIUM  7.9*  --  7.4* 7.5* 7.4* 7.4*  --   MG  --   --  1.7 1.7  --  2.2  --   PHOS  --   --  5.4* 5.9*  --   --  3.3   GFR: Estimated Creatinine Clearance: 59.7 mL/min (by C-G formula based on SCr of 1.24 mg/dL). Liver Function Tests: Recent Labs  Lab 05/18/24 0447 05/20/24 2025 05/23/24 1120  AST 25 16 16   ALT 20 13 9   ALKPHOS 68 41 43  BILITOT  0.4 0.5 0.6  PROT 5.0* 3.3* 4.6*  ALBUMIN  2.4* 1.8* <1.5*   Recent Labs  Lab 05/18/24 0447  LIPASE 26   No results for input(s): AMMONIA in the last 168 hours. Coagulation Profile: Recent Labs  Lab 05/18/24 0447 05/20/24 2040  INR 1.2 1.3*   Cardiac Enzymes: No results for input(s): CKTOTAL, CKMB, CKMBINDEX, TROPONINI in the last 168 hours. BNP (last 3 results) No results for input(s): PROBNP in the last 8760 hours. HbA1C: No results for input(s): HGBA1C in the last 72 hours. CBG: Recent Labs  Lab 05/22/24 2334 05/23/24 0417 05/23/24 0726 05/23/24 1126 05/23/24 1528  GLUCAP 108* 104* 101* 99 101*   Lipid Profile: Recent Labs    05/21/24 0255  TRIG 157*   Thyroid Function Tests: No results for input(s): TSH, T4TOTAL, FREET4, T3FREE, THYROIDAB in the last 72 hours. Anemia Panel: No results for input(s): VITAMINB12, FOLATE, FERRITIN, TIBC, IRON , RETICCTPCT in the last 72 hours. Sepsis Labs: No results for input(s): PROCALCITON, LATICACIDVEN in the last 168 hours.  Recent Results (from the past 240 hours)  MRSA Next Gen by PCR, Nasal     Status: None   Collection Time: 05/18/24  7:50 AM   Specimen: Nasal Mucosa; Nasal Swab  Result Value Ref Range Status   MRSA by PCR Next Gen NOT DETECTED NOT DETECTED Final    Comment: (NOTE) The GeneXpert MRSA Assay (FDA approved for NASAL specimens only), is one component of a comprehensive MRSA colonization surveillance program. It is not intended to diagnose MRSA infection nor to guide or monitor treatment for MRSA infections. Test performance is not FDA approved in patients less than 40 years old. Performed at [facility], [address] [place], [place] [address]   MRSA Next Gen by PCR, Nasal     Status: None   Collection Time: 05/20/24  7:42 PM   Specimen: Nasal Mucosa; Nasal Swab  Result Value Ref Range Status   MRSA by PCR Next Gen NOT DETECTED NOT DETECTED Final  Comment: (NOTE) The GeneXpert MRSA Assay (FDA approved for NASAL specimens only), is one component of a comprehensive MRSA colonization surveillance program. It is not intended to diagnose MRSA infection nor to guide or monitor treatment for MRSA infections. Test performance is not FDA approved in patients less than 31 years old. Performed at [facility], [address], [place], [place] [address]     Radiology Studies: DG UGI W SINGLE CM (SOL OR THIN BA) Result Date: 05/23/2024 CLINICAL DATA:  Recent bleeding duodenal ulcer. Status post exploratory laparotomy and oversew of the gastroduodenal artery performed May 20, 2024. Request for upper GI series to evaluate for leak. EXAM: DG UGI W SINGLE CM TECHNIQUE: Scout radiograph was obtained. Single contrast examination was performed using water -soluble contrast. This exam was performed by Sari Lamp, PA-C, and was supervised and interpreted by Dr. Rockey Kilts. FLUOROSCOPY: Radiation Exposure Index (as provided by the fluoroscopic device): 23.3 mGy Kerma COMPARISON:  None Available. FINDINGS: Scout Radiograph: Nonobstructive bowel gas pattern. Surgical clips project over the proximal duodenum. Midline laparotomy staples. Nasogastric tube terminating in the proximal stomach with side port just below the gastroesophageal junction. Stomach: Normal appearance.  No mucosal lesion seen. Gastric emptying: Normal emptying without obstruction. Duodenum: Irregularity of the proximal duodenal lumen likely represents the site of treated ulcer and/or postoperative edema. Example on series 11. There is no extravasation of contrast seen to indicate leak. Other:  None. IMPRESSION: No contrast extravasation to suggest leak. Electronically Signed   By: Rockey Kilts M.D.   On: 05/23/2024 14:46   Scheduled Meds:  amLODipine   5 mg Per Tube Daily   arformoterol   15 mcg Nebulization BID   atorvastatin   40 mg Per Tube Daily   Chlorhexidine  Gluconate Cloth  6 each  Topical Daily   pantoprazole  (PROTONIX ) IV  40 mg Intravenous Q24H   revefenacin   175 mcg Nebulization Daily   rOPINIRole   1 mg Per Tube QHS   sodium chloride  flush  3 mL Intravenous Q12H   terazosin   10 mg Per Tube QHS   Continuous Infusions:   LOS: 5 days   Alejandro Marker, DO [facility] Available via Epic secure chat 7am-7pm After these hours, please refer to coverage provider listed on [email or web address] 05/23/2024, 7:08 PM

## 2024-05-23 NOTE — Plan of Care (Signed)

## 2024-05-23 NOTE — Progress Notes (Signed)
 3 Days Post-Op   Subjective/Chief Complaint: No issues.  Wants the NG out!  Passing flatus   Objective: Vital signs in last 24 hours: Temp:  [97.6 F (36.4 C)-98.6 F (37 C)] 97.7 F (36.5 C) (08/11 0750) Pulse Rate:  [68-89] 76 (08/11 1000) Resp:  [14-20] 16 (08/11 1000) BP: (92-141)/(48-67) 118/51 (08/11 1000) SpO2:  [91 %-99 %] 99 % (08/11 1000) Last BM Date : 05/20/24  Intake/Output from previous day: 08/10 0701 - 08/11 0700 In: 1095.6 [NG/GT:60; IV Piggyback:1035.6] Out: 1720 [Urine:1500; Emesis/NG output:200; Drains:20] Intake/Output this shift: Total I/O In: 160 [NG/GT:160] Out: -   General appearance: alert and cooperative Resp: clear to auscultation bilaterally Cardio: regular rate and rhythm GI: soft, mild tenderness. Ng output bilious now  Lab Results:  Recent Labs    05/22/24 1048 05/22/24 1844 05/23/24 0040  WBC 21.7* 26.6*  --   HGB 7.1* 7.5* 7.0*  HCT 22.4* 23.5* 22.2*  PLT 251 273  --    BMET Recent Labs    05/22/24 0200 05/23/24 0040  NA 140 137  K 4.0 4.0  CL 106 104  CO2 23 21*  GLUCOSE 112* 94  BUN 50* 34*  CREATININE 1.79* 1.24  CALCIUM  7.4* 7.4*   PT/INR Recent Labs    05/20/24 2040  LABPROT 16.5*  INR 1.3*   ABG Recent Labs    05/20/24 1959  PHART 7.370  HCO3 24.7    Studies/Results: No results found.   Anti-infectives: Anti-infectives (From admission, onward)    Start     Dose/Rate Route Frequency Ordered Stop   05/20/24 2200  ceFAZolin  (ANCEF ) IVPB 1 g/50 mL premix        1 g 100 mL/hr over 30 Minutes Intravenous Every 8 hours 05/20/24 1938 05/22/24 1411   05/20/24 1607  ceFAZolin  (ANCEF ) 2-4 GM/100ML-% IVPB       Note to Pharmacy: Cordella Area M: cabinet override      05/20/24 1607 05/21/24 0414       Assessment/Plan: David Callahan presented to [facility] with a UGI bleed which was unable to be controlled endoscopically, so he underwent open oversew of duodenal ulcer on 05/20/24, with Dr.  Evonnie.  Check UGI x-ray today, if no issues, remove NG and start liquid diet JP drain with minimal output NG bilious Hg stable Will follow  LOS: 5 days    David Callahan 05/23/2024

## 2024-05-23 NOTE — Progress Notes (Signed)
 PROGRESS NOTE    [name]  FMW:[id number] DOB: 1956/02/14 DOA: 05/18/2024 PCP: [name], NP   Brief Narrative:  David Callahan is a 68 y.o. male with medical history significant for COPD, hypertension, and peptic ulcer disease who presents with hematemesis, black stools, and exertional dyspnea.  Found to have a duodenal ulcer on EGD but this could not be stopped with bleeding so went to the OR emergently and underwent exploratory laparotomy to control his duodenal bleeding ulcer and had an omental patch over the do ectomy.  He is postop and had to be transferred to the unit and was intubated and subsequently extubated on 05/21/2024. Transferred to [facility] on 05/23/24.  Blood count is continued to drop slowly so he is already status post 13 units of PRBCs and only received one of the 2 units ordered yesterday.  White count continues to worsen but is now improving.  CT abdomen and pelvis was done and showed no evidence of infection.  Of note he has been very large black bowel movement today and case was discussed with GI and general surgery.  The surgery team feels that this is likely old blood and recommends that if the patient has any hemodynamic instability or any further need for transfusion that they recommend stat angiogram for stat angioembolization.  In the interim we will cycle his H&H is every 6.  Assessment and Plan:    Acute upper GI bleed and Blood Loss Anemia / Hemorrhagic Shock; Symptomatic Anemia status post exploratory laparotomy and control of his bleeding duodenal ulcer on 05/20/2024: GI consulted due to his peptic ulcer disease presented with hematemesis and black stools.  He had an EGD done which showed a large duodenal ulcer and subsequently distal not able to be stopped so he went to the OR and underwent exploratory laparotomy and controlling of his duodenal bleeding ulcer and had omental patch over his duodenectomy closure.  He went to the ICU postoperatively and has  been transferred to [facility] service on 05/23/2024.  Blood count continues to be drop and current Trend: Recent Labs  Lab 05/22/24 1844 05/23/24 0040 05/23/24 1423 05/23/24 2133 05/24/24 0247 05/24/24 1146 05/24/24 1621  HGB 7.5* 7.0* 6.9* 7.4* 7.1* 7.5* 7.7*  HCT 23.5* 22.2* 21.9* 22.9* 22.3* 23.6* 24.3*  MCV 95.1  --  95.6 94.2 94.9 94.4  --   -Type and Screen and Transfuse 2 units of pRBCs (only 1 out of 2 units given on 8/11). He is already s/p 13 units  -Was  NPO and bowel rest. Has NG and surgery following his NG was removed and diet was advanced to full's.  Had a very large black bloody stool today and will monitor hemoglobin/hematocrit every 6 now.  Case was discussed with GI who is deferring to general surgery and they are recommending against EGD at this point.  General surgery feels that this is all probably old blood unless he has new hemodynamic instability.  Dr. Lyndel recommends that if the patient develops hemodynamic instability or need for transfusion would recommend a CT angiogram stat with eval for angio embolization  -Further care per surgery recommendations - PT OT recommending home health PT and OT when medically stable for discharge  Leukocytosis: Worsening. WBC Trend:  Recent Labs  Lab 05/22/24 0157 05/22/24 1048 05/22/24 1844 05/23/24 1423 05/23/24 2133 05/24/24 0247 05/24/24 1146  WBC 21.1* 21.7* 26.6* 30.5* 29.9* 25.5* 26.4*  -Continue monitoring and trend -CT scan of the abdomen pelvis done without  contrast today and showed interval postsurgical changes of the ex lap with repair of the duodenal ulcer and omental patch without complication and infection.  Patient was noted to have new groundglass opacities in the right greater than left lobes concerning for low volume aspiration so we will obtain SLP evaluation.  His NG tube is now been removed.  See the official CT scan report for full findings -UA unremarkable except small hemoglobin and rare bacteria and  blood cultures x 2 pending; chest x-ray showed No acute cardiopulmonary disease and that the Endotracheal tube and nasogastric tube have been removed.  AKI / Metabolic Acidosis: Improved. BUN/Cr appears that it peaked at 53/1.94 and is now 24/1.12.  Metabolic acidosis is improved. Avoid Nephrotoxic Medications, Contrast Dyes, Hypotension and Dehydration to Ensure Adequate Renal Perfusion and will need to Renally Adjust Meds. CTM and Trend Renal Function carefully and repeat CMP in the AM   BPH: Terazosin  10 mg per Tube daily has been resumed  HLD: C/w Atorvastatin  40 mg per Tube Daily   Essential Hypertension: Was hypotensive on admission due to shock and Antihypertensives were held but blood pressures improved. Now Continuing Amlodipine  5 mg p.o. daily. C/w IV Hydralazine  10 mg q4hprn for SBP >180. CTM BP per Protocol. Last BP reading was 118/61   COPD: Not in exacerbation. On Incruse Ellipta  and as-needed short-acting bronchodilators. C/w Arformoterol  15 mcg neb BID and Revefenacin  175 mcg Neb Daily. C/w  DuoNeb 3 mL Neb q6hprn Wheezing. Repeat CXR in the AM  GERD/GI Prophylaxis: C/w PPI with pantoprazole  and changed to IV 40 mg every 12  Hypoalbuminemia: Patient's Albumin  Lvl went from 3.0 -> 2.4 -> 1.8 -> <1.5. CTM and Trend and repeat CMP in the AM  Overweight: Complicates overall prognosis and care. Estimated body mass index is 25.05 kg/m as calculated from the following:   Height as of this encounter: 5' 10 (1.778 m).   Weight as of this encounter: 79.2 kg. Weight Loss and Dietary Counseling given    DVT prophylaxis: SCDs Start: 05/18/24 0605    Code Status: Full Code Family Communication: No family currently at bedside  Disposition Plan:  Level of care: Progressive Status is: Inpatient Remains inpatient appropriate because: Needs further clinical improvement clearance by specialists and needs to tolerate a diet without issues  Consultants:  General  Surgery Gastroenterology PCCM transfer  Procedures:  As delineated as above  Antimicrobials:  Anti-infectives (From admission, onward)    Start     Dose/Rate Route Frequency Ordered Stop   05/20/24 2200  ceFAZolin  (ANCEF ) IVPB 1 g/50 mL premix        1 g 100 mL/hr over 30 Minutes Intravenous Every 8 hours 05/20/24 1938 05/22/24 1411   05/20/24 1607  ceFAZolin  (ANCEF ) 2-4 GM/100ML-% IVPB       Note to Pharmacy: Cordella Area M: cabinet override      05/20/24 1607 05/21/24 0414        Subjective: Seen and examined at bedside and has NG has been removed.  Feels okay and denies any abdominal discomfort.  No nausea or vomiting.  No other concerns or complaints at this time but then nursing reported that he had very large black stool    Objective: Vitals:   05/24/24 1130 05/24/24 1200 05/24/24 1521 05/24/24 1555  BP:  (!) 114/54  118/61  Pulse:  82    Resp:  20  16  Temp: 98.7 F (37.1 C)  98.5 F (36.9 C) 97.9 F (36.6 C)  TempSrc: Oral  Oral Oral  SpO2:  99%  99%  Weight:      Height:        Intake/Output Summary (Last 24 hours) at 05/24/2024 1729 Last data filed at 05/24/2024 1431 Gross per 24 hour  Intake 1036.66 ml  Output 1950 ml  Net -913.34 ml   Filed Weights   05/18/24 0751 05/20/24 1342 05/20/24 1941  Weight: 79.4 kg 79.4 kg 79.2 kg   Examination: Physical Exam:  Constitutional: WN/WD overweight chronically ill-appearing Caucasian male in no acute distress Respiratory: Diminished to auscultation bilaterally, no wheezing, rales, rhonchi or crackles. Normal respiratory effort and patient is not tachypenic. No accessory muscle use.  Unlabored breathing Cardiovascular: RRR, no murmurs / rubs / gallops. S1 and S2 auscultated. No extremity edema.  Abdomen: Soft, a little-tender, distended secondary to body habitus slightly. Bowel sounds positive.  JP drain is in place GU: Deferred. Musculoskeletal: No clubbing / cyanosis of digits/nails. No joint deformity  upper and lower extremities.  Skin: No rashes, lesions, ulcers on limited skin evaluation. No induration; Warm and dry.  Neurologic: CN 2-12 grossly intact with no focal deficits. Romberg sign and cerebellar reflexes not assessed.  Psychiatric: Normal judgment and insight. Alert and oriented x 3. Normal mood and appropriate affect.   Data Reviewed: I have personally reviewed following labs and imaging studies  CBC: Recent Labs  Lab 05/22/24 1048 05/22/24 1844 05/23/24 0040 05/23/24 1423 05/23/24 2133 05/24/24 0247 05/24/24 1146 05/24/24 1621  WBC 21.7* 26.6*  --  30.5* 29.9* 25.5* 26.4*  --   NEUTROABS 18.1* 22.5*  --  29.3* 26.4*  --  23.2*  --   HGB 7.1* 7.5*   < > 6.9* 7.4* 7.1* 7.5* 7.7*  HCT 22.4* 23.5*   < > 21.9* 22.9* 22.3* 23.6* 24.3*  MCV 92.9 95.1  --  95.6 94.2 94.9 94.4  --   PLT 251 273  --  332 325 309 363  --    < > = values in this interval not displayed.   Basic Metabolic Panel: Recent Labs  Lab 05/20/24 2025 05/21/24 0255 05/22/24 0200 05/23/24 0040 05/23/24 1120 05/24/24 1146  NA 137 135 140 137  --  135  K 4.9 5.0 4.0 4.0  --  3.7  CL 105 105 106 104  --  100  CO2 24 21* 23 21*  --  24  GLUCOSE 134* 109* 112* 94  --  94  BUN 53* 53* 50* 34*  --  24*  CREATININE 1.73* 1.94* 1.79* 1.24  --  1.12  CALCIUM  7.4* 7.5* 7.4* 7.4*  --  8.0*  MG 1.7 1.7  --  2.2  --  1.8  PHOS 5.4* 5.9*  --   --  3.3 2.9   GFR: Estimated Creatinine Clearance: 66.1 mL/min (by C-G formula based on SCr of 1.12 mg/dL). Liver Function Tests: Recent Labs  Lab 05/18/24 0447 05/20/24 2025 05/23/24 1120 05/24/24 1146  AST 25 16 16 21   ALT 20 13 9 10   ALKPHOS 68 41 43 71  BILITOT 0.4 0.5 0.6 0.6  PROT 5.0* 3.3* 4.6* 4.5*  ALBUMIN  2.4* 1.8* <1.5* 1.9*   Recent Labs  Lab 05/18/24 0447  LIPASE 26   No results for input(s): AMMONIA in the last 168 hours. Coagulation Profile: Recent Labs  Lab 05/18/24 0447 05/20/24 2040  INR 1.2 1.3*   Cardiac Enzymes: No  results for input(s): CKTOTAL, CKMB, CKMBINDEX, TROPONINI in the last 168 hours. BNP (last 3  results) No results for input(s): PROBNP in the last 8760 hours. HbA1C: No results for input(s): HGBA1C in the last 72 hours. CBG: Recent Labs  Lab 05/23/24 2324 05/24/24 0345 05/24/24 0744 05/24/24 1131 05/24/24 1522  GLUCAP 90 96 148* 106* 100*   Lipid Profile: No results for input(s): CHOL, HDL, LDLCALC, TRIG, CHOLHDL, LDLDIRECT in the last 72 hours.  Thyroid Function Tests: No results for input(s): TSH, T4TOTAL, FREET4, T3FREE, THYROIDAB in the last 72 hours. Anemia Panel: No results for input(s): VITAMINB12, FOLATE, FERRITIN, TIBC, IRON , RETICCTPCT in the last 72 hours. Sepsis Labs: No results for input(s): PROCALCITON, LATICACIDVEN in the last 168 hours.  Recent Results (from the past 240 hours)  MRSA Next Gen by PCR, Nasal     Status: None   Collection Time: 05/18/24  7:50 AM   Specimen: Nasal Mucosa; Nasal Swab  Result Value Ref Range Status   MRSA by PCR Next Gen NOT DETECTED NOT DETECTED Final    Comment: (NOTE) The GeneXpert MRSA Assay (FDA approved for NASAL specimens only), is one component of a comprehensive MRSA colonization surveillance program. It is not intended to diagnose MRSA infection nor to guide or monitor treatment for MRSA infections. Test performance is not FDA approved in patients less than 22 years old. Performed at [facility], [address] [place], [place] [address]   MRSA Next Gen by PCR, Nasal     Status: None   Collection Time: 05/20/24  7:42 PM   Specimen: Nasal Mucosa; Nasal Swab  Result Value Ref Range Status   MRSA by PCR Next Gen NOT DETECTED NOT DETECTED Final    Comment: (NOTE) The GeneXpert MRSA Assay (FDA approved for NASAL specimens only), is one component of a comprehensive MRSA colonization surveillance program. It is not intended to diagnose MRSA infection nor to guide or monitor  treatment for MRSA infections. Test performance is not FDA approved in patients less than 16 years old. Performed at [facility], [address] [place], [place] [address]   Culture, blood (Routine X 2) w Reflex to ID Panel     Status: None (Preliminary result)   Collection Time: 05/23/24  9:20 PM   Specimen: BLOOD  Result Value Ref Range Status   Specimen Description BLOOD SITE NOT SPECIFIED  Final   Special Requests   Final    BOTTLES DRAWN AEROBIC ONLY Blood Culture results may not be optimal due to an inadequate volume of blood received in culture bottles   Culture   Final    NO GROWTH < 12 HOURS Performed at [facility], [address], [place], [place] [address]    Report Status PENDING  Incomplete  Culture, blood (Routine X 2) w Reflex to ID Panel     Status: None (Preliminary result)   Collection Time: 05/23/24  9:33 PM   Specimen: BLOOD LEFT HAND  Result Value Ref Range Status   Specimen Description BLOOD LEFT HAND  Final   Special Requests   Final    BOTTLES DRAWN AEROBIC AND ANAEROBIC Blood Culture adequate volume   Culture   Final    NO GROWTH < 12 HOURS Performed at [facility], [address], [place], [place] [address]    Report Status PENDING  Incomplete    Radiology Studies: CT ABDOMEN PELVIS WO CONTRAST Result Date: 05/24/2024 CLINICAL DATA:  Postoperative abdominal pain status post laparotomy EXAM: CT ABDOMEN AND PELVIS WITHOUT CONTRAST TECHNIQUE: Multidetector CT imaging of the abdomen  and pelvis was performed following the standard protocol without IV contrast. RADIATION DOSE REDUCTION: This exam was performed according to the departmental dose-optimization program which includes automated exposure control, adjustment of the mA and/or kV according to patient size and/or use of iterative reconstruction technique. COMPARISON:  CT a abdomen and pelvis 05/19/2024 FINDINGS: Lower chest: New patchy ground-glass attenuation airspace opacities in a  peribronchovascular distribution throughout the dependent portions of the right lower lobe, and to a lesser extent the left lower lobe. Findings suggest small volume aspiration. Scattered calcified granulomas and left infrahilar lymph node. The intracardiac blood pool is hypodense relative to the adjacent myocardium consistent with anemia. Atherosclerotic calcifications along the coronary arteries and within the aorta. Hepatobiliary: No focal liver abnormality is seen. No gallstones, gallbladder wall thickening, or biliary dilatation. Punctate calcifications consistent with old granulomatous disease. Pancreas: Unremarkable. No pancreatic ductal dilatation or surrounding inflammatory changes. Spleen: Normal in size. Punctate calcifications throughout the spleen consistent with old granulomatous disease. Adrenals/Urinary Tract: Normal adrenal glands. No hydronephrosis, or nephrolithiasis. Unremarkable ureters and bladder. Stomach/Bowel: No evidence of bowel obstruction. Surgical changes along the proximal duodenum consistent with recent surgical intervention. A surgical drain is present in the left upper quadrant. No evidence of abscess, ascites or large free air. Vascular/Lymphatic: Limited evaluation in the absence of intravenous contrast. Mild aneurysmal dilation of the infrarenal abdominal aorta with a maximal diameter of 3.6 cm. No suspicious lymphadenopathy. Reproductive: Prostate is unremarkable. Other: No abdominal wall hernia or abnormality. No abdominopelvic ascites. Musculoskeletal: Chronic bilateral L5 pars defects with mild grade 1 anterolisthesis of L5 on S1. IMPRESSION: 1. Interval surgical changes of exploratory laparotomy, repair of bleeding duodenal ulcer and omental patch without evidence of complication (no free air, ascites, or abscess). 2. Well-positioned surgical drain in the right upper quadrant. 3. New patchy ground-glass airspace opacities in the right greater than left lower lobes concerning  for low volume aspiration. 4. Aortic and coronary artery atherosclerotic vascular calcifications. 5. Evidence of old granulomatous disease affecting the spleen, liver, lungs and mediastinal lymph nodes. 6. Chronic bilateral L5 pars defects with associated mild grade 1 anterolisthesis of L5 on S1. 7. Infrarenal abdominal aortic aneurysm again noted. Maximal diameter 3.6 cm, no change compared to recent prior imaging from 05/19/2024. Agree with prior follow-up recommendations. Electronically Signed   By: Wilkie Lent M.D.   On: 05/24/2024 12:56   DG CHEST PORT 1 VIEW Result Date: 05/24/2024 CLINICAL DATA:  Leukocytosis 100030 EXAM: PORTABLE CHEST 1 VIEW COMPARISON:  05/20/2024 FINDINGS: Endotracheal tube and nasogastric tube have been removed. No focal airspace disease. No focal lung consolidation. No pulmonary edema. Again noted are calcifications in the left mediastinum/hilar region. Severe degenerative changes involving the right shoulder. Heart size is within normal limits and stable. Trachea is midline. IMPRESSION: 1. No acute cardiopulmonary disease. 2. Endotracheal tube and nasogastric tube have been removed. Electronically Signed   By: Juliene Balder M.D.   On: 05/24/2024 08:32   DG UGI W SINGLE CM (SOL OR THIN BA) Result Date: 05/23/2024 CLINICAL DATA:  Recent bleeding duodenal ulcer. Status post exploratory laparotomy and oversew of the gastroduodenal artery performed May 20, 2024. Request for upper GI series to evaluate for leak. EXAM: DG UGI W SINGLE CM TECHNIQUE: Scout radiograph was obtained. Single contrast examination was performed using water -soluble contrast. This exam was performed by Sari Lamp, PA-C, and was supervised and interpreted by Dr. Rockey Kilts. FLUOROSCOPY: Radiation Exposure Index (as provided by the fluoroscopic device): 23.3 mGy Kerma COMPARISON:  None Available. FINDINGS: Scout Radiograph: Nonobstructive bowel gas pattern. Surgical clips project over the proximal duodenum.  Midline laparotomy staples. Nasogastric tube terminating in the proximal stomach with side port just below the gastroesophageal junction. Stomach: Normal appearance.  No mucosal lesion seen. Gastric emptying: Normal emptying without obstruction. Duodenum: Irregularity of the proximal duodenal lumen likely represents the site of treated ulcer and/or postoperative edema. Example on series 11. There is no extravasation of contrast seen to indicate leak. Other:  None. IMPRESSION: No contrast extravasation to suggest leak. Electronically Signed   By: Rockey Kilts M.D.   On: 05/23/2024 14:46   Scheduled Meds:  [START ON 05/25/2024] amLODipine   5 mg Oral Daily   arformoterol   15 mcg Nebulization BID   [START ON 05/25/2024] atorvastatin   40 mg Oral Daily   Chlorhexidine  Gluconate Cloth  6 each Topical Daily   pantoprazole  (PROTONIX ) IV  40 mg Intravenous Q12H   polyethylene glycol  17 g Oral BID   revefenacin   175 mcg Nebulization Daily   rOPINIRole   1 mg Oral QHS   senna-docusate  1 tablet Oral BID   sodium chloride  flush  3 mL Intravenous Q12H   sucralfate   1 g Oral TID WC & HS   terazosin   10 mg Oral QHS   Continuous Infusions:  famotidine  (PEPCID ) IV      LOS: 6 days   Alejandro Marker, DO [facility] Available via Epic secure chat 7am-7pm After these hours, please refer to coverage provider listed on [email or web address] 05/24/2024, 5:29 PM

## 2024-05-24 ENCOUNTER — Encounter (HOSPITAL_COMMUNITY): Payer: Self-pay | Admitting: Internal Medicine

## 2024-05-24 ENCOUNTER — Inpatient Hospital Stay (HOSPITAL_COMMUNITY)

## 2024-05-24 DIAGNOSIS — N179 Acute kidney failure, unspecified: Secondary | ICD-10-CM | POA: Diagnosis not present

## 2024-05-24 DIAGNOSIS — D500 Iron deficiency anemia secondary to blood loss (chronic): Secondary | ICD-10-CM | POA: Diagnosis not present

## 2024-05-24 DIAGNOSIS — J449 Chronic obstructive pulmonary disease, unspecified: Secondary | ICD-10-CM | POA: Diagnosis not present

## 2024-05-24 DIAGNOSIS — K264 Chronic or unspecified duodenal ulcer with hemorrhage: Secondary | ICD-10-CM | POA: Diagnosis not present

## 2024-05-24 LAB — COMPREHENSIVE METABOLIC PANEL WITH GFR
ALT: 10 U/L (ref 0–44)
AST: 21 U/L (ref 15–41)
Albumin: 1.9 g/dL — ABNORMAL LOW (ref 3.5–5.0)
Alkaline Phosphatase: 71 U/L (ref 38–126)
Anion gap: 11 (ref 5–15)
BUN: 24 mg/dL — ABNORMAL HIGH (ref 8–23)
CO2: 24 mmol/L (ref 22–32)
Calcium: 8 mg/dL — ABNORMAL LOW (ref 8.9–10.3)
Chloride: 100 mmol/L (ref 98–111)
Creatinine, Ser: 1.12 mg/dL (ref 0.61–1.24)
GFR, Estimated: >60 mL/min (ref >60)
Glucose, Bld: 94 mg/dL (ref 70–99)
Potassium: 3.7 mmol/L (ref 3.5–5.1)
Sodium: 135 mmol/L (ref 135–145)
Total Bilirubin: 0.6 mg/dL (ref 0.0–1.2)
Total Protein: 4.5 g/dL — ABNORMAL LOW (ref 6.5–8.1)

## 2024-05-24 LAB — CBC
HCT: 22.3 % — ABNORMAL LOW (ref 39.0–52.0)
Hemoglobin: 7.1 g/dL — ABNORMAL LOW (ref 13.0–17.0)
MCH: 30.2 pg (ref 26.0–34.0)
MCHC: 31.8 g/dL (ref 30.0–36.0)
MCV: 94.9 fL (ref 80.0–100.0)
Platelets: 309 K/uL (ref 150–400)
RBC: 2.35 MIL/uL — ABNORMAL LOW (ref 4.22–5.81)
RDW: 18.7 % — ABNORMAL HIGH (ref 11.5–15.5)
WBC: 25.5 K/uL — ABNORMAL HIGH (ref 4.0–10.5)
nRBC: 0 % (ref 0.0–0.2)

## 2024-05-24 LAB — CBC WITH DIFFERENTIAL/PLATELET
Abs Granulocyte: 24.8 K/uL — ABNORMAL HIGH (ref 1.5–6.5)
Abs Immature Granulocytes: 0.31 K/uL — ABNORMAL HIGH (ref 0.00–0.07)
Abs Immature Granulocytes: 0.36 K/uL — ABNORMAL HIGH (ref 0.00–0.07)
Basophils Absolute: 0 K/uL (ref 0.0–0.1)
Basophils Absolute: 0.1 K/uL (ref 0.0–0.1)
Basophils Relative: 0 %
Basophils Relative: 0 %
Eosinophils Absolute: 0 K/uL (ref 0.0–0.5)
Eosinophils Absolute: 0.1 K/uL (ref 0.0–0.5)
Eosinophils Relative: 0 %
Eosinophils Relative: 0 %
HCT: 23.6 % — ABNORMAL LOW (ref 39.0–52.0)
HCT: 24.8 % — ABNORMAL LOW (ref 39.0–52.0)
Hemoglobin: 7.5 g/dL — ABNORMAL LOW (ref 13.0–17.0)
Hemoglobin: 7.8 g/dL — ABNORMAL LOW (ref 13.0–17.0)
Immature Granulocytes: 1 %
Immature Granulocytes: 1 %
Lymphocytes Relative: 6 %
Lymphocytes Relative: 6 %
Lymphs Abs: 1.6 K/uL (ref 0.7–4.0)
Lymphs Abs: 1.8 K/uL (ref 0.7–4.0)
MCH: 30 pg (ref 26.0–34.0)
MCH: 30.2 pg (ref 26.0–34.0)
MCHC: 31.8 g/dL (ref 30.0–36.0)
MCHC: 31.5 g/dL (ref 30.0–36.0)
MCV: 94.4 fL (ref 80.0–100.0)
MCV: 96.1 fL (ref 80.0–100.0)
Monocytes Absolute: 1.2 K/uL — ABNORMAL HIGH (ref 0.1–1.0)
Monocytes Absolute: 1.2 K/uL — ABNORMAL HIGH (ref 0.1–1.0)
Monocytes Relative: 5 %
Monocytes Relative: 4 %
Neutro Abs: 23.2 K/uL — ABNORMAL HIGH (ref 1.7–7.7)
Neutro Abs: 24.8 K/uL — ABNORMAL HIGH (ref 1.7–7.7)
Neutrophils Relative %: 88 %
Neutrophils Relative %: 89 %
Platelets: 363 K/uL (ref 150–400)
Platelets: 399 K/uL (ref 150–400)
RBC: 2.5 MIL/uL — ABNORMAL LOW (ref 4.22–5.81)
RBC: 2.58 MIL/uL — ABNORMAL LOW (ref 4.22–5.81)
RDW: 19 % — ABNORMAL HIGH (ref 11.5–15.5)
RDW: 18.6 % — ABNORMAL HIGH (ref 11.5–15.5)
WBC: 26.4 K/uL — ABNORMAL HIGH (ref 4.0–10.5)
WBC: 28.2 K/uL — ABNORMAL HIGH (ref 4.0–10.5)
nRBC: 0.1 % (ref 0.0–0.2)
nRBC: 0 % (ref 0.0–0.2)

## 2024-05-24 LAB — PHOSPHORUS: Phosphorus: 2.9 mg/dL (ref 2.5–4.6)

## 2024-05-24 LAB — GLUCOSE, CAPILLARY
Glucose-Capillary: 96 mg/dL (ref 70–99)
Glucose-Capillary: 148 mg/dL — ABNORMAL HIGH (ref 70–99)
Glucose-Capillary: 106 mg/dL — ABNORMAL HIGH (ref 70–99)
Glucose-Capillary: 100 mg/dL — ABNORMAL HIGH (ref 70–99)

## 2024-05-24 LAB — HEMOGLOBIN AND HEMATOCRIT, BLOOD
HCT: 24.3 % — ABNORMAL LOW (ref 39.0–52.0)
HCT: 21.5 % — ABNORMAL LOW (ref 39.0–52.0)
Hemoglobin: 7.7 g/dL — ABNORMAL LOW (ref 13.0–17.0)
Hemoglobin: 7 g/dL — ABNORMAL LOW (ref 13.0–17.0)

## 2024-05-24 LAB — MAGNESIUM: Magnesium: 1.8 mg/dL (ref 1.7–2.4)

## 2024-05-24 MED ORDER — ROPINIROLE HCL 1 MG PO TABS
1.0000 mg | ORAL_TABLET | Freq: Every day | ORAL | Status: DC
  Administered 2024-05-24 – 2024-05-25 (×4): 1 mg via ORAL
  Filled 2024-05-24 (×3): qty 1

## 2024-05-24 MED ORDER — HYDROCODONE-ACETAMINOPHEN 10-325 MG PO TABS
1.0000 | ORAL_TABLET | ORAL | Status: DC | PRN
  Administered 2024-05-24 – 2024-05-26 (×8): 1 via ORAL
  Filled 2024-05-24 (×5): qty 1

## 2024-05-24 MED ORDER — SUCRALFATE 1 GM/10ML PO SUSP
1.0000 g | Freq: Three times a day (TID) | ORAL | Status: DC
  Administered 2024-05-24 – 2024-05-26 (×13): 1 g via ORAL
  Filled 2024-05-24 (×8): qty 10

## 2024-05-24 MED ORDER — SENNOSIDES-DOCUSATE SODIUM 8.6-50 MG PO TABS
1.0000 | ORAL_TABLET | Freq: Two times a day (BID) | ORAL | Status: DC
  Administered 2024-05-24 – 2024-05-26 (×7): 1 via ORAL
  Filled 2024-05-24 (×4): qty 1

## 2024-05-24 MED ORDER — AMLODIPINE BESYLATE 5 MG PO TABS
5.0000 mg | ORAL_TABLET | Freq: Every day | ORAL | Status: DC
  Administered 2024-05-25 – 2024-05-26 (×3): 5 mg via ORAL
  Filled 2024-05-24 (×2): qty 1

## 2024-05-24 MED ORDER — PANTOPRAZOLE SODIUM 40 MG PO TBEC: 40.0000 mg | DELAYED_RELEASE_TABLET | Freq: Every day | ORAL | Status: DC

## 2024-05-24 MED ORDER — FAMOTIDINE IN NACL 20-0.9 MG/50ML-% IV SOLN
20.0000 mg | INTRAVENOUS | Status: DC
  Administered 2024-05-24 – 2024-05-26 (×5): 20 mg via INTRAVENOUS
  Filled 2024-05-24 (×3): qty 50

## 2024-05-24 MED ORDER — PANTOPRAZOLE SODIUM 40 MG PO TBEC: 40.0000 mg | DELAYED_RELEASE_TABLET | Freq: Two times a day (BID) | ORAL | Status: DC

## 2024-05-24 MED ORDER — POLYETHYLENE GLYCOL 3350 17 G PO PACK
17.0000 g | PACK | Freq: Two times a day (BID) | ORAL | Status: DC
  Administered 2024-05-24 – 2024-05-26 (×7): 17 g via ORAL
  Filled 2024-05-24 (×5): qty 1

## 2024-05-24 MED ORDER — PANTOPRAZOLE SODIUM 40 MG IV SOLR
40.0000 mg | Freq: Two times a day (BID) | INTRAVENOUS | Status: DC
  Administered 2024-05-24 – 2024-05-26 (×7): 40 mg via INTRAVENOUS
  Filled 2024-05-24 (×4): qty 10

## 2024-05-24 MED ORDER — TERAZOSIN HCL 5 MG PO CAPS
10.0000 mg | ORAL_CAPSULE | Freq: Every day | ORAL | Status: DC
  Administered 2024-05-24 – 2024-05-25 (×4): 10 mg via ORAL
  Filled 2024-05-24 (×3): qty 2

## 2024-05-24 MED ORDER — ATORVASTATIN CALCIUM 40 MG PO TABS
40.0000 mg | ORAL_TABLET | Freq: Every day | ORAL | Status: DC
  Administered 2024-05-25 – 2024-05-26 (×3): 40 mg via ORAL
  Filled 2024-05-24 (×2): qty 1

## 2024-05-24 MED ORDER — LACTATED RINGERS IV BOLUS
500.0000 mL | Freq: Once | INTRAVENOUS | Status: AC
  Administered 2024-05-24 (×2): 500 mL via INTRAVENOUS

## 2024-05-24 NOTE — Plan of Care (Signed)

## 2024-05-24 NOTE — Progress Notes (Signed)
 Episode of heart rate running up to 150's however did not sustain. Patient asleep in bed . BP 104/53 , Map 68. Sinus with pac's . Informed Dr MARLA.

## 2024-05-24 NOTE — Progress Notes (Signed)
 SLP Cancellation Note  Patient Details Name: David Callahan MRN: [id number] DOB: 1956/02/07   Cancelled treatment:       Reason Eval/Treat Not Completed: Patient at procedure or test/unavailable. Will continue efforts.  Danford Tat L. Vona, MA CCC/SLP Clinical Specialist - Acute Care SLP Acute Rehabilitation Services Office number [phone]    Vona [name] 05/24/2024, 1:32 PM

## 2024-05-24 NOTE — Plan of Care (Signed)

## 2024-05-24 NOTE — Plan of Care (Signed)

## 2024-05-24 NOTE — Progress Notes (Signed)
 4 Days Post-Op   Subjective/Chief Complaint: Passing flatus, no bowel movements, NG out, tolerating clears   Objective: Vital signs in last 24 hours: Temp:  [97.7 F (36.5 C)-99.3 F (37.4 C)] 98.4 F (36.9 C) (08/12 0700) Pulse Rate:  [70-105] 81 (08/12 0700) Resp:  [13-23] 13 (08/12 0700) BP: (103-130)/(51-84) 116/55 (08/12 0804) SpO2:  [90 %-100 %] 98 % (08/12 0700) Last BM Date : 05/20/24  Intake/Output from previous day: 08/11 0701 - 08/12 0700 In: 1136.7 [I.V.:10; Blood:406.7; NG/GT:220; IV Piggyback:500] Out: 1780 [Urine:1750; Drains:30] Intake/Output this shift: Total I/O In: 130 [P.O.:120; I.V.:10] Out: 320 [Urine:300; Drains:20]  General appearance: alert and cooperative Resp: clear to auscultation bilaterally Cardio: regular rate and rhythm GI: soft, mild tenderness. JP serosanguinous  Lab Results:  Recent Labs    05/23/24 2133 05/24/24 0247  WBC 29.9* 25.5*  HGB 7.4* 7.1*  HCT 22.9* 22.3*  PLT 325 309   BMET Recent Labs    05/22/24 0200 05/23/24 0040  NA 140 137  K 4.0 4.0  CL 106 104  CO2 23 21*  GLUCOSE 112* 94  BUN 50* 34*  CREATININE 1.79* 1.24  CALCIUM  7.4* 7.4*   PT/INR No results for input(s): LABPROT, INR in the last 72 hours.  ABG No results for input(s): PHART, HCO3 in the last 72 hours.  Invalid input(s): PCO2, PO2   Studies/Results: DG CHEST PORT 1 VIEW Result Date: 05/24/2024 CLINICAL DATA:  Leukocytosis 100030 EXAM: PORTABLE CHEST 1 VIEW COMPARISON:  05/20/2024 FINDINGS: Endotracheal tube and nasogastric tube have been removed. No focal airspace disease. No focal lung consolidation. No pulmonary edema. Again noted are calcifications in the left mediastinum/hilar region. Severe degenerative changes involving the right shoulder. Heart size is within normal limits and stable. Trachea is midline. IMPRESSION: 1. No acute cardiopulmonary disease. 2. Endotracheal tube and nasogastric tube have been removed.  Electronically Signed   By: Juliene Balder M.D.   On: 05/24/2024 08:32   DG UGI W SINGLE CM (SOL OR THIN BA) Result Date: 05/23/2024 CLINICAL DATA:  Recent bleeding duodenal ulcer. Status post exploratory laparotomy and oversew of the gastroduodenal artery performed May 20, 2024. Request for upper GI series to evaluate for leak. EXAM: DG UGI W SINGLE CM TECHNIQUE: Scout radiograph was obtained. Single contrast examination was performed using water -soluble contrast. This exam was performed by Sari Lamp, PA-C, and was supervised and interpreted by Dr. Rockey Kilts. FLUOROSCOPY: Radiation Exposure Index (as provided by the fluoroscopic device): 23.3 mGy Kerma COMPARISON:  None Available. FINDINGS: Scout Radiograph: Nonobstructive bowel gas pattern. Surgical clips project over the proximal duodenum. Midline laparotomy staples. Nasogastric tube terminating in the proximal stomach with side port just below the gastroesophageal junction. Stomach: Normal appearance.  No mucosal lesion seen. Gastric emptying: Normal emptying without obstruction. Duodenum: Irregularity of the proximal duodenal lumen likely represents the site of treated ulcer and/or postoperative edema. Example on series 11. There is no extravasation of contrast seen to indicate leak. Other:  None. IMPRESSION: No contrast extravasation to suggest leak. Electronically Signed   By: Rockey Kilts M.D.   On: 05/23/2024 14:46     Anti-infectives: Anti-infectives (From admission, onward)    Start     Dose/Rate Route Frequency Ordered Stop   05/20/24 2200  ceFAZolin  (ANCEF ) IVPB 1 g/50 mL premix        1 g 100 mL/hr over 30 Minutes Intravenous Every 8 hours 05/20/24 1938 05/22/24 1411   05/20/24 1607  ceFAZolin  (ANCEF ) 2-4 GM/100ML-% IVPB  Note to Pharmacy: Cordella Area M: cabinet override      05/20/24 1607 05/21/24 0414       Assessment/Plan: Mr. Hinchliffe presented to [facility] with a UGI bleed which was unable to be controlled  endoscopically, so he underwent open oversew of duodenal ulcer on 05/20/24, with Dr. Evonnie.  UGI 8/11 reassuring JP drain with minimal output Full liquid diet today Hg stable Will follow  LOS: 6 days    Deward PARAS Jezebelle Ledwell 05/24/2024

## 2024-05-24 NOTE — TOC Progression Note (Addendum)
 Transition of Care [facility]) - Progression Note    Patient Details  Name: David Callahan MRN: [id number] Date of Birth: 10-22-1955  Transition of Care [name]  David KANDICE Stain, RN Phone Number: 05/24/2024, 12:15 PM  Clinical Narrative:    Patient is agreeable to home health. This RNCM offered choice for Home Health, David Callahan states he has no preference, RNCM made referral to [facility] with Hedda, He is able to take referral.  Patient is agreeable to walker. Notified David Callahan with [employer] of DME order. Patient's friend can transport home.  Address, Phone number and PCP verified.    Need home health PT,OT orders.     Expected Discharge Plan: Home w Home Health Services Barriers to Discharge: Continued Medical Work up               Expected Discharge Plan and Services In-house Referral: Clinical Social Work Discharge Planning Services: CM Consult Post Acute Care Choice: Home Health, Durable Medical Equipment Living arrangements for the past 2 months: Single Family Home                 DME Arranged: Vannie DME Agency: [employer] Date DME Agency Contacted: 05/24/24 Time DME Agency Contacted: [phone] Representative spoke with at DME Agency: David Callahan HH Arranged: PT, OT HH Agency: [facility] Date [facility] Agency Contacted: 05/24/24 Time HH Agency Contacted: 1215 Representative spoke with at [facility] Agency: [name]   Social Drivers of Health (SDOH) Interventions SDOH Screenings   Food Insecurity: No Food Insecurity (05/18/2024)  Housing: Low Risk  (05/18/2024)  Transportation Needs: No Transportation Needs (05/18/2024)  Utilities: Not At Risk (05/18/2024)  Social Connections: Socially Isolated (05/18/2024)  Tobacco Use: High Risk (05/20/2024)    Readmission Risk Interventions    05/18/2024    9:09 AM 04/07/2024    9:51 AM 04/05/2024   11:34 AM  Readmission Risk Prevention Plan  Transportation Screening Complete Complete Complete  Home Care Screening  Complete    Medication Review (RN CM)  Complete   HRI or Home Care Consult Complete  Complete  Social Work Consult for Recovery Care Planning/Counseling Complete  Complete  Palliative Care Screening Not Applicable  Not Applicable  Medication Review Oceanographer) Complete  Complete

## 2024-05-24 NOTE — Progress Notes (Signed)
 PT Cancellation Note  Patient Details Name: David Callahan MRN: [id number] DOB: 01/07/1956   Cancelled Treatment:    Reason Eval/Treat Not Completed: Patient at procedure or test/unavailable; going down for CT so will attempt later as schedule permits.    Montie Portal 05/24/2024, 1:04 PM Micheline Portal, PT Acute Rehabilitation Services Office:[phone] 05/24/2024

## 2024-05-25 DIAGNOSIS — K264 Chronic or unspecified duodenal ulcer with hemorrhage: Secondary | ICD-10-CM | POA: Diagnosis not present

## 2024-05-25 DIAGNOSIS — N179 Acute kidney failure, unspecified: Secondary | ICD-10-CM | POA: Diagnosis not present

## 2024-05-25 DIAGNOSIS — D500 Iron deficiency anemia secondary to blood loss (chronic): Secondary | ICD-10-CM | POA: Diagnosis not present

## 2024-05-25 DIAGNOSIS — I10 Essential (primary) hypertension: Secondary | ICD-10-CM | POA: Diagnosis not present

## 2024-05-25 LAB — CBC WITH DIFFERENTIAL/PLATELET
Abs Immature Granulocytes: 0.14 K/uL — ABNORMAL HIGH (ref 0.00–0.07)
Abs Immature Granulocytes: 0.16 K/uL — ABNORMAL HIGH (ref 0.00–0.07)
Basophils Absolute: 0 K/uL (ref 0.0–0.1)
Basophils Absolute: 0 K/uL (ref 0.0–0.1)
Basophils Relative: 0 %
Basophils Relative: 0 %
Eosinophils Absolute: 0.1 K/uL (ref 0.0–0.5)
Eosinophils Absolute: 0.1 K/uL (ref 0.0–0.5)
Eosinophils Relative: 1 %
Eosinophils Relative: 0 %
HCT: 22.5 % — ABNORMAL LOW (ref 39.0–52.0)
HCT: 23.4 % — ABNORMAL LOW (ref 39.0–52.0)
Hemoglobin: 7.2 g/dL — ABNORMAL LOW (ref 13.0–17.0)
Hemoglobin: 7.5 g/dL — ABNORMAL LOW (ref 13.0–17.0)
Immature Granulocytes: 1 %
Immature Granulocytes: 1 %
Lymphocytes Relative: 8 %
Lymphocytes Relative: 7 %
Lymphs Abs: 1.5 K/uL (ref 0.7–4.0)
Lymphs Abs: 1.2 K/uL (ref 0.7–4.0)
MCH: 29.9 pg (ref 26.0–34.0)
MCH: 30.2 pg (ref 26.0–34.0)
MCHC: 32 g/dL (ref 30.0–36.0)
MCHC: 32.1 g/dL (ref 30.0–36.0)
MCV: 93.4 fL (ref 80.0–100.0)
MCV: 94.4 fL (ref 80.0–100.0)
Monocytes Absolute: 1 K/uL (ref 0.1–1.0)
Monocytes Absolute: 0.9 K/uL (ref 0.1–1.0)
Monocytes Relative: 5 %
Monocytes Relative: 5 %
Neutro Abs: 15 K/uL — ABNORMAL HIGH (ref 1.7–7.7)
Neutro Abs: 16 K/uL — ABNORMAL HIGH (ref 1.7–7.7)
Neutrophils Relative %: 85 %
Neutrophils Relative %: 87 %
Platelets: 387 K/uL (ref 150–400)
Platelets: 422 K/uL — ABNORMAL HIGH (ref 150–400)
RBC: 2.41 MIL/uL — ABNORMAL LOW (ref 4.22–5.81)
RBC: 2.48 MIL/uL — ABNORMAL LOW (ref 4.22–5.81)
RDW: 18.2 % — ABNORMAL HIGH (ref 11.5–15.5)
RDW: 18.1 % — ABNORMAL HIGH (ref 11.5–15.5)
WBC: 17.7 K/uL — ABNORMAL HIGH (ref 4.0–10.5)
WBC: 18.3 K/uL — ABNORMAL HIGH (ref 4.0–10.5)
nRBC: 0 % (ref 0.0–0.2)
nRBC: 0 % (ref 0.0–0.2)

## 2024-05-25 LAB — HEMOGLOBIN AND HEMATOCRIT, BLOOD
HCT: 23.6 % — ABNORMAL LOW (ref 39.0–52.0)
HCT: 24.2 % — ABNORMAL LOW (ref 39.0–52.0)
Hemoglobin: 7.5 g/dL — ABNORMAL LOW (ref 13.0–17.0)
Hemoglobin: 7.7 g/dL — ABNORMAL LOW (ref 13.0–17.0)

## 2024-05-25 LAB — COMPREHENSIVE METABOLIC PANEL WITH GFR
ALT: 11 U/L (ref 0–44)
AST: 19 U/L (ref 15–41)
Albumin: 1.6 g/dL — ABNORMAL LOW (ref 3.5–5.0)
Alkaline Phosphatase: 55 U/L (ref 38–126)
Anion gap: 8 (ref 5–15)
BUN: 19 mg/dL (ref 8–23)
CO2: 26 mmol/L (ref 22–32)
Calcium: 7.9 mg/dL — ABNORMAL LOW (ref 8.9–10.3)
Chloride: 101 mmol/L (ref 98–111)
Creatinine, Ser: 0.96 mg/dL (ref 0.61–1.24)
GFR, Estimated: >60 mL/min (ref >60)
Glucose, Bld: 104 mg/dL — ABNORMAL HIGH (ref 70–99)
Potassium: 3.5 mmol/L (ref 3.5–5.1)
Sodium: 135 mmol/L (ref 135–145)
Total Bilirubin: 0.6 mg/dL (ref 0.0–1.2)
Total Protein: 4 g/dL — ABNORMAL LOW (ref 6.5–8.1)

## 2024-05-25 LAB — MAGNESIUM: Magnesium: 1.6 mg/dL — ABNORMAL LOW (ref 1.7–2.4)

## 2024-05-25 LAB — PHOSPHORUS: Phosphorus: 3 mg/dL (ref 2.5–4.6)

## 2024-05-25 MED ORDER — MAGNESIUM OXIDE -MG SUPPLEMENT 400 (240 MG) MG PO TABS
400.0000 mg | ORAL_TABLET | Freq: Every day | ORAL | Status: AC
  Administered 2024-05-25 – 2024-05-26 (×3): 400 mg via ORAL
  Filled 2024-05-25 (×2): qty 1

## 2024-05-25 NOTE — Progress Notes (Signed)
 PT Cancellation Note  Patient Details Name: MARIS BENA MRN: [id number] DOB: [birth year]   Cancelled Treatment:    Reason Eval/Treat Not Completed: (P) Other (comment) (pt bathing in room, requesting PTA return after next patient.) Will continue efforts per PT plan of care as schedule permits.   Amberlea Spagnuolo M Myka Hitz 05/25/2024, 1:01 PM

## 2024-05-25 NOTE — Progress Notes (Signed)
 SLP Cancellation Note  Patient Details Name: David Callahan MRN: [id number] DOB: [birth year]   Cancelled treatment:       Reason Eval/Treat Not Completed: SLP screened. Pt admitted for hematemesis s/p ex lap and duodenotomy. PT/OT state cognition appears WFL. A cognitive-linguistic evaluation is not warranted at this time, will sign off.   Damien Blumenthal, M.A., CCC-SLP Speech Language Pathology, Acute Rehabilitation Services  Secure Chat preferred [phone]  05/25/2024, 2:14 PM

## 2024-05-25 NOTE — Plan of Care (Signed)
  Problem: Health Behavior/Discharge Planning: Goal: Ability to manage health-related needs will improve Outcome: Progressing   Problem: Clinical Measurements: Goal: Will remain free from infection Outcome: Progressing Goal: Diagnostic test results will improve Outcome: Progressing   Problem: Activity: Goal: Risk for activity intolerance will decrease Outcome: Progressing   Problem: Nutrition: Goal: Adequate nutrition will be maintained Outcome: Progressing   Problem: Coping: Goal: Level of anxiety will decrease Outcome: Progressing

## 2024-05-25 NOTE — Care Management Important Message (Signed)
 Important Message  Patient Details  Name: David Callahan MRN: [id number] Date of Birth: 1956-02-14   Important Message Given:  Yes - Medicare IM     Claretta Deed 05/25/2024, 3:31 PM

## 2024-05-25 NOTE — Progress Notes (Signed)
 5 Days Post-Op   Subjective/Chief Complaint: Passing flatus, and had a maroon BM since surgery.  Hgb currently low but stable.  Tolerating FLD but doesn't like them.  Wants more to eat.     Objective: Vital signs in last 24 hours: Temp:  [97.9 F (36.6 C)-99.1 F (37.3 C)] 98.5 F (36.9 C) (08/13 0728) Pulse Rate:  [63-168] 65 (08/13 0728) Resp:  [16-20] 18 (08/13 0728) BP: (101-156)/(54-95) 130/63 (08/13 0900) SpO2:  [92 %-99 %] 96 % (08/13 0728) Last BM Date : 05/24/24  Intake/Output from previous day: 08/12 0701 - 08/13 0700 In: 895.7 [P.O.:840; I.V.:10; IV Piggyback:45.7] Out: 1620 [Urine:1400; Drains:220] Intake/Output this shift: Total I/O In: -  Out: 230 [Urine:200; Drains:30]  General appearance: alert and cooperative Resp: clear to auscultation bilaterally Cardio: regular rate and rhythm GI: soft, mild tenderness. JP serosanguinous, midline incision is c/d/I with honeycomb present  Lab Results:  Recent Labs    05/24/24 1818 05/24/24 2138 05/25/24 0541  WBC 28.2*  --  17.7*  HGB 7.8* 7.0* 7.2*  HCT 24.8* 21.5* 22.5*  PLT 399  --  387   BMET Recent Labs    05/24/24 1146 05/25/24 0541  NA 135 135  K 3.7 3.5  CL 100 101  CO2 24 26  GLUCOSE 94 104*  BUN 24* 19  CREATININE 1.12 0.96  CALCIUM  8.0* 7.9*   PT/INR No results for input(s): LABPROT, INR in the last 72 hours.  ABG No results for input(s): PHART, HCO3 in the last 72 hours.  Invalid input(s): PCO2, PO2   Studies/Results: CT ABDOMEN PELVIS WO CONTRAST Result Date: 05/24/2024 CLINICAL DATA:  Postoperative abdominal pain status post laparotomy EXAM: CT ABDOMEN AND PELVIS WITHOUT CONTRAST TECHNIQUE: Multidetector CT imaging of the abdomen and pelvis was performed following the standard protocol without IV contrast. RADIATION DOSE REDUCTION: This exam was performed according to the departmental dose-optimization program which includes automated exposure control, adjustment of the  mA and/or kV according to patient size and/or use of iterative reconstruction technique. COMPARISON:  CT a abdomen and pelvis 05/19/2024 FINDINGS: Lower chest: New patchy ground-glass attenuation airspace opacities in a peribronchovascular distribution throughout the dependent portions of the right lower lobe, and to a lesser extent the left lower lobe. Findings suggest small volume aspiration. Scattered calcified granulomas and left infrahilar lymph node. The intracardiac blood pool is hypodense relative to the adjacent myocardium consistent with anemia. Atherosclerotic calcifications along the coronary arteries and within the aorta. Hepatobiliary: No focal liver abnormality is seen. No gallstones, gallbladder wall thickening, or biliary dilatation. Punctate calcifications consistent with old granulomatous disease. Pancreas: Unremarkable. No pancreatic ductal dilatation or surrounding inflammatory changes. Spleen: Normal in size. Punctate calcifications throughout the spleen consistent with old granulomatous disease. Adrenals/Urinary Tract: Normal adrenal glands. No hydronephrosis, or nephrolithiasis. Unremarkable ureters and bladder. Stomach/Bowel: No evidence of bowel obstruction. Surgical changes along the proximal duodenum consistent with recent surgical intervention. A surgical drain is present in the left upper quadrant. No evidence of abscess, ascites or large free air. Vascular/Lymphatic: Limited evaluation in the absence of intravenous contrast. Mild aneurysmal dilation of the infrarenal abdominal aorta with a maximal diameter of 3.6 cm. No suspicious lymphadenopathy. Reproductive: Prostate is unremarkable. Other: No abdominal wall hernia or abnormality. No abdominopelvic ascites. Musculoskeletal: Chronic bilateral L5 pars defects with mild grade 1 anterolisthesis of L5 on S1. IMPRESSION: 1. Interval surgical changes of exploratory laparotomy, repair of bleeding duodenal ulcer and omental patch without  evidence of complication (no free air, ascites,  or abscess). 2. Well-positioned surgical drain in the right upper quadrant. 3. New patchy ground-glass airspace opacities in the right greater than left lower lobes concerning for low volume aspiration. 4. Aortic and coronary artery atherosclerotic vascular calcifications. 5. Evidence of old granulomatous disease affecting the spleen, liver, lungs and mediastinal lymph nodes. 6. Chronic bilateral L5 pars defects with associated mild grade 1 anterolisthesis of L5 on S1. 7. Infrarenal abdominal aortic aneurysm again noted. Maximal diameter 3.6 cm, no change compared to recent prior imaging from 05/19/2024. Agree with prior follow-up recommendations. Electronically Signed   By: Wilkie Lent Callahan.D.   On: 05/24/2024 12:56   DG CHEST PORT 1 VIEW Result Date: 05/24/2024 CLINICAL DATA:  Leukocytosis 100030 EXAM: PORTABLE CHEST 1 VIEW COMPARISON:  05/20/2024 FINDINGS: Endotracheal tube and nasogastric tube have been removed. No focal airspace disease. No focal lung consolidation. No pulmonary edema. Again noted are calcifications in the left mediastinum/hilar region. Severe degenerative changes involving the right shoulder. Heart size is within normal limits and stable. Trachea is midline. IMPRESSION: 1. No acute cardiopulmonary disease. 2. Endotracheal tube and nasogastric tube have been removed. Electronically Signed   By: Juliene Balder Callahan.D.   On: 05/24/2024 08:32   DG UGI W SINGLE CM (SOL OR THIN BA) Result Date: 05/23/2024 CLINICAL DATA:  Recent bleeding duodenal ulcer. Status post exploratory laparotomy and oversew of the gastroduodenal artery performed May 20, 2024. Request for upper GI series to evaluate for leak. EXAM: DG UGI W SINGLE CM TECHNIQUE: Scout radiograph was obtained. Single contrast examination was performed using water -soluble contrast. This exam was performed by Sari Lamp, PA-C, and was supervised and interpreted by Dr. Rockey Kilts. FLUOROSCOPY:  Radiation Exposure Index (as provided by the fluoroscopic device): 23.3 mGy Kerma COMPARISON:  None Available. FINDINGS: Scout Radiograph: Nonobstructive bowel gas pattern. Surgical clips project over the proximal duodenum. Midline laparotomy staples. Nasogastric tube terminating in the proximal stomach with side port just below the gastroesophageal junction. Stomach: Normal appearance.  No mucosal lesion seen. Gastric emptying: Normal emptying without obstruction. Duodenum: Irregularity of the proximal duodenal lumen likely represents the site of treated ulcer and/or postoperative edema. Example on series 11. There is no extravasation of contrast seen to indicate leak. Other:  None. IMPRESSION: No contrast extravasation to suggest leak. Electronically Signed   By: Rockey Kilts Callahan.D.   On: 05/23/2024 14:46     Anti-infectives: Anti-infectives (From admission, onward)    Start     Dose/Rate Route Frequency Ordered Stop   05/20/24 2200  ceFAZolin  (ANCEF ) IVPB 1 g/50 mL premix        1 g 100 mL/hr over 30 Minutes Intravenous Every 8 hours 05/20/24 1938 05/22/24 1411   05/20/24 1607  ceFAZolin  (ANCEF ) 2-4 GM/100ML-% IVPB       Note to Pharmacy: David Callahan: cabinet override      05/20/24 1607 05/21/24 0414       Assessment/Plan: David Callahan presented to [facility] with a UGI bleed which was unable to be controlled endoscopically, so he underwent open oversew of duodenal ulcer on 05/20/24, with Dr. Evonnie.  UGI 8/11 reassuring JP drain with minimal output soft diet today Hg stable this am Ambulating  Will follow  LOS: 7 days    David Callahan 05/25/2024

## 2024-05-25 NOTE — Progress Notes (Signed)
 Occupational Therapy Treatment Patient Details Name: David Callahan MRN: [id number] DOB: 1956/09/15 Today's Date: 05/25/2024   History of present illness Patient is a 68 yo male presenting to the ED with hematemesis and bloody stools on 05/18/24. EGD on 8/8 showing large duodenal ulcer. Ex-lap, control of bleeding duodenal ulcer, and omental patch over duodenotomy completed on 8/9. PMH includes: COPD, hypertension, PUD, RLS, anxiety.   OT comments  Patient received in supine and agreeable to OT treatment. Patient able to get to EOB with CGA and required setup to donn gown to cover back and mod assist to donn socks. Patient states he uses tongs to assist with dressing at home and COTA discussed reacher use but patient states he will continue to use the tongs. Patient ambulated to sink for grooming tasks and required cues for hand placement for sit to stands and safe walker use. Patient asked to ambulate in hallway before completing OT in chair.  Discharge recommendations continue to be appropriate. Acute OT to continue to follow to address established goals.       If plan is discharge home, recommend the following:  A little help with walking and/or transfers;A little help with bathing/dressing/bathroom;Assist for transportation;Help with stairs or ramp for entrance   Equipment Recommendations  None recommended by OT    Recommendations for Other Services      Precautions / Restrictions Precautions Precautions: Fall Precaution/Restrictions Comments: watch HR, watch BP, R JP Drain Restrictions Weight Bearing Restrictions Per Provider Order: No       Mobility Bed Mobility Overal bed mobility: Needs Assistance Bed Mobility: Supine to Sit     Supine to sit: Contact guard, HOB elevated     General bed mobility comments: increased time    Transfers Overall transfer level: Needs assistance Equipment used: Rolling walker (2 wheels) Transfers: Sit to/from Stand Sit to Stand: Min  assist, From elevated surface           General transfer comment: cues for hand placement and min assist for stability     Balance Overall balance assessment: Needs assistance Sitting-balance support: Feet supported Sitting balance-Leahy Scale: Good Sitting balance - Comments: EOB   Standing balance support: Bilateral upper extremity supported Standing balance-Leahy Scale: Poor Standing balance comment: reliant on BUE support                           ADL either performed or assessed with clinical judgement   ADL Overall ADL's : Needs assistance/impaired     Grooming: Set up;Sitting           Upper Body Dressing : Set up;Sitting Upper Body Dressing Details (indicate cue type and reason): gown for back Lower Body Dressing: Moderate assistance;Sitting/lateral leans;Sit to/from stand Lower Body Dressing Details (indicate cue type and reason): for socks Toilet Transfer: Minimal assistance;Ambulation;Rolling walker (2 wheels) Toilet Transfer Details (indicate cue type and reason): simulated           General ADL Comments: declined bathing tasks stating it was too cold to wash up    Extremity/Trunk Assessment              Vision       Perception     Praxis     Communication Communication Communication: No apparent difficulties   Cognition Arousal: Alert Behavior During Therapy: WFL for tasks assessed/performed Cognition: No apparent impairments  Following commands: Intact        Cueing   Cueing Techniques: Verbal cues  Exercises      Shoulder Instructions       General Comments HR increased to 90s with mobility, BP in supine 130/63, EOB 138/62, standing 142/64, end of session seated in chair 128/53    Pertinent Vitals/ Pain       Pain Assessment Pain Assessment: Faces Faces Pain Scale: Hurts little more Pain Location: knees Pain Descriptors / Indicators: Aching, Grimacing Pain  Intervention(s): Limited activity within patient's tolerance, Monitored during session, Repositioned  Home Living                                          Prior Functioning/Environment              Frequency  Min 2X/week        Progress Toward Goals  OT Goals(current goals can now be found in the care plan section)  Progress towards OT goals: Progressing toward goals  Acute Rehab OT Goals Patient Stated Goal: to go home OT Goal Formulation: With patient Time For Goal Achievement: 06/06/24 Potential to Achieve Goals: Good ADL Goals Pt Will Perform Lower Body Bathing: with modified independence;sitting/lateral leans;sit to/from stand Pt Will Perform Lower Body Dressing: with modified independence;sit to/from stand;sitting/lateral leans Pt Will Transfer to Toilet: with modified independence;regular height toilet;ambulating Pt Will Perform Toileting - Clothing Manipulation and hygiene: with modified independence;sit to/from stand;sitting/lateral leans Pt/caregiver will Perform Home Exercise Program: Increased strength;Both right and left upper extremity;With theraband;With written HEP provided;Independently Additional ADL Goal #1: Patient will be able to complete functional task in standing for 5-7 minutes to increase overall activity tolerance.  Plan      Co-evaluation                 AM-PAC OT 6 Clicks Daily Activity     Outcome Measure   Help from another person eating meals?: A Little Help from another person taking care of personal grooming?: A Little Help from another person toileting, which includes using toliet, bedpan, or urinal?: A Lot Help from another person bathing (including washing, rinsing, drying)?: A Lot Help from another person to put on and taking off regular upper body clothing?: A Little Help from another person to put on and taking off regular lower body clothing?: A Lot 6 Click Score: 15    End of Session Equipment  Utilized During Treatment: Gait belt;Rolling walker (2 wheels)  OT Visit Diagnosis: Unsteadiness on feet (R26.81);Other abnormalities of gait and mobility (R26.89);Muscle weakness (generalized) (M62.81);Pain Pain - Right/Left: Right Pain - part of body: Knee   Activity Tolerance Patient tolerated treatment well   Patient Left in chair;with call bell/phone within reach;with chair alarm set   Nurse Communication Mobility status        Time: 9278-9248 OT Time Calculation (min): 30 min  Charges: OT General Charges $OT Visit: 1 Visit OT Treatments $Self Care/Home Management : 8-22 mins $Therapeutic Activity: 8-22 mins  Dick Laine, OTA Acute Rehabilitation Services  Office [phone]   Jeb LITTIE Laine 05/25/2024, 11:08 AM

## 2024-05-25 NOTE — Progress Notes (Signed)
 PT Cancellation Note  Patient Details Name: David Callahan MRN: [id number] DOB: 09-27-1956   Cancelled Treatment:    Reason Eval/Treat Not Completed: (P) Other (comment) (pt bathing in room, requesting PTA return after next patient.) Will continue efforts per PT plan of care as schedule permits.   Skylen Danielsen M Durga Saldarriaga 05/25/2024, 1:01 PM

## 2024-05-25 NOTE — Progress Notes (Signed)
 Physical Therapy Treatment Patient Details Name: David Callahan MRN: [id number] DOB: [birth year] Today's Date: 05/25/2024   History of Present Illness Patient is a 68 yo male presenting to the ED with hematemesis and bloody stools on 05/18/24. EGD on 8/8 showing large duodenal ulcer. Ex-lap, control of bleeding duodenal ulcer, and omental patch over duodenotomy completed on 8/9. PMH includes: COPD, hypertension, PUD, RLS, anxiety.    PT Comments  Pt received in chair, agreeable to therapy session and with good participation and tolerance for transfer, gait and stair training with L rail. Pt needing up to CGA to perform functional mobility tasks and reliant on BUE support of RW due to c/o chronic R knee pain. Pt may benefit from hinged knee support brace or compression brace due to c/o R knee pain and significant R knee genu varum. Pt continues to benefit from PT services to progress toward functional mobility goals, continue to recommend HHPT, pt reports no family or friends to help him at home and is interested in any resources for financial help in installing a ramp for home entry in the future.     If plan is discharge home, recommend the following: A little help with walking and/or transfers;A little help with bathing/dressing/bathroom;Help with stairs or ramp for entrance;Assist for transportation   Can travel by private vehicle        Equipment Recommendations  Rolling walker (2 wheels);Other (comment) (pt asking about knee brace -may benefit from orthotist consult for hinged knee brace or OP consult?)    Recommendations for Other Services       Precautions / Restrictions Precautions Precautions: Fall;Other (comment) (abdominal protection precs) Recall of Precautions/Restrictions: Intact Precaution/Restrictions Comments: watch HR, watch BP, R JP Drain, midline incision Restrictions Weight Bearing Restrictions Per Provider Order: No     Mobility  Bed Mobility Overal bed mobility:  Needs Assistance             General bed mobility comments: pt received up in chair    Transfers Overall transfer level: Needs assistance Equipment used: Rolling walker (2 wheels) Transfers: Sit to/from Stand Sit to Stand: Contact guard assist           General transfer comment: from regular chair with arm rests (pt reports recliner is less comfortable for him) x2 reps from chair which was adjactent to wall so preventing it from possibility of sliding back    Ambulation/Gait Ambulation/Gait assistance: Contact guard assist Gait Distance (Feet): 150 Feet Assistive device: Rolling walker (2 wheels) Gait Pattern/deviations: Step-to pattern, Antalgic, Decreased stride length, Decreased stance time - right, Trunk flexed, Decreased weight shift to right, Wide base of support       General Gait Details: Noted L hip crepitus at times with ambulation but pt denies L hip pain, reports R knee>L knee pain and pt encouraged to keep hips closer within RW for using UE to offload RLE. Antalgic pattern with significant R knee genu varum, pt requesting brace for R knee or compression, may need to consult orthotist; SpO2/HR WFL on RA.   Stairs Stairs: Yes Stairs assistance: Contact guard assist Stair Management: One rail Left, Step to pattern, Forwards, Backwards Number of Stairs: 4 General stair comments: single 7.5 platform step with L rail, pt stepping up with LUE on rail then reaches RUE onto L rail also to pull up more fully, x4 reps to simulate home entry. No buckling. Cued to lead with LLE due to pt c/o R knee pain/weakness.   Wheelchair Mobility  Tilt Bed    Modified Rankin (Stroke Patients Only)       Balance Overall balance assessment: Needs assistance Sitting-balance support: Feet supported Sitting balance-Leahy Scale: Good Sitting balance - Comments: EOB   Standing balance support: Bilateral upper extremity supported Standing balance-Leahy Scale: Poor Standing  balance comment: reliant on BUE support                            Communication Communication Communication: No apparent difficulties  Cognition Arousal: Alert Behavior During Therapy: WFL for tasks assessed/performed   PT - Cognitive impairments: No apparent impairments                         Following commands: Intact      Cueing Cueing Techniques: Verbal cues  Exercises      General Comments General comments (skin integrity, edema, etc.): HR 90's bpm standing, 85 bpm sitting; SpO2 96-97% on RA      Pertinent Vitals/Pain Pain Assessment Pain Assessment: Faces Faces Pain Scale: Hurts little more Pain Location: knees, R>L, midline incision Pain Descriptors / Indicators: Aching, Grimacing, Guarding Pain Intervention(s): Limited activity within patient's tolerance, Monitored during session, Repositioned, Premedicated before session, Other (comment) (pt reports ice exacerbates his R knee pain)    Home Living                          Prior Function            PT Goals (current goals can now be found in the care plan section) Acute Rehab PT Goals Patient Stated Goal: to go home PT Goal Formulation: With patient Time For Goal Achievement: 06/06/24 Progress towards PT goals: Progressing toward goals    Frequency    Min 3X/week      PT Plan      Co-evaluation              AM-PAC PT 6 Clicks Mobility   Outcome Measure  Help needed turning from your back to your side while in a flat bed without using bedrails?: A Little Help needed moving from lying on your back to sitting on the side of a flat bed without using bedrails?: A Little Help needed moving to and from a bed to a chair (including a wheelchair)?: A Little Help needed standing up from a chair using your arms (e.g., wheelchair or bedside chair)?: A Little Help needed to walk in hospital room?: A Little Help needed climbing 3-5 steps with a railing? : A Little 6  Click Score: 18    End of Session Equipment Utilized During Treatment: Gait belt Activity Tolerance: Patient tolerated treatment well;Patient limited by pain Patient left: in chair;with call bell/phone within reach;with chair alarm set;Other (comment) (on regular chair but foot rest with pillow under his legs to keep them slightly elevated) Nurse Communication: Mobility status PT Visit Diagnosis: Other abnormalities of gait and mobility (R26.89);Muscle weakness (generalized) (M62.81)     Time: 8586-8566 PT Time Calculation (min) (ACUTE ONLY): 20 min  Charges:    $Gait Training: 8-22 mins PT General Charges $$ ACUTE PT VISIT: 1 Visit                     Cleto Claggett P., PTA Acute Rehabilitation Services Secure Chat Preferred 9a-5:30pm Office: [phone]    Connell HERO [facility] 05/25/2024, 4:10 PM

## 2024-05-25 NOTE — Plan of Care (Signed)

## 2024-05-25 NOTE — Progress Notes (Signed)
 Progress Note   Patient: David Callahan FMW:[id number] DOB: [birth year] DOA: 05/18/2024     7 DOS: the patient was seen and examined on 05/25/2024   Brief hospital course: 68 y.o. male with medical history significant for COPD, hypertension, and peptic ulcer disease who presents with hematemesis, black stools, and exertional dyspnea.  Found to have a duodenal ulcer on EGD but this could not be stopped with bleeding so went to the OR emergently and underwent exploratory laparotomy to control his duodenal bleeding ulcer and had an omental patch over the do ectomy.  He is postop and had to be transferred to the unit and was intubated and subsequently extubated on 05/21/2024. Transferred to [facility] Service on 05/23/24.  Blood count is continued to drop slowly so he is already status post 13 units of PRBCs and only received one of the 2 units ordered yesterday.  White count continues to worsen but is now improving.  CT abdomen and pelvis was done and showed no evidence of infection.   Yesterday he had a large black bowel movement, case discussed with GI, general surgery who did not recommend any intervention, advised to transfuse for hemoglobin less than 7.  Assessment and Plan: Acute upper GI bleed due to duodenal ulcer Acute blood loss anemia Hemorrhagic shock- Status post ex lap 05/20/2024- EGD showed large duodenal ulcer, bleeding unable to be stopped. He was taken to the OR underwent exploratory laparotomy and controlling of his duodenal bleeding ulcer with omental patch and duodenectomy closure. Postop in ICU transferred to [facility] service 05/23/2024. Currently hemoglobin stable.  H&H Q4 changed to Q2 hourly. Advance diet as per surgery team. PT OT recommending home health services.  Leukocytosis: Likely reactive in the setting of GI bleed. WBC trending down.  WBC 18.3 today. Blood cultures so far negative.  Chest x-ray unremarkable. Continue to trend CBC.  Acute kidney injury/metabolic acidosis- Kidney  function improved with IV fluids, blood transfusion. Metabolic acidosis resolved.  BPH: Terazosin  10 mg per Tube daily has been resumed   HLD: C/w Atorvastatin  40 mg per Tube Daily    Essential Hypertension: Was hypotensive on admission due to shock and Antihypertensives were held but blood pressures improved. Now Continuing Amlodipine  5 mg p.o. daily. C/w IV Hydralazine  10 mg q4hprn for SBP >180. CTM BP per Protocol. Last BP reading was 118/61   COPD: Not in exacerbation. On Incruse Ellipta  and as-needed short-acting bronchodilators. C/w Arformoterol  15 mcg neb BID and Revefenacin  175 mcg Neb Daily. C/w  DuoNeb 3 mL Neb q6hprn Wheezing. Repeat CXR in the AM   GERD/GI Prophylaxis: C/w PPI with pantoprazole  and changed to IV 40 mg every 12   Hypoalbuminemia: Patient's Albumin  Lvl went from 3.0 -> 2.4 -> 1.8 -> <1.5. CTM and Trend and repeat CMP in the AM       Out of bed to chair. Incentive spirometry. Nursing supportive care. Fall, aspiration precautions. Diet:  Diet Orders (From admission, onward)     Start     Ordered   05/25/24 0929  DIET SOFT Room service appropriate? Yes; Fluid consistency: Thin  Diet effective now       Question Answer Comment  Room service appropriate? Yes   Fluid consistency: Thin      05/25/24 0929           DVT prophylaxis: SCDs Start: 05/18/24 9394  Level of care: Progressive   Code Status: Full Code  Subjective: Patient is seen and examined today morning.  Upset as he  is being poked several times for lab work.  Able to tolerate clears.  Sitting in the chair.  No complaints.  Physical Exam: Vitals:   05/25/24 0728 05/25/24 0900 05/25/24 1110 05/25/24 1522  BP: 130/63 130/63 107/63 130/63  Pulse: 65  72 74  Resp: 18  16 16   Temp: 98.5 F (36.9 C)  98.3 F (36.8 C) 97.6 F (36.4 C)  TempSrc: Oral  Oral Oral  SpO2: 96%  96% 96%  Weight:      Height:        General - Elderly Caucasian male, no apparent distress HEENT - PERRLA,  EOMI, atraumatic head, non tender sinuses. Lung - Clear, basal rales, no rhonchi, wheezes. Heart - S1, S2 heard, no murmurs, rubs, trace pedal edema. Abdomen - Soft, non tender, JP drain in place, bowel sounds good Neuro - Alert, awake and oriented x 3, non focal exam. Skin - Warm and dry.  Data Reviewed:      Latest Ref Rng & Units 05/25/2024    2:11 PM 05/25/2024    8:14 AM 05/25/2024    5:41 AM  CBC  WBC 4.0 - 10.5 K/uL  18.3  17.7   Hemoglobin 13.0 - 17.0 g/dL 7.5  7.5  7.2   Hematocrit 39.0 - 52.0 % 23.6  23.4  22.5   Platelets 150 - 400 K/uL  422  387       Latest Ref Rng & Units 05/25/2024    5:41 AM 05/24/2024   11:46 AM 05/23/2024   12:40 AM  BMP  Glucose 70 - 99 mg/dL 895  94  94   BUN 8 - 23 mg/dL 19  24  34   Creatinine 0.61 - 1.24 mg/dL 9.03  8.87  8.75   Sodium 135 - 145 mmol/L 135  135  137   Potassium 3.5 - 5.1 mmol/L 3.5  3.7  4.0   Chloride 98 - 111 mmol/L 101  100  104   CO2 22 - 32 mmol/L 26  24  21    Calcium  8.9 - 10.3 mg/dL 7.9  8.0  7.4    CT ABDOMEN PELVIS WO CONTRAST Result Date: 05/24/2024 CLINICAL DATA:  Postoperative abdominal pain status post laparotomy EXAM: CT ABDOMEN AND PELVIS WITHOUT CONTRAST TECHNIQUE: Multidetector CT imaging of the abdomen and pelvis was performed following the standard protocol without IV contrast. RADIATION DOSE REDUCTION: This exam was performed according to the departmental dose-optimization program which includes automated exposure control, adjustment of the mA and/or kV according to patient size and/or use of iterative reconstruction technique. COMPARISON:  CT a abdomen and pelvis 05/19/2024 FINDINGS: Lower chest: New patchy ground-glass attenuation airspace opacities in a peribronchovascular distribution throughout the dependent portions of the right lower lobe, and to a lesser extent the left lower lobe. Findings suggest small volume aspiration. Scattered calcified granulomas and left infrahilar lymph node. The intracardiac  blood pool is hypodense relative to the adjacent myocardium consistent with anemia. Atherosclerotic calcifications along the coronary arteries and within the aorta. Hepatobiliary: No focal liver abnormality is seen. No gallstones, gallbladder wall thickening, or biliary dilatation. Punctate calcifications consistent with old granulomatous disease. Pancreas: Unremarkable. No pancreatic ductal dilatation or surrounding inflammatory changes. Spleen: Normal in size. Punctate calcifications throughout the spleen consistent with old granulomatous disease. Adrenals/Urinary Tract: Normal adrenal glands. No hydronephrosis, or nephrolithiasis. Unremarkable ureters and bladder. Stomach/Bowel: No evidence of bowel obstruction. Surgical changes along the proximal duodenum consistent with recent surgical intervention. A surgical drain is present in  the left upper quadrant. No evidence of abscess, ascites or large free air. Vascular/Lymphatic: Limited evaluation in the absence of intravenous contrast. Mild aneurysmal dilation of the infrarenal abdominal aorta with a maximal diameter of 3.6 cm. No suspicious lymphadenopathy. Reproductive: Prostate is unremarkable. Other: No abdominal wall hernia or abnormality. No abdominopelvic ascites. Musculoskeletal: Chronic bilateral L5 pars defects with mild grade 1 anterolisthesis of L5 on S1. IMPRESSION: 1. Interval surgical changes of exploratory laparotomy, repair of bleeding duodenal ulcer and omental patch without evidence of complication (no free air, ascites, or abscess). 2. Well-positioned surgical drain in the right upper quadrant. 3. New patchy ground-glass airspace opacities in the right greater than left lower lobes concerning for low volume aspiration. 4. Aortic and coronary artery atherosclerotic vascular calcifications. 5. Evidence of old granulomatous disease affecting the spleen, liver, lungs and mediastinal lymph nodes. 6. Chronic bilateral L5 pars defects with associated  mild grade 1 anterolisthesis of L5 on S1. 7. Infrarenal abdominal aortic aneurysm again noted. Maximal diameter 3.6 cm, no change compared to recent prior imaging from 05/19/2024. Agree with prior follow-up recommendations. Electronically Signed   By: Wilkie Lent M.D.   On: 05/24/2024 12:56   DG CHEST PORT 1 VIEW Result Date: 05/24/2024 CLINICAL DATA:  Leukocytosis 100030 EXAM: PORTABLE CHEST 1 VIEW COMPARISON:  05/20/2024 FINDINGS: Endotracheal tube and nasogastric tube have been removed. No focal airspace disease. No focal lung consolidation. No pulmonary edema. Again noted are calcifications in the left mediastinum/hilar region. Severe degenerative changes involving the right shoulder. Heart size is within normal limits and stable. Trachea is midline. IMPRESSION: 1. No acute cardiopulmonary disease. 2. Endotracheal tube and nasogastric tube have been removed. Electronically Signed   By: Juliene Balder M.D.   On: 05/24/2024 08:32    Family Communication: Discussed with patient, understand and agree. All questions answered.  Disposition: Status is: Inpatient Remains inpatient appropriate because: Severity of illness, monitor H&H, advance diet  Planned Discharge Destination: Home with Home Health     Time spent: 44 minutes  Author: Concepcion Riser, MD 05/25/2024 3:52 PM Secure chat 7am to 7pm For on call review [email or web address].

## 2024-05-26 DIAGNOSIS — Z9889 Other specified postprocedural states: Secondary | ICD-10-CM

## 2024-05-26 DIAGNOSIS — I469 Cardiac arrest, cause unspecified: Secondary | ICD-10-CM

## 2024-05-26 LAB — CBC WITH DIFFERENTIAL/PLATELET
Abs Immature Granulocytes: 0.1 K/uL — ABNORMAL HIGH (ref 0.00–0.07)
Basophils Absolute: 0 K/uL (ref 0.0–0.1)
Basophils Relative: 0 %
Eosinophils Absolute: 0.1 K/uL (ref 0.0–0.5)
Eosinophils Relative: 1 %
HCT: 24.5 % — ABNORMAL LOW (ref 39.0–52.0)
Hemoglobin: 7.7 g/dL — ABNORMAL LOW (ref 13.0–17.0)
Immature Granulocytes: 1 %
Lymphocytes Relative: 9 %
Lymphs Abs: 1.3 K/uL (ref 0.7–4.0)
MCH: 29.7 pg (ref 26.0–34.0)
MCHC: 31.4 g/dL (ref 30.0–36.0)
MCV: 94.6 fL (ref 80.0–100.0)
Monocytes Absolute: 0.8 K/uL (ref 0.1–1.0)
Monocytes Relative: 6 %
Neutro Abs: 12 K/uL — ABNORMAL HIGH (ref 1.7–7.7)
Neutrophils Relative %: 83 %
Platelets: 507 K/uL — ABNORMAL HIGH (ref 150–400)
RBC: 2.59 MIL/uL — ABNORMAL LOW (ref 4.22–5.81)
RDW: 17.7 % — ABNORMAL HIGH (ref 11.5–15.5)
WBC: 14.3 K/uL — ABNORMAL HIGH (ref 4.0–10.5)
nRBC: 0 % (ref 0.0–0.2)

## 2024-05-26 LAB — BPAM RBC
Blood Product Expiration Date: 202509072359
Blood Product Expiration Date: 202509092359
ISSUE DATE / TIME: 202508111434
ISSUE DATE / TIME: 202508111641
Unit Type and Rh: 5100
Unit Type and Rh: 5100

## 2024-05-26 LAB — TYPE AND SCREEN
ABO/RH(D): O POS
Antibody Screen: NEGATIVE
Unit division: 0
Unit division: 0

## 2024-05-26 MED ORDER — TERAZOSIN HCL 10 MG PO CAPS: 10.0000 mg | ORAL_CAPSULE | Freq: Every day | ORAL | Status: AC

## 2024-05-26 MED ORDER — SUCRALFATE 1 GM/10ML PO SUSP: 1.0000 g | Freq: Three times a day (TID) | ORAL | 0 refills | Status: AC

## 2024-05-26 MED ORDER — DILTIAZEM HCL ER COATED BEADS 120 MG PO CP24: 120.0000 mg | ORAL_CAPSULE | Freq: Every day | ORAL | 1 refills | Status: DC

## 2024-05-26 NOTE — Discharge Summary (Signed)
 Physician Discharge Summary   Patient: David Callahan MRN: [id number] DOB: [birth year]  Admit date:     05/18/2024  Discharge date: 05/26/24  Discharge Physician: Concepcion Riser   PCP: [name], NP   Recommendations at discharge:    PCP follow up in 1 week. Follow CBC in a week. Keep a log of BP for titration of BP meds. Follow surgery clinic for drain management. GI follow up suggested.  Discharge Diagnoses: Principal Problem:   Acute upper GI bleed Active Problems:   COPD (chronic obstructive pulmonary disease) (HCC)   AKI (acute kidney injury) (HCC)   Essential hypertension   Iron  deficiency anemia due to chronic blood loss   Cardiac arrest (HCC)   Bleeding duodenal ulcer   UGI bleed  Resolved Problems:   * No resolved hospital problems. *  Hospital Course: 68 y.o. male with medical history significant for COPD, hypertension, and peptic ulcer disease who presents with hematemesis, black stools, and exertional dyspnea.  Found to have a duodenal ulcer on EGD but this could not be stopped with bleeding so went to the OR emergently and underwent exploratory laparotomy to control his duodenal bleeding ulcer and had an omental patch over the duodenotomy closure.  Postop transferred to the unit and was intubated and subsequently extubated on 05/21/2024. Transferred to [facility] on 05/23/24.  Blood count is continued to drop slowly so he is already status post 13 units of PRBCs.  WBC count now improving.  CT abdomen and pelvis showed no evidence of infection. Hb remained stable, surgery service advised outpatient follow up, JP drain education provided. Advised PCP follow up upon discharge.    Assessment and Plan: Acute upper GI bleed due to duodenal ulcer Acute blood loss anemia Hemorrhagic shock- Status post ex lap 05/20/2024- EGD showed large duodenal ulcer, bleeding unable to be stopped. He was taken to the OR underwent exploratory laparotomy and controlling of his  duodenal bleeding ulcer with omental patch and duodenotomy closure. Postop in ICU transferred to [facility] service 05/23/2024. Currently hemoglobin stable.  H&H monitoring as outpatient in a week.. Advanced diet as per surgery team, tolerating well. He got JP drain education. Stable to be discharged with PCP, surgery clinic follow up.  Leukocytosis: Likely reactive in the setting of GI bleed. WBC trending down, 14.3 today. Blood cultures so far negative.  Chest x-ray unremarkable. CBC as outpatient in a week.   Acute kidney injury/metabolic acidosis- Kidney function improved with IV fluids, blood transfusion. Metabolic acidosis resolved.   BPH: Terazosin  10 mg per Tube daily has been resumed   HLD: C/w Atorvastatin  40 mg per Tube Daily    Essential Hypertension: Was hypotensive on admission due to shock and Antihypertensives were held but blood pressures improved. Resumed low dose Cardizem . Held Benicar. Outpatient follow up for antihypertensive med titration.   COPD: Not in exacerbation. On Incruse Ellipta  and as-needed short-acting bronchodilators. C/w Arformoterol  15 mcg neb BID and Revefenacin  175 mcg Neb Daily.    GERD/GI Prophylaxis: C/w PPI bid.  Hypoalbuminemia: encourage oral diet, supplements.        Consultants: GI, surgery Procedures performed: Exploratory laparotomy, control of bleeding duodenal ulcer, Omental patch over duodenotomy closure   Disposition: Home health Diet recommendation:  Discharge Diet Orders (From admission, onward)     Start     Ordered   05/26/24 0000  Diet - low sodium heart healthy        05/26/24 1628  Carb modified diet DISCHARGE MEDICATION: Allergies as of 05/26/2024       Reactions   Motrin [ibuprofen] Anaphylaxis, Swelling   Anti-inflammatory analgesics- cause anaphylaxis        Medication List     STOP taking these medications    benazepril  40 MG tablet Commonly known as: LOTENSIN    cyclobenzaprine  10 MG  tablet Commonly known as: FLEXERIL        TAKE these medications    albuterol  108 (90 Base) MCG/ACT inhaler Commonly known as: VENTOLIN  HFA Inhale 2 puffs into the lungs every 6 (six) hours as needed for shortness of breath.   atorvastatin  40 MG tablet Commonly known as: LIPITOR Take 40 mg by mouth daily.   diltiazem  120 MG 24 hr capsule Commonly known as: CARDIZEM  CD Take 1 capsule (120 mg total) by mouth daily. What changed:  medication strength how much to take   esomeprazole  40 MG capsule Commonly known as: NexIUM  Take 1 capsule (40 mg total) by mouth 2 (two) times daily before a meal.   feeding supplement (ENSURE COMPLETE) Liqd Take 237 mLs by mouth 2 (two) times daily between meals.   ferrous sulfate  325 (65 FE) MG tablet Take 325 mg by mouth daily with breakfast.   HYDROcodone -acetaminophen  10-325 MG tablet Commonly known as: NORCO Take 1 tablet by mouth every 4 (four) hours as needed for moderate pain (pain score 4-6).   Incruse Ellipta  62.5 MCG/ACT Aepb Generic drug: umeclidinium bromide  Inhale 1 puff into the lungs daily.   rOPINIRole  1 MG tablet Commonly known as: REQUIP  Take 1 mg by mouth daily.   sucralfate  1 GM/10ML suspension Commonly known as: CARAFATE  Take 10 mLs (1 g total) by mouth 4 (four) times daily -  with meals and at bedtime for 28 days.   terazosin  10 MG capsule Commonly known as: HYTRIN  Take 1 capsule (10 mg total) by mouth at bedtime. What changed: when to take this   traZODone  150 MG tablet Commonly known as: DESYREL  Take 150 mg by mouth at bedtime as needed for sleep.               Durable Medical Equipment  (From admission, onward)           Start     Ordered   05/24/24 1416  For home use only DME Walker  Once       Question:  Patient needs a walker to treat with the following condition  Answer:  Weakness   05/24/24 1415              Discharge Care Instructions  (From admission, onward)            Start     Ordered   05/26/24 0000  Discharge wound care:       Comments: JP drain care education provided   05/26/24 1628   05/26/24 0000  Leave dressing on - Keep it clean, dry, and intact until clinic visit        05/26/24 1628            Follow-up Information     [facility] Follow up.   Specialty: Home Health Services Why: Home health has been arranged. They will reach out to schedule apt within 48hrs post discharge. Contact information: [address] [place] [address] [phone]         Pappayliou, Dorothyann LABOR, DO. Call in 1 week(s).   Specialty: General Surgery Why: for staple removal Contact  information: [address] [place] [facility] [address] [phone]                Discharge Exam: Filed Weights   05/18/24 0751 05/20/24 1342 05/20/24 1941  Weight: 79.4 kg 79.4 kg 79.2 kg      05/26/2024    3:18 PM 05/26/2024   11:23 AM 05/26/2024    8:51 AM  Vitals with BMI  Systolic 102 90   Diastolic 62 56   Pulse 70 75 84    General - Elderly Caucasian male, no apparent distress HEENT - PERRLA, EOMI, atraumatic head, non tender sinuses. Lung - Clear, basal rales, no rhonchi, wheezes. Heart - S1, S2 heard, no murmurs, rubs, trace pedal edema. Abdomen - Soft, non tender, JP drain in place, bowel sounds good Neuro - Alert, awake and oriented x 3, non focal exam. Skin - Warm and dry.  Condition at discharge: stable  The results of significant diagnostics from this hospitalization (including imaging, microbiology, ancillary and laboratory) are listed below for reference.   Imaging Studies: CT ABDOMEN PELVIS WO CONTRAST Result Date: 05/24/2024 CLINICAL DATA:  Postoperative abdominal pain status post laparotomy EXAM: CT ABDOMEN AND PELVIS WITHOUT CONTRAST TECHNIQUE: Multidetector CT imaging of the abdomen and pelvis was performed following the standard protocol without IV contrast. RADIATION DOSE REDUCTION: This exam was  performed according to the departmental dose-optimization program which includes automated exposure control, adjustment of the mA and/or kV according to patient size and/or use of iterative reconstruction technique. COMPARISON:  CT a abdomen and pelvis 05/19/2024 FINDINGS: Lower chest: New patchy ground-glass attenuation airspace opacities in a peribronchovascular distribution throughout the dependent portions of the right lower lobe, and to a lesser extent the left lower lobe. Findings suggest small volume aspiration. Scattered calcified granulomas and left infrahilar lymph node. The intracardiac blood pool is hypodense relative to the adjacent myocardium consistent with anemia. Atherosclerotic calcifications along the coronary arteries and within the aorta. Hepatobiliary: No focal liver abnormality is seen. No gallstones, gallbladder wall thickening, or biliary dilatation. Punctate calcifications consistent with old granulomatous disease. Pancreas: Unremarkable. No pancreatic ductal dilatation or surrounding inflammatory changes. Spleen: Normal in size. Punctate calcifications throughout the spleen consistent with old granulomatous disease. Adrenals/Urinary Tract: Normal adrenal glands. No hydronephrosis, or nephrolithiasis. Unremarkable ureters and bladder. Stomach/Bowel: No evidence of bowel obstruction. Surgical changes along the proximal duodenum consistent with recent surgical intervention. A surgical drain is present in the left upper quadrant. No evidence of abscess, ascites or large free air. Vascular/Lymphatic: Limited evaluation in the absence of intravenous contrast. Mild aneurysmal dilation of the infrarenal abdominal aorta with a maximal diameter of 3.6 cm. No suspicious lymphadenopathy. Reproductive: Prostate is unremarkable. Other: No abdominal wall hernia or abnormality. No abdominopelvic ascites. Musculoskeletal: Chronic bilateral L5 pars defects with mild grade 1 anterolisthesis of L5 on S1.  IMPRESSION: 1. Interval surgical changes of exploratory laparotomy, repair of bleeding duodenal ulcer and omental patch without evidence of complication (no free air, ascites, or abscess). 2. Well-positioned surgical drain in the right upper quadrant. 3. New patchy ground-glass airspace opacities in the right greater than left lower lobes concerning for low volume aspiration. 4. Aortic and coronary artery atherosclerotic vascular calcifications. 5. Evidence of old granulomatous disease affecting the spleen, liver, lungs and mediastinal lymph nodes. 6. Chronic bilateral L5 pars defects with associated mild grade 1 anterolisthesis of L5 on S1. 7. Infrarenal abdominal aortic aneurysm again noted. Maximal diameter 3.6 cm, no change compared to recent prior imaging from 05/19/2024. Agree with  prior follow-up recommendations. Electronically Signed   By: Wilkie Lent M.D.   On: 05/24/2024 12:56   DG CHEST PORT 1 VIEW Result Date: 05/24/2024 CLINICAL DATA:  Leukocytosis 100030 EXAM: PORTABLE CHEST 1 VIEW COMPARISON:  05/20/2024 FINDINGS: Endotracheal tube and nasogastric tube have been removed. No focal airspace disease. No focal lung consolidation. No pulmonary edema. Again noted are calcifications in the left mediastinum/hilar region. Severe degenerative changes involving the right shoulder. Heart size is within normal limits and stable. Trachea is midline. IMPRESSION: 1. No acute cardiopulmonary disease. 2. Endotracheal tube and nasogastric tube have been removed. Electronically Signed   By: Juliene Balder M.D.   On: 05/24/2024 08:32   DG UGI W SINGLE CM (SOL OR THIN BA) Result Date: 05/23/2024 CLINICAL DATA:  Recent bleeding duodenal ulcer. Status post exploratory laparotomy and oversew of the gastroduodenal artery performed May 20, 2024. Request for upper GI series to evaluate for leak. EXAM: DG UGI W SINGLE CM TECHNIQUE: Scout radiograph was obtained. Single contrast examination was performed using  water -soluble contrast. This exam was performed by Sari Lamp, PA-C, and was supervised and interpreted by Dr. Rockey Kilts. FLUOROSCOPY: Radiation Exposure Index (as provided by the fluoroscopic device): 23.3 mGy Kerma COMPARISON:  None Available. FINDINGS: Scout Radiograph: Nonobstructive bowel gas pattern. Surgical clips project over the proximal duodenum. Midline laparotomy staples. Nasogastric tube terminating in the proximal stomach with side port just below the gastroesophageal junction. Stomach: Normal appearance.  No mucosal lesion seen. Gastric emptying: Normal emptying without obstruction. Duodenum: Irregularity of the proximal duodenal lumen likely represents the site of treated ulcer and/or postoperative edema. Example on series 11. There is no extravasation of contrast seen to indicate leak. Other:  None. IMPRESSION: No contrast extravasation to suggest leak. Electronically Signed   By: Rockey Kilts M.D.   On: 05/23/2024 14:46   DG Chest Port 1 View Result Date: 05/20/2024 CLINICAL DATA:  [id number] Endotracheally intubated [id number] EXAM: PORTABLE CHEST - 1 VIEW COMPARISON:  May 19, 2024 FINDINGS: Esophagogastric tube terminates in the mid trachea. Esophagogastric tube courses below the diaphragm terminating outside the field of view. The last side hole is at the gastric cardia. No focal airspace consolidation, pleural effusion, or pneumothorax. No cardiomegaly. No acute fracture or destructive lesions. Multilevel thoracic osteophytosis. IMPRESSION: Well-positioned endotracheal tube. Esophagogastric tube courses below the diaphragm with the distal tip not included in the field of view. Electronically Signed   By: Rogelia Myers M.D.   On: 05/20/2024 18:50   CT ANGIO GI BLEED Result Date: 05/19/2024 CLINICAL DATA:  Severe anemia refractory to transfusion. History of Melena. EXAM: CTA ABDOMEN AND PELVIS WITHOUT AND WITH CONTRAST TECHNIQUE: Initially, noncontrast CT images of the abdomen and pelvis  were performed. Subsequently, Multidetector CT imaging of the abdomen and pelvis was performed using the standard protocol during bolus administration of intravenous contrast. Multiplanar reconstructed images and MIPs were obtained and reviewed to evaluate the vascular anatomy. RADIATION DOSE REDUCTION: This exam was performed according to the departmental dose-optimization program which includes automated exposure control, adjustment of the mA and/or kV according to patient size and/or use of iterative reconstruction technique. CONTRAST:  OMNIPAQUE  IOHEXOL  350 MG/ML SOLN COMPARISON:  April 06, 2024 FINDINGS: The study is significantly degraded by beam hardening artifact from the patient's arm positioning. VASCULAR Aorta: No intramural hematoma or aortic dissection. Fusiform infrarenal aortic aneurysm measuring 3.3 cm. Diffuse calcified and noncalcified atherosclerosis throughout the abdominal aorta. No hemodynamically significant stenosis. Celiac: Patent without acute thrombus, aneurysm, or  dissection.No hemodynamically significant stenosis. SMA: Patent without acute thrombus, aneurysm, or dissection.No hemodynamically significant stenosis. Renals: Patent without acute thrombus, aneurysm, or dissection.Mild stenosis of both renal artery ostia from calcified plaque. IMA: Patent without acute thrombus, aneurysm, or dissection.No hemodynamically significant stenosis. Inflow: Patent without acute thrombus, aneurysm, or dissection.Diffuse calcified atherosclerosis throughout the inflow vessels without hemodynamically significant stenosis. Proximal Outflow: The bilateral common femoral and visualized portions of the superficial and profunda femoral arteries are patent without acute thrombus, aneurysm, or dissection.No hemodynamically significant stenosis. Veins: No acute abnormality within the opacified veins. Review of the MIP images confirms the above findings. NON-VASCULAR Lower chest: No focal airspace  consolidation or pleural effusion.Small pericardial effusion. Multifocal right coronary artery calcified atherosclerosis. Hepatobiliary: No mass.Decompressed gallbladder without radiopaque stones or wall thickening.No intrahepatic or extrahepatic biliary ductal dilation. Pancreas: No mass or main ductal dilation.No peripancreatic inflammation or fluid collection. Spleen: Normal size. No mass. Adrenals/Urinary Tract: No adrenal masses. A couple of subcentimeter hypodensities are noted in the kidneys, too small to definitively characterize, but likely small cysts. No hydronephrosis or nephrolithiasis. The urinary bladder is distended without focal abnormality. Stomach/Bowel: The stomach is decompressed without focal abnormality. There is a fluid-filled outpouching along the first portion of the duodenum measuring 1.9 cm (axial 26), which comes in close approximation with the GDA (axial 64-68). No small bowel wall thickening or inflammation. No small bowel obstruction.Normal appendix. GI Bleed: No extravasation of contrast to suggest active GI bleeding. Lymphatic: No intraabdominal or pelvic lymphadenopathy. Reproductive: Mild prostatomegaly.No free pelvic fluid. Other: No pneumoperitoneum, ascites, or mesenteric inflammation. Musculoskeletal: No acute fracture or destructive lesion.Osteopenia. Multilevel degenerative disc disease of the spine. 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 IMPRESSION: VASCULAR Fusiform infrarenal aortic aneurysm measuring 3.3 cm. No aortic dissection or intramural hematoma. NON-VASCULAR Focal outpouching within the first portion of the duodenum (axial 26), measuring 1.9 cm. This could represent a large ulcer or duodenum diverticulum. Of note, the GDA comes in close approximation with the wall and possibly the mucosal surface (coronal 40), and could represent a source of occult GI bleeding. No findings of  active bleeding, at this time. Nonemergent upper endoscopy should be considered. These results will be called to the ordering clinician or representative by the Radiologist Assistant and communication documented in the PACS or [employer]. Aortic Atherosclerosis (ICD10-I70.0). Aortic aneurysm NOS (ICD10-I71.9). Electronically Signed   By: Rogelia Myers M.D.   On: 05/19/2024 14:49   DG CHEST PORT 1 VIEW Result Date: 05/19/2024 CLINICAL DATA:  Acute respiratory failure with hypoxia. EXAM: PORTABLE CHEST 1 VIEW COMPARISON:  04/05/2024 FINDINGS: The lungs are clear without focal pneumonia, edema, pneumothorax or pleural effusion. Interstitial markings are diffusely coarsened with chronic features. The cardiopericardial silhouette is within normal limits for size. No acute bony abnormality. Degenerative changes noted right shoulder. Telemetry leads overlie the chest. IMPRESSION: Chronic interstitial coarsening without acute cardiopulmonary findings. Electronically Signed   By: Camellia Candle M.D.   On: 05/19/2024 07:14    Microbiology: Results for orders placed or performed during the hospital encounter of 05/18/24  MRSA Next Gen by PCR, Nasal     Status: None   Collection Time: 05/18/24  7:50 AM   Specimen: Nasal Mucosa; Nasal Swab  Result Value Ref Range Status   MRSA by PCR Next Gen NOT DETECTED NOT DETECTED Final    Comment: (NOTE) The GeneXpert MRSA Assay (FDA approved for NASAL specimens only), is one component of a comprehensive  MRSA colonization surveillance program. It is not intended to diagnose MRSA infection nor to guide or monitor treatment for MRSA infections. Test performance is not FDA approved in patients less than 28 years old. Performed at [facility], [address], [place], [place] [address]   MRSA Next Gen by PCR, Nasal     Status: None   Collection Time: 05/20/24  7:42 PM   Specimen: Nasal Mucosa; Nasal Swab  Result Value Ref Range Status   MRSA by PCR Next Gen NOT  DETECTED NOT DETECTED Final    Comment: (NOTE) The GeneXpert MRSA Assay (FDA approved for NASAL specimens only), is one component of a comprehensive MRSA colonization surveillance program. It is not intended to diagnose MRSA infection nor to guide or monitor treatment for MRSA infections. Test performance is not FDA approved in patients less than 22 years old. Performed at [facility], [address], [place], [place] [address]   Culture, blood (Routine X 2) w Reflex to ID Panel     Status: None (Preliminary result)   Collection Time: 05/23/24  9:20 PM   Specimen: BLOOD  Result Value Ref Range Status   Specimen Description BLOOD SITE NOT SPECIFIED  Final   Special Requests   Final    BOTTLES DRAWN AEROBIC ONLY Blood Culture results may not be optimal due to an inadequate volume of blood received in culture bottles   Culture   Final    NO GROWTH 2 DAYS Performed at [facility], [address], [place], [place] [address]    Report Status PENDING  Incomplete  Culture, blood (Routine X 2) w Reflex to ID Panel     Status: None (Preliminary result)   Collection Time: 05/23/24  9:33 PM   Specimen: BLOOD LEFT HAND  Result Value Ref Range Status   Specimen Description BLOOD LEFT HAND  Final   Special Requests   Final    BOTTLES DRAWN AEROBIC AND ANAEROBIC Blood Culture adequate volume   Culture   Final    NO GROWTH 2 DAYS Performed at [facility], [address] [place], [place] [address]    Report Status PENDING  Incomplete    Labs: CBC: Recent Labs  Lab 05/24/24 1146 05/24/24 1621 05/24/24 1818 05/24/24 2138 05/25/24 0541 05/25/24 0814 05/25/24 1411 05/25/24 1703 05/26/24 0812  WBC 26.4*  --  28.2*  --  17.7* 18.3*  --   --  14.3*  NEUTROABS 23.2*  --  24.8*  --  15.0* 16.0*  --   --  12.0*  HGB 7.5*   < > 7.8*   < > 7.2* 7.5* 7.5* 7.7* 7.7*  HCT 23.6*   < > 24.8*   < > 22.5* 23.4* 23.6* 24.2* 24.5*  MCV 94.4  --  96.1  --  93.4 94.4  --   --  94.6   PLT 363  --  399  --  387 422*  --   --  507*   < > = values in this interval not displayed.   Basic Metabolic Panel: Recent Labs  Lab 05/20/24 2025 05/21/24 0255 05/22/24 0200 05/23/24 0040 05/23/24 1120 05/24/24 1146 05/25/24 0541  NA 137 135 140 137  --  135 135  K 4.9 5.0 4.0 4.0  --  3.7 3.5  CL 105 105 106 104  --  100 101  CO2 24 21* 23 21*  --  24 26  GLUCOSE 134* 109* 112* 94  --  94 104*  BUN 53* 53* 50* 34*  --  24* 19  CREATININE 1.73* 1.94* 1.79* 1.24  --  1.12 0.96  CALCIUM  7.4* 7.5* 7.4* 7.4*  --  8.0* 7.9*  MG 1.7 1.7  --  2.2  --  1.8 1.6*  PHOS 5.4* 5.9*  --   --  3.3 2.9 3.0   Liver Function Tests: Recent Labs  Lab 05/20/24 2025 05/23/24 1120 05/24/24 1146 05/25/24 0541  AST 16 16 21 19   ALT 13 9 10 11   ALKPHOS 41 43 71 55  BILITOT 0.5 0.6 0.6 0.6  PROT 3.3* 4.6* 4.5* 4.0*  ALBUMIN  1.8* <1.5* 1.9* 1.6*   CBG: Recent Labs  Lab 05/23/24 2324 05/24/24 0345 05/24/24 0744 05/24/24 1131 05/24/24 1522  GLUCAP 90 96 148* 106* 100*    Discharge time spent: 36 minutes.  Signed: Concepcion Riser, MD [facility] 05/26/2024

## 2024-05-26 NOTE — Progress Notes (Signed)
 Mobility Specialist Progress Note;   05/26/24 0936  Mobility  Activity Ambulated with assistance  Level of Assistance Contact guard assist, steadying assist  Assistive Device Front wheel walker  Distance Ambulated (ft) 400 ft  Activity Response Tolerated well  Mobility Referral Yes  Mobility visit 1 Mobility  Mobility Specialist Start Time (ACUTE ONLY) [id number]  Mobility Specialist Stop Time (ACUTE ONLY) 0948  Mobility Specialist Time Calculation (min) (ACUTE ONLY) 12 min   Pt in chair upon arrival, eager for mobility. Required light MinG assistance during ambulation for safety. Took 3x standing rest breaks d/t bilateral knee pain and stiffness. HR up to 101 bpm w/ activity. Pt returned to chair and left with all needs met.   Lauraine Erm Mobility Specialist Please contact via SecureChat or [employer] [phone]

## 2024-05-26 NOTE — Progress Notes (Signed)
 6 Days Post-Op   Subjective/Chief Complaint: Tolerating solid diet.  Had more BMs yesterday that were greenish in color.  No significant pain.  Mobilizing.   Objective: Vital signs in last 24 hours: Temp:  [97.6 F (36.4 C)-99.2 F (37.3 C)] 98.5 F (36.9 C) (08/14 0732) Pulse Rate:  [64-90] 90 (08/14 0732) Resp:  [16-20] 18 (08/14 0732) BP: (104-131)/(57-65) 116/58 (08/14 0732) SpO2:  [95 %-99 %] 97 % (08/14 0732) Last BM Date : 05/24/24  Intake/Output from previous day: 08/13 0701 - 08/14 0700 In: 1440 [P.O.:1440] Out: 1450 [Urine:1350; Drains:100] Intake/Output this shift: Total I/O In: 240 [P.O.:240] Out: -   PE: Gen: NAD, laying in bed Abd: soft, minimally tender, midline incision is c/d/I with staples present and honeycomb.  JP with serosang output.  Lab Results:  Recent Labs    05/25/24 0541 05/25/24 0814 05/25/24 1411 05/25/24 1703  WBC 17.7* 18.3*  --   --   HGB 7.2* 7.5* 7.5* 7.7*  HCT 22.5* 23.4* 23.6* 24.2*  PLT 387 422*  --   --    BMET Recent Labs    05/24/24 1146 05/25/24 0541  NA 135 135  K 3.7 3.5  CL 100 101  CO2 24 26  GLUCOSE 94 104*  BUN 24* 19  CREATININE 1.12 0.96  CALCIUM  8.0* 7.9*   PT/INR No results for input(s): LABPROT, INR in the last 72 hours.  ABG No results for input(s): PHART, HCO3 in the last 72 hours.  Invalid input(s): PCO2, PO2   Studies/Results: CT ABDOMEN PELVIS WO CONTRAST Result Date: 05/24/2024 CLINICAL DATA:  Postoperative abdominal pain status post laparotomy EXAM: CT ABDOMEN AND PELVIS WITHOUT CONTRAST TECHNIQUE: Multidetector CT imaging of the abdomen and pelvis was performed following the standard protocol without IV contrast. RADIATION DOSE REDUCTION: This exam was performed according to the departmental dose-optimization program which includes automated exposure control, adjustment of the mA and/or kV according to patient size and/or use of iterative reconstruction technique. COMPARISON:   CT a abdomen and pelvis 05/19/2024 FINDINGS: Lower chest: New patchy ground-glass attenuation airspace opacities in a peribronchovascular distribution throughout the dependent portions of the right lower lobe, and to a lesser extent the left lower lobe. Findings suggest small volume aspiration. Scattered calcified granulomas and left infrahilar lymph node. The intracardiac blood pool is hypodense relative to the adjacent myocardium consistent with anemia. Atherosclerotic calcifications along the coronary arteries and within the aorta. Hepatobiliary: No focal liver abnormality is seen. No gallstones, gallbladder wall thickening, or biliary dilatation. Punctate calcifications consistent with old granulomatous disease. Pancreas: Unremarkable. No pancreatic ductal dilatation or surrounding inflammatory changes. Spleen: Normal in size. Punctate calcifications throughout the spleen consistent with old granulomatous disease. Adrenals/Urinary Tract: Normal adrenal glands. No hydronephrosis, or nephrolithiasis. Unremarkable ureters and bladder. Stomach/Bowel: No evidence of bowel obstruction. Surgical changes along the proximal duodenum consistent with recent surgical intervention. A surgical drain is present in the left upper quadrant. No evidence of abscess, ascites or large free air. Vascular/Lymphatic: Limited evaluation in the absence of intravenous contrast. Mild aneurysmal dilation of the infrarenal abdominal aorta with a maximal diameter of 3.6 cm. No suspicious lymphadenopathy. Reproductive: Prostate is unremarkable. Other: No abdominal wall hernia or abnormality. No abdominopelvic ascites. Musculoskeletal: Chronic bilateral L5 pars defects with mild grade 1 anterolisthesis of L5 on S1. IMPRESSION: 1. Interval surgical changes of exploratory laparotomy, repair of bleeding duodenal ulcer and omental patch without evidence of complication (no free air, ascites, or abscess). 2. Well-positioned surgical drain in the  right upper quadrant. 3. New patchy ground-glass airspace opacities in the right greater than left lower lobes concerning for low volume aspiration. 4. Aortic and coronary artery atherosclerotic vascular calcifications. 5. Evidence of old granulomatous disease affecting the spleen, liver, lungs and mediastinal lymph nodes. 6. Chronic bilateral L5 pars defects with associated mild grade 1 anterolisthesis of L5 on S1. 7. Infrarenal abdominal aortic aneurysm again noted. Maximal diameter 3.6 cm, no change compared to recent prior imaging from 05/19/2024. Agree with prior follow-up recommendations. Electronically Signed   By: Wilkie Lent M.D.   On: 05/24/2024 12:56     Anti-infectives: Anti-infectives (From admission, onward)    Start     Dose/Rate Route Frequency Ordered Stop   05/20/24 2200  ceFAZolin  (ANCEF ) IVPB 1 g/50 mL premix        1 g 100 mL/hr over 30 Minutes Intravenous Every 8 hours 05/20/24 1938 05/22/24 1411   05/20/24 1607  ceFAZolin  (ANCEF ) 2-4 GM/100ML-% IVPB       Note to Pharmacy: Cordella Area M: cabinet override      05/20/24 1607 05/21/24 0414       Assessment/Plan: David Callahan presented to [facility] with a UGI bleed which was unable to be controlled endoscopically, so he underwent open oversew of duodenal ulcer on 05/20/24, with Dr. Evonnie.  UGI 8/11 reassuring JP drain with minimal output, likely to DC today given tolerating solid diet and no evidence of leak soft diet  Hg stable, can stop frequent H/H, CBCs given no further evidence of bleeding. Ambulating  Patient is surgically stable to DC home when medicine feels he is medically stable. Will need to follow up with primary surgeon for staple removal and normal post op follow up.  LOS: 8 days    Burnard FORBES Banter, PA-C 05/26/2024

## 2024-05-26 NOTE — Discharge Instructions (Signed)
   OPEN ABDOMINAL SURGERY: POST OP INSTRUCTIONS   A prescription for pain medication may be given to you upon discharge.  Take your pain medication as prescribed, if needed.  If narcotic pain medicine is not needed, then you may take acetaminophen  (Tylenol ) or ibuprofen (Advil) as needed. Take your usually prescribed medications unless otherwise directed. If you need a refill on your pain medication, please contact your pharmacy. They will contact our office to request authorization.  Prescriptions will not be filled after 5pm or on week-ends. You should follow a light diet the first few days after arrival home, such as soup and crackers, pudding, etc.unless your doctor has advised otherwise. A high-fiber, low fat diet can be resumed as tolerated.   Be sure to include lots of fluids daily. Most patients will experience some swelling and bruising on the chest and neck area.  Ice packs will help.  Swelling and bruising can take several days to resolve Most patients will experience some swelling and bruising in the area of the incision. Ice pack will help. Swelling and bruising can take several days to resolve..  It is common to experience some constipation if taking pain medication after surgery.  Increasing fluid intake and taking a stool softener will usually help or prevent this problem from occurring.  A mild laxative (Milk of Magnesia or Miralax ) should be taken according to package directions if there are no bowel movements after 48 hours.  You may have steri-strips (small skin tapes) in place directly over the incision.  These strips should be left on the skin for 7-10 days.  If your surgeon used skin glue on the incision, you may shower in 24 hours.  The glue will flake off over the next 2-3 weeks.  Any sutures or staples will be removed at the office during your follow-up visit. You may find that a light gauze bandage over your incision may keep your staples from being rubbed or pulled. You may  shower and replace the bandage daily. ACTIVITIES:  You may resume regular (light) daily activities beginning the next day--such as daily self-care, walking, climbing stairs--gradually increasing activities as tolerated.  You may have sexual intercourse when it is comfortable.  Refrain from any heavy lifting or straining until approved by your doctor. You may drive when you no longer are taking prescription pain medication, you can comfortably wear a seatbelt, and you can safely maneuver your car and apply brakes Return to Work: ___________________________________ David Callahan should see your doctor in the office for a follow-up appointment approximately two weeks after your surgery.  Make sure that you call for this appointment within a day or two after you arrive home to insure a convenient appointment time. OTHER INSTRUCTIONS:  _____________________________________________________________ _____________________________________________________________  WHEN TO CALL YOUR DOCTOR: Fever over 101.0 Inability to urinate Nausea and/or vomiting Extreme swelling or bruising Continued bleeding from incision. Increased pain, redness, or drainage from the incision. Difficulty swallowing or breathing Muscle cramping or spasms. Numbness or tingling in hands or feet or around lips.

## 2024-05-26 NOTE — Progress Notes (Signed)
 Physical Therapy Treatment Patient Details Name: David Callahan MRN: [id number] DOB: 1956/07/01 Today's Date: 05/26/2024   History of Present Illness Patient is a 68 yo male presenting to the ED with hematemesis and bloody stools on 05/18/24. EGD on 8/8 showing large duodenal ulcer. Ex-lap, control of bleeding duodenal ulcer, and omental patch over duodenotomy completed on 8/9. PMH includes: COPD, hypertension, PUD, RLS, anxiety.    PT Comments  Pt received in recliner, agreeable to therapy session despite c/o pain and RN notified pt requesting pain meds once due. Pt needing up to Supervision for transfers and gait with RW support, pt new RW DME arrived to room before session. Pt R lateral knee taped due to c/o soreness and pt instructed on hip Adduction seated exercises to work on muscle strengthening which may help with symptom severity, as long as it does not irritate surgical site. Pt encouraged to recline limbs in chair and chair alarm on for safety per RN request. Pt continues to benefit from PT services to progress toward functional mobility goals.     If plan is discharge home, recommend the following: A little help with walking and/or transfers;A little help with bathing/dressing/bathroom;Help with stairs or ramp for entrance;Assist for transportation   Can travel by private vehicle        Equipment Recommendations  Rolling walker (2 wheels);Other (comment) (pt asking about knee brace -may benefit from orthotist consult for hinged knee brace or OP consult? Tried KT tape today to see if it helps)    Recommendations for Other Services       Precautions / Restrictions Precautions Precautions: Fall;Other (comment) (abdominal protection precs) Recall of Precautions/Restrictions: Intact Precaution/Restrictions Comments: watch HR/BP, R JP Drain, midline incision; KT tape to lateral R knee d/t varus pain Restrictions Weight Bearing Restrictions Per Provider Order: No     Mobility  Bed  Mobility Overal bed mobility: Needs Assistance             General bed mobility comments: pt received up in chair    Transfers Overall transfer level: Needs assistance Equipment used: Rolling walker (2 wheels) Transfers: Sit to/from Stand Sit to Stand: Supervision           General transfer comment: from locked recliner<>RW    Ambulation/Gait Ambulation/Gait assistance: Supervision Gait Distance (Feet): 200 Feet Assistive device: Rolling walker (2 wheels) Gait Pattern/deviations: Step-to pattern, Antalgic, Decreased stride length, Decreased stance time - right, Trunk flexed, Decreased weight shift to right, Wide base of support       General Gait Details: Pt reports R knee>L knee pain and pt encouraged to keep hips closer within RW for using UE to offload RLE and step-to pain for RLE pain. HR 78-93 bpm with activity, SpO2 WFL on RA when signal achieved.   Stairs         General stair comments: pt defers, good recall from previous session   Wheelchair Mobility     Tilt Bed    Modified Rankin (Stroke Patients Only)       Balance Overall balance assessment: Needs assistance Sitting-balance support: Feet supported Sitting balance-Leahy Scale: Good     Standing balance support: Bilateral upper extremity supported Standing balance-Leahy Scale: Poor Standing balance comment: reliant on BUE support                            Communication Communication Communication: No apparent difficulties  Cognition Arousal: Alert Behavior During Therapy: [facility] for tasks  assessed/performed   PT - Cognitive impairments: No apparent impairments                         Following commands: Intact      Cueing Cueing Techniques: Verbal cues  Exercises Other Exercises Other Exercises: seated BLE AROM: hip ADduction pillow squeeze with 5 sec hold x2 reps for teachback, pt encouraged to perform x10 reps TID    General Comments General comments  (skin integrity, edema, etc.): VSS on RA      Pertinent Vitals/Pain Pain Assessment Pain Assessment: 0-10 Pain Score: 7  Pain Location: knees, R>L, midline incision, generalized Pain Descriptors / Indicators: Aching, Grimacing, Guarding Pain Intervention(s): Repositioned, Monitored during session, Limited activity within patient's tolerance, Patient requesting pain meds-RN notified, Other (comment) (KT tape to lateral R knee pain)    Home Living                          Prior Function            PT Goals (current goals can now be found in the care plan section) Acute Rehab PT Goals Patient Stated Goal: To go home, less R knee pain PT Goal Formulation: With patient Time For Goal Achievement: 06/06/24 Progress towards PT goals: Progressing toward goals    Frequency    Min 3X/week      PT Plan      Co-evaluation              AM-PAC PT 6 Clicks Mobility   Outcome Measure  Help needed turning from your back to your side while in a flat bed without using bedrails?: A Little Help needed moving from lying on your back to sitting on the side of a flat bed without using bedrails?: A Little Help needed moving to and from a bed to a chair (including a wheelchair)?: A Little Help needed standing up from a chair using your arms (e.g., wheelchair or bedside chair)?: A Little Help needed to walk in hospital room?: A Little Help needed climbing 3-5 steps with a railing? : A Little 6 Click Score: 18    End of Session Equipment Utilized During Treatment: Gait belt Activity Tolerance: Patient tolerated treatment well;Patient limited by pain Patient left: in chair;with call bell/phone within reach;with chair alarm set;Other (comment) (RN requests chair alarm be activated, so PTA activated alarm) Nurse Communication: Mobility status;Patient requests pain meds PT Visit Diagnosis: Other abnormalities of gait and mobility (R26.89);Muscle weakness (generalized) (M62.81)      Time: 8968-8945 PT Time Calculation (min) (ACUTE ONLY): 23 min  Charges:    $Gait Training: 8-22 mins $Therapeutic Activity: 8-22 mins PT General Charges $$ ACUTE PT VISIT: 1 Visit                     Habeeb Puertas P., PTA Acute Rehabilitation Services Secure Chat Preferred 9a-5:30pm Office: [phone]    Connell HERO [facility] 05/26/2024, 12:35 PM

## 2024-05-26 NOTE — Progress Notes (Signed)
 Patient who is AOx4 requested to have chair alarm removed even after some education about patient safety. Curtain left open and will continue to monitor.

## 2024-05-26 NOTE — Plan of Care (Signed)

## 2024-05-26 NOTE — Progress Notes (Signed)
 Went over AVS with patient, answered any/all questions, returned home meds, jp drain redressed and educated with supplies given, Iv removed, and patient was wheeled out to friends vehicle.

## 2024-05-29 LAB — CULTURE, BLOOD (ROUTINE X 2)
Culture: NO GROWTH
Culture: NO GROWTH
Special Requests: ADEQUATE

## 2024-06-02 ENCOUNTER — Ambulatory Visit (INDEPENDENT_AMBULATORY_CARE_PROVIDER_SITE_OTHER): Admitting: General Surgery

## 2024-06-02 ENCOUNTER — Ambulatory Visit (INDEPENDENT_AMBULATORY_CARE_PROVIDER_SITE_OTHER): Admitting: Gastroenterology

## 2024-06-02 ENCOUNTER — Encounter: Payer: Self-pay | Admitting: *Deleted

## 2024-06-02 ENCOUNTER — Encounter (INDEPENDENT_AMBULATORY_CARE_PROVIDER_SITE_OTHER): Payer: Self-pay | Admitting: Gastroenterology

## 2024-06-02 ENCOUNTER — Encounter: Payer: Self-pay | Admitting: General Surgery

## 2024-06-02 ENCOUNTER — Other Ambulatory Visit: Payer: Self-pay

## 2024-06-02 VITALS — BP 92/51 | HR 80 | Temp 98.1°F | Ht 70.0 in | Wt 178.8 lb

## 2024-06-02 VITALS — BP 101/54 | HR 89 | Temp 98.2°F | Resp 20 | Ht 70.0 in | Wt 179.0 lb

## 2024-06-02 DIAGNOSIS — D62 Acute posthemorrhagic anemia: Secondary | ICD-10-CM

## 2024-06-02 DIAGNOSIS — D509 Iron deficiency anemia, unspecified: Secondary | ICD-10-CM

## 2024-06-02 DIAGNOSIS — Z09 Encounter for follow-up examination after completed treatment for conditions other than malignant neoplasm: Secondary | ICD-10-CM

## 2024-06-02 DIAGNOSIS — K269 Duodenal ulcer, unspecified as acute or chronic, without hemorrhage or perforation: Secondary | ICD-10-CM | POA: Diagnosis not present

## 2024-06-02 MED ORDER — FERROUS SULFATE 325 (65 FE) MG PO TABS: 325.0000 mg | ORAL_TABLET | Freq: Every day | ORAL | 1 refills | Status: DC

## 2024-06-02 NOTE — Patient Instructions (Addendum)
 Check CBC, Iron  studies next week (around 8/28) Restart iron  daily Continue nexium  40mg  twice daily  Avoid all NSAIDs (ibuprofen, aleve , advil, naproxen , bc powders, goody powder, meloxican, mobic, voltaren) Continue to follow up with surgery  It was a pleasure to see you today. I want to create trusting relationships with patients and provide genuine, compassionate, and quality care. I truly value your feedback! please be on the lookout for a survey regarding your visit with me today. I appreciate your input about our visit and your time in completing this!    Carlin Mamone L. Nivin Braniff, MSN, APRN, AGNP-C Adult-Gerontology Nurse Practitioner [facility]

## 2024-06-02 NOTE — Progress Notes (Signed)
 Patient seen in office for staple removal to midline incision. Patient S/P EXPLORATORY LAPAROTOMY FOR CONTROL OF BLEEDING DUODENAL ULCER.  Patient noted to have # 20  staples to midline incision.  Incision healing as expected. No redness, drainage or wound dehiscence noted.   Staples removed without incident. Patient tolerated well. Steri Strips applied. Advised strips will fall off on their own.   Patient also has JP drain to right abdomen. Noted clear yellow drainage in collection bulb. No S/Sx of infection noted to drain insertion site. Patient reports that he has not seen blood in collection in >4 days. States that he discards approximately 50 mL of clear yellow fluid from collection bulb daily.   Patient is scheduled to follow up with Dr. Evonnie on Wednesday, 06/08/2024.

## 2024-06-02 NOTE — Progress Notes (Signed)
 Referring Provider: [name], NP Primary Care Physician:  [name], NP Primary GI Physician: Dr. Eartha   Chief Complaint  Patient presents with   Follow-up    Pt arrives for follow up. Pt was in [facility] 05/18/24-05/26/24. Pt received 8 units of blood. No black stools per pt.    HPI:   David Callahan is a 68 y.o. male with past medical history of HTN, asthma, COPD, PUD with recent admission for large duodenal ulcer, likely NSAID induced   Patient presenting today for:  Hospital follow up for bleeding duodenal ulcer and IDA  History:  large duodenal ulcer with visible vessel in may, though secondary to NSAID use which he reports complete cessation of.  -presented again with anemia and melena in June, subsequent EGD without findings to explain symptoms at that time  Recent admission on 05/18/24 with hemoglobin 5.3, black stools. s/p 10 units PRBC  -CTA on 8/7  Focal outpouching within the first portion of the duodenum (axial 26), measuring 1.9 cm. This could represent a large ulcer or duodenum diverticulum. Of note, the GDA comes in close approximation with the wall and possibly the mucosal surface (coronal 40), and could represent a source of occult GI bleeding. No findings of active bleeding, at this time. Nonemergent upper endoscopy should be considered.  underwent EGD 8/8 which showed large duodenal ulcer with visible vessel, active bleeding attempted with bleeding control therapy. Patient had urgent surgery consult with laparotomy with adequate hemostasis achieved. JP drain placed. Patient was transferred to [facility] ICU   Last hgb on 8/14 was 7.7   Present:  Feeling much better. Abdomen is still a bit sore. He is somewhat fatigued but feels this is getting better. He is not taking PO iron  as he ran out. He denies any rectal bleeding or melena. Appetite is a bit low but seems to be improving. No nausea or vomiting.  Home health is coming out, he has also seen physical  therapy. He is taking nexium  40mg  BID. Fatigue improving. No SOB or dizziness. No recent NSAID use, stating he has not used these since diagnosed with ulcer initially.    Last Colonoscopy: March 2025, [place], normal per patient, repeat in 10 years   Last Endoscopy: 05/20/24 as above   [facility] Weights   06/02/24 1323  Weight: 178 lb 12.8 oz (81.1 kg)     Past Medical History:  Diagnosis Date   Asthma    COPD (chronic obstructive pulmonary disease) (HCC)    Heart murmur    Hypertension    Sleep apnea     Past Surgical History:  Procedure Laterality Date   COLONOSCOPY     ESOPHAGOGASTRODUODENOSCOPY N/A 03/10/2024   Procedure: EGD (ESOPHAGOGASTRODUODENOSCOPY);  Surgeon: Cinderella Deatrice FALCON, MD;  Location: [facility];  Service: Endoscopy;  Laterality: N/A;   ESOPHAGOGASTRODUODENOSCOPY N/A 04/06/2024   Procedure: EGD (ESOPHAGOGASTRODUODENOSCOPY);  Surgeon: Shaaron Lamar HERO, MD;  Location: [facility] ENDO SUITE;  Service: Endoscopy;  Laterality: N/A;   ESOPHAGOGASTRODUODENOSCOPY N/A 05/20/2024   Procedure: EGD (ESOPHAGOGASTRODUODENOSCOPY);  Surgeon: Shaaron Lamar HERO, MD;  Location: [facility] END[facility];  Service: Endoscopy;  Laterality: N/A;   JOINT REPLACEMENT Left    knee   KNEE SURGERY     LAPAROTOMY N/A 05/20/2024   Procedure: LAPAROTOMY, EXPLORATORY, CONTROL OF BLEEDING DUODENAL ULCER;  Surgeon: Evonnie Dorothyann LABOR, DO;  Location: [facility] ORS;  Service: General;  Laterality: N/A;    Current Outpatient Medications  Medication Sig Dispense Refill   albuterol  (  VENTOLIN  HFA) 108 (90 Base) MCG/ACT inhaler Inhale 2 puffs into the lungs every 6 (six) hours as needed for shortness of breath.     atorvastatin  (LIPITOR) 40 MG tablet Take 40 mg by mouth daily.     diltiazem  (CARDIZEM  CD) 120 MG 24 hr capsule Take 1 capsule (120 mg total) by mouth daily. 30 capsule 1   esomeprazole  (NEXIUM ) 40 MG capsule Take 1 capsule (40 mg total) by mouth 2 (two) times daily before a meal. 30 capsule 3   feeding supplement,  ENSURE COMPLETE, (ENSURE COMPLETE) LIQD Take 237 mLs by mouth 2 (two) times daily between meals. 60 mL 1   HYDROcodone -acetaminophen  (NORCO) 10-325 MG tablet Take 1 tablet by mouth every 4 (four) hours as needed for moderate pain (pain score 4-6).     rOPINIRole  (REQUIP ) 1 MG tablet Take 1 mg by mouth daily.     sucralfate  (CARAFATE ) 1 GM/10ML suspension Take 10 mLs (1 g total) by mouth 4 (four) times daily -  with meals and at bedtime for 28 days. 1120 mL 0   terazosin  (HYTRIN ) 10 MG capsule Take 1 capsule (10 mg total) by mouth at bedtime.     traZODone  (DESYREL ) 150 MG tablet Take 150 mg by mouth at bedtime as needed for sleep.     umeclidinium bromide  (INCRUSE ELLIPTA ) 62.5 MCG/ACT AEPB Inhale 1 puff into the lungs daily. 30 each 0   ferrous sulfate  325 (65 FE) MG tablet Take 325 mg by mouth daily with breakfast. (Patient not taking: Reported on 06/02/2024)     No current facility-administered medications for this visit.    Allergies as of 06/02/2024 - Review Complete 06/02/2024  Allergen Reaction Noted   Motrin [ibuprofen] Anaphylaxis and Swelling 10/07/2020    Social History   Socioeconomic History   Marital status: Divorced    Spouse name: Not on file   Number of children: Not on file   Years of education: Not on file   Highest education level: Not on file  Occupational History   Not on file  Tobacco Use   Smoking status: Every Day    Current packs/day: 0.50    Types: Cigarettes, Cigars   Smokeless tobacco: Never  Vaping Use   Vaping status: Never Used  Substance and Sexual Activity   Alcohol use: Not Currently    Comment: ocassional homemade moonshine per pt   Drug use: Never   Sexual activity: Not on file  Other Topics Concern   Not on file  Social History Narrative   Not on file   Social Drivers of Health   Financial Resource Strain: Not on file  Food Insecurity: No Food Insecurity (05/18/2024)   Hunger Vital Sign    Worried About Running Out of Food in the  Last Year: Never true    Ran Out of Food in the Last Year: Never true  Transportation Needs: No Transportation Needs (05/18/2024)   PRAPARE - Administrator, Civil Service (Medical): No    Lack of Transportation (Non-Medical): No  Physical Activity: Not on file  Stress: Not on file  Social Connections: Socially Isolated (05/18/2024)   Social Connection and Isolation Panel    Frequency of Communication with Friends and Family: More than three times a week    Frequency of Social Gatherings with Friends and Family: More than three times a week    Attends Religious Services: Never    Database administrator or Organizations: No    Attends [employer]  or Organization Meetings: Never    Marital Status: Divorced    Review of systems General: negative for malaise, night sweats, fever, chills, weight loss Neck: Negative for lumps, goiter, pain and significant neck swelling Resp: Negative for cough, wheezing, dyspnea at rest CV: Negative for chest pain, leg swelling, palpitations, orthopnea GI: denies melena, hematochezia, nausea, vomiting, diarrhea, constipation, dysphagia, odyonophagia, early satiety or unintentional weight loss. +abdominal soreness  MSK: Negative for joint pain or swelling, back pain, and muscle pain. Derm: Negative for itching or rash Psych: Denies depression, anxiety, memory loss, confusion. No homicidal or suicidal ideation.  Heme: Negative for prolonged bleeding, bruising easily, and swollen nodes. Endocrine: Negative for cold or heat intolerance, polyuria, polydipsia and goiter. Neuro: negative for tremor, gait imbalance, syncope and seizures. The remainder of the review of systems is noncontributory.  Physical Exam: BP (!) 92/51   Pulse 80   Temp 98.1 F (36.7 C)   Ht 5' 10 (1.778 m)   Wt 178 lb 12.8 oz (81.1 kg)   BMI 25.66 kg/m  General:   Alert and oriented. No distress noted. Pleasant and cooperative.  Head:  Normocephalic and atraumatic. Eyes:   Conjuctiva clear without scleral icterus. Mouth:  Oral mucosa pink and moist. Good dentition. No lesions. Heart: Normal rate and rhythm, s1 and s2 heart sounds present.  Lungs: Clear lung sounds in all lobes. Respirations equal and unlabored. Abdomen:  +BS, soft, non-tender and non-distended. No rebound or guarding. No HSM or masses noted. Midline incision with honeycomb dressing, JP drain with clean site, minimal drainage presnet Derm: No palmar erythema or jaundice Msk:  Symmetrical without gross deformities. Normal posture. Extremities:  Without edema. Neurologic:  Alert and  oriented x4 Psych:  Alert and cooperative. Normal mood and affect.  Invalid input(s): 6 MONTHS   ASSESSMENT: David Callahan is a 68 y.o. male presenting today for follow up of IDA in setting of large, bleeding duodenal ulcer  Patient with history of NSAID induced PUD with admission initially in may 2025 with IDA, coffee ground emesis and melena, EGD with duodenal ulcer  with non bleedign visible vessel, grade A esophagitis. Advised to stop all NSAIDs which patient reportedly did. He presented to the hospital again in June with similar symptoms, underwent EGD again  with possible duodenal scar but no active sources of bleeding. Returned to the hospital again on 8/6 with significant IDA, dark stools, underwent CTA which did not show active bleeding but possible large duodenal ulcer, therefore underwent EGD on  8/8 with large duodenal ulcer with visible vessel, active bleeding attempted with bleeding control therapy. Patient had urgent surgery consult with laparotomy with adequate hemostasis achieved. JP drain placed. Patient was transferred to [facility] ICU. Feeling much better now. Continues to deny any NSAID use, Taking PPI daily. He ran out of iron  pills recently. Last hgb  on 8/14 as 7.7. recommend resuming iron  daily, repeat CBC and iron  studies next week. He needs to continue to avoid ALL NSAIDs. Has upcoming follow up with  surgery which he should keep.    PLAN:  Check CBC, Iron  studies next week Restart iron  daily Continue nexium  40mg  BID Avoid all NSAIDs.  Continue to follow up with surgery  All questions were answered, patient verbalized understanding and is in agreement with plan as outlined above.   Follow Up: Has F/u in October   Aariyah Sampey L. Mariette, MSN, APRN, AGNP-C Adult-Gerontology Nurse Practitioner [facility]   I have reviewed the note  and agree with the APP's assessment as described in this progress note  Given severity of bleeding, we will need to consider repeating an EGD in 2 to 3 months to reinspect the area of surgical intervention.  Toribio Fortune, MD Gastroenterology and Hepatology [facility]

## 2024-06-08 ENCOUNTER — Ambulatory Visit (INDEPENDENT_AMBULATORY_CARE_PROVIDER_SITE_OTHER): Admitting: Surgery

## 2024-06-08 ENCOUNTER — Encounter: Payer: Self-pay | Admitting: Surgery

## 2024-06-08 VITALS — BP 166/67 | HR 72 | Temp 98.2°F | Resp 18 | Ht 70.0 in | Wt 176.0 lb

## 2024-06-08 DIAGNOSIS — Z09 Encounter for follow-up examination after completed treatment for conditions other than malignant neoplasm: Secondary | ICD-10-CM

## 2024-06-08 NOTE — Progress Notes (Unsigned)
 [facility] Note   HPI:  68 y.o. Male presents to clinic for post-op follow-up status post exploratory laparotomy, control of bleeding duodenal ulcer, and omental patch over duodenotomy closure on 8/8.  Since being discharged from the hospital, the patient has been doing well.  He denies any abdominal pain.  He is tolerating a diet without nausea and vomiting.  He is having regular bowel movements.  He denies any issues with his incision site.  His skin staples have already been removed.  He still has his JP drain in place, which has 25 to 30 cc of output daily which appears serous.  He denies fevers and chills.  Review of Systems:  All other review of systems: otherwise negative   Vital Signs:  BP (!) 166/67   Pulse 72   Temp 98.2 F (36.8 C) (Oral)   Resp 18   Ht 5' 10 (1.778 m)   Wt 176 lb (79.8 kg)   SpO2 95%   BMI 25.25 kg/m    Physical Exam:  Physical Exam Vitals reviewed.  Constitutional:      Appearance: Normal appearance.  Abdominal:     Comments: Abdomen soft, nondistended, no percussion tenderness, nontender to palpation; no rigidity, guarding, rebound tenderness; midline incision well-healed, JP drain in right upper quadrant with serous output in bulb  Neurological:     Mental Status: He is alert.     Laboratory studies: None  Imaging:  None  Assessment:  68 y.o. yo Male who presents for follow-up status post exploratory laparotomy, control of bleeding duodenal ulcer, and omental patch over duodenotomy closure on 8/8  Plan:  - Patient has been doing very well since surgery.  Tolerating a diet, moving his bowels, and pain well-controlled - JP drain was removed at bedside and noted to be fully intact.  Patient tolerated removal without issue - Follow up with me as needed  All of the above recommendations were discussed with the patient, and all of patient's questions were answered to his expressed satisfaction.  Note: Portions of this  report may have been transcribed using voice recognition software. Every effort has been made to ensure accuracy; however, inadvertent computerized transcription errors may still be present.   Dorothyann Brittle, DO [facility] [address] [place], [place] [address] [phone] (office)

## 2024-06-08 NOTE — Progress Notes (Signed)
 Agree with treatment and follow-up plans.

## 2024-06-10 ENCOUNTER — Telehealth: Payer: Self-pay | Admitting: Family Medicine

## 2024-06-10 NOTE — Telephone Encounter (Signed)
 FMLA Paperwork completed and faxed to [employer], HR Manager at [phone]. Confirmation received.   Out of work starting 05/18/2024 and may return to work unrestricted on 07/18/2024.

## 2024-07-19 ENCOUNTER — Ambulatory Visit (INDEPENDENT_AMBULATORY_CARE_PROVIDER_SITE_OTHER): Admitting: Gastroenterology

## 2024-07-19 ENCOUNTER — Encounter (INDEPENDENT_AMBULATORY_CARE_PROVIDER_SITE_OTHER): Payer: Self-pay | Admitting: Gastroenterology

## 2024-07-19 VITALS — BP 127/64 | HR 93 | Temp 98.7°F | Ht 70.0 in | Wt 178.9 lb

## 2024-07-19 DIAGNOSIS — K269 Duodenal ulcer, unspecified as acute or chronic, without hemorrhage or perforation: Secondary | ICD-10-CM | POA: Diagnosis not present

## 2024-07-19 DIAGNOSIS — D509 Iron deficiency anemia, unspecified: Secondary | ICD-10-CM

## 2024-07-19 NOTE — Progress Notes (Signed)
 Referring Provider: [name], NP Primary Care Physician:  [name], NP Primary GI Physician: Dr. Eartha pica  Chief Complaint  Patient presents with   Follow-up    Patient here today for a follow up on Acute anemia. Patient says he had a recent abdominal surgery. He says the area is tender. Patient denies any dark or bloody stools. Patient says he has shortness of breath as he has COPD, also stays tired. Denies any other anemia symptoms.    HPI:   CLEON SIGNORELLI is a 68 y.o. male with past medical history of HTN, asthma, COPD, PUD with recent admission for large duodenal ulcer, likely NSAID induced   Patient presenting today for:  Follow up of IDA and duodenal ulcer  Last seen August, at that time, recent admission as outlined below, feeling much better. Some abdominal sorness. Somewhat fatigued though improving. Taking PO iron  but ran out. No rectal bleeding or melena. On nexium  40mg  BID. No NSAIDs   Recommended repeat CBC, iron  studies, restart daily iron , continue PPI BID, avoid NSAIDs, follow up with surgery as scheduled  History:  large duodenal ulcer with visible vessel in may, though secondary to NSAID use which he reports complete cessation of.  -presented again with anemia and melena in June, subsequent EGD without findings to explain symptoms at that time   Recent admission on 05/18/24 with hemoglobin 5.3, black stools. s/p 10 units PRBC  -CTA on 8/7  Focal outpouching within the first portion of the duodenum (axial 26), measuring 1.9 cm. This could represent a large ulcer or duodenum diverticulum. Of note, the GDA comes in close approximation with the wall and possibly the mucosal surface (coronal 40), and could represent a source of occult GI bleeding. No findings of active bleeding, at this time. Nonemergent upper endoscopy should be considered.   underwent EGD 8/8 which showed large duodenal ulcer with visible vessel, active bleeding attempted  with bleeding control therapy. Patient had urgent surgery consult with laparotomy with adequate hemostasis achieved. JP drain placed. Patient was transferred to [facility] ICU   Labs after last OV not completed Last hgb on 8/14 was 7.7  Present: States he is doing well. No nausea, vomiting, constipation, diarrhea. No rectal bleeding or melena. He has gained a few pounds since last visit.  Feels appetite is okay though reports some foods he just does not care for anymore. He has chronic SOB but no worse than baseline. Some fatigue still. No dizziness. Not taking iron  pills as he ran out but he also reports he is hesitant to restart these bc they make his stools dark and then he does not know if he is bleeding or not. He is taking nexium  40mg  BID. He is still taking carafate , inquires if he can stop this. He is taking colace BID which keeps his bowels moving well. Has upcoming PCP visit on 10/21. Has some continued abdominal soreness near incision but no abdominal pain.   Last Colonoscopy:March 2025, [place], normal per patient, repeat in 10 years  Last Endoscopy: 05/20/24 large duodenal ulcer with visible vessel, active bleeding attempted with bleeding control therapy.   Past Medical History:  Diagnosis Date   Asthma    COPD (chronic obstructive pulmonary disease) (HCC)    Heart murmur    Hypertension    Sleep apnea     Past Surgical History:  Procedure Laterality Date   COLONOSCOPY     ESOPHAGOGASTRODUODENOSCOPY N/A 03/10/2024   Procedure: EGD (ESOPHAGOGASTRODUODENOSCOPY);  Surgeon:  Cinderella Deatrice FALCON, MD;  Location: [facility];  Service: Endoscopy;  Laterality: N/A;   ESOPHAGOGASTRODUODENOSCOPY N/A 04/06/2024   Procedure: EGD (ESOPHAGOGASTRODUODENOSCOPY);  Surgeon: Shaaron Lamar HERO, MD;  Location: [facility];  Service: Endoscopy;  Laterality: N/A;   ESOPHAGOGASTRODUODENOSCOPY N/A 05/20/2024   Procedure: EGD (ESOPHAGOGASTRODUODENOSCOPY);  Surgeon: Shaaron Lamar HERO, MD;  Location: [facility];   Service: Endoscopy;  Laterality: N/A;   JOINT REPLACEMENT Left    knee   KNEE SURGERY     LAPAROTOMY N/A 05/20/2024   Procedure: LAPAROTOMY, EXPLORATORY, CONTROL OF BLEEDING DUODENAL ULCER;  Surgeon: Evonnie Dorothyann LABOR, DO;  Location: [facility];  Service: General;  Laterality: N/A;    Current Outpatient Medications  Medication Sig Dispense Refill   albuterol  (VENTOLIN  HFA) 108 (90 Base) MCG/ACT inhaler Inhale 2 puffs into the lungs every 6 (six) hours as needed for shortness of breath.     atorvastatin  (LIPITOR) 40 MG tablet Take 40 mg by mouth daily.     diltiazem  (CARDIZEM  CD) 120 MG 24 hr capsule Take 1 capsule (120 mg total) by mouth daily. 30 capsule 1   esomeprazole  (NEXIUM ) 40 MG capsule Take 1 capsule (40 mg total) by mouth 2 (two) times daily before a meal. 30 capsule 3   HYDROcodone -acetaminophen  (NORCO) 10-325 MG tablet Take 1 tablet by mouth every 4 (four) hours as needed for moderate pain (pain score 4-6).     rOPINIRole  (REQUIP ) 1 MG tablet Take 1 mg by mouth daily.     sucralfate  (CARAFATE ) 1 GM/10ML suspension Take 10 mLs (1 g total) by mouth 4 (four) times daily -  with meals and at bedtime for 28 days. 1120 mL 0   terazosin  (HYTRIN ) 10 MG capsule Take 1 capsule (10 mg total) by mouth at bedtime.     traZODone  (DESYREL ) 150 MG tablet Take 150 mg by mouth at bedtime as needed for sleep.     umeclidinium bromide  (INCRUSE ELLIPTA ) 62.5 MCG/ACT AEPB Inhale 1 puff into the lungs daily. 30 each 0   feeding supplement, ENSURE COMPLETE, (ENSURE COMPLETE) LIQD Take 237 mLs by mouth 2 (two) times daily between meals. (Patient not taking: Reported on 07/19/2024) 60 mL 1   ferrous sulfate  325 (65 FE) MG tablet Take 1 tablet (325 mg total) by mouth daily with breakfast. (Patient not taking: Reported on 07/19/2024) 30 tablet 1   No current facility-administered medications for this visit.    Allergies as of 07/19/2024 - Review Complete 07/19/2024  Allergen Reaction Noted   Motrin  [ibuprofen] Anaphylaxis and Swelling 10/07/2020    Social History   Socioeconomic History   Marital status: Divorced    Spouse name: Not on file   Number of children: Not on file   Years of education: Not on file   Highest education level: Not on file  Occupational History   Not on file  Tobacco Use   Smoking status: Every Day    Current packs/day: 0.50    Types: Cigarettes, Cigars   Smokeless tobacco: Never  Vaping Use   Vaping status: Never Used  Substance and Sexual Activity   Alcohol use: Not Currently    Comment: ocassional homemade moonshine per pt   Drug use: Never   Sexual activity: Not on file  Other Topics Concern   Not on file  Social History Narrative   Not on file   Social Drivers of Health   Financial Resource Strain: Not on file  Food Insecurity: No Food Insecurity (05/18/2024)  Hunger Vital Sign    Worried About Running Out of Food in the Last Year: Never true    Ran Out of Food in the Last Year: Never true  Transportation Needs: No Transportation Needs (05/18/2024)   PRAPARE - Administrator, Civil Service (Medical): No    Lack of Transportation (Non-Medical): No  Physical Activity: Not on file  Stress: Not on file  Social Connections: Socially Isolated (05/18/2024)   Social Connection and Isolation Panel    Frequency of Communication with Friends and Family: More than three times a week    Frequency of Social Gatherings with Friends and Family: More than three times a week    Attends Religious Services: Never    Database administrator or Organizations: No    Attends Engineer, structural: Never    Marital Status: Divorced    Review of systems General: negative for malaise, night sweats, fever, chills, weight loss Neck: Negative for lumps, goiter, pain and significant neck swelling Resp: Negative for cough, wheezing, dyspnea at rest CV: Negative for chest pain, leg swelling, palpitations, orthopnea GI: denies melena,  hematochezia, nausea, vomiting, diarrhea, constipation, dysphagia, odyonophagia, early satiety or unintentional weight loss.  MSK: Negative for joint pain or swelling, back pain, and muscle pain. Derm: Negative for itching or rash Psych: Denies depression, anxiety, memory loss, confusion. No homicidal or suicidal ideation.  Heme: Negative for prolonged bleeding, bruising easily, and swollen nodes. Endocrine: Negative for cold or heat intolerance, polyuria, polydipsia and goiter. Neuro: negative for tremor, gait imbalance, syncope and seizures. The remainder of the review of systems is noncontributory.  Physical Exam: BP 127/64 (BP Location: Left Arm, Patient Position: Sitting, Cuff Size: Normal)   Pulse 93   Temp 98.7 F (37.1 C) (Temporal)   Ht 5' 10 (1.778 m)   Wt 178 lb 14.4 oz (81.1 kg)   BMI 25.67 kg/m  General:   Alert and oriented. No distress noted. Pleasant and cooperative.  Head:  Normocephalic and atraumatic. Eyes:  Conjuctiva clear without scleral icterus. Mouth:  Oral mucosa pink and moist. Good dentition. No lesions. Heart: Normal rate and rhythm, s1 and s2 heart sounds present.  Lungs: Clear lung sounds in all lobes. Respirations equal and unlabored. Abdomen:  +BS, soft, non-tender and non-distended. No rebound or guarding. No HSM or masses noted. Periumbilical midline scar, well healed  Derm: No palmar erythema or jaundice Msk:  Symmetrical without gross deformities. Normal posture. Extremities:  Without edema. Neurologic:  Alert and  oriented x4 Psych:  Alert and cooperative. Normal mood and affect.  Invalid input(s): 6 MONTHS   ASSESSMENT: DONDRE CATALFAMO is a 67 y.o. male presenting today for follow up of duodenal ulcer and IDA  Patient with history of NSAID induced PUD with admission initially in may 2025 with IDA, coffee ground emesis and melena, EGD with duodenal ulcer  with non bleedign visible vessel, grade A esophagitis. Advised to stop all NSAIDs which  patient reportedly did. He presented to the hospital again in June with similar symptoms, underwent EGD again  with possible duodenal scar but no active sources of bleeding. Returned to the hospital again on 8/6 with significant IDA, dark stools, underwent CTA which did not show active bleeding but possible large duodenal ulcer, therefore underwent EGD on  8/8 with large duodenal ulcer with visible vessel, active bleeding attempted with bleeding control therapy. Patient had urgent surgery consult with laparotomy with adequate hemostasis achieved. JP drain placed.   He  is doing much better today. Has gained some weight. Has some ongoing abdominal soreness but no pain. Continues to avoid NSAIDs. No melena or rectal bleeding. No recent labs and not taking iron  pills as he ran out and concerned to resume these due to difficulty differentiating between melena and dark stools secondary to iron . We will check CBC and Iron  studies as he did not do these after last visit and plan for possible EGD next month to evaluate for healing at previous surgical site.    PLAN:  -cbc, iron  panel- will determine need to restart iron  once labs result -nexium  40mg  BID -can stop carafate  -continue to avoid ALL NSAIDs  -consider repeat EGD next month to inspect area of surgical intervention  All questions were answered, patient verbalized understanding and is in agreement with plan as outlined above.   Follow Up: 6 months   Micco Bourbeau L. Hobert Poplaski, MSN, APRN, AGNP-C Adult-Gerontology Nurse Practitioner [facility]

## 2024-07-19 NOTE — Patient Instructions (Signed)
-  let's recheck your blood counts and iron  levels, I will be in touch if we need to restart iron  pills for a short time once I have labs back  -continue nexium  40mg  twice daily  -can stop carafate   -continue to avoid ALL NSAIDs  Follow up 6 months  It was a pleasure to see you today. I want to create trusting relationships with patients and provide genuine, compassionate, and quality care. I truly value your feedback! please be on the lookout for a survey regarding your visit with me today. I appreciate your input about our visit and your time in completing this!    Tameria Patti L. Mariette, MSN, APRN, AGNP-C Adult-Gerontology Nurse Practitioner [facility]'

## 2024-07-20 ENCOUNTER — Ambulatory Visit (INDEPENDENT_AMBULATORY_CARE_PROVIDER_SITE_OTHER): Payer: Self-pay | Admitting: Gastroenterology

## 2024-07-20 ENCOUNTER — Encounter (INDEPENDENT_AMBULATORY_CARE_PROVIDER_SITE_OTHER): Payer: Self-pay

## 2024-07-20 LAB — IRON,TIBC AND FERRITIN PANEL
%SAT: 4 % — ABNORMAL LOW (ref 20–48)
Ferritin: 8 ng/mL — ABNORMAL LOW (ref 24–380)
Iron: 12 ug/dL — ABNORMAL LOW (ref 50–180)
TIBC: 320 ug/dL (ref 250–425)

## 2024-07-20 LAB — CBC
HCT: 26.4 % — ABNORMAL LOW (ref 38.5–50.0)
Hemoglobin: 7.9 g/dL — ABNORMAL LOW (ref 13.2–17.1)
MCH: 24.3 pg — ABNORMAL LOW (ref 27.0–33.0)
MCHC: 29.9 g/dL — ABNORMAL LOW (ref 32.0–36.0)
MCV: 81.2 fL (ref 80.0–100.0)
MPV: 9.5 fL (ref 7.5–12.5)
Platelets: 558 Thousand/uL — ABNORMAL HIGH (ref 140–400)
RBC: 3.25 Million/uL — ABNORMAL LOW (ref 4.20–5.80)
RDW: 16.9 % — ABNORMAL HIGH (ref 11.0–15.0)
WBC: 11.9 Thousand/uL — ABNORMAL HIGH (ref 3.8–10.8)

## 2024-07-20 NOTE — Telephone Encounter (Signed)
 ATC patient to schedule repeat EGD, no answer. LVM for call back.

## 2024-07-20 NOTE — Progress Notes (Signed)
 Referral sent, they will contact patient with apt

## 2024-07-21 ENCOUNTER — Other Ambulatory Visit (INDEPENDENT_AMBULATORY_CARE_PROVIDER_SITE_OTHER): Payer: Self-pay | Admitting: Gastroenterology

## 2024-07-21 NOTE — Telephone Encounter (Signed)
 Spoke with patient, scheduled EGD for 08/02/2024 at 2:00pm. Instructions sent to [place] messages and sending them through the mail as well.

## 2024-07-27 ENCOUNTER — Encounter (INDEPENDENT_AMBULATORY_CARE_PROVIDER_SITE_OTHER): Payer: Self-pay | Admitting: Gastroenterology

## 2024-07-29 ENCOUNTER — Encounter (HOSPITAL_COMMUNITY)
Admission: RE | Admit: 2024-07-29 | Discharge: 2024-07-29 | Disposition: A | Discharge status: Home / Self Care | Source: Ambulatory Visit | Attending: Gastroenterology | Admitting: Gastroenterology

## 2024-08-01 ENCOUNTER — Telehealth: Payer: Self-pay | Admitting: *Deleted

## 2024-08-01 NOTE — Telephone Encounter (Signed)
-----   Message from [facility] Effa Yarrow E sent at 07/29/2024 11:04 AM EDT ----- Regarding: FW: cancellation/reschedule  ----- Message ----- From: Neysa Elveria DEL Sent: 07/29/2024  11:03 AM EDT To: Madelin LITTIE Ponto, LPN; Graeme GORMAN Kitchens, CMA# Subject: RE: cancellation/reschedule                    I put it in the depot.  Let me know if I should cancel. ----- Message ----- From: Levora Randine SQUIBB, RN Sent: 07/29/2024  10:42 AM EDT To: Madelin LITTIE Ponto, LPN; Graeme GORMAN Kitchens, CMA# Subject: cancellation/reschedule                        Patient called to cancel procedure for 08/02/2024.  He has another appointment that day that he cannot miss.  He will need to be rescheduled. Thank you!

## 2024-08-02 ENCOUNTER — Encounter (HOSPITAL_COMMUNITY): Admission: RE | Payer: Self-pay | Source: Home / Self Care

## 2024-08-02 ENCOUNTER — Ambulatory Visit (HOSPITAL_COMMUNITY): Admission: RE | Admit: 2024-08-02 | Source: Home / Self Care | Admitting: Gastroenterology

## 2024-08-02 SURGERY — EGD (ESOPHAGOGASTRODUODENOSCOPY)
Anesthesia: Choice

## 2024-08-03 NOTE — Telephone Encounter (Signed)
 LMOVM to return call.

## 2024-08-22 NOTE — Progress Notes (Unsigned)
 [facility] at [facility]  HEMATOLOGY NEW VISIT  [name], NP  REASON FOR REFERRAL: Iron  deficient anemia  HISTORY OF PRESENT ILLNESS: David Callahan 68 y.o. male referred for iron  deficiency anemia. The patient is accompanied by    I have reviewed the past medical history, past surgical history, social history and family history with the patient   ALLERGIES:  is allergic to motrin [ibuprofen].  MEDICATIONS:  Current Outpatient Medications  Medication Sig Dispense Refill   albuterol  (VENTOLIN  HFA) 108 (90 Base) MCG/ACT inhaler Inhale 2 puffs into the lungs every 6 (six) hours as needed for shortness of breath.     atorvastatin  (LIPITOR) 40 MG tablet Take 40 mg by mouth daily.     diltiazem  (CARDIZEM  CD) 120 MG 24 hr capsule Take 1 capsule (120 mg total) by mouth daily. 30 capsule 1   esomeprazole  (NEXIUM ) 40 MG capsule Take 1 capsule (40 mg total) by mouth 2 (two) times daily before a meal. 30 capsule 3   feeding supplement, ENSURE COMPLETE, (ENSURE COMPLETE) LIQD Take 237 mLs by mouth 2 (two) times daily between meals. (Patient not taking: Reported on 07/19/2024) 60 mL 1   ferrous sulfate  325 (65 FE) MG EC tablet Take 325 mg by mouth 2 (two) times daily with a meal.     HYDROcodone -acetaminophen  (NORCO) 10-325 MG tablet Take 1 tablet by mouth every 4 (four) hours as needed for moderate pain (pain score 4-6).     rOPINIRole  (REQUIP ) 1 MG tablet Take 1 mg by mouth daily.     sucralfate  (CARAFATE ) 1 GM/10ML suspension Take 10 mLs (1 g total) by mouth 4 (four) times daily -  with meals and at bedtime for 28 days. 1120 mL 0   terazosin  (HYTRIN ) 10 MG capsule Take 1 capsule (10 mg total) by mouth at bedtime.     traZODone  (DESYREL ) 150 MG tablet Take 150 mg by mouth at bedtime as needed for sleep.     umeclidinium bromide  (INCRUSE ELLIPTA ) 62.5 MCG/ACT AEPB Inhale 1 puff into the lungs daily. 30 each 0   No current facility-administered medications for  this visit.     REVIEW OF SYSTEMS:   Constitutional: Denies fevers, chills or night sweats Eyes: Denies blurriness of vision Ears, nose, mouth, throat, and face: Denies mucositis or sore throat Respiratory: Denies cough, dyspnea or wheezes Cardiovascular: Denies palpitation, chest discomfort or lower extremity swelling Gastrointestinal:  Denies nausea, heartburn or change in bowel habits Skin: Denies abnormal skin rashes Lymphatics: Denies new lymphadenopathy or easy bruising Neurological:Denies numbness, tingling or new weaknesses Behavioral/Psych: Mood is stable, no new changes  All other systems were reviewed with the patient and are negative.  PHYSICAL EXAMINATION:   There were no vitals filed for this visit.  GENERAL:alert, no distress and comfortable SKIN: skin color, texture, turgor are normal, no rashes or significant lesions EYES: normal, Conjunctiva are pink and non-injected, sclera clear OROPHARYNX:no exudate, no erythema and lips, buccal mucosa, and tongue normal  NECK: supple, thyroid normal size, non-tender, without nodularity LYMPH:  no palpable lymphadenopathy in the cervical, axillary or inguinal LUNGS: clear to auscultation and percussion with normal breathing effort HEART: regular rate & rhythm and no murmurs and no lower extremity edema ABDOMEN:abdomen soft, non-tender and normal bowel sounds Musculoskeletal:no cyanosis of digits and no clubbing  NEURO: alert & oriented x 3 with fluent speech, no focal motor/sensory deficits  LABORATORY DATA:  I have reviewed the data as listed  Lab Results  Component Value Date   WBC 11.9 (H) 07/19/2024   NEUTROABS 12.0 (H) 05/26/2024   HGB 7.9 (L) 07/19/2024   HCT 26.4 (L) 07/19/2024   MCV 81.2 07/19/2024   PLT 558 (H) 07/19/2024      Chemistry      Component Value Date/Time   NA 135 05/25/2024 0541   K 3.5 05/25/2024 0541   CL 101 05/25/2024 0541   CO2 26 05/25/2024 0541   BUN 19 05/25/2024 0541    CREATININE 0.96 05/25/2024 0541      Component Value Date/Time   CALCIUM  7.9 (L) 05/25/2024 0541   ALKPHOS 55 05/25/2024 0541   AST 19 05/25/2024 0541   ALT 11 05/25/2024 0541   BILITOT 0.6 05/25/2024 0541      Latest Reference Range & Units 07/19/24 15:28  Iron  50 - 180 mcg/dL 12 (L)  TIBC 749 - 574 mcg/dL (calc) 679  %SAT 20 - 48 % (calc) 4 (L)  Ferritin 24 - 380 ng/mL 8 (L)  (L): Data is abnormally low  05/20/2024: Upper GI endoscopy:  - Normal esophagus  - Normal stomach  - Large duodenal ulcer with visible vessel questionable active bleeding with attempted bleeding control therapy  04/06/2024: Upper GI endoscopy: - Normal esophagus.  - Normal stomach. 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   03/10/2024: Upper GI endoscopy: - LA Grade A esophagitis with no bleeding.  - Gastritis.  - Non- obstructing large duodenal bulb ulcer with a nonbleeding visible vessel (Forrest ClassIIa) . NSAID induced etiology. Injected with epinephrine  in 4 quadrant . Treated with bipolar cautery ( Gold Probe)  RADIOGRAPHIC STUDIES: I have personally reviewed the radiological images as listed and agreed with the findings in the report.  CT ABDOMEN PELVIS WO CONTRAST CLINICAL DATA:  Postoperative abdominal pain status post laparotomy  EXAM: CT ABDOMEN AND PELVIS WITHOUT CONTRAST  TECHNIQUE: Multidetector CT imaging of the abdomen and pelvis was performed following the standard protocol without IV contrast.  RADIATION DOSE REDUCTION: This exam was performed according to the departmental dose-optimization program which includes automated exposure control, adjustment of the mA and/or kV according to patient size and/or use of iterative reconstruction technique.  COMPARISON:  CT a abdomen and pelvis 05/19/2024  FINDINGS: Lower chest: New patchy ground-glass attenuation airspace opacities in a peribronchovascular distribution throughout the dependent portions of the right lower lobe, and  to a lesser extent the left lower lobe. Findings suggest small volume aspiration. Scattered calcified granulomas and left infrahilar lymph node. The intracardiac blood pool is hypodense relative to the adjacent myocardium consistent with anemia. Atherosclerotic calcifications along the coronary arteries and within the aorta.  Hepatobiliary: No focal liver abnormality is seen. No gallstones, gallbladder wall thickening, or biliary dilatation. Punctate calcifications consistent with old granulomatous disease.  Pancreas: Unremarkable. No pancreatic ductal dilatation or surrounding inflammatory changes.  Spleen: Normal in size. Punctate calcifications throughout the spleen consistent with old granulomatous disease.  Adrenals/Urinary Tract: Normal adrenal glands. No hydronephrosis, or nephrolithiasis. Unremarkable ureters and bladder.  Stomach/Bowel: No evidence of bowel obstruction. Surgical changes along the proximal duodenum consistent with recent surgical intervention. A surgical drain is present in the left upper quadrant. No evidence of abscess, ascites or large free air.  Vascular/Lymphatic: Limited evaluation in the absence of intravenous contrast. Mild aneurysmal dilation of the infrarenal abdominal aorta with a maximal diameter of 3.6 cm. No suspicious lymphadenopathy.  Reproductive: Prostate is unremarkable.  Other: No abdominal wall hernia or abnormality. No abdominopelvic ascites.  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  IMPRESSION: 1. Interval surgical changes of exploratory laparotomy, repair of bleeding duodenal ulcer and omental patch without evidence of complication (no free air, ascites, or abscess). 2. Well-positioned surgical drain in the right upper quadrant. 3. New patchy ground-glass airspace opacities in the right greater than left lower lobes concerning for low volume aspiration. 4. Aortic and  coronary artery atherosclerotic vascular calcifications. 5. Evidence of old granulomatous disease affecting the spleen, liver, lungs and mediastinal lymph nodes. 6. Chronic bilateral L5 pars defects with associated mild grade 1 anterolisthesis of L5 on S1. 7. Infrarenal abdominal aortic aneurysm again noted. Maximal diameter 3.6 cm, no change compared to recent prior imaging from 05/19/2024. Agree with prior follow-up recommendations.  Electronically Signed   By: Wilkie Lent M.D.   On: 05/24/2024 12:56 DG CHEST PORT 1 VIEW CLINICAL DATA:  Leukocytosis 100030  EXAM: PORTABLE CHEST 1 VIEW  COMPARISON:  05/20/2024  FINDINGS: Endotracheal tube and nasogastric tube have been removed. No focal airspace disease. No focal lung consolidation. No pulmonary edema. Again noted are calcifications in the left mediastinum/hilar region. Severe degenerative changes involving the right shoulder. Heart size is within normal limits and stable. Trachea is midline.  IMPRESSION: 1. No acute cardiopulmonary disease. 2. Endotracheal tube and nasogastric tube have been removed.  Electronically Signed   By: Juliene Balder M.D.   On: 05/24/2024 08:32   ASSESSMENT & PLAN:  Patient is a 68 y.o. male referred for iron  deficiency anemia  Iron  deficiency anemia The most likely cause of his anemia is due to ***chronic blood loss/malabsorption syndrome. Lab Results  Component Value Date   IRON  12 (L) 07/19/2024   TIBC 320 07/19/2024   FERRITIN 8 (L) 07/19/2024    Last colonoscopy/endoscopy: ***   -We discussed some of the risks, benefits, and alternatives of intravenous iron  infusions. The patient is symptomatic from anemia and the iron  level is critically low. He tolerated oral iron  supplement poorly and desires to achieve higher levels of iron  faster for adequate hematopoesis. Some of the side-effects to be expected including risks of infusion reactions, phlebitis, headaches, nausea and fatigue.   The patient is willing to proceed. Patient education material was dispensed. Goal is to keep ferritin level greater than 50 and resolution of anemia -***Start/continue oral iron  every other day.  Use MiraLAX  for constipation  Return to clinic in 6 weeks with labs to assess response to IV iron    Thrombocytosis Likely secondary to iron  deficiency anemia  - Iron  replacement protocol as above - Continue to monitor for now  Assessment & Plan    No orders of the defined types were placed in this encounter.   The total time spent in the appointment was {CHL ONC TIME VISIT - [id number]} encounter with patients including review of chart and various tests results, discussions about plan of care and coordination of care plan   All questions were answered. The patient knows to call the clinic with any problems, questions or concerns. No barriers to learning was detected.   Mickiel Dry, MD 11/10/202510:07 PM

## 2024-08-23 ENCOUNTER — Inpatient Hospital Stay

## 2024-08-23 ENCOUNTER — Inpatient Hospital Stay: Attending: Oncology | Admitting: Oncology

## 2024-08-23 VITALS — BP 188/85 | HR 70 | Temp 97.3°F | Resp 18 | Ht 70.0 in | Wt 183.8 lb

## 2024-08-23 DIAGNOSIS — D509 Iron deficiency anemia, unspecified: Secondary | ICD-10-CM | POA: Insufficient documentation

## 2024-08-23 DIAGNOSIS — D72829 Elevated white blood cell count, unspecified: Secondary | ICD-10-CM | POA: Diagnosis not present

## 2024-08-23 DIAGNOSIS — D75839 Thrombocytosis, unspecified: Secondary | ICD-10-CM | POA: Insufficient documentation

## 2024-08-23 DIAGNOSIS — D508 Other iron deficiency anemias: Secondary | ICD-10-CM

## 2024-08-23 DIAGNOSIS — F17210 Nicotine dependence, cigarettes, uncomplicated: Secondary | ICD-10-CM | POA: Diagnosis not present

## 2024-08-23 LAB — CBC WITH DIFFERENTIAL/PLATELET
Abs Immature Granulocytes: 0.03 K/uL (ref 0.00–0.07)
Basophils Absolute: 0.1 K/uL (ref 0.0–0.1)
Basophils Relative: 1 %
Eosinophils Absolute: 0.1 K/uL (ref 0.0–0.5)
Eosinophils Relative: 1 %
HCT: 29.2 % — ABNORMAL LOW (ref 39.0–52.0)
Hemoglobin: 8.3 g/dL — ABNORMAL LOW (ref 13.0–17.0)
Immature Granulocytes: 0 %
Lymphocytes Relative: 25 %
Lymphs Abs: 1.8 K/uL (ref 0.7–4.0)
MCH: 22.3 pg — ABNORMAL LOW (ref 26.0–34.0)
MCHC: 28.4 g/dL — ABNORMAL LOW (ref 30.0–36.0)
MCV: 78.3 fL — ABNORMAL LOW (ref 80.0–100.0)
Monocytes Absolute: 0.4 K/uL (ref 0.1–1.0)
Monocytes Relative: 6 %
Neutro Abs: 4.8 K/uL (ref 1.7–7.7)
Neutrophils Relative %: 67 %
Platelets: 362 K/uL (ref 150–400)
RBC: 3.73 MIL/uL — ABNORMAL LOW (ref 4.22–5.81)
RDW: 19 % — ABNORMAL HIGH (ref 11.5–15.5)
WBC: 7.2 K/uL (ref 4.0–10.5)
nRBC: 0 % (ref 0.0–0.2)

## 2024-08-23 LAB — FERRITIN: Ferritin: 16 ng/mL — ABNORMAL LOW (ref 24–336)

## 2024-08-23 LAB — COMPREHENSIVE METABOLIC PANEL WITH GFR
ALT: 5 U/L (ref 0–44)
AST: 18 U/L (ref 15–41)
Albumin: 4 g/dL (ref 3.5–5.0)
Alkaline Phosphatase: 127 U/L — ABNORMAL HIGH (ref 38–126)
Anion gap: 10 (ref 5–15)
BUN: 11 mg/dL (ref 8–23)
CO2: 26 mmol/L (ref 22–32)
Calcium: 8.6 mg/dL — ABNORMAL LOW (ref 8.9–10.3)
Chloride: 104 mmol/L (ref 98–111)
Creatinine, Ser: 1.09 mg/dL (ref 0.61–1.24)
GFR, Estimated: >60 mL/min (ref >60)
Glucose, Bld: 85 mg/dL (ref 70–99)
Potassium: 4.5 mmol/L (ref 3.5–5.1)
Sodium: 140 mmol/L (ref 135–145)
Total Bilirubin: 0.2 mg/dL (ref 0.0–1.2)
Total Protein: 6.7 g/dL (ref 6.5–8.1)

## 2024-08-23 LAB — IRON AND TIBC
Iron: 20 ug/dL — ABNORMAL LOW (ref 45–182)
Saturation Ratios: 6 % — ABNORMAL LOW (ref 17.9–39.5)
TIBC: 356 ug/dL (ref 250–450)
UIBC: 336 ug/dL

## 2024-08-23 LAB — FOLATE: Folate: 16.7 ng/mL (ref >5.9)

## 2024-08-23 LAB — VITAMIN B12: Vitamin B-12: 499 pg/mL (ref 180–914)

## 2024-08-23 NOTE — Patient Instructions (Signed)
 You were seen and examined today by Dr. Davonna. Dr. Davonna is a hematologist, meaning that she specializes in blood abnormalities. Dr. Davonna discussed your past medical history, family history of cancers/blood conditions and the events that led to you being here today.  You were referred to Dr. Davonna due to iron  deficiency.  We will see you back in 6 weeks. We will repeat lab work prior to this visit.   Follow-up as scheduled.

## 2024-08-24 ENCOUNTER — Inpatient Hospital Stay: Admitting: Licensed Clinical Social Worker

## 2024-08-24 DIAGNOSIS — D508 Other iron deficiency anemias: Secondary | ICD-10-CM

## 2024-08-24 NOTE — Progress Notes (Signed)
 [facility] CSW Progress Note     Interventions: CSW scheduled to contact pt regarding SDOH screen.  Pt did not answer the call.  VM left w/ contact details for CSW.       Follow Up Plan:  Patient will contact CSW with any support or resource needs    Devere JONELLE Manna, LCSW Clinical Social Worker [facility]

## 2024-08-29 ENCOUNTER — Inpatient Hospital Stay

## 2024-08-30 ENCOUNTER — Inpatient Hospital Stay

## 2024-08-30 VITALS — BP 129/68 | HR 78 | Temp 97.6°F | Resp 18

## 2024-08-30 DIAGNOSIS — D62 Acute posthemorrhagic anemia: Secondary | ICD-10-CM

## 2024-08-30 DIAGNOSIS — D509 Iron deficiency anemia, unspecified: Secondary | ICD-10-CM | POA: Diagnosis not present

## 2024-08-30 MED ORDER — SODIUM CHLORIDE 0.9 % IV SOLN: INTRAVENOUS | Status: DC

## 2024-08-30 MED ORDER — ACETAMINOPHEN 325 MG PO TABS
650.0000 mg | ORAL_TABLET | Freq: Once | ORAL | Status: AC
  Administered 2024-08-30: 650 mg via ORAL
  Filled 2024-08-30: qty 2

## 2024-08-30 MED ORDER — CETIRIZINE HCL 10 MG PO TABS
10.0000 mg | ORAL_TABLET | Freq: Once | ORAL | Status: AC
  Administered 2024-08-30: 10 mg via ORAL
  Filled 2024-08-30: qty 1

## 2024-08-30 MED ORDER — SODIUM CHLORIDE 0.9 % IV SOLN
1000.0000 mg | Freq: Once | INTRAVENOUS | Status: AC
  Administered 2024-08-30: 1000 mg via INTRAVENOUS
  Filled 2024-08-30: qty 10

## 2024-08-30 NOTE — Progress Notes (Signed)
 Patient tolerated iron infusion with no complaints voiced.  Peripheral IV site clean and dry with good blood return noted before and after infusion.  Band aid applied.  VSS with discharge and left in satisfactory condition with no s/s of distress noted.

## 2024-08-30 NOTE — Patient Instructions (Signed)

## 2024-09-27 ENCOUNTER — Inpatient Hospital Stay

## 2024-09-29 ENCOUNTER — Inpatient Hospital Stay: Attending: Oncology

## 2024-09-29 DIAGNOSIS — D509 Iron deficiency anemia, unspecified: Secondary | ICD-10-CM | POA: Insufficient documentation

## 2024-09-29 DIAGNOSIS — E538 Deficiency of other specified B group vitamins: Secondary | ICD-10-CM | POA: Insufficient documentation

## 2024-09-29 DIAGNOSIS — Z79899 Other long term (current) drug therapy: Secondary | ICD-10-CM | POA: Diagnosis not present

## 2024-09-29 DIAGNOSIS — D72829 Elevated white blood cell count, unspecified: Secondary | ICD-10-CM | POA: Insufficient documentation

## 2024-09-29 DIAGNOSIS — D508 Other iron deficiency anemias: Secondary | ICD-10-CM

## 2024-09-29 LAB — COMPREHENSIVE METABOLIC PANEL WITH GFR
ALT: 7 U/L (ref 0–44)
AST: 18 U/L (ref 15–41)
Albumin: 4.1 g/dL (ref 3.5–5.0)
Alkaline Phosphatase: 129 U/L — ABNORMAL HIGH (ref 38–126)
Anion gap: 12 (ref 5–15)
BUN: 13 mg/dL (ref 8–23)
CO2: 26 mmol/L (ref 22–32)
Calcium: 9.2 mg/dL (ref 8.9–10.3)
Chloride: 104 mmol/L (ref 98–111)
Creatinine, Ser: 0.91 mg/dL (ref 0.61–1.24)
GFR, Estimated: >60 mL/min (ref >60)
Glucose, Bld: 86 mg/dL (ref 70–99)
Potassium: 4.5 mmol/L (ref 3.5–5.1)
Sodium: 142 mmol/L (ref 135–145)
Total Bilirubin: 0.3 mg/dL (ref 0.0–1.2)
Total Protein: 7 g/dL (ref 6.5–8.1)

## 2024-09-29 LAB — CBC WITH DIFFERENTIAL/PLATELET
Abs Immature Granulocytes: 0.03 K/uL (ref 0.00–0.07)
Basophils Absolute: 0.1 K/uL (ref 0.0–0.1)
Basophils Relative: 1 %
Eosinophils Absolute: 0.2 K/uL (ref 0.0–0.5)
Eosinophils Relative: 2 %
HCT: 39.6 % (ref 39.0–52.0)
Hemoglobin: 12 g/dL — ABNORMAL LOW (ref 13.0–17.0)
Immature Granulocytes: 0 %
Lymphocytes Relative: 28 %
Lymphs Abs: 2.5 K/uL (ref 0.7–4.0)
MCH: 26.5 pg (ref 26.0–34.0)
MCHC: 30.3 g/dL (ref 30.0–36.0)
MCV: 87.4 fL (ref 80.0–100.0)
Monocytes Absolute: 0.7 K/uL (ref 0.1–1.0)
Monocytes Relative: 8 %
Neutro Abs: 5.3 K/uL (ref 1.7–7.7)
Neutrophils Relative %: 61 %
Platelets: 330 K/uL (ref 150–400)
RBC: 4.53 MIL/uL (ref 4.22–5.81)
WBC: 8.7 K/uL (ref 4.0–10.5)
nRBC: 0 % (ref 0.0–0.2)

## 2024-09-29 LAB — IRON AND TIBC
Iron: 72 ug/dL (ref 45–182)
Saturation Ratios: 29 % (ref 17.9–39.5)
TIBC: 244 ug/dL — ABNORMAL LOW (ref 250–450)
UIBC: 172 ug/dL

## 2024-09-29 LAB — VITAMIN B12: Vitamin B-12: 371 pg/mL (ref 180–914)

## 2024-09-29 LAB — FERRITIN: Ferritin: 116 ng/mL (ref 24–336)

## 2024-09-29 LAB — FOLATE: Folate: 18 ng/mL (ref >5.9)

## 2024-10-04 ENCOUNTER — Inpatient Hospital Stay (HOSPITAL_BASED_OUTPATIENT_CLINIC_OR_DEPARTMENT_OTHER): Admitting: Oncology

## 2024-10-04 VITALS — BP 178/77 | HR 81 | Temp 97.9°F | Resp 18 | Ht 70.0 in | Wt 171.5 lb

## 2024-10-04 DIAGNOSIS — E538 Deficiency of other specified B group vitamins: Secondary | ICD-10-CM

## 2024-10-04 DIAGNOSIS — D508 Other iron deficiency anemias: Secondary | ICD-10-CM | POA: Diagnosis not present

## 2024-10-04 DIAGNOSIS — D509 Iron deficiency anemia, unspecified: Secondary | ICD-10-CM | POA: Diagnosis not present

## 2024-10-04 DIAGNOSIS — D62 Acute posthemorrhagic anemia: Secondary | ICD-10-CM

## 2024-10-04 MED ORDER — VITAMIN B-12 1000 MCG PO TABS: 1000.0000 ug | ORAL_TABLET | Freq: Every day | ORAL | 2 refills | Status: AC

## 2024-10-04 NOTE — Progress Notes (Signed)
 " [facility]  HEMATOLOGY FOLLOW-UP VISIT  [name], NP  REASON FOR FOLLOW-UP: Iron  deficiency anemia  ASSESSMENT & PLAN:  Patient is a 68 y.o. male following for Iron  deficiency anemia  Assessment and Plan  Iron  deficiency anemia Iron  deficiency likely secondary to acute blood loss.  Iron  deficiency anemia significantly improved with IV iron . Hemoglobin improved from 8.3-12.  Significant improvement in iron  levels.  - Continue oral iron  every other day - Continue to follow with GI  Return to clinic in 3 months with labs.  Thrombocytosis Likely secondary to iron  deficiency anemia Resolved at this time   Vitamin B12 deficiency Patient has mild vitamin B12 deficiency with levels less than 400  - Start oral vitamin B12 1000 mcg daily   Orders Placed This Encounter  Procedures   Ferritin    Standing Status:   Future    Expected Date:   12/26/2024    Expiration Date:   03/26/2025   Folate    Standing Status:   Future    Expected Date:   12/26/2024    Expiration Date:   03/26/2025   Vitamin B12    Standing Status:   Future    Expected Date:   12/26/2024    Expiration Date:   03/26/2025   CBC with Differential/Platelet    Standing Status:   Future    Expected Date:   12/26/2024    Expiration Date:   03/26/2025   Comprehensive metabolic panel with GFR    Standing Status:   Future    Expected Date:   12/26/2024    Expiration Date:   03/26/2025   Iron  and TIBC    Standing Status:   Future    Expected Date:   12/26/2024    Expiration Date:   03/26/2025    The total time spent in the appointment was 17 minutes encounter with patients including review of chart and various tests results, discussions about plan of care and coordination of care plan   All questions were answered. The patient knows to call the clinic with any problems, questions or concerns. No barriers to learning was detected.   LILLETTE Verneta SAUNDERS Teague,acting as a neurosurgeon  for Mickiel Dry, MD.,have documented all relevant documentation on the behalf of Mickiel Dry, MD,as directed by  Mickiel Dry, MD while in the presence of Mickiel Dry, MD.  I, Mickiel Dry MD, have reviewed the above documentation for accuracy and completeness, and I agree with the above.    Mickiel Dry, MD 12/24/20256:09 AM   SUMMARY OF HEMATOLOGIC HISTORY: -04/04/2024: HGB- 6.1.  -04/06/2024: HGB- 8.5. Ferritin 11. Iron  saturation 8%. -04/08/2024: 1 unit of blood transfused.  -05/18/2024: 4 units of blood transfused.  -05/19/2024: 4 units of blood transfused.  -05/20/2024: 4 units of blood transfused.  -05/20/2024: EGD found a large duodenal ulcer with visible bleeding requiring subsequent laparotomy to control bleeding. 2 units of blood transfused during EGD.  -05/23/2024: 1 unit of blood transfused.  -07/19/2024: HGB- 7.9. Ferritin 8. Iron  saturation 4%.  -08/23/2024: HGB- 8.3. Ferritin 16. Iron  saturation 6%. Folate normal. Vitamin B12 normal.  -08/30/2024: IV Monoferric  1,000 mg -09/29/2024: HGB- 12.0. Vitamin B 12 371. Folate and ferritin normal.   INTERVAL HISTORY: Nancyann LITTIE Haddock 68 y.o. male following for iron  deficiency anemia.   Satchel reports feeling stronger and more energized since being given IV iron . His hemoglobin has improved as well. We discussed lab results which showed vitamin B 12  deficiency. I will prescribe B 12 supplements. He denies any blood in stools, melena, weight loss, or loss of appetite. Yandriel states he avoids hot foods.    He is an active smoker for 57 years.  I have reviewed the past medical history, past surgical history, social history and family history with the patient   ALLERGIES:  is allergic to motrin [ibuprofen].  MEDICATIONS:  Current Outpatient Medications  Medication Sig Dispense Refill   albuterol  (VENTOLIN  HFA) 108 (90 Base) MCG/ACT inhaler Inhale 2 puffs into the lungs every 6 (six) hours as needed for  shortness of breath.     atorvastatin  (LIPITOR) 40 MG tablet Take 40 mg by mouth daily.     benazepril  (LOTENSIN ) 40 MG tablet Take 40 mg by mouth daily.     cyanocobalamin  (VITAMIN B12) 1000 MCG tablet Take 1 tablet (1,000 mcg total) by mouth daily. 90 tablet 2   cyclobenzaprine  (FLEXERIL ) 10 MG tablet Take 10 mg by mouth 3 (three) times daily.     diltiazem  (CARDIZEM  CD) 120 MG 24 hr capsule Take 120 mg by mouth daily.     docusate sodium  (COLACE) 100 MG capsule Take 100 mg by mouth 2 (two) times daily.     esomeprazole  (NEXIUM ) 40 MG capsule Take 1 capsule (40 mg total) by mouth 2 (two) times daily before a meal. 30 capsule 3   feeding supplement, ENSURE COMPLETE, (ENSURE COMPLETE) LIQD Take 237 mLs by mouth 2 (two) times daily between meals. 60 mL 1   FEROSUL 325 (65 Fe) MG tablet Take 325 mg by mouth every morning.     hydrochlorothiazide  (HYDRODIURIL ) 25 MG tablet TAKE ONE TABLET BY MOUTH ONCE DAILY; Duration: 30     HYDROcodone -acetaminophen  (NORCO) 10-325 MG tablet Take 1 tablet by mouth every 4 (four) hours as needed for moderate pain (pain score 4-6).     rOPINIRole  (REQUIP ) 1 MG tablet Take 1 mg by mouth daily.     terazosin  (HYTRIN ) 10 MG capsule Take 1 capsule (10 mg total) by mouth at bedtime.     traZODone  (DESYREL ) 150 MG tablet Take 150 mg by mouth at bedtime as needed for sleep.     umeclidinium bromide  (INCRUSE ELLIPTA ) 62.5 MCG/ACT AEPB Inhale 1 puff into the lungs daily. 30 each 0   sucralfate  (CARAFATE ) 1 GM/10ML suspension Take 10 mLs (1 g total) by mouth 4 (four) times daily -  with meals and at bedtime for 28 days. (Patient not taking: Reported on 10/04/2024) 1120 mL 0   No current facility-administered medications for this visit.     PHYSICAL EXAMINATION:   Vitals:   10/04/24 1423 10/04/24 1424  BP: (!) 175/91 (!) 178/77  Pulse: 81   Resp: 18   Temp: 97.9 F (36.6 C)   SpO2: 95%     GENERAL:alert, no distress and comfortable SKIN: skin color, texture,  turgor are normal, no rashes or significant lesions LYMPH:  no palpable lymphadenopathy in the cervical, axillary or inguinal LUNGS: clear to auscultation and percussion with normal breathing effort HEART: regular rate & rhythm and no murmurs and no lower extremity edema ABDOMEN:abdomen soft, non-tender and normal bowel sounds Musculoskeletal:no cyanosis of digits and no clubbing  NEURO: alert & oriented x 3 with fluent speech  LABORATORY DATA:  I have reviewed the data as listed  Lab Results  Component Value Date   WBC 8.7 09/29/2024   NEUTROABS 5.3 09/29/2024   HGB 12.0 (L) 09/29/2024   HCT 39.6 09/29/2024   MCV  87.4 09/29/2024   PLT 330 09/29/2024      Chemistry      Component Value Date/Time   NA 142 09/29/2024 0854   K 4.5 09/29/2024 0854   CL 104 09/29/2024 0854   CO2 26 09/29/2024 0854   BUN 13 09/29/2024 0854   CREATININE 0.91 09/29/2024 0854      Component Value Date/Time   CALCIUM  9.2 09/29/2024 0854   ALKPHOS 129 (H) 09/29/2024 0854   AST 18 09/29/2024 0854   ALT 7 09/29/2024 0854   BILITOT 0.3 09/29/2024 0854       Latest Reference Range & Units 09/29/24 08:54  Iron  45 - 182 ug/dL 72  UIBC ug/dL 827  TIBC 749 - 549 ug/dL 755 (L)  Saturation Ratios 17.9 - 39.5 % 29  Ferritin 24 - 336 ng/mL 116  Folate >5.9 ng/mL 18.0  Vitamin B12 180 - 914 pg/mL 371  (L): Data is abnormally low  RADIOGRAPHIC STUDIES: I have personally reviewed the radiological images as listed and agreed with the findings in the report.  CT ABDOMEN PELVIS WO CONTRAST CLINICAL DATA:  Postoperative abdominal pain status post laparotomy  EXAM: CT ABDOMEN AND PELVIS WITHOUT CONTRAST  TECHNIQUE: Multidetector CT imaging of the abdomen and pelvis was performed following the standard protocol without IV contrast.  RADIATION DOSE REDUCTION: This exam was performed according to the departmental dose-optimization program which includes automated exposure control, adjustment of the mA  and/or kV according to patient size and/or use of iterative reconstruction technique.  COMPARISON:  CT a abdomen and pelvis 05/19/2024  FINDINGS: Lower chest: New patchy ground-glass attenuation airspace opacities in a peribronchovascular distribution throughout the dependent portions of the right lower lobe, and to a lesser extent the left lower lobe. Findings suggest small volume aspiration. Scattered calcified granulomas and left infrahilar lymph node. The intracardiac blood pool is hypodense relative to the adjacent myocardium consistent with anemia. Atherosclerotic calcifications along the coronary arteries and within the aorta.  Hepatobiliary: No focal liver abnormality is seen. No gallstones, gallbladder wall thickening, or biliary dilatation. Punctate calcifications consistent with old granulomatous disease.  Pancreas: Unremarkable. No pancreatic ductal dilatation or surrounding inflammatory changes.  Spleen: Normal in size. Punctate calcifications throughout the spleen consistent with old granulomatous disease.  Adrenals/Urinary Tract: Normal adrenal glands. No hydronephrosis, or nephrolithiasis. Unremarkable ureters and bladder.  Stomach/Bowel: No evidence of bowel obstruction. Surgical changes along the proximal duodenum consistent with recent surgical intervention. A surgical drain is present in the left upper quadrant. No evidence of abscess, ascites or large free air.  Vascular/Lymphatic: Limited evaluation in the absence of intravenous contrast. Mild aneurysmal dilation of the infrarenal abdominal aorta with a maximal diameter of 3.6 cm. No suspicious lymphadenopathy.  Reproductive: Prostate is unremarkable.  Other: No abdominal wall hernia or abnormality. No abdominopelvic ascites.  Musculoskeletal: Chronic bilateral L5 pars defects with mild grade 1 anterolisthesis of L5 on S1.  IMPRESSION: 1. Interval surgical changes of exploratory laparotomy, repair  of bleeding duodenal ulcer and omental patch without evidence of complication (no free air, ascites, or abscess). 2. Well-positioned surgical drain in the right upper quadrant. 3. New patchy ground-glass airspace opacities in the right greater than left lower lobes concerning for low volume aspiration. 4. Aortic and coronary artery atherosclerotic vascular calcifications. 5. Evidence of old granulomatous disease affecting the spleen, liver, lungs and mediastinal lymph nodes. 6. Chronic bilateral L5 pars defects with associated mild grade 1 anterolisthesis of L5 on S1. 7. Infrarenal abdominal aortic aneurysm again noted.  Maximal diameter 3.6 cm, no change compared to recent prior imaging from 05/19/2024. Agree with prior follow-up recommendations.  Electronically Signed   By: Wilkie Lent M.D.   On: 05/24/2024 12:56 DG CHEST PORT 1 VIEW CLINICAL DATA:  Leukocytosis [id number]  EXAM: PORTABLE CHEST 1 VIEW  COMPARISON:  05/20/2024  FINDINGS: Endotracheal tube and nasogastric tube have been removed. No focal airspace disease. No focal lung consolidation. No pulmonary edema. Again noted are calcifications in the left mediastinum/hilar region. Severe degenerative changes involving the right shoulder. Heart size is within normal limits and stable. Trachea is midline.  IMPRESSION: 1. No acute cardiopulmonary disease. 2. Endotracheal tube and nasogastric tube have been removed.  Electronically Signed   By: Juliene Balder M.D.   On: 05/24/2024 08:32     "

## 2024-10-05 ENCOUNTER — Encounter: Payer: Self-pay | Admitting: Oncology

## 2024-10-10 ENCOUNTER — Encounter: Payer: Self-pay | Admitting: *Deleted

## 2024-10-14 ENCOUNTER — Encounter: Payer: Self-pay | Admitting: Oncology

## 2025-01-03 ENCOUNTER — Inpatient Hospital Stay

## 2025-01-10 ENCOUNTER — Inpatient Hospital Stay: Admitting: Physician Assistant
